# Patient Record
Sex: Female | Born: 1950 | Race: Black or African American | Hispanic: No | Marital: Single | Smoking: Never smoker
Health system: Southern US, Community
[De-identification: ages and names within clinical notes are randomized; demographics above are authoritative.]

## PROBLEM LIST (undated history)

## (undated) DIAGNOSIS — E7800 Pure hypercholesterolemia, unspecified: Secondary | ICD-10-CM

## (undated) DIAGNOSIS — N200 Calculus of kidney: Secondary | ICD-10-CM

## (undated) DIAGNOSIS — Z803 Family history of malignant neoplasm of breast: Secondary | ICD-10-CM

## (undated) DIAGNOSIS — Z923 Personal history of irradiation: Secondary | ICD-10-CM

## (undated) DIAGNOSIS — N390 Urinary tract infection, site not specified: Secondary | ICD-10-CM

## (undated) DIAGNOSIS — Z800 Family history of malignant neoplasm of digestive organs: Secondary | ICD-10-CM

## (undated) DIAGNOSIS — Z9221 Personal history of antineoplastic chemotherapy: Secondary | ICD-10-CM

## (undated) DIAGNOSIS — I10 Essential (primary) hypertension: Secondary | ICD-10-CM

## (undated) DIAGNOSIS — Z801 Family history of malignant neoplasm of trachea, bronchus and lung: Secondary | ICD-10-CM

## (undated) DIAGNOSIS — C801 Malignant (primary) neoplasm, unspecified: Secondary | ICD-10-CM

## (undated) HISTORY — DX: Family history of malignant neoplasm of digestive organs: Z80.0

## (undated) HISTORY — DX: Family history of malignant neoplasm of breast: Z80.3

## (undated) HISTORY — DX: Urinary tract infection, site not specified: N39.0

## (undated) HISTORY — PX: BREAST SURGERY: SHX581

## (undated) HISTORY — PX: BREAST EXCISIONAL BIOPSY: SUR124

## (undated) HISTORY — DX: Family history of malignant neoplasm of trachea, bronchus and lung: Z80.1

---

## 1998-06-04 ENCOUNTER — Other Ambulatory Visit: Admission: RE | Admit: 1998-06-04 | Discharge: 1998-06-04 | Payer: Self-pay | Admitting: Obstetrics and Gynecology

## 1999-11-04 ENCOUNTER — Encounter: Admission: RE | Admit: 1999-11-04 | Discharge: 1999-11-04 | Payer: Self-pay | Admitting: General Surgery

## 1999-11-04 ENCOUNTER — Encounter (HOSPITAL_BASED_OUTPATIENT_CLINIC_OR_DEPARTMENT_OTHER): Payer: Self-pay | Admitting: General Surgery

## 1999-11-05 ENCOUNTER — Other Ambulatory Visit: Admission: RE | Admit: 1999-11-05 | Discharge: 1999-11-05 | Payer: Self-pay | Admitting: Obstetrics & Gynecology

## 2000-11-05 ENCOUNTER — Encounter (HOSPITAL_BASED_OUTPATIENT_CLINIC_OR_DEPARTMENT_OTHER): Payer: Self-pay | Admitting: General Surgery

## 2000-11-05 ENCOUNTER — Encounter: Admission: RE | Admit: 2000-11-05 | Discharge: 2000-11-05 | Payer: Self-pay | Admitting: General Surgery

## 2000-11-18 ENCOUNTER — Other Ambulatory Visit: Admission: RE | Admit: 2000-11-18 | Discharge: 2000-11-18 | Payer: Self-pay | Admitting: Obstetrics & Gynecology

## 2001-11-09 ENCOUNTER — Encounter (HOSPITAL_BASED_OUTPATIENT_CLINIC_OR_DEPARTMENT_OTHER): Payer: Self-pay | Admitting: General Surgery

## 2001-11-09 ENCOUNTER — Encounter: Admission: RE | Admit: 2001-11-09 | Discharge: 2001-11-09 | Payer: Self-pay | Admitting: General Surgery

## 2001-11-23 ENCOUNTER — Other Ambulatory Visit: Admission: RE | Admit: 2001-11-23 | Discharge: 2001-11-23 | Payer: Self-pay | Admitting: Obstetrics & Gynecology

## 2002-11-18 ENCOUNTER — Encounter (HOSPITAL_BASED_OUTPATIENT_CLINIC_OR_DEPARTMENT_OTHER): Payer: Self-pay | Admitting: General Surgery

## 2002-11-18 ENCOUNTER — Encounter: Admission: RE | Admit: 2002-11-18 | Discharge: 2002-11-18 | Payer: Self-pay | Admitting: General Surgery

## 2002-11-25 ENCOUNTER — Other Ambulatory Visit: Admission: RE | Admit: 2002-11-25 | Discharge: 2002-11-25 | Payer: Self-pay | Admitting: Obstetrics & Gynecology

## 2003-07-14 ENCOUNTER — Encounter: Admission: RE | Admit: 2003-07-14 | Discharge: 2003-07-14 | Payer: Self-pay | Admitting: General Surgery

## 2003-08-04 ENCOUNTER — Encounter: Admission: RE | Admit: 2003-08-04 | Discharge: 2003-08-04 | Payer: Self-pay | Admitting: General Surgery

## 2003-09-01 ENCOUNTER — Encounter: Admission: RE | Admit: 2003-09-01 | Discharge: 2003-09-01 | Payer: Self-pay | Admitting: General Surgery

## 2003-09-04 ENCOUNTER — Ambulatory Visit (HOSPITAL_COMMUNITY): Admission: RE | Admit: 2003-09-04 | Discharge: 2003-09-04 | Payer: Self-pay | Admitting: General Surgery

## 2003-09-04 ENCOUNTER — Ambulatory Visit (HOSPITAL_BASED_OUTPATIENT_CLINIC_OR_DEPARTMENT_OTHER): Admission: RE | Admit: 2003-09-04 | Discharge: 2003-09-04 | Payer: Self-pay | Admitting: General Surgery

## 2003-09-04 ENCOUNTER — Encounter (INDEPENDENT_AMBULATORY_CARE_PROVIDER_SITE_OTHER): Payer: Self-pay | Admitting: *Deleted

## 2003-10-20 ENCOUNTER — Ambulatory Visit (HOSPITAL_BASED_OUTPATIENT_CLINIC_OR_DEPARTMENT_OTHER): Admission: RE | Admit: 2003-10-20 | Discharge: 2003-10-20 | Payer: Self-pay | Admitting: General Surgery

## 2003-10-20 ENCOUNTER — Encounter (INDEPENDENT_AMBULATORY_CARE_PROVIDER_SITE_OTHER): Payer: Self-pay | Admitting: *Deleted

## 2003-10-20 ENCOUNTER — Ambulatory Visit (HOSPITAL_COMMUNITY): Admission: RE | Admit: 2003-10-20 | Discharge: 2003-10-20 | Payer: Self-pay | Admitting: General Surgery

## 2003-11-28 ENCOUNTER — Other Ambulatory Visit: Admission: RE | Admit: 2003-11-28 | Discharge: 2003-11-28 | Payer: Self-pay | Admitting: Obstetrics & Gynecology

## 2004-02-29 ENCOUNTER — Encounter: Admission: RE | Admit: 2004-02-29 | Discharge: 2004-02-29 | Payer: Self-pay | Admitting: General Surgery

## 2004-11-13 ENCOUNTER — Other Ambulatory Visit: Admission: RE | Admit: 2004-11-13 | Discharge: 2004-11-13 | Payer: Self-pay | Admitting: Obstetrics & Gynecology

## 2005-03-27 ENCOUNTER — Encounter: Admission: RE | Admit: 2005-03-27 | Discharge: 2005-03-27 | Payer: Self-pay | Admitting: General Surgery

## 2006-04-14 ENCOUNTER — Encounter: Admission: RE | Admit: 2006-04-14 | Discharge: 2006-04-14 | Payer: Self-pay | Admitting: General Surgery

## 2006-11-05 ENCOUNTER — Emergency Department (HOSPITAL_COMMUNITY): Admission: EM | Admit: 2006-11-05 | Discharge: 2006-11-05 | Payer: Self-pay | Admitting: Emergency Medicine

## 2006-11-09 ENCOUNTER — Ambulatory Visit: Payer: Self-pay | Admitting: *Deleted

## 2006-11-09 ENCOUNTER — Encounter (INDEPENDENT_AMBULATORY_CARE_PROVIDER_SITE_OTHER): Payer: Self-pay | Admitting: Family Medicine

## 2006-11-09 ENCOUNTER — Ambulatory Visit: Payer: Self-pay | Admitting: Internal Medicine

## 2006-11-09 LAB — CONVERTED CEMR LAB
ALT: 27 units/L (ref 0–35)
Alkaline Phosphatase: 86 units/L (ref 39–117)
Basophils Absolute: 0.1 10*3/uL (ref 0.0–0.1)
CO2: 25 meq/L (ref 19–32)
Creatinine, Ser: 0.71 mg/dL (ref 0.40–1.20)
Eosinophils Absolute: 0.2 10*3/uL (ref 0.0–0.7)
Eosinophils Relative: 2 % (ref 0–5)
Glucose, Bld: 93 mg/dL (ref 70–99)
HCT: 40.4 % (ref 36.0–46.0)
Hemoglobin: 12.5 g/dL (ref 12.0–15.0)
Lymphocytes Relative: 42 % (ref 12–46)
MCHC: 30.9 g/dL (ref 30.0–36.0)
MCV: 88.8 fL (ref 78.0–100.0)
Monocytes Absolute: 0.5 10*3/uL (ref 0.2–0.7)
Platelets: 214 10*3/uL (ref 150–400)
RDW: 14 % (ref 11.5–14.0)
Sodium: 144 meq/L (ref 135–145)
TSH: 1.232 microintl units/mL (ref 0.350–5.50)
Total Bilirubin: 0.4 mg/dL (ref 0.3–1.2)
Total Protein: 8.1 g/dL (ref 6.0–8.3)

## 2007-04-16 ENCOUNTER — Ambulatory Visit (HOSPITAL_COMMUNITY): Admission: RE | Admit: 2007-04-16 | Discharge: 2007-04-16 | Payer: Self-pay | Admitting: Obstetrics & Gynecology

## 2007-05-11 ENCOUNTER — Encounter (INDEPENDENT_AMBULATORY_CARE_PROVIDER_SITE_OTHER): Payer: Self-pay | Admitting: Family Medicine

## 2007-05-11 ENCOUNTER — Ambulatory Visit: Payer: Self-pay | Admitting: Internal Medicine

## 2007-05-11 LAB — CONVERTED CEMR LAB
ALT: 20 units/L (ref 0–35)
CO2: 26 meq/L (ref 19–32)
Calcium: 9.8 mg/dL (ref 8.4–10.5)
Chloride: 104 meq/L (ref 96–112)
Cholesterol: 206 mg/dL — ABNORMAL HIGH (ref 0–200)
Glucose, Bld: 126 mg/dL — ABNORMAL HIGH (ref 70–99)
Sodium: 143 meq/L (ref 135–145)
Total Protein: 7.9 g/dL (ref 6.0–8.3)
Triglycerides: 204 mg/dL — ABNORMAL HIGH (ref <150)

## 2007-06-02 ENCOUNTER — Encounter (INDEPENDENT_AMBULATORY_CARE_PROVIDER_SITE_OTHER): Payer: Self-pay | Admitting: Family Medicine

## 2007-06-02 ENCOUNTER — Ambulatory Visit: Payer: Self-pay | Admitting: Internal Medicine

## 2007-06-02 LAB — CONVERTED CEMR LAB
BUN: 12 mg/dL (ref 6–23)
CO2: 24 meq/L (ref 19–32)
Chloride: 102 meq/L (ref 96–112)
Creatinine, Ser: 0.75 mg/dL (ref 0.40–1.20)
Glucose, Bld: 130 mg/dL — ABNORMAL HIGH (ref 70–99)
Potassium: 3.9 meq/L (ref 3.5–5.3)

## 2007-06-14 ENCOUNTER — Ambulatory Visit: Payer: Self-pay | Admitting: Internal Medicine

## 2007-07-26 ENCOUNTER — Encounter (INDEPENDENT_AMBULATORY_CARE_PROVIDER_SITE_OTHER): Payer: Self-pay | Admitting: Family Medicine

## 2007-07-26 ENCOUNTER — Ambulatory Visit: Payer: Self-pay | Admitting: Internal Medicine

## 2007-07-26 LAB — CONVERTED CEMR LAB
AST: 15 units/L (ref 0–37)
Albumin: 4.4 g/dL (ref 3.5–5.2)
Alkaline Phosphatase: 79 units/L (ref 39–117)
BUN: 10 mg/dL (ref 6–23)
Creatinine, Ser: 0.74 mg/dL (ref 0.40–1.20)
Glucose, Bld: 91 mg/dL (ref 70–99)
HDL: 38 mg/dL — ABNORMAL LOW (ref >39)
LDL Cholesterol: 88 mg/dL (ref 0–99)
Total Bilirubin: 0.5 mg/dL (ref 0.3–1.2)
Total CHOL/HDL Ratio: 4.3
Triglycerides: 186 mg/dL — ABNORMAL HIGH (ref <150)
VLDL: 37 mg/dL (ref 0–40)

## 2007-08-16 ENCOUNTER — Ambulatory Visit: Payer: Self-pay | Admitting: Internal Medicine

## 2007-08-16 ENCOUNTER — Encounter (INDEPENDENT_AMBULATORY_CARE_PROVIDER_SITE_OTHER): Payer: Self-pay | Admitting: Family Medicine

## 2007-08-16 LAB — CONVERTED CEMR LAB
CO2: 27 meq/L (ref 19–32)
Calcium: 9.7 mg/dL (ref 8.4–10.5)
Cholesterol: 153 mg/dL (ref 0–200)
Creatinine, Ser: 0.75 mg/dL (ref 0.40–1.20)
HDL: 41 mg/dL (ref >39)

## 2007-09-29 ENCOUNTER — Emergency Department (HOSPITAL_COMMUNITY): Admission: EM | Admit: 2007-09-29 | Discharge: 2007-09-29 | Payer: Self-pay | Admitting: Emergency Medicine

## 2007-10-15 ENCOUNTER — Ambulatory Visit: Payer: Self-pay | Admitting: Internal Medicine

## 2007-12-03 ENCOUNTER — Ambulatory Visit: Payer: Self-pay | Admitting: Family Medicine

## 2007-12-03 LAB — CONVERTED CEMR LAB
Albumin: 4.5 g/dL (ref 3.5–5.2)
BUN: 16 mg/dL (ref 6–23)
CO2: 24 meq/L (ref 19–32)
Glucose, Bld: 139 mg/dL — ABNORMAL HIGH (ref 70–99)
Sodium: 140 meq/L (ref 135–145)
Total Bilirubin: 0.3 mg/dL (ref 0.3–1.2)
Total Protein: 7.5 g/dL (ref 6.0–8.3)

## 2008-01-25 ENCOUNTER — Emergency Department (HOSPITAL_COMMUNITY): Admission: EM | Admit: 2008-01-25 | Discharge: 2008-01-25 | Payer: Self-pay | Admitting: Emergency Medicine

## 2008-04-04 ENCOUNTER — Emergency Department (HOSPITAL_COMMUNITY): Admission: EM | Admit: 2008-04-04 | Discharge: 2008-04-04 | Payer: Self-pay | Admitting: Emergency Medicine

## 2008-04-14 ENCOUNTER — Emergency Department (HOSPITAL_COMMUNITY): Admission: EM | Admit: 2008-04-14 | Discharge: 2008-04-14 | Payer: Self-pay | Admitting: Family Medicine

## 2008-04-17 ENCOUNTER — Ambulatory Visit (HOSPITAL_COMMUNITY): Admission: RE | Admit: 2008-04-17 | Discharge: 2008-04-17 | Payer: Self-pay | Admitting: Obstetrics & Gynecology

## 2008-05-04 ENCOUNTER — Ambulatory Visit: Payer: Self-pay | Admitting: Family Medicine

## 2008-05-10 ENCOUNTER — Ambulatory Visit: Payer: Self-pay | Admitting: Family Medicine

## 2008-05-11 ENCOUNTER — Encounter (INDEPENDENT_AMBULATORY_CARE_PROVIDER_SITE_OTHER): Payer: Self-pay | Admitting: Adult Health

## 2008-05-24 ENCOUNTER — Ambulatory Visit: Payer: Self-pay | Admitting: Internal Medicine

## 2008-11-02 ENCOUNTER — Ambulatory Visit: Payer: Self-pay | Admitting: Internal Medicine

## 2008-11-02 ENCOUNTER — Encounter (INDEPENDENT_AMBULATORY_CARE_PROVIDER_SITE_OTHER): Payer: Self-pay | Admitting: Adult Health

## 2008-11-02 LAB — CONVERTED CEMR LAB
ALT: 16 U/L
AST: 14 U/L
Albumin: 4.2 g/dL
Alkaline Phosphatase: 89 U/L
BUN: 13 mg/dL
Basophils Absolute: 0.1 K/uL
Basophils Relative: 1 %
CO2: 24 meq/L
Calcium: 9.8 mg/dL
Chloride: 107 meq/L
Creatinine, Ser: 0.79 mg/dL
Eosinophils Absolute: 0.1 K/uL
Eosinophils Relative: 2 %
Glucose, Bld: 117 mg/dL — ABNORMAL HIGH
HCT: 35.5 % — ABNORMAL LOW
Hemoglobin: 10.9 g/dL — ABNORMAL LOW
Lymphocytes Relative: 30 %
Lymphs Abs: 2.5 K/uL
MCHC: 30.7 g/dL
MCV: 88.1 fL
Microalb, Ur: 1.35 mg/dL
Monocytes Absolute: 0.6 K/uL
Monocytes Relative: 7 %
Neutro Abs: 5.2 K/uL
Neutrophils Relative %: 61 %
Platelets: 217 K/uL
Potassium: 3 meq/L — ABNORMAL LOW
RBC: 4.03 M/uL
RDW: 13.6 %
Sodium: 145 meq/L
Total Bilirubin: 0.3 mg/dL
Total Protein: 7.5 g/dL
WBC: 8.5 10*3/microliter

## 2008-11-09 ENCOUNTER — Ambulatory Visit: Payer: Self-pay | Admitting: Internal Medicine

## 2008-11-30 ENCOUNTER — Encounter (INDEPENDENT_AMBULATORY_CARE_PROVIDER_SITE_OTHER): Payer: Self-pay | Admitting: Adult Health

## 2008-11-30 ENCOUNTER — Ambulatory Visit: Payer: Self-pay | Admitting: Internal Medicine

## 2008-11-30 LAB — CONVERTED CEMR LAB
CO2: 23 meq/L (ref 19–32)
Calcium: 10.2 mg/dL (ref 8.4–10.5)
Creatinine, Ser: 0.85 mg/dL (ref 0.40–1.20)
HDL: 38 mg/dL — ABNORMAL LOW (ref >39)
Triglycerides: 109 mg/dL (ref <150)

## 2009-02-01 ENCOUNTER — Ambulatory Visit: Payer: Self-pay | Admitting: Internal Medicine

## 2009-02-02 ENCOUNTER — Encounter (INDEPENDENT_AMBULATORY_CARE_PROVIDER_SITE_OTHER): Payer: Self-pay | Admitting: Adult Health

## 2009-03-26 ENCOUNTER — Ambulatory Visit: Payer: Self-pay | Admitting: Internal Medicine

## 2009-03-29 ENCOUNTER — Ambulatory Visit: Payer: Self-pay | Admitting: Internal Medicine

## 2009-04-20 ENCOUNTER — Ambulatory Visit (HOSPITAL_COMMUNITY): Admission: RE | Admit: 2009-04-20 | Discharge: 2009-04-20 | Payer: Self-pay | Admitting: Family Medicine

## 2009-05-04 ENCOUNTER — Encounter (INDEPENDENT_AMBULATORY_CARE_PROVIDER_SITE_OTHER): Payer: Self-pay | Admitting: Adult Health

## 2009-05-04 ENCOUNTER — Ambulatory Visit: Payer: Self-pay | Admitting: Internal Medicine

## 2009-05-04 LAB — CONVERTED CEMR LAB
ALT: 20 units/L (ref 0–35)
Albumin: 4.5 g/dL (ref 3.5–5.2)
CO2: 26 meq/L (ref 19–32)
Calcium: 10.3 mg/dL (ref 8.4–10.5)
Chloride: 104 meq/L (ref 96–112)
Cholesterol: 149 mg/dL (ref 0–200)
Creatinine, Ser: 0.9 mg/dL (ref 0.40–1.20)
Total CHOL/HDL Ratio: 4.5
Vit D, 25-Hydroxy: 14 ng/mL — ABNORMAL LOW (ref 30–89)

## 2010-03-15 ENCOUNTER — Encounter (INDEPENDENT_AMBULATORY_CARE_PROVIDER_SITE_OTHER): Payer: Self-pay | Admitting: *Deleted

## 2010-03-15 LAB — CONVERTED CEMR LAB
ALT: 14 units/L (ref 0–35)
BUN: 12 mg/dL (ref 6–23)
CO2: 26 meq/L (ref 19–32)
Chloride: 105 meq/L (ref 96–112)
Glucose, Bld: 129 mg/dL — ABNORMAL HIGH (ref 70–99)
Potassium: 3.7 meq/L (ref 3.5–5.3)

## 2010-03-17 ENCOUNTER — Encounter (HOSPITAL_BASED_OUTPATIENT_CLINIC_OR_DEPARTMENT_OTHER): Payer: Self-pay | Admitting: General Surgery

## 2010-05-18 ENCOUNTER — Inpatient Hospital Stay (INDEPENDENT_AMBULATORY_CARE_PROVIDER_SITE_OTHER)
Admission: RE | Admit: 2010-05-18 | Discharge: 2010-05-18 | Disposition: A | Discharge status: Home / Self Care | Payer: Self-pay | Source: Ambulatory Visit | Attending: Emergency Medicine | Admitting: Emergency Medicine

## 2010-05-18 DIAGNOSIS — R05 Cough: Secondary | ICD-10-CM

## 2010-05-20 ENCOUNTER — Other Ambulatory Visit (HOSPITAL_COMMUNITY): Payer: Self-pay | Admitting: Family Medicine

## 2010-05-20 DIAGNOSIS — Z1231 Encounter for screening mammogram for malignant neoplasm of breast: Secondary | ICD-10-CM

## 2010-05-23 ENCOUNTER — Ambulatory Visit (HOSPITAL_COMMUNITY)
Admission: RE | Admit: 2010-05-23 | Discharge: 2010-05-23 | Disposition: A | Discharge status: Home / Self Care | Payer: Self-pay | Source: Ambulatory Visit | Attending: Family Medicine | Admitting: Family Medicine

## 2010-05-23 DIAGNOSIS — Z1231 Encounter for screening mammogram for malignant neoplasm of breast: Secondary | ICD-10-CM | POA: Insufficient documentation

## 2010-06-11 LAB — URINALYSIS, ROUTINE W REFLEX MICROSCOPIC
Bilirubin Urine: NEGATIVE
Glucose, UA: NEGATIVE mg/dL
Ketones, ur: NEGATIVE mg/dL
pH: 6.5 (ref 5.0–8.0)

## 2010-06-11 LAB — URINE CULTURE

## 2010-06-11 LAB — URINE MICROSCOPIC-ADD ON

## 2010-06-11 LAB — POCT URINALYSIS DIP (DEVICE)
Bilirubin Urine: NEGATIVE
Ketones, ur: NEGATIVE mg/dL
Urobilinogen, UA: 0.2 mg/dL (ref 0.0–1.0)

## 2010-06-20 ENCOUNTER — Inpatient Hospital Stay (INDEPENDENT_AMBULATORY_CARE_PROVIDER_SITE_OTHER)
Admission: RE | Admit: 2010-06-20 | Discharge: 2010-06-20 | Disposition: A | Discharge status: Home / Self Care | Payer: Self-pay | Source: Ambulatory Visit | Attending: Emergency Medicine | Admitting: Emergency Medicine

## 2010-06-20 ENCOUNTER — Ambulatory Visit (INDEPENDENT_AMBULATORY_CARE_PROVIDER_SITE_OTHER): Payer: Self-pay

## 2010-06-20 DIAGNOSIS — R05 Cough: Secondary | ICD-10-CM

## 2010-07-12 NOTE — Op Note (Signed)
NAME:  Emily Chambers, Emily Chambers                         ACCOUNT NO.:  [id number]   MEDICAL RECORD NO.:  [id number]                   PATIENT TYPE:  AMB   LOCATION:  DSC                                  FACILITY:  [facility]   PHYSICIAN:  Kathrin Penner, M.D.                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   PROCEDURE:  Major duct excision of the left breast.   SURGEON:  Saralyn Pilar L. Bubba Camp, M.D.   ASSISTANT:  Nurse   ANESTHESIA:  General.   NOTE:  The patient is a 60 year old woman with a persistent left sided  nipple discharge with an associated long standing left nipple inversion.  Her mammograms have been normal, but she continues to have a nipple  discharge from this area.  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  The discharge  that she has been having has been clear and heme negative.  She comes to the  operating room now after the risks and and potential benefits of surgery  have been discussed, all questions answered, and consent obtained.   PROCEDURE:  Following the induction of satisfactory general anesthesia, the  patient was positioned supine.  The left breast was prepped and draped to be  included in the sterile operative field.  The inverted nipple was everted  and the dilated duct is cannulated with a lacrimal duct probe.  The nipple  is then held up and an elliptical incision carried down around from the  nipple across down to the areolar border.  This elliptical incision is  carried down around the dilated duct system for approximately 7 cm down into  the depths of the breast where it is transected and removed and forwarded  for pathological evaluation.   Hemostasis was obtained within the breast with  electrocautery.  Sponge and instrument counts were verified.  The breast  tissues were reapproximated with 2-0 Vicryl sutures, the subcutaneous  tissues were closed with interrupted 3-0 Vicryl sutures, and the skin was  closed with running 5-0  Monocryl suture, thereby, reconstructing the nipple areolar complex.  Steri-  Strips were used to reinforce the wound.  Sterile dressings were applied.  The anesthetic was reversed and the patient removed from the operating room  to the recovery room in stable condition.  She tolerated the procedure well.                                               Kathrin Penner, M.D.    PB/MEDQ  D:  09/04/2003  T:  09/04/2003  Job:  [id number]   cc:   Modena Jansky. Marisue Humble, M.D.  [place] [place]  [place] [address]  Fax: [phone]

## 2010-07-12 NOTE — Op Note (Signed)
NAME:  Emily Chambers, Emily Chambers                         ACCOUNT NO.:  [id number]   MEDICAL RECORD NO.:  [id number]                   PATIENT TYPE:  AMB   LOCATION:  DSC                                  FACILITY:  [facility]   PHYSICIAN:  Kathrin Penner, M.D.                DATE OF BIRTH:  07/09/1950   DATE OF PROCEDURE:  DATE OF DISCHARGE:                                 OPERATIVE REPORT   DATE OF OPERATION:  October 20, 2003.   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  Pathology is pending.   PROCEDURE:  Re-excision of major duct system with margins of the left  breast.   SURGEON:  Kathrin Penner, MD.   ASSISTANT:  Nurse.   ANESTHESIA:  General.   HISTORY:  Emily Chambers is a 60 year old woman, who originally presented with  a left-sided nipple discharge, and on ductogram was noted to have  papillomatosis.  She was taken to the operating room prior to this for a  local excision of a major duct off the left breast.  This subsequent  specimen showed significant amounts of atypical ductal hyperplasia and could  not rule out the presence of a carcinoma.  The patient returns to the  operating room now for a re-excision of the subareolar major duct system.  She understands the risks and potential benefits of surgery and gives her  consent to this procedure.   PROCEDURE:  Following the induction of satisfactory general anesthesia, the  patient is positioned supine and the left breast is prepped and draped to be  included in the sterile operative field.  A circumareolar incision carried  down on the inferior border of the nipple was deepened through the skin and  subcutaneous tissues, and the nipple was raised as a superiorly-based flap,  taking it up so as to expose the entire subareolar region.  This region  is  then widely excised, carrying it down through approximately 5 cm into the  depth of the breast and about 2.5 cm in width.  The entire major duct system  is thereby removed and forwarded for pathologic evaluation.  Hemostasis is  obtained with electrocautery.  The breast tissues were then reapproximated  with interrupted 2-0 Vicryl sutures.  The nipple is laid back down and  sutured down to the subcutaneous tissues.  A small button hole in the nipple  tissue was repaired with  a 5-0 Monocryl suture.  The skin was closed with 5-0 Monocryl.  Steri-Strips  were applied, and compressive dressings were applied.  The patient then  removed from the operating room to the recovery room in stable condition.  She tolerated the procedure well.  Kathrin Penner, M.D.    PB/MEDQ  D:  10/20/2003  T:  10/20/2003  Job:  [id number]   cc:   2 copies to Dr. Gardiner Barefoot office

## 2010-08-26 ENCOUNTER — Other Ambulatory Visit (HOSPITAL_COMMUNITY)
Admission: RE | Admit: 2010-08-26 | Discharge: 2010-08-26 | Disposition: A | Discharge status: Home / Self Care | Payer: Self-pay | Source: Ambulatory Visit | Attending: Internal Medicine | Admitting: Internal Medicine

## 2010-08-26 ENCOUNTER — Other Ambulatory Visit: Payer: Self-pay | Admitting: Family Medicine

## 2010-08-26 DIAGNOSIS — Z01419 Encounter for gynecological examination (general) (routine) without abnormal findings: Secondary | ICD-10-CM | POA: Insufficient documentation

## 2010-11-22 LAB — DIFFERENTIAL
Basophils Absolute: 0
Basophils Relative: 0
Eosinophils Absolute: 0
Eosinophils Relative: 0
Lymphocytes Relative: 21
Lymphs Abs: 2.4
Monocytes Absolute: 0.5
Monocytes Relative: 4
Neutro Abs: 8.5 — ABNORMAL HIGH
Neutrophils Relative %: 74

## 2010-11-22 LAB — POCT URINALYSIS DIP (DEVICE)
Nitrite: NEGATIVE
Urobilinogen, UA: 0.2
pH: 5.5

## 2010-11-22 LAB — COMPREHENSIVE METABOLIC PANEL
ALT: 22
AST: 22
Albumin: 3.9
Alkaline Phosphatase: 80
BUN: 9
CO2: 25
Calcium: 9.5
Chloride: 109
Creatinine, Ser: 0.89
GFR calc Af Amer: >60
GFR calc non Af Amer: >60
Glucose, Bld: 151 — ABNORMAL HIGH
Potassium: 3.2 — ABNORMAL LOW
Sodium: 143
Total Bilirubin: 0.5
Total Protein: 7.5

## 2010-11-22 LAB — CBC
HCT: 37.6
Hemoglobin: 12
MCHC: 31.8
MCV: 87.6
Platelets: 198
RBC: 4.3
RDW: 13.3
WBC: 11.5 — ABNORMAL HIGH

## 2010-11-29 LAB — POCT URINALYSIS DIP (DEVICE)
Bilirubin Urine: NEGATIVE
Nitrite: NEGATIVE
Protein, ur: NEGATIVE mg/dL
Urobilinogen, UA: 0.2 mg/dL (ref 0.0–1.0)
pH: 5.5 (ref 5.0–8.0)

## 2011-01-11 ENCOUNTER — Emergency Department (HOSPITAL_COMMUNITY)
Admission: EM | Admit: 2011-01-11 | Discharge: 2011-01-11 | Disposition: A | Discharge status: Home / Self Care | Payer: Self-pay | Source: Home / Self Care | Attending: Family Medicine | Admitting: Family Medicine

## 2011-01-11 ENCOUNTER — Encounter: Payer: Self-pay | Admitting: *Deleted

## 2011-01-11 DIAGNOSIS — N390 Urinary tract infection, site not specified: Secondary | ICD-10-CM

## 2011-01-11 HISTORY — DX: Pure hypercholesterolemia, unspecified: E78.00

## 2011-01-11 HISTORY — DX: Calculus of kidney: N20.0

## 2011-01-11 HISTORY — DX: Essential (primary) hypertension: I10

## 2011-01-11 LAB — POCT URINALYSIS DIP (DEVICE)
Protein, ur: NEGATIVE mg/dL
Urobilinogen, UA: 0.2 mg/dL (ref 0.0–1.0)

## 2011-01-11 MED ORDER — CEPHALEXIN 500 MG PO CAPS: 500.0000 mg | ORAL_CAPSULE | Freq: Four times a day (QID) | ORAL | Status: AC

## 2011-01-11 NOTE — ED Provider Notes (Signed)
History     CSN: [id number] Arrival date & time: 01/11/2011  1:19 PM   First MD Initiated Contact with Patient 01/11/11 1102      Chief Complaint  Patient presents with  . Urinary Frequency    pt with more frequent urination x 2 weeks -dark colored urine and low back discomfort     (Consider location/radiation/quality/duration/timing/severity/associated sxs/prior treatment) Patient is a 60 y.o. female presenting with frequency. The history is provided by the patient.  Urinary Frequency The current episode started more than 1 week ago (h/o kidney stone). The problem has not changed since onset.Pertinent negatives include no abdominal pain. The symptoms are relieved by nothing. She has tried nothing for the symptoms.    Past Medical History  Diagnosis Date  . Hypertension   . High cholesterol   . Kidney stone     Past Surgical History  Procedure Date  . Breast surgery     History reviewed. No pertinent family history.  History  Substance Use Topics  . Smoking status: Never Smoker   . Smokeless tobacco: Not on file  . Alcohol Use: No    OB History    Grav Para Term Preterm Abortions TAB SAB Ect Mult Living                  Review of Systems  Constitutional: Negative.   Gastrointestinal: Negative.  Negative for abdominal pain.  Genitourinary: Positive for dysuria, urgency and frequency. Negative for vaginal bleeding, vaginal discharge and vaginal pain.    Allergies  Review of patient's allergies indicates no known allergies.  Home Medications   Current Outpatient Rx  Name Route Sig Dispense Refill  . ASPIRIN 81 MG PO TABS Oral Take 81 mg by mouth daily.      Marland Kitchen PRESCRIPTION MEDICATION  Unable to give names of med takes htn and high cholesterol med       BP 184/98  Pulse 76  Temp(Src) 98.4 F (36.9 C) (Oral)  Resp 16  SpO2 98%  Physical Exam  Nursing note and vitals reviewed. Constitutional: She appears well-developed and well-nourished.    Abdominal: Soft. Normal appearance and bowel sounds are normal. She exhibits no distension and no mass. There is no hepatosplenomegaly. There is tenderness. There is rebound. There is no guarding and no CVA tenderness.    ED Course  Procedures (including critical care time)  Labs Reviewed  POCT URINALYSIS DIP (DEVICE) - Abnormal; Notable for the following:    Hgb urine dipstick TRACE (*)    Leukocytes, UA SMALL (*) Biochemical Testing Only. Please order routine urinalysis from main lab if confirmatory testing is needed.   All other components within normal limits  POCT URINALYSIS DIPSTICK   No results found.   No diagnosis found.    MDM  See u/a report.        Pauline Good, MD 01/11/11 1351

## 2011-04-14 ENCOUNTER — Other Ambulatory Visit (HOSPITAL_COMMUNITY): Payer: Self-pay | Admitting: Family Medicine

## 2011-04-14 DIAGNOSIS — Z1231 Encounter for screening mammogram for malignant neoplasm of breast: Secondary | ICD-10-CM

## 2011-05-26 ENCOUNTER — Ambulatory Visit (HOSPITAL_COMMUNITY)
Admission: RE | Admit: 2011-05-26 | Discharge: 2011-05-26 | Disposition: A | Discharge status: Home / Self Care | Payer: Self-pay | Source: Ambulatory Visit | Attending: Family Medicine | Admitting: Family Medicine

## 2011-05-26 DIAGNOSIS — Z1231 Encounter for screening mammogram for malignant neoplasm of breast: Secondary | ICD-10-CM | POA: Insufficient documentation

## 2011-06-12 ENCOUNTER — Emergency Department (INDEPENDENT_AMBULATORY_CARE_PROVIDER_SITE_OTHER)
Admission: EM | Admit: 2011-06-12 | Discharge: 2011-06-12 | Disposition: A | Discharge status: Home / Self Care | Payer: Self-pay | Source: Home / Self Care | Attending: Family Medicine | Admitting: Family Medicine

## 2011-06-12 ENCOUNTER — Encounter (HOSPITAL_COMMUNITY): Payer: Self-pay | Admitting: Emergency Medicine

## 2011-06-12 DIAGNOSIS — N390 Urinary tract infection, site not specified: Secondary | ICD-10-CM

## 2011-06-12 HISTORY — DX: Calculus of kidney: N20.0

## 2011-06-12 LAB — POCT URINALYSIS DIP (DEVICE)
Glucose, UA: NEGATIVE mg/dL
Nitrite: POSITIVE — AB
Urobilinogen, UA: 0.2 mg/dL (ref 0.0–1.0)

## 2011-06-12 MED ORDER — ONDANSETRON 4 MG PO TBDP: 4.0000 mg | ORAL_TABLET | Freq: Three times a day (TID) | ORAL | Status: AC | PRN

## 2011-06-12 MED ORDER — SULFAMETHOXAZOLE-TRIMETHOPRIM 800-160 MG PO TABS: 1.0000 | ORAL_TABLET | Freq: Two times a day (BID) | ORAL | Status: AC

## 2011-06-12 MED ORDER — SULFAMETHOXAZOLE-TRIMETHOPRIM 800-160 MG PO TABS: 1.0000 | ORAL_TABLET | Freq: Two times a day (BID) | ORAL | Status: DC

## 2011-06-12 NOTE — Discharge Instructions (Signed)

## 2011-06-12 NOTE — ED Notes (Signed)
Reports Tuesday she felt bad and was vomiting.  Also noticed color of urine was not normal, looked like blood in urine per patient.  Denies any vomiting now.  Patient reports urine looks even more like blood in it. Denies urinary pain.  Does reports increase in frequency.  No abdominal pain, does report lower back pain.  Denies fever

## 2011-06-13 LAB — URINE CULTURE

## 2011-06-15 NOTE — ED Provider Notes (Signed)
History     CSN: [id number]  Arrival date & time 06/12/11  [id number]   First MD Initiated Contact with Patient 06/12/11 1904      Chief Complaint  Patient presents with  . Urinary Tract Infection    (Consider location/radiation/quality/duration/timing/severity/associated sxs/prior treatment) HPI Comments: 61 y/o female non diabetic. H/o kidney stones in the past here c/o frequency, nausea and one episode of vomiting 2 days ago. Dark urine and frequency persistent although nausea and vomiting resolved. Denies fever or chills. No flank pain. No burning on urination.   Past Medical History  Diagnosis Date  . Hypertension   . High cholesterol   . Kidney stone   . Kidney stones     Past Surgical History  Procedure Date  . Breast surgery     No family history on file.  History  Substance Use Topics  . Smoking status: Never Smoker   . Smokeless tobacco: Not on file  . Alcohol Use: No    OB History    Grav Para Term Preterm Abortions TAB SAB Ect Mult Living                  Review of Systems  Constitutional: Negative for fever and chills.  Gastrointestinal: Negative for nausea, vomiting and abdominal pain.  Genitourinary: Positive for frequency and hematuria. Negative for flank pain, vaginal bleeding, vaginal discharge and pelvic pain.  Skin: Negative for rash.  Neurological: Negative for dizziness and headaches.    Allergies  Review of patient's allergies indicates no known allergies.  Home Medications   Current Outpatient Rx  Name Route Sig Dispense Refill  . ASPIRIN 81 MG PO TABS Oral Take 81 mg by mouth daily.      Marland Kitchen ONDANSETRON 4 MG PO TBDP Oral Take 1 tablet (4 mg total) by mouth every 8 (eight) hours as needed for nausea. 10 tablet 0  . PRESCRIPTION MEDICATION  Unable to give names of med takes htn and high cholesterol med     . SULFAMETHOXAZOLE-TRIMETHOPRIM 800-160 MG PO TABS Oral Take 1 tablet by mouth 2 (two) times daily. 14 tablet 0    BP 144/69   Pulse 66  Temp(Src) 98.1 F (36.7 C) (Oral)  Resp 16  SpO2 96%  Physical Exam  Nursing note and vitals reviewed. Constitutional: She is oriented to person, place, and time. She appears well-developed and well-nourished. No distress.  HENT:  Mouth/Throat: Oropharynx is clear and moist.  Eyes: Conjunctivae are normal. Pupils are equal, round, and reactive to light.  Neck: Neck supple.  Cardiovascular: Normal heart sounds.   Pulmonary/Chest: Breath sounds normal.  Abdominal: Soft. She exhibits no mass. There is no tenderness. There is no rebound and no guarding.       No CVT  Lymphadenopathy:    She has no cervical adenopathy.  Neurological: She is alert and oriented to person, place, and time.  Skin: No rash noted.    ED Course  Procedures (including critical care time)  Labs Reviewed  POCT URINALYSIS DIP (DEVICE) - Abnormal; Notable for the following:    Bilirubin Urine SMALL (*)    Hgb urine dipstick LARGE (*)    Protein, ur 100 (*)    Nitrite POSITIVE (*)    Leukocytes, UA TRACE (*) Biochemical Testing Only. Please order routine urinalysis from main lab if confirmatory testing is needed.   All other components within normal limits  URINE CULTURE  LAB REPORT - SCANNED   No results found.   1.  UTI (lower urinary tract infection)       MDM  Is likely pt. Has passed a kidney stone. Has UTI symptoms with UA positive for nit, le and blood. Treated with septra. Asked to follow up with pcp after treatment or go to the ED if recurrent or worsening symptoms despite following treatment. Urine culture pending.        Randa Spike, MD 06/15/11 [phone]

## 2011-07-17 ENCOUNTER — Emergency Department (HOSPITAL_COMMUNITY)
Admission: EM | Admit: 2011-07-17 | Discharge: 2011-07-17 | Disposition: A | Discharge status: Home / Self Care | Payer: Self-pay | Attending: Emergency Medicine | Admitting: Emergency Medicine

## 2011-07-17 DIAGNOSIS — R319 Hematuria, unspecified: Secondary | ICD-10-CM | POA: Insufficient documentation

## 2011-07-17 DIAGNOSIS — M545 Low back pain, unspecified: Secondary | ICD-10-CM | POA: Insufficient documentation

## 2011-07-17 DIAGNOSIS — N390 Urinary tract infection, site not specified: Secondary | ICD-10-CM | POA: Insufficient documentation

## 2011-07-17 DIAGNOSIS — E7800 Pure hypercholesterolemia, unspecified: Secondary | ICD-10-CM | POA: Insufficient documentation

## 2011-07-17 DIAGNOSIS — I10 Essential (primary) hypertension: Secondary | ICD-10-CM | POA: Insufficient documentation

## 2011-07-17 LAB — URINALYSIS, ROUTINE W REFLEX MICROSCOPIC
Bilirubin Urine: NEGATIVE
Nitrite: NEGATIVE
Specific Gravity, Urine: 1.025 (ref 1.005–1.030)
pH: 5.5 (ref 5.0–8.0)

## 2011-07-17 LAB — URINE MICROSCOPIC-ADD ON

## 2011-07-17 MED ORDER — CIPROFLOXACIN HCL 500 MG PO TABS: 500.0000 mg | ORAL_TABLET | Freq: Two times a day (BID) | ORAL | Status: AC

## 2011-07-17 NOTE — Discharge Instructions (Signed)

## 2011-07-17 NOTE — ED Notes (Signed)
Bloody urine since last pm no pain.  [facility] for the same complaint

## 2011-07-17 NOTE — ED Provider Notes (Signed)
History     CSN: [id number]  Arrival date & time 07/17/11  1713   First MD Initiated Contact with Patient 07/17/11 1809      Chief Complaint  Patient presents with  . Hematuria    (Consider location/radiation/quality/duration/timing/severity/associated sxs/prior treatment) HPI Comments: Emily Chambers is a 61 y.o. Female who has had hematuria several times since last night. She denies dysuria, urinary frequency, fever or chills, nausea or vomiting. She has mild, low back pain. She's had this problem twice in the past. She has not seen her doctor recently. She's not tried a medication for the problem. There are no aggravating or ameliorating measures  Patient is a 61 y.o. female presenting with hematuria. The history is provided by the patient.  Hematuria    Past Medical History  Diagnosis Date  . Hypertension   . High cholesterol   . Kidney stone   . Kidney stones     Past Surgical History  Procedure Date  . Breast surgery     No family history on file.  History  Substance Use Topics  . Smoking status: Never Smoker   . Smokeless tobacco: Not on file  . Alcohol Use: No    OB History    Grav Para Term Preterm Abortions TAB SAB Ect Mult Living                  Review of Systems  Genitourinary: Positive for hematuria.  All other systems reviewed and are negative.    Allergies  Review of patient's allergies indicates no known allergies.  Home Medications   Current Outpatient Rx  Name Route Sig Dispense Refill  . AMLODIPINE BESYLATE 10 MG PO TABS Oral Take 10 mg by mouth daily.    . ASPIRIN 81 MG PO CHEW Oral Chew 81 mg by mouth daily.    Marland Kitchen LOSARTAN POTASSIUM 100 MG PO TABS Oral Take 100 mg by mouth daily.    . ADULT MULTIVITAMIN W/MINERALS CH Oral Take 1 tablet by mouth daily.    Marland Kitchen PRAVASTATIN SODIUM 20 MG PO TABS Oral Take 20 mg by mouth every evening.    Marland Kitchen CIPROFLOXACIN HCL 500 MG PO TABS Oral Take 1 tablet (500 mg total) by mouth every 12 (twelve)  hours. 10 tablet 0    BP 126/66  Pulse 85  Temp(Src) 98.3 F (36.8 C) (Oral)  Resp 20  Physical Exam  Nursing note and vitals reviewed. Constitutional: She is oriented to person, place, and time. She appears well-developed and well-nourished.  HENT:  Head: Normocephalic and atraumatic.  Eyes: Conjunctivae and EOM are normal. Pupils are equal, round, and reactive to light.  Neck: Normal range of motion and phonation normal. Neck supple.  Cardiovascular: Normal rate, regular rhythm and intact distal pulses.   Pulmonary/Chest: Effort normal and breath sounds normal. She exhibits no tenderness.  Abdominal: Soft. She exhibits no distension. There is no tenderness. There is no guarding.  Genitourinary:       No costal vertebral angle tenderness  Musculoskeletal: Normal range of motion.  Neurological: She is alert and oriented to person, place, and time. She has normal strength. She exhibits normal muscle tone.  Skin: Skin is warm and dry.  Psychiatric: She has a normal mood and affect. Her behavior is normal. Judgment and thought content normal.    ED Course  Procedures (including critical care time)  Labs Reviewed  URINALYSIS, [place] - Abnormal; Notable for the following:    Color, Urine  BROWN (*) BIOCHEMICALS MAY BE AFFECTED BY COLOR   APPearance TURBID (*)    Hgb urine dipstick LARGE (*)    Ketones, ur 15 (*)    Protein, ur 30 (*)    Leukocytes, UA MODERATE (*)    All other components within normal limits  URINE MICROSCOPIC-ADD ON   No results found.   1. UTI (lower urinary tract infection)       MDM  Likely hemorrhagic cystitis. Recurrent nature of hematuria needs to be investigated further. Doubt metabolic instability, serious bacterial infection or impending vascular collapse; the patient is stable for discharge.  Plan: Home Medications- Cipro; Home Treatments- fluids; Recommended follow up- Urology f/u 1 week      Richarda Blade,  MD 07/17/11 [phone]

## 2011-10-16 ENCOUNTER — Other Ambulatory Visit (HOSPITAL_COMMUNITY): Payer: Self-pay | Admitting: Urology

## 2011-10-16 DIAGNOSIS — R310 Gross hematuria: Secondary | ICD-10-CM

## 2011-10-21 ENCOUNTER — Ambulatory Visit (HOSPITAL_COMMUNITY)
Admission: RE | Admit: 2011-10-21 | Discharge: 2011-10-21 | Disposition: A | Discharge status: Home / Self Care | Payer: Self-pay | Source: Ambulatory Visit | Attending: Urology | Admitting: Urology

## 2011-10-21 DIAGNOSIS — N9489 Other specified conditions associated with female genital organs and menstrual cycle: Secondary | ICD-10-CM | POA: Insufficient documentation

## 2011-10-21 DIAGNOSIS — R310 Gross hematuria: Secondary | ICD-10-CM | POA: Insufficient documentation

## 2011-10-21 DIAGNOSIS — Z853 Personal history of malignant neoplasm of breast: Secondary | ICD-10-CM | POA: Insufficient documentation

## 2011-10-21 DIAGNOSIS — N200 Calculus of kidney: Secondary | ICD-10-CM | POA: Insufficient documentation

## 2011-10-21 DIAGNOSIS — Z87442 Personal history of urinary calculi: Secondary | ICD-10-CM | POA: Insufficient documentation

## 2011-10-21 MED ORDER — IOHEXOL 300 MG/ML  SOLN
125.0000 mL | Freq: Once | INTRAMUSCULAR | Status: AC | PRN
  Administered 2011-10-21: 125 mL via INTRAVENOUS

## 2012-06-28 ENCOUNTER — Other Ambulatory Visit (HOSPITAL_COMMUNITY): Payer: Self-pay | Admitting: Obstetrics & Gynecology

## 2012-06-28 DIAGNOSIS — Z1231 Encounter for screening mammogram for malignant neoplasm of breast: Secondary | ICD-10-CM

## 2012-07-07 ENCOUNTER — Encounter (HOSPITAL_COMMUNITY): Payer: Self-pay | Admitting: Emergency Medicine

## 2012-07-07 ENCOUNTER — Emergency Department (HOSPITAL_COMMUNITY)
Admission: EM | Admit: 2012-07-07 | Discharge: 2012-07-07 | Disposition: A | Discharge status: Home / Self Care | Payer: BC Managed Care – PPO | Source: Home / Self Care | Attending: Emergency Medicine | Admitting: Emergency Medicine

## 2012-07-07 DIAGNOSIS — R8290 Unspecified abnormal findings in urine: Secondary | ICD-10-CM

## 2012-07-07 DIAGNOSIS — R319 Hematuria, unspecified: Secondary | ICD-10-CM

## 2012-07-07 DIAGNOSIS — R82998 Other abnormal findings in urine: Secondary | ICD-10-CM

## 2012-07-07 LAB — URINALYSIS, DIPSTICK ONLY
Ketones, ur: 15 mg/dL — AB
Nitrite: NEGATIVE
Urobilinogen, UA: 0.2 mg/dL (ref 0.0–1.0)
pH: 6 (ref 5.0–8.0)

## 2012-07-07 LAB — POCT URINALYSIS DIP (DEVICE)
Glucose, UA: 100 mg/dL — AB
Ketones, ur: NEGATIVE mg/dL
Specific Gravity, Urine: 1.025 (ref 1.005–1.030)
Urobilinogen, UA: 0.2 mg/dL (ref 0.0–1.0)

## 2012-07-07 MED ORDER — NITROFURANTOIN MONOHYD MACRO 100 MG PO CAPS: 100.0000 mg | ORAL_CAPSULE | Freq: Two times a day (BID) | ORAL | Status: AC

## 2012-07-07 NOTE — ED Notes (Signed)
Pt c/o intermittent hematuria onset 1 year Has seen her GYN and Urologist for the same reason; has had MRI and Ultrasounds but nothing abn was found Denies: dysuria, abd/back pain, f/v/n/d Hx of kidney stones w/o complications She is alert and oriented w/no signs of acute distress.

## 2012-07-07 NOTE — ED Provider Notes (Signed)
History     CSN: [id number]  Arrival date & time 07/07/12  46   First MD Initiated Contact with Patient 07/07/12 1142      Chief Complaint  Patient presents with  . Hematuria    (Consider location/radiation/quality/duration/timing/severity/associated sxs/prior treatment) HPI Comments: Patient presents urgent care describing that for more than a week she's been noticing intermittently blood in her urine. She denies any pain or burning with urination but does describe some mild discomfort at times. She denies any nausea,  vomiting or flank pain. She describes that she has seen the urologist last year because of blood in her urine. Her understanding is it is probably related to the kidney stone that she has right kidney.  Patient denies any constitutional symptoms such as fevers, generalized malaise arthralgias myalgias or unintentional weight loss. No abdominal pain or back pain.  Patient also describes that she underwent a gynecological workup as she had some imaging suggestive of a endometrial abnormality or a fibroma which after some ultrasound studies IT WAS RULED OUT-  Patient is a 62 y.o. female presenting with hematuria and frequency. The history is provided by the patient.  Hematuria This is a recurrent problem. The current episode started more than 1 week ago. The problem occurs constantly. The problem has not changed since onset.Pertinent negatives include no abdominal pain. Exacerbated by: urination. Nothing relieves the symptoms. She has tried nothing for the symptoms. The treatment provided no relief.  Urinary Frequency This is a recurrent problem. The current episode started more than 1 week ago. The problem occurs constantly. Pertinent negatives include no abdominal pain. Nothing relieves the symptoms. She has tried nothing for the symptoms. The treatment provided no relief.    Past Medical History  Diagnosis Date  . Hypertension   . High cholesterol   . Kidney stone    . Kidney stones     Past Surgical History  Procedure Laterality Date  . Breast surgery      History reviewed. No pertinent family history.  History  Substance Use Topics  . Smoking status: Never Smoker   . Smokeless tobacco: Not on file  . Alcohol Use: No    OB History   Grav Para Term Preterm Abortions TAB SAB Ect Mult Living                  Review of Systems  Constitutional: Negative for fever, chills, activity change, appetite change and fatigue.  Gastrointestinal: Negative for nausea, vomiting and abdominal pain.  Genitourinary: Positive for hematuria. Negative for urgency, frequency, flank pain, decreased urine volume, vaginal bleeding, vaginal discharge, genital sores and pelvic pain.  Skin: Negative for color change, pallor, rash and wound.    Allergies  Review of patient's allergies indicates no known allergies.  Home Medications   Current Outpatient Rx  Name  Route  Sig  Dispense  Refill  . amLODipine (NORVASC) 10 MG tablet   Oral   Take 10 mg by mouth daily.         Marland Kitchen aspirin 81 MG chewable tablet   Oral   Chew 81 mg by mouth daily.         Marland Kitchen losartan (COZAAR) 100 MG tablet   Oral   Take 100 mg by mouth daily.         . Multiple Vitamin (MULITIVITAMIN WITH MINERALS) TABS   Oral   Take 1 tablet by mouth daily.         . nitrofurantoin, macrocrystal-monohydrate, (MACROBID) 100  MG capsule   Oral   Take 1 capsule (100 mg total) by mouth 2 (two) times daily.   10 capsule   0   . pravastatin (PRAVACHOL) 20 MG tablet   Oral   Take 20 mg by mouth every evening.           BP 158/93  Pulse 65  Temp(Src) 98.1 F (36.7 C) (Oral)  Resp 18  SpO2 99%  Physical Exam  Nursing note and vitals reviewed. Constitutional: Vital signs are normal. She appears well-developed and well-nourished.  Non-toxic appearance. She does not have a sickly appearance. She does not appear ill.  Neck: Neck supple. No JVD present.  Pulmonary/Chest: Effort  normal.  Abdominal: Soft. Normal appearance. She exhibits no distension and no mass. There is no splenomegaly or hepatomegaly. There is no tenderness. There is no rigidity, no rebound, no guarding, no CVA tenderness, no tenderness at McBurney's point and negative Murphy's sign.  Neurological: She is alert.  Skin: No erythema.  Psychiatric: She has a normal mood and affect.    ED Course  Procedures (including critical care time)  Labs Reviewed  POCT URINALYSIS DIP (DEVICE) - Abnormal; Notable for the following:    Glucose, UA 100 (*)    Bilirubin Urine SMALL (*)    Hgb urine dipstick LARGE (*)    Protein, ur 100 (*)    Nitrite POSITIVE (*)    All other components within normal limits  URINE CULTURE  URINALYSIS, DIPSTICK ONLY   No results found.   1. Hematuria   2. Abnormal urine       MDM  Patient looks comfortable afebrile-abnormal urine dip results at urgent care, sample has been sent for cultures we'll start patient on Macrobid UNTIL urine cultures available. Patient has also been instructed about what symptoms to be watchful for including the presence of any material in your urine a strainer was provided the patient. Have instructed patient to followup with her urologist if blood in her urine persists beyond 2 weeks. She agrees with treatment plan and followup care as necessary.        Rosana Hoes, MD 07/07/12 [phone]

## 2012-07-08 LAB — URINE CULTURE

## 2012-07-15 ENCOUNTER — Ambulatory Visit (HOSPITAL_COMMUNITY)
Admission: RE | Admit: 2012-07-15 | Discharge: 2012-07-15 | Disposition: A | Discharge status: Home / Self Care | Payer: BC Managed Care – PPO | Source: Ambulatory Visit | Attending: Obstetrics & Gynecology | Admitting: Obstetrics & Gynecology

## 2012-07-15 DIAGNOSIS — Z1231 Encounter for screening mammogram for malignant neoplasm of breast: Secondary | ICD-10-CM | POA: Insufficient documentation

## 2012-10-19 ENCOUNTER — Other Ambulatory Visit: Payer: Self-pay | Admitting: Urology

## 2012-10-26 ENCOUNTER — Encounter (HOSPITAL_COMMUNITY): Payer: Self-pay | Admitting: Pharmacy Technician

## 2012-10-27 ENCOUNTER — Encounter (HOSPITAL_COMMUNITY): Payer: Self-pay | Admitting: *Deleted

## 2012-10-27 NOTE — Pre-Procedure Instructions (Signed)
Instructed to read every page in blue folder and fill out history form. No aspirin, herbal meds,vitamins,  advil or any med that is on restricted med list per [facility]. Laxative as directed day prior. Clear liquids MN to 6 am then NPO day of. Bring blue folder to short stay.

## 2012-11-01 ENCOUNTER — Encounter (HOSPITAL_COMMUNITY): Payer: Self-pay | Admitting: General Practice

## 2012-11-01 ENCOUNTER — Ambulatory Visit (HOSPITAL_COMMUNITY)
Admission: RE | Admit: 2012-11-01 | Discharge: 2012-11-01 | Disposition: A | Discharge status: Home / Self Care | Payer: BC Managed Care – PPO | Source: Ambulatory Visit | Attending: Urology | Admitting: Urology

## 2012-11-01 ENCOUNTER — Encounter (HOSPITAL_COMMUNITY)
Admission: RE | Disposition: A | Discharge status: Home / Self Care | Payer: Self-pay | Source: Ambulatory Visit | Attending: Urology

## 2012-11-01 ENCOUNTER — Ambulatory Visit (HOSPITAL_COMMUNITY): Payer: BC Managed Care – PPO

## 2012-11-01 DIAGNOSIS — R3129 Other microscopic hematuria: Secondary | ICD-10-CM | POA: Insufficient documentation

## 2012-11-01 DIAGNOSIS — Z7982 Long term (current) use of aspirin: Secondary | ICD-10-CM | POA: Insufficient documentation

## 2012-11-01 DIAGNOSIS — I10 Essential (primary) hypertension: Secondary | ICD-10-CM | POA: Insufficient documentation

## 2012-11-01 DIAGNOSIS — E7800 Pure hypercholesterolemia, unspecified: Secondary | ICD-10-CM | POA: Insufficient documentation

## 2012-11-01 DIAGNOSIS — N200 Calculus of kidney: Secondary | ICD-10-CM | POA: Insufficient documentation

## 2012-11-01 DIAGNOSIS — Z79899 Other long term (current) drug therapy: Secondary | ICD-10-CM | POA: Insufficient documentation

## 2012-11-01 SURGERY — LITHOTRIPSY, ESWL
Anesthesia: LOCAL | Laterality: Right

## 2012-11-01 MED ORDER — DIAZEPAM 5 MG PO TABS
10.0000 mg | ORAL_TABLET | ORAL | Status: AC
  Administered 2012-11-01: 10 mg via ORAL
  Filled 2012-11-01: qty 2

## 2012-11-01 MED ORDER — CIPROFLOXACIN HCL 500 MG PO TABS
500.0000 mg | ORAL_TABLET | ORAL | Status: AC
  Administered 2012-11-01: 500 mg via ORAL
  Filled 2012-11-01: qty 1

## 2012-11-01 MED ORDER — DEXTROSE-NACL 5-0.45 % IV SOLN
INTRAVENOUS | Status: DC
  Administered 2012-11-01: 11:00:00 via INTRAVENOUS

## 2012-11-01 MED ORDER — DIPHENHYDRAMINE HCL 25 MG PO CAPS
25.0000 mg | ORAL_CAPSULE | ORAL | Status: AC
  Administered 2012-11-01: 25 mg via ORAL
  Filled 2012-11-01: qty 1

## 2012-11-01 NOTE — Op Note (Signed)
Refer to [facility] Op Note scanned in the chart 

## 2012-11-01 NOTE — Progress Notes (Signed)
returned form [employer] truck and assisted from w/c to recliner. Right flank has pink area , softball sized abrasion

## 2012-11-01 NOTE — H&P (Signed)
History of Present Illness     Emily Chambers has a history of hematuria.  Work-up a year ago included a CT scan that showed a 6 mm non obstructing right renal calculus.  She has frequency, nocturia x 3-4.  She has mild right flank discomfort on and off.  She denies gross hematuria.  Urinalysis shows 30 mgm protein, 3-6 WBC's, TNTC RBC's, rare bacteria. KUB today shows increase in size of the stone to 10 mm.   Past Medical History Problems  1. History of  Hypercholesterolemia 272.0 2. History of  Hypertension 401.9  Surgical History Problems  1. History of  Biopsy Breast Open Left  Current Meds 1. AmLODIPine Besylate 10 MG Oral Tablet; Therapy: (Recorded:22Aug2013) to 2. Aspirin 81 MG Oral Tablet; Therapy: (Recorded:14Apr2010) to 3. Losartan Potassium 100 MG Oral Tablet; Therapy: (Recorded:22Aug2013) to 4. Metoprolol Tartrate 50 MG Oral Tablet; Therapy: (Recorded:22Aug2013) to 5. Pravastatin Sodium 20 MG Oral Tablet; Therapy: (Recorded:22Aug2013) to  Allergies Medication  1. No Known Drug Allergies  Family History Problems  1. Fraternal history of  Family Health Status - Father's Age 58 2. Fraternal history of  Family Health Status - Mother's Age 87 3. Maternal history of  Hypercholesterolemia 4. Family history of  Hypertension V17.49 5. Maternal history of  Renal Failure  Social History Problems  1. Alcohol Use occasionaly 2. Caffeine Use 2 to 3 per day 3. Marital History - Single 4. Never A Smoker Denied  5. History of  Tobacco Use  Review of Systems Genitourinary, constitutional, skin, eye, otolaryngeal, hematologic/lymphatic, cardiovascular, pulmonary, endocrine, musculoskeletal, gastrointestinal, neurological and psychiatric system(s) were reviewed and pertinent findings if present are noted.  Genitourinary: urinary frequency and nocturia.  Musculoskeletal: back pain.    Vitals Vital Signs [Data Includes: Last 1 Day]  21Aug2014 02:22PM  Blood Pressure: 113 /  68 Heart Rate: 75 Respiration: 18  Physical Exam Constitutional: Well nourished and well developed . No acute distress.  ENT:. The ears and nose are normal in appearance.  Neck: The appearance of the neck is normal and no neck mass is present.  Pulmonary: No respiratory distress and normal respiratory rhythm and effort.  Cardiovascular: Heart rate and rhythm are normal . No peripheral edema.  Abdomen: The abdomen is soft and nontender. No masses are palpated. mild right CVA tenderness no CVA tenderness. No hernias are palpable. No hepatosplenomegaly noted.  Genitourinary:  The bladder is non tender and not distended.  Lymphatics: The femoral and inguinal nodes are not enlarged or tender.  Skin: Normal skin turgor, no visible rash and no visible skin lesions.  Neuro/Psych:. Mood and affect are appropriate.    Results/Data Urine [Data Includes: Last 1 Day]   21Aug2014  COLOR AMBER   APPEARANCE CLOUDY   SPECIFIC GRAVITY 1.025   pH 8.0   GLUCOSE NEG mg/dL  BILIRUBIN NEG   KETONE NEG mg/dL  BLOOD LARGE   PROTEIN 30 mg/dL  UROBILINOGEN 0.2 mg/dL  NITRITE NEG   LEUKOCYTE ESTERASE SMALL   SQUAMOUS EPITHELIAL/HPF RARE   WBC 3-6 WBC/hpf  RBC TNTC RBC/hpf  BACTERIA RARE   CRYSTALS NONE SEEN   CASTS NONE SEEN   Other AMORPHOUS NOTED     KUB INDICATION: Right renal calculus KUB shows normal bowel gas pattern.  Psoas shadows are normal.  There is a calcification in the midpole of the right kidney that measures 10 mm.  There are no calcifications in the course of the ureters. IMPRESSION: Increase in size of the right renal calculus.  Assessment Assessed  1. Nephrolithiasis Of The Right Kidney 592.0 2. Microscopic Hematuria 599.72  Plan Health Maintenance (V70.0)  1. UA With REFLEX  Done: 21Aug2014 02:09PM Nephrolithiasis Of The Right Kidney (592.0)  2. KUB  Done: 21Aug2014 12:00AM Urinary Tract Infection (599.0)  3. URINE CULTURE 4. URINE CULTURE  Done: 21Aug2014   Since  the stone has increased in size I believe she needs definitive management of the stone.  ESL is the least invasive of the options.  I discussed the risks, benefits of ESL with the patient.  The risks include but are not limited to hemorrhage, infection, inability to fragment the stone, steinstrasse.  She understands and wishes to proceed.

## 2013-06-14 ENCOUNTER — Other Ambulatory Visit (HOSPITAL_COMMUNITY): Payer: Self-pay | Admitting: Family Medicine

## 2013-06-14 DIAGNOSIS — Z1231 Encounter for screening mammogram for malignant neoplasm of breast: Secondary | ICD-10-CM

## 2013-07-19 ENCOUNTER — Ambulatory Visit (HOSPITAL_COMMUNITY)
Admission: RE | Admit: 2013-07-19 | Discharge: 2013-07-19 | Disposition: A | Discharge status: Home / Self Care | Payer: BC Managed Care – PPO | Source: Ambulatory Visit | Attending: Family Medicine | Admitting: Family Medicine

## 2013-07-19 DIAGNOSIS — Z1231 Encounter for screening mammogram for malignant neoplasm of breast: Secondary | ICD-10-CM

## 2013-11-17 ENCOUNTER — Other Ambulatory Visit: Payer: Self-pay

## 2013-11-29 ENCOUNTER — Other Ambulatory Visit: Payer: Self-pay

## 2013-11-30 LAB — CYTOLOGY - PAP

## 2014-03-08 ENCOUNTER — Other Ambulatory Visit: Payer: Self-pay | Admitting: Nephrology

## 2014-03-08 DIAGNOSIS — R319 Hematuria, unspecified: Secondary | ICD-10-CM

## 2014-03-10 ENCOUNTER — Ambulatory Visit
Admission: RE | Admit: 2014-03-10 | Discharge: 2014-03-10 | Disposition: A | Discharge status: Home / Self Care | Payer: BLUE CROSS/BLUE SHIELD | Source: Ambulatory Visit | Attending: Nephrology | Admitting: Nephrology

## 2014-03-10 DIAGNOSIS — R319 Hematuria, unspecified: Secondary | ICD-10-CM

## 2014-09-06 ENCOUNTER — Other Ambulatory Visit (HOSPITAL_COMMUNITY): Payer: Self-pay | Admitting: Family Medicine

## 2014-09-06 DIAGNOSIS — Z1231 Encounter for screening mammogram for malignant neoplasm of breast: Secondary | ICD-10-CM

## 2014-10-06 ENCOUNTER — Ambulatory Visit (HOSPITAL_COMMUNITY): Payer: BLUE CROSS/BLUE SHIELD

## 2014-10-23 ENCOUNTER — Ambulatory Visit (INDEPENDENT_AMBULATORY_CARE_PROVIDER_SITE_OTHER): Payer: 59 | Admitting: Family

## 2014-10-23 ENCOUNTER — Encounter: Payer: Self-pay | Admitting: Family

## 2014-10-23 ENCOUNTER — Other Ambulatory Visit (INDEPENDENT_AMBULATORY_CARE_PROVIDER_SITE_OTHER): Payer: 59

## 2014-10-23 VITALS — BP 122/72 | HR 66 | Temp 98.5°F | Resp 18 | Ht 62.0 in | Wt 168.0 lb

## 2014-10-23 DIAGNOSIS — E785 Hyperlipidemia, unspecified: Secondary | ICD-10-CM

## 2014-10-23 DIAGNOSIS — I10 Essential (primary) hypertension: Secondary | ICD-10-CM

## 2014-10-23 LAB — BASIC METABOLIC PANEL
BUN: 14 mg/dL (ref 6–23)
CALCIUM: 10.6 mg/dL — AB (ref 8.4–10.5)
CHLORIDE: 106 meq/L (ref 96–112)
CO2: 31 mEq/L (ref 19–32)
CREATININE: 0.73 mg/dL (ref 0.40–1.20)
GFR: 103.24 mL/min (ref >60.00)
Glucose, Bld: 92 mg/dL (ref 70–99)
Potassium: 3.5 mEq/L (ref 3.5–5.1)
Sodium: 142 mEq/L (ref 135–145)

## 2014-10-23 NOTE — Progress Notes (Signed)
Subjective:    Patient ID: Emily Chambers, female    DOB: Mar 04, 1950, 64 y.o.   MRN: [id number]  Chief Complaint  Patient presents with  . Establish Care    HPI:  Emily Chambers is a 64 y.o. female with a PMH of hypertension, hyperlipidemia, and kidney stones who presents today for an office visit to establish care.   1.) Hypertension - Stable and currently maintained on amlodipine and losartan. Takes the medication as prescribed and denies adverse side effects. Due for an eye exam.  BP Readings from Last 3 Encounters:  10/23/14 122/72  11/01/12 141/91  07/07/12 158/93    2.) Hyperlipidemia - Currently maintained on pravastatin. Takes the medication as prescribed and denies myalgias or adverse side effects. Has not had a recent lipid profile check and is not currently fasting.   No Known Allergies   Outpatient Prescriptions Prior to Visit  Medication Sig Dispense Refill  . amLODipine (NORVASC) 10 MG tablet Take 10 mg by mouth every morning.     Marland Kitchen aspirin 81 MG chewable tablet Chew 81 mg by mouth daily.    Marland Kitchen losartan (COZAAR) 100 MG tablet Take 100 mg by mouth every morning.     . pravastatin (PRAVACHOL) 20 MG tablet Take 20 mg by mouth every morning.      No facility-administered medications prior to visit.     Past Medical History  Diagnosis Date  . Hypertension   . High cholesterol   . Kidney stone   . Kidney stones   . UTI (lower urinary tract infection)      Past Surgical History  Procedure Laterality Date  . Breast surgery Left     cyst and biopsy     Family History  Problem Relation Age of Onset  . Hypertension Mother   . Healthy Father   . Colon cancer Maternal Grandmother   . Colon cancer Paternal Grandmother      Social History   Social History  . Marital Status: Single    Spouse Name: N/A  . Number of Children: 0  . Years of Education: 14   Occupational History  . Laborer    Social History Main Topics  . Smoking status: Never  Smoker   . Smokeless tobacco: Never Used  . Alcohol Use: Yes     Comment: seldom  . Drug Use: No  . Sexual Activity: Not on file   Other Topics Concern  . Not on file   Social History Narrative   Fun: park, walking, music   Denies religious beliefs effecting health care.    Feels safe at home and denies abuse.     Review of Systems  Eyes:       Negative for changes in vision.   Respiratory: Negative for chest tightness and shortness of breath.   Cardiovascular: Negative for chest pain, palpitations and leg swelling.      Objective:    BP 122/72 mmHg  Pulse 66  Temp(Src) 98.5 F (36.9 C) (Oral)  Resp 18  Ht 5\' 2"  (1.575 m)  Wt 168 lb (76.204 kg)  BMI 30.72 kg/m2  SpO2 97% Nursing note and vital signs reviewed.  Physical Exam  Constitutional: She is oriented to person, place, and time. She appears well-developed and well-nourished. No distress.  Eyes: Conjunctivae and EOM are normal. Pupils are equal, round, and reactive to light.  Fundoscopic exam:      The right eye shows no hemorrhage and no papilledema. The right  eye shows red reflex.       The left eye shows no hemorrhage and no papilledema. The left eye shows red reflex.  Cardiovascular: Normal rate, regular rhythm, normal heart sounds and intact distal pulses.   Mild non-pitting lower extremity edema noted.   Pulmonary/Chest: Effort normal and breath sounds normal.  Neurological: She is alert and oriented to person, place, and time.  Skin: Skin is warm and dry.  Psychiatric: She has a normal mood and affect. Her behavior is normal. Judgment and thought content normal.       Assessment & Plan:   Problem List Items Addressed This Visit      Cardiovascular and Mediastinum   Essential hypertension - Primary    Hypertension is stable and below goal of 140/90 with current regimen of amlodipine and losartan. Obtain basic metabolic panel. Denies adverse side effects, although does have mild lower extremity edema.  Continue to monitor blood pressure at home. Continue current dosage of amlodipine and pravastatin.      Relevant Orders   Basic Metabolic Panel (BMET)     Other   Hyperlipidemia    Hyperlipidemia is stable with current regimen of pravastatin. Denies myalgias or adverse side effects. Due for an updated lipid profile. Continue current dosage of pravastatin.      Relevant Orders   Basic Metabolic Panel (BMET)

## 2014-10-23 NOTE — Assessment & Plan Note (Signed)
Hypertension is stable and below goal of 140/90 with current regimen of amlodipine and losartan. Obtain basic metabolic panel. Denies adverse side effects, although does have mild lower extremity edema. Continue to monitor blood pressure at home. Continue current dosage of amlodipine and pravastatin.

## 2014-10-23 NOTE — Patient Instructions (Signed)
Thank you for choosing [facility].  Summary/Instructions:  Please stop by the lab on the basement level of the building for your blood work. Your results will be released to [facility] (or called to you) after review, usually within 72 hours after test completion. If any changes need to be made, you will be notified at that same time.  If your symptoms worsen or fail to improve, please contact our office for further instruction, or in case of emergency go directly to the emergency room at the closest medical facility.   Fish Oil, Omega-3 Fatty Acids capsules (OTC) What is this medicine? FISH OIL, OMEGA-3 FATTY ACIDS (Fish Oil, oh MAY ga - 3 fatty AS ids) are essential fats. It is promoted to help support a healthy heart. This dietary supplement is used to add to a healthy diet. The FDA has not approved this supplement for any medical use. This supplement may be used for other purposes; ask your health care provider or pharmacist if you have questions. This medicine may be used for other purposes; ask your health care provider or pharmacist if you have questions. COMMON BRAND NAME(S): [employer], Ocean Blue Nutritionals Omega-3 1450, Ocean Blue Omega, [place] Professional Omega-3 2100, Omega-3, OMEGA-3 IQ DHA, Omega-3 [place], [place]-3, [place], [place], [place] SPORT What should I tell my health care provider before I take this medicine? They need to know if you have any of these conditions -bleeding problems -lung or breathing disease, like asthma -an unusual or allergic reaction to fish oil, omega-3 fatty acids, fish, other medicines, foods, dyes, or preservatives -pregnant or trying to get pregnant -breast-feeding How should I use this medicine? Take this medicine by mouth with a glass of water. Follow the directions on the package or prescription label. Take with food. Take your medicine at regular intervals. Do not take your medicine more often than directed. Talk to  your pediatrician regarding the use of this medicine in children. Special care may be needed. This medicine should not be used in children without a doctor's advice. Overdosage: If you think you have taken too much of this medicine contact a poison control center or emergency room at once. NOTE: This medicine is only for you. Do not share this medicine with others. What if I miss a dose? If you miss a dose, take it as soon as you can. If it is almost time for your next dose, take only that dose. Do not take double or extra doses. What may interact with this medicine? -aspirin and aspirin-like medicines -herbal products like danshen, dong quai, garlic pills, ginger, ginkgo biloba, horse chestnut, willow bark, and others -medicines that treat or prevent blood clots like enoxaparin, heparin, warfarin This list may not describe all possible interactions. Give your health care provider a list of all the medicines, herbs, non-prescription drugs, or dietary supplements you use. Also tell them if you smoke, drink alcohol, or use illegal drugs. Some items may interact with your medicine. What should I watch for while using this medicine? Follow a good diet and exercise plan. Taking a dietary supplement does not replace a healthy lifestyle. Some foods that have omega-3 fatty acids naturally are fatty fish like albacore tuna, halibut, herring, mackerel, lake trout, salmon, and sardines. Too much of this supplement can be unsafe. Talk to your doctor or health care provider about how much of this supplement is right for you. If you are scheduled for any medical or dental procedure, tell your healthcare provider that you  are taking this medicine. You may need to stop taking this medicine before the procedure. Herbal or dietary supplements are not regulated like medicines. Rigid quality control standards are not required for dietary supplements. The purity and strength of these products can vary. The safety and  effect of this dietary supplement for a certain disease or illness is not well known. This product is not intended to diagnose, treat, cure or prevent any disease. The Food and Drug Administration suggests the following to help consumers protect themselves: -Always read product labels and follow directions. -Natural does not mean a product is safe for humans to take. -Look for products that include USP after the ingredient name. This means that the manufacturer followed the standards of the [place] Pharmacopoeia. -Supplements made or sold by a nationally known food or drug company are more likely to be made under tight controls. You can write to the company for more information about how the product was made. What side effects may I notice from receiving this medicine? Side effects that you should report to your doctor or health care professional as soon as possible: -allergic reactions like skin rash, itching or hives, swelling of the face, lips, or tongue -breathing problems -changes in your moods or emotions -unusual bleeding or bruising Side effects that usually do not require medical attention (report to your doctor or health care professional if they continue or are bothersome): -bad or fishy breath -belching -diarrhea -nausea -stomach gas, upset -weight gain This list may not describe all possible side effects. Call your doctor for medical advice about side effects. You may report side effects to FDA at 1-800-FDA-1088. Where should I keep my medicine? Keep out of the reach of children. Store at room temperature or as directed on the package label. Protect from moisture. Do not freeze. Throw away any unused medicine after the expiration date. NOTE: This sheet is a summary. It may not cover all possible information. If you have questions about this medicine, talk to your doctor, pharmacist, or health care provider.  2015, Elsevier/Gold Standard. (2007-04-29 13:05:24)

## 2014-10-23 NOTE — Progress Notes (Signed)
Pre visit review using our clinic review tool, if applicable. No additional management support is needed unless otherwise documented below in the visit note. 

## 2014-10-23 NOTE — Assessment & Plan Note (Signed)
Hyperlipidemia is stable with current regimen of pravastatin. Denies myalgias or adverse side effects. Due for an updated lipid profile. Continue current dosage of pravastatin.

## 2014-10-24 ENCOUNTER — Telehealth: Payer: Self-pay | Admitting: Family

## 2014-10-24 NOTE — Telephone Encounter (Signed)
LVM for pt to call back.

## 2014-10-24 NOTE — Telephone Encounter (Signed)
Please inform patient that her blood work shows that her kidney function and electrolytes are within the normal limits. Therefore no further changes are needed at this time.

## 2014-10-31 NOTE — Telephone Encounter (Signed)
LVM for pt to call back. Sending results in the mail.

## 2014-11-13 ENCOUNTER — Other Ambulatory Visit (HOSPITAL_COMMUNITY): Payer: Self-pay | Admitting: Family Medicine

## 2014-11-13 ENCOUNTER — Ambulatory Visit (HOSPITAL_COMMUNITY)
Admission: RE | Admit: 2014-11-13 | Discharge: 2014-11-13 | Disposition: A | Discharge status: Home / Self Care | Payer: 59 | Source: Ambulatory Visit | Attending: Family Medicine | Admitting: Family Medicine

## 2014-11-13 DIAGNOSIS — Z1231 Encounter for screening mammogram for malignant neoplasm of breast: Secondary | ICD-10-CM | POA: Insufficient documentation

## 2015-05-15 ENCOUNTER — Ambulatory Visit (INDEPENDENT_AMBULATORY_CARE_PROVIDER_SITE_OTHER): Payer: BLUE CROSS/BLUE SHIELD

## 2015-05-15 DIAGNOSIS — Z23 Encounter for immunization: Secondary | ICD-10-CM | POA: Diagnosis not present

## 2016-01-25 ENCOUNTER — Other Ambulatory Visit: Payer: Self-pay | Admitting: Obstetrics & Gynecology

## 2016-01-25 DIAGNOSIS — E2839 Other primary ovarian failure: Secondary | ICD-10-CM

## 2016-03-25 ENCOUNTER — Ambulatory Visit
Admission: RE | Admit: 2016-03-25 | Discharge: 2016-03-25 | Disposition: A | Discharge status: Home / Self Care | Payer: BLUE CROSS/BLUE SHIELD | Source: Ambulatory Visit | Attending: Obstetrics & Gynecology | Admitting: Obstetrics & Gynecology

## 2016-03-25 DIAGNOSIS — E2839 Other primary ovarian failure: Secondary | ICD-10-CM

## 2016-09-12 ENCOUNTER — Telehealth: Payer: Self-pay | Admitting: Family

## 2016-09-12 NOTE — Telephone Encounter (Signed)
LM for pt to schedule a Welcome to Medicare visit before 10/25/2016.

## 2017-05-14 ENCOUNTER — Ambulatory Visit (HOSPITAL_COMMUNITY)
Admission: EM | Admit: 2017-05-14 | Discharge: 2017-05-14 | Disposition: A | Discharge status: Home / Self Care | Payer: Medicare Other | Attending: Family Medicine | Admitting: Family Medicine

## 2017-05-14 ENCOUNTER — Encounter (HOSPITAL_COMMUNITY): Payer: Self-pay | Admitting: Emergency Medicine

## 2017-05-14 ENCOUNTER — Other Ambulatory Visit: Payer: Self-pay

## 2017-05-14 DIAGNOSIS — R05 Cough: Secondary | ICD-10-CM

## 2017-05-14 DIAGNOSIS — R059 Cough, unspecified: Secondary | ICD-10-CM

## 2017-05-14 MED ORDER — HYDROCODONE-HOMATROPINE 5-1.5 MG/5ML PO SYRP: 5.0000 mL | ORAL_SOLUTION | Freq: Every evening | ORAL | 0 refills | Status: DC | PRN

## 2017-05-14 MED ORDER — AZITHROMYCIN 250 MG PO TABS: 250.0000 mg | ORAL_TABLET | Freq: Every day | ORAL | 0 refills | Status: DC

## 2017-05-14 NOTE — ED Triage Notes (Signed)
C/o cough x 2 weeks with rhinitis. States did see PCP for problem but "not better"

## 2017-05-14 NOTE — ED Provider Notes (Signed)
[facility]   [id number] 05/14/17 Arrival Time: 7209  ASSESSMENT & PLAN:  1. Cough    Meds ordered this encounter  Medications  . azithromycin (ZITHROMAX) 250 MG tablet    Sig: Take 1 tablet (250 mg total) by mouth daily. Take first 2 tablets together, then 1 every day until finished.    Dispense:  6 tablet    Refill:  0  . HYDROcodone-homatropine (HYCODAN) 5-1.5 MG/5ML syrup    Sig: Take 5 mLs by mouth at bedtime as needed for cough.    Dispense:  60 mL    Refill:  0   Given duration of symptoms will treat. Cough medication sedation precautions. OTC symptom care as needed. Ensure adequate fluid intake and rest. May f/u with PCP or here as needed.  Reviewed expectations re: course of current medical issues. Questions answered. Outlined signs and symptoms indicating need for more acute intervention. Patient verbalized understanding. After Visit Summary given.   SUBJECTIVE: History from: patient.  ELVENA OYER is a 67 y.o. female who presents with complaint of nasal congestion, post-nasal drainage, and a persistent dry cough. Onset abrupt, approximately 2 weeks ago. Overall fatigued. SOB: none. Wheezing: none. Fever: unsure; occasional chills. Overall normal PO intake without n/v. Sick contacts: yes. OTC treatment: cough medication without relief. Saw PCP "but I'm not getting any better."  Social History   Tobacco Use  Smoking Status Never Smoker  Smokeless Tobacco Never Used    ROS: As per HPI.   OBJECTIVE:  Vitals:   05/14/17 1457  BP: 115/65  Pulse: 79  Temp: (!) 97.5 F (36.4 C)  TempSrc: Oral  SpO2: 98%     General appearance: alert; appears fatigued HEENT: nasal congestion; clear runny nose; throat irritation secondary to post-nasal drainage Neck: supple without LAD Lungs: unlabored respirations, symmetrical air entry; cough: moderate; no respiratory distress Skin: warm and dry Psychological: alert and cooperative; normal mood and  affect  No Known Allergies  Past Medical History:  Diagnosis Date  . High cholesterol   . Hypertension   . Kidney stone   . Kidney stones   . UTI (lower urinary tract infection)    Family History  Problem Relation Age of Onset  . Hypertension Mother   . Healthy Father   . Colon cancer Maternal Grandmother   . Colon cancer Paternal Grandmother    Social History   Socioeconomic History  . Marital status: Single    Spouse name: Not on file  . Number of children: 0  . Years of education: 26  . Highest education level: Not on file  Occupational History  . Occupation: Laborer  Scientific laboratory technician  . Financial resource strain: Not on file  . Food insecurity:    Worry: Not on file    Inability: Not on file  . Transportation needs:    Medical: Not on file    Non-medical: Not on file  Tobacco Use  . Smoking status: Never Smoker  . Smokeless tobacco: Never Used  Substance and Sexual Activity  . Alcohol use: Yes    Comment: seldom  . Drug use: No  . Sexual activity: Not on file  Lifestyle  . Physical activity:    Days per week: Not on file    Minutes per session: Not on file  . Stress: Not on file  Relationships  . Social connections:    Talks on phone: Not on file    Gets together: Not on file    Attends religious  service: Not on file    Active member of club or organization: Not on file    Attends meetings of clubs or organizations: Not on file    Relationship status: Not on file  . Intimate partner violence:    Fear of current or ex partner: Not on file    Emotionally abused: Not on file    Physically abused: Not on file    Forced sexual activity: Not on file  Other Topics Concern  . Not on file  Social History Narrative   Fun: park, walking, music   Denies religious beliefs effecting health care.    Feels safe at home and denies abuse.            Vanessa Kick, MD 05/18/17 1300

## 2017-05-14 NOTE — Discharge Instructions (Addendum)
Be aware, your cough medication may cause drowsiness. Please do not drive, operate heavy machinery or make important decisions while on this medication, it can cloud your judgement.  

## 2017-05-28 ENCOUNTER — Encounter (HOSPITAL_COMMUNITY): Payer: Self-pay | Admitting: Emergency Medicine

## 2017-05-28 ENCOUNTER — Emergency Department (HOSPITAL_COMMUNITY)
Admission: EM | Admit: 2017-05-28 | Discharge: 2017-05-28 | Disposition: A | Discharge status: Home / Self Care | Payer: Medicare Other | Attending: Emergency Medicine | Admitting: Emergency Medicine

## 2017-05-28 DIAGNOSIS — E7800 Pure hypercholesterolemia, unspecified: Secondary | ICD-10-CM | POA: Insufficient documentation

## 2017-05-28 DIAGNOSIS — Z79899 Other long term (current) drug therapy: Secondary | ICD-10-CM | POA: Diagnosis not present

## 2017-05-28 DIAGNOSIS — I10 Essential (primary) hypertension: Secondary | ICD-10-CM | POA: Diagnosis not present

## 2017-05-28 DIAGNOSIS — R55 Syncope and collapse: Secondary | ICD-10-CM | POA: Diagnosis present

## 2017-05-28 DIAGNOSIS — Z7982 Long term (current) use of aspirin: Secondary | ICD-10-CM | POA: Diagnosis not present

## 2017-05-28 DIAGNOSIS — E860 Dehydration: Secondary | ICD-10-CM

## 2017-05-28 LAB — CBC
HCT: 33.8 % — ABNORMAL LOW (ref 36.0–46.0)
Hemoglobin: 10.3 g/dL — ABNORMAL LOW (ref 12.0–15.0)
MCH: 27 pg (ref 26.0–34.0)
MCHC: 30.5 g/dL (ref 30.0–36.0)
MCV: 88.7 fL (ref 78.0–100.0)
Platelets: 159 10*3/uL (ref 150–400)
RBC: 3.81 MIL/uL — ABNORMAL LOW (ref 3.87–5.11)
RDW: 14.2 % (ref 11.5–15.5)
WBC: 4.3 10*3/uL (ref 4.0–10.5)

## 2017-05-28 LAB — BASIC METABOLIC PANEL
Anion gap: 10 (ref 5–15)
BUN: 19 mg/dL (ref 6–20)
CO2: 21 mmol/L — ABNORMAL LOW (ref 22–32)
Calcium: 8.8 mg/dL — ABNORMAL LOW (ref 8.9–10.3)
Chloride: 109 mmol/L (ref 101–111)
Creatinine, Ser: 1.1 mg/dL — ABNORMAL HIGH (ref 0.44–1.00)
GFR calc Af Amer: 59 mL/min — ABNORMAL LOW (ref >60)
GFR calc non Af Amer: 51 mL/min — ABNORMAL LOW (ref >60)
Glucose, Bld: 129 mg/dL — ABNORMAL HIGH (ref 65–99)
Potassium: 3.7 mmol/L (ref 3.5–5.1)
Sodium: 140 mmol/L (ref 135–145)

## 2017-05-28 MED ORDER — SODIUM CHLORIDE 0.9 % IV BOLUS
1000.0000 mL | Freq: Once | INTRAVENOUS | Status: AC
  Administered 2017-05-28: 1000 mL via INTRAVENOUS

## 2017-05-28 NOTE — ED Triage Notes (Signed)
Pt to ER from work where patient had witnessed syncopal episode. States felt hot, dizzy and "fell out." reportedly hit her head, denies head pain or neck pain, no midline tenderness. No trauma noted. Pt is a/o x4 on arrival. Reports 2 days of diarrhea and poor appetite/intake.

## 2017-05-28 NOTE — ED Notes (Signed)
Pt stable, ambulatory, states understanding of discharge instructions 

## 2017-05-28 NOTE — Discharge Instructions (Addendum)
Please do not take your blood pressure medicines tomorrow Restart them on Saturday Drink plenty of fluids Follow up with your doctor Return if worsening

## 2017-05-28 NOTE — ED Provider Notes (Signed)
[facility] Provider Note   CSN: [id number] Arrival date & time: 05/28/17  5188     History   Chief Complaint Chief Complaint  Patient presents with  . Loss of Consciousness    HPI Emily Chambers is a 67 y.o. female who presents with syncope.  Past medical history significant for hypertension, hyperlipidemia.  Patient states that she was at work today when she felt very hot. She works in a [employer]. She took her coat off and then had a syncopal episode and was on the floor.  EMS was called.  EKG obtained on scene was unremarkable.  Currently she feels back to baseline other than mild lightheadedness.  She states that she was seen at urgent care last week for a upper respiratory infection.  She was given an Z-Pak and cough medicine.  She states these symptoms have been improving however 2 days ago she started having watery diarrhea and a right-sided headache.  She denies fever, chills, chest pain, shortness of breath, abdominal pain.  She has had nausea without vomiting.  Diarrhea is nonbloody.  She also reports decreased p.o. intake.  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   HPI  Past Medical History:  Diagnosis Date  . High cholesterol   . Hypertension   . Kidney stone   . Kidney stones   . UTI (lower urinary tract infection)     Patient Active Problem List   Diagnosis Date Noted  . Essential hypertension 10/23/2014  . Hyperlipidemia 10/23/2014    Past Surgical History:  Procedure Laterality Date  . BREAST SURGERY Left    cyst and biopsy     OB History   None      Home Medications    Prior to Admission medications   Medication Sig Start Date End Date Taking? Authorizing Provider  amLODipine (NORVASC) 10 MG tablet Take 10 mg by mouth every morning.     [provider]  aspirin 81 MG chewable tablet Chew 81 mg by mouth daily.    [provider]  azithromycin (ZITHROMAX) 250 MG tablet Take 1  tablet (250 mg total) by mouth daily. Take first 2 tablets together, then 1 every day until finished. 05/14/17   Vanessa Kick, MD  benzonatate (TESSALON) 100 MG capsule Take by mouth 3 (three) times daily as needed for cough.    [provider]  HYDROcodone-homatropine (HYCODAN) 5-1.5 MG/5ML syrup Take 5 mLs by mouth at bedtime as needed for cough. 05/14/17   Vanessa Kick, MD  losartan (COZAAR) 100 MG tablet Take 100 mg by mouth every morning.     [provider]  pravastatin (PRAVACHOL) 20 MG tablet Take 20 mg by mouth every morning.     [provider]    Family History Family History  Problem Relation Age of Onset  . Hypertension Mother   . Healthy Father   . Colon cancer Maternal Grandmother   . Colon cancer Paternal Grandmother     Social History Social History   Tobacco Use  . Smoking status: Never Smoker  . Smokeless tobacco: Never Used  Substance Use Topics  . Alcohol use: Yes    Comment: seldom  . Drug use: No     Allergies   Patient has no known allergies.   Review of Systems Review of Systems  Constitutional: Negative for chills and fever.  Respiratory: Negative for shortness of breath.   Cardiovascular: Negative for chest pain.  Gastrointestinal:  Positive for diarrhea and nausea. Negative for abdominal pain and vomiting.  Genitourinary: Negative for dysuria.  Neurological: Positive for syncope, light-headedness and headaches. Negative for weakness.  All other systems reviewed and are negative.    Physical Exam Updated Vital Signs BP 115/67 (BP Location: Right Arm)   Pulse 71   Temp 98.1 F (36.7 C) (Oral)   Resp 18   SpO2 97%   Physical Exam  Constitutional: She is oriented to person, place, and time. She appears well-developed and well-nourished. No distress.  HENT:  Head: Normocephalic and atraumatic.  Eyes: Pupils are equal, round, and reactive to light. Conjunctivae are normal. Right eye exhibits no discharge. Left  eye exhibits no discharge. No scleral icterus.  Neck: Normal range of motion.  Cardiovascular: Normal rate and regular rhythm.  Pulmonary/Chest: Effort normal and breath sounds normal. No respiratory distress.  Abdominal: Soft. Bowel sounds are normal. She exhibits no distension. There is no tenderness.  Neurological: She is alert and oriented to person, place, and time.  Skin: Skin is warm and dry.  Psychiatric: She has a normal mood and affect. Her behavior is normal.  Nursing note and vitals reviewed.    ED Treatments / Results  Labs (all labs ordered are listed, but only abnormal results are displayed) Labs Reviewed  BASIC METABOLIC PANEL - Abnormal; Notable for the following components:      Result Value   CO2 21 (*)    Glucose, Bld 129 (*)    Creatinine, Ser 1.10 (*)    Calcium 8.8 (*)    GFR calc non Af Amer 51 (*)    GFR calc Af Amer 59 (*)    All other components within normal limits  CBC - Abnormal; Notable for the following components:   RBC 3.81 (*)    Hemoglobin 10.3 (*)    HCT 33.8 (*)    All other components within normal limits    EKG EKG Interpretation  Date/Time:  Thursday May 28 2017 10:01:18 EDT Ventricular Rate:  67 PR Interval:    QRS Duration: 81 QT Interval:  379 QTC Calculation: 400 R Axis:   32 Text Interpretation:  Sinus rhythm Non-specific ST-t changes Confirmed by Virgel Manifold [phone]) on 05/28/2017 10:18:09 AM   Radiology No results found.  Procedures Procedures (including critical care time)  Medications Ordered in ED Medications  sodium chloride 0.9 % bolus 1,000 mL (0 mLs Intravenous Stopped 05/28/17 1153)  sodium chloride 0.9 % bolus 1,000 mL (0 mLs Intravenous Stopped 05/28/17 1256)     Initial Impression / Assessment and Plan / ED Course  I have reviewed the triage vital signs and the nursing notes.  Pertinent labs & imaging results that were available during my care of the patient were reviewed by me and considered in my  medical decision making (see chart for details).  67 year old female presents with syncopal episode while at work today.  Blood pressures are soft otherwise vital signs are normal.  Exam is unremarkable.  She had prodromal symptoms prior to syncopal event likely due to volume depletion from diarrhea and decreased p.o. intake.  Her CBC shows mild anemia at 10.3.  Her BMP is remarkable for mild elevation of her creatinine is 1.1 today.  She was given 2 L of fluid and afterwards felt better.  Orthostatics were checked which were negative.    Orthostatic VS for the past 24 hrs:  BP- Lying Pulse- Lying BP- Sitting Pulse- Sitting BP- Standing at 0 minutes Pulse- Standing  at 0 minutes  05/28/17 1231 102/66 66 122/75 70 124/72 73    She was advised to follow-up with her primary doctor and return if worsening.  Final Clinical Impressions(s) / ED Diagnoses   Final diagnoses:  Syncope and collapse  Dehydration    ED Discharge Orders    None       Recardo Evangelist, PA-C 05/28/17 1309    Virgel Manifold, MD 05/29/17 [phone]

## 2018-02-12 ENCOUNTER — Other Ambulatory Visit: Payer: Self-pay | Admitting: Obstetrics & Gynecology

## 2018-02-12 DIAGNOSIS — R928 Other abnormal and inconclusive findings on diagnostic imaging of breast: Secondary | ICD-10-CM

## 2018-02-16 ENCOUNTER — Ambulatory Visit
Admission: RE | Admit: 2018-02-16 | Discharge: 2018-02-16 | Disposition: A | Discharge status: Home / Self Care | Payer: Medicare Other | Source: Ambulatory Visit | Attending: Obstetrics & Gynecology | Admitting: Obstetrics & Gynecology

## 2018-02-16 ENCOUNTER — Other Ambulatory Visit: Payer: Self-pay | Admitting: Obstetrics & Gynecology

## 2018-02-16 DIAGNOSIS — R928 Other abnormal and inconclusive findings on diagnostic imaging of breast: Secondary | ICD-10-CM

## 2018-02-16 DIAGNOSIS — N6320 Unspecified lump in the left breast, unspecified quadrant: Secondary | ICD-10-CM

## 2018-02-19 ENCOUNTER — Ambulatory Visit
Admission: RE | Admit: 2018-02-19 | Discharge: 2018-02-19 | Disposition: A | Discharge status: Home / Self Care | Payer: Medicare Other | Source: Ambulatory Visit | Attending: Obstetrics & Gynecology | Admitting: Obstetrics & Gynecology

## 2018-02-19 ENCOUNTER — Other Ambulatory Visit: Payer: Self-pay | Admitting: Obstetrics & Gynecology

## 2018-02-19 DIAGNOSIS — N6320 Unspecified lump in the left breast, unspecified quadrant: Secondary | ICD-10-CM

## 2018-02-24 HISTORY — PX: BREAST LUMPECTOMY: SHX2

## 2018-02-26 ENCOUNTER — Telehealth: Payer: Self-pay | Admitting: Oncology

## 2018-02-26 ENCOUNTER — Encounter: Payer: Self-pay | Admitting: *Deleted

## 2018-02-26 DIAGNOSIS — Z171 Estrogen receptor negative status [ER-]: Secondary | ICD-10-CM

## 2018-02-26 DIAGNOSIS — Z170 Estrogen receptor positive status [ER+]: Principal | ICD-10-CM

## 2018-02-26 DIAGNOSIS — C50412 Malignant neoplasm of upper-outer quadrant of left female breast: Secondary | ICD-10-CM | POA: Insufficient documentation

## 2018-02-26 DIAGNOSIS — C50512 Malignant neoplasm of lower-outer quadrant of left female breast: Secondary | ICD-10-CM

## 2018-02-26 NOTE — Telephone Encounter (Signed)
Called patient on 12/31 and 1/3 LVM both times, advised patient of upcoming afternoon [facility] appointment on 1/8, letter sent as well as packet through mail.

## 2018-03-02 NOTE — Progress Notes (Signed)
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 She underwent bilateral diagnostic mammography with tomography and left breast ultrasonography at [facility] on 02/16/2018 showing: breast density category C; two left breast masses, one at 2 o'clock and the other at 3:30 o'clock. The 2 o'clock mass (1.3 x 1 x 1 cm) corresponds to the mammographic abnormality and is highly suspicious for breast carcinoma. The other mass (1.2 x 0.7 x 1.2 cm)  is suspicious for breast carcinoma. No left axillary adenopathy.   Accordingly on 02/19/2018 she proceeded to biopsy of the left breast area in question. 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 Prognostic indicators significant for: estrogen receptor, 90% positive and progesterone receptor, 95% positive, both with strong staining intensity. Proliferation marker Ki67 at 5%. HER2 negative (1+).   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 Prognostic indicators significant for: estrogen receptor, 10% positive with moderate staining intensity and progesterone receptor, 0% negative. Proliferation marker Ki67  at 40%. HER2 negative (1+).  The patient's subsequent history is as detailed below.   INTERVAL HISTORY: Cordell was evaluated in the multidisciplinary breast cancer clinic on 03/02/2018 unaccompanied. At that time a preliminary plan was proposed: bilateral breast MRI, port placement, neoadjuvant chemotherapy, radiation therapy, and aromatase inhibitor. She will also receive genetics testing, echocardiogram, and chemotherapy class.   REVIEW OF SYSTEMS: [name] reports feeling well overall. There were no specific symptoms leading to the original mammogram, which was routinely scheduled. The patient denies unusual headaches, visual changes, nausea, vomiting, stiff neck, dizziness, or gait imbalance. There has been no cough, phlegm production, or pleurisy, no chest pain or pressure, and no change in bowel or bladder habits. The patient denies fever, rash, bleeding, unexplained fatigue or unexplained weight loss. A detailed review of systems was otherwise entirely negative.   PAST MEDICAL HISTORY: Past Medical History:  Diagnosis Date  . High cholesterol   . Hypertension   . Kidney stone   . Kidney stones   . UTI (lower urinary tract infection)     PAST SURGICAL HISTORY: Past Surgical History:  Procedure Laterality Date  . BREAST EXCISIONAL BIOPSY Left 1999,2005   cysts removed  . BREAST SURGERY Left    cyst and biopsy    FAMILY HISTORY Family History  Problem Relation Age of Onset  . Hypertension Mother   . Heart disease Mother   . Dementia Mother   . Healthy Father   . Colon cancer Maternal Grandmother   . Colon cancer Paternal Grandmother   . Lung cancer Cousin        11s  . Breast cancer Cousin        60s  . Breast cancer Cousin        40s  . Breast cancer Cousin   . Ovarian  cancer Neg Hx    Patient father is alive at 28 years old. Patient mother died from heart disease and dementia at age 3.  The patient denies a family hx of ovarian cancer. She has 3 siblings,  1 brother and 2 sisters. She has a maternal cousin diagnosed with lung cancer in her 9s. She has 3 paternal cousins with breast cancer, one was diagnosed in her 29s and has passed away.  GYNECOLOGIC HISTORY:  No LMP recorded. Patient is postmenopausal. Menarche: 68 years old Age at first live birth: n/a GX P 0 LMP 2010 Contraceptive n/a HRT no  Hysterectomy? no BSo? no   SOCIAL HISTORY: She is single and works as a Scientist, water quality at [employer]). She lives alone, with no pets.      ADVANCED DIRECTIVES: Not in place.  At the 03/03/2018 visit she was given the appropriate documents to complete and notarize at her discretion.  She is planning to name her niece, [name], as her HCPOA.   HEALTH MAINTENANCE: Social History   Tobacco Use  . Smoking status: Never Smoker  . Smokeless tobacco: Never Used  Substance Use Topics  . Alcohol use: Yes    Comment: seldom  . Drug use: No     Colonoscopy: 2014? [facility]  PAP: 02/02/2018  Bone density: 03/15/2016, T-score 0.4, Dr. Alden Hipp   No Known Allergies  Current Outpatient Medications  Medication Sig Dispense Refill  . amLODipine (NORVASC) 10 MG tablet Take 10 mg by mouth every morning.     Marland Kitchen aspirin 81 MG chewable tablet Chew 81 mg by mouth daily.    Marland Kitchen losartan (COZAAR) 100 MG tablet Take 100 mg by mouth every morning.     . pravastatin (PRAVACHOL) 20 MG tablet Take 20 mg by mouth every morning.      No current facility-administered medications for this visit.     OBJECTIVE: Middle-aged African-American woman who appears well  Vitals:   03/03/18 1308  BP: 139/74  Pulse: (!) 19  Resp: 18  Temp: 98.3 F (36.8 C)  SpO2: 98%     Body mass index is 32.1 kg/m.   Wt Readings from Last 3 Encounters:  03/03/18 175 lb 8 oz (79.6 kg)  10/23/14 168 lb (76.2 kg)  11/01/12 159 lb 8 oz (72.3 kg)      ECOG FS:0 - Asymptomatic  Ocular: Sclerae unicteric, pupils round and  equal Ear-nose-throat: Oropharynx clear and moist Lymphatic: No cervical or supraclavicular adenopathy Lungs no rales or rhonchi Heart regular rate and rhythm Abd soft, nontender, positive bowel sounds MSK no focal spinal tenderness, no joint edema Neuro: non-focal, well-oriented, appropriate affect Breasts: I do not palpate a mass in either breast.  Both axillae are benign.   LAB RESULTS:  CMP     Component Value Date/Time   NA 145 03/03/2018 1214   K 3.6 03/03/2018 1214   CL 108 03/03/2018 1214   CO2 28 03/03/2018 1214   GLUCOSE 157 (H) 03/03/2018 1214   BUN 15 03/03/2018 1214   CREATININE 0.80 03/03/2018 1214   CALCIUM 10.3 03/03/2018 1214   PROT 7.7 03/03/2018 1214   ALBUMIN 4.0 03/03/2018 1214   AST 14 (L) 03/03/2018 1214   ALT 19 03/03/2018 1214   ALKPHOS 105 03/03/2018 1214   BILITOT 0.4 03/03/2018 1214   GFRNONAA >60 03/03/2018 1214   GFRAA >60 03/03/2018 1214    No results found for: TOTALPROTELP, ALBUMINELP, A1GS, A2GS, BETS, BETA2SER, GAMS, MSPIKE, SPEI  No results found for: Nils Pyle, [facility]  Lab Results  Component Value Date   WBC 8.0 03/03/2018   NEUTROABS 4.5 03/03/2018   HGB 12.0 03/03/2018   HCT 39.1 03/03/2018   MCV 89.1 03/03/2018   PLT 212 03/03/2018    @LASTCHEMISTRY @  No results found for: LABCA2  No components found for: EHUDJS970  No results for input(s): INR in the last 168 hours.  No results found for: LABCA2  No results found for: YOV785  No results found for: YIF027  No results found for: XAJ287  No results found for: CA2729  No components found for: HGQUANT  No results found for: CEA1 / No results found for: CEA1   No results found for: AFPTUMOR  No results found for: CHROMOGRNA  No results found for: PSA1  Appointment on 03/03/2018  Component Date Value Ref Range Status  . Sodium 03/03/2018 145  135 - 145 mmol/L Final  . Potassium 03/03/2018 3.6  3.5 - 5.1 mmol/L Final  . Chloride  03/03/2018 108  98 - 111 mmol/L Final  . CO2 03/03/2018 28  22 - 32 mmol/L Final  . Glucose, Bld 03/03/2018 157* 70 - 99 mg/dL Final  . BUN 03/03/2018 15  8 - 23 mg/dL Final  . Creatinine 03/03/2018 0.80  0.44 - 1.00 mg/dL Final  . Calcium 03/03/2018 10.3  8.9 - 10.3 mg/dL Final  . Total Protein 03/03/2018 7.7  6.5 - 8.1 g/dL Final  . Albumin 03/03/2018 4.0  3.5 - 5.0 g/dL Final  . AST 03/03/2018 14* 15 - 41 U/L Final  . ALT 03/03/2018 19  0 - 44 U/L Final  . Alkaline Phosphatase 03/03/2018 105  38 - 126 U/L Final  . Total Bilirubin 03/03/2018 0.4  0.3 - 1.2 mg/dL Final  . GFR, Est Non Af Am 03/03/2018 >60  >60 mL/min Final  . GFR, Est AFR Am 03/03/2018 >60  >60 mL/min Final  . Anion gap 03/03/2018 9  5 - 15 Final   Performed at [facility], [place] [address] [place], [place] [address]  . WBC Count 03/03/2018 8.0  4.0 - 10.5 K/uL Final  . RBC 03/03/2018 4.39  3.87 - 5.11 MIL/uL Final  . Hemoglobin 03/03/2018 12.0  12.0 - 15.0 g/dL Final  . HCT 03/03/2018 39.1  36.0 - 46.0 % Final  . MCV 03/03/2018 89.1  80.0 - 100.0 fL Final  . MCH 03/03/2018 27.3  26.0 - 34.0 pg Final  . MCHC 03/03/2018 30.7  30.0 - 36.0 g/dL Final  . RDW 03/03/2018 13.0  11.5 - 15.5 % Final  . Platelet Count 03/03/2018 212  150 - 400 K/uL Final  . nRBC 03/03/2018 0.0  0.0 - 0.2 % Final  . Neutrophils Relative % 03/03/2018 57  % Final  . Neutro Abs 03/03/2018 4.5  1.7 - 7.7 K/uL Final  . Lymphocytes Relative 03/03/2018 35  % Final  . Lymphs Abs 03/03/2018 2.8  0.7 - 4.0 K/uL Final  . Monocytes Relative 03/03/2018 5  % Final  . Monocytes Absolute 03/03/2018 0.4  0.1 - 1.0 K/uL Final  . Eosinophils Relative 03/03/2018 2  % Final  . Eosinophils Absolute 03/03/2018 0.1  0.0 - 0.5 K/uL Final  . Basophils Relative 03/03/2018 1  % Final  . Basophils Absolute 03/03/2018 0.1  0.0 - 0.1 K/uL Final  . Immature Granulocytes 03/03/2018 0  % Final  . Abs Immature Granulocytes 03/03/2018 0.03  0.00 -  0.07 K/uL Final  Performed at [facility], [address] [place], [place] [address]    (this displays the last labs from the last 3 days)  No results found for: TOTALPROTELP, ALBUMINELP, A1GS, A2GS, BETS, BETA2SER, GAMS, MSPIKE, SPEI (this displays SPEP labs)  No results found for: KPAFRELGTCHN, LAMBDASER, KAPLAMBRATIO (kappa/lambda light chains)  No results found for: HGBA, HGBA2QUANT, HGBFQUANT, HGBSQUAN (Hemoglobinopathy evaluation)   No results found for: LDH  No results found for: IRON, TIBC, IRONPCTSAT (Iron and TIBC)  No results found for: FERRITIN  Urinalysis    Component Value Date/Time   COLORURINE BROWN (A) 07/17/2011 1722   APPEARANCEUR TURBID (A) 07/17/2011 1722   LABSPEC 1.025 07/07/2012 1159   PHURINE 6.5 07/07/2012 1159   GLUCOSEU 100 (A) 07/07/2012 1159   HGBUR LARGE (A) 07/07/2012 1159   BILIRUBINUR SMALL (A) 07/07/2012 1159   KETONESUR NEGATIVE 07/07/2012 1159   PROTEINUR 100 (A) 07/07/2012 1159   UROBILINOGEN 0.2 07/07/2012 1159   NITRITE POSITIVE (A) 07/07/2012 1159   LEUKOCYTESUR NEGATIVE 07/07/2012 1159     STUDIES: US Breast Ltd Uni Left Inc Axilla  Result Date: 02/16/2018 CLINICAL DATA:  Screening recall for a possible left breast mass. EXAM: DIGITAL DIAGNOSTIC LEFT MAMMOGRAM WITH CAD AND TOMO ULTRASOUND LEFT BREAST COMPARISON:  Previous exam(s). ACR Breast Density Category c: The breast tissue is heterogeneously dense, which may obscure small masses. FINDINGS: The possible mass in the upper outer left breast persists on the diagnostic spot-compression images. Mass is lobulated and partly circumscribed. There several smaller nodular opacities in the left breast that similar to prior mammograms. There is postsurgical architectural distortion in the upper outer quadrant. No suspicious calcifications. Mammographic images were processed with CAD. On physical exam, a small firm mass is palpated the left breast near 2 o'clock.  There is a generalized nodular texture to tissue throughout the upper outer and lateral aspects of left breast. Targeted ultrasound is performed, showing a hypoechoic mass with partly lobulated partly ill-defined margins in the left breast at 2 o'clock, 5 cm from the nipple, measuring 13 x 10 x 10 mm. There is internal blood flow on color Doppler analysis. In the 3:30 o'clock position, 6 cm the nipple, there is a less well-defined hypoechoic mass that measures 12 x 7 x 12 mm. Sonographic evaluation of the left axilla shows no enlarged or abnormal lymph nodes. IMPRESSION: 1. Two left breast masses, 1 at 2 o'clock and the other at 3:30 o'clock. The 2 o'clock mass corresponds to the mammographic abnormality and is highly suspicious for breast carcinoma. The other mass is suspicious for breast carcinoma. No left axillary adenopathy. RECOMMENDATION: 1. Ultrasound-guided core needle biopsy both the 2 o'clock and 3:30 o'clock position left breast masses. I have discussed the findings and recommendations with the patient. Results were also provided in writing at the conclusion of the visit. If applicable, a reminder letter will be sent to the patient regarding the next appointment. BI-RADS CATEGORY  5: Highly suggestive of malignancy. Electronically Signed   By: Lajean Manes M.D.   On: 02/16/2018 09:32   Mm Diag Breast Tomo Uni Left  Result Date: 02/16/2018 CLINICAL DATA:  Screening recall for a possible left breast mass. EXAM: DIGITAL DIAGNOSTIC LEFT MAMMOGRAM WITH CAD AND TOMO ULTRASOUND LEFT BREAST COMPARISON:  Previous exam(s). ACR Breast Density Category c: The breast tissue is heterogeneously dense, which may obscure small masses. FINDINGS: The possible mass in the upper outer left breast persists on the diagnostic spot-compression images. Mass is lobulated and  partly circumscribed. There several smaller nodular opacities in the left breast that similar to prior mammograms. There is postsurgical architectural  distortion in the upper outer quadrant. No suspicious calcifications. Mammographic images were processed with CAD. On physical exam, a small firm mass is palpated the left breast near 2 o'clock. There is a generalized nodular texture to tissue throughout the upper outer and lateral aspects of left breast. Targeted ultrasound is performed, showing a hypoechoic mass with partly lobulated partly ill-defined margins in the left breast at 2 o'clock, 5 cm from the nipple, measuring 13 x 10 x 10 mm. There is internal blood flow on color Doppler analysis. In the 3:30 o'clock position, 6 cm the nipple, there is a less well-defined hypoechoic mass that measures 12 x 7 x 12 mm. Sonographic evaluation of the left axilla shows no enlarged or abnormal lymph nodes. IMPRESSION: 1. Two left breast masses, 1 at 2 o'clock and the other at 3:30 o'clock. The 2 o'clock mass corresponds to the mammographic abnormality and is highly suspicious for breast carcinoma. The other mass is suspicious for breast carcinoma. No left axillary adenopathy. RECOMMENDATION: 1. Ultrasound-guided core needle biopsy both the 2 o'clock and 3:30 o'clock position left breast masses. I have discussed the findings and recommendations with the patient. Results were also provided in writing at the conclusion of the visit. If applicable, a reminder letter will be sent to the patient regarding the next appointment. BI-RADS CATEGORY  5: Highly suggestive of malignancy. Electronically Signed   By: Lajean Manes M.D.   On: 02/16/2018 09:32   Mm Clip Placement Left  Result Date: 02/19/2018 CLINICAL DATA:  Status post ultrasound-guided core needle biopsies of a 1.2 cm mass in the 3:30 o'clock position of the left breast and a 1.3 cm mass in the 2 o'clock position of the left breast. EXAM: DIAGNOSTIC LEFT MAMMOGRAM POST ULTRASOUND BIOPSY X 2 COMPARISON:  Previous exam(s). FINDINGS: Mammographic images were obtained following ultrasound guided biopsy of the recently  demonstrated 1.2 cm mass in the 3:30 o'clock position of the left breast and 1.3 cm mass in the 2 o'clock position of the left breast. These demonstrate a ribbon shaped biopsy marker clip at the expected location of the biopsied mass in 3:30 o'clock position of the breast and a coil shaped biopsy marker clip at the expected location of the biopsied mass in the 2 o'clock position the left breast. The clips are located 4.4 cm apart. IMPRESSION: Appropriate clip deployment following 2 left breast ultrasound-guided core needle biopsies. Final Assessment: Post Procedure Mammograms for Marker Placement Electronically Signed   By: Claudie Revering M.D.   On: 02/19/2018 16:46   [place] Lt Breast Bx W Loc Dev 1st Lesion Img Bx Spec US Guide  Addendum Date: 02/23/2018   ADDENDUM REPORT: 02/23/2018 09:50 ADDENDUM: Pathology revealed GRADE II-III INVASIVE MAMMARY CARCINOMA of the Left breast, 3:30 o'clock. 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 This was found to be concordant by Dr. Claudie Revering. Pathology results were discussed with the patient by telephone. The patient reported doing well after the biopsies with tenderness at the sites. Post biopsy instructions and care were reviewed and questions were answered. The patient was encouraged to call [facility] for any additional concerns. The patient was referred to [facility] on March 03, 2018. Pathology results reported by Terie Purser, RN on 02/23/2018. Electronically Signed   By: Remo Lipps  Joneen Caraway M.D.   On: 02/23/2018 09:50   Result Date: 02/23/2018 CLINICAL DATA:  1.2 cm mass in the 3:30 o'clock position of the left breast and 1.3 cm mass in the 2 o'clock position of the left breast at recent ultrasound, both highly suspicious for malignancy. EXAM: ULTRASOUND GUIDED LEFT BREAST CORE NEEDLE BIOPSY X 2 COMPARISON:  Previous exam(s). FINDINGS: I met  with the patient and we discussed the procedure of ultrasound-guided biopsy, including benefits and alternatives. We discussed the high likelihood of a successful procedure. We discussed the risks of the procedure, including infection, bleeding, tissue injury, clip migration, and inadequate sampling. Informed written consent was given. The usual time-out protocol was performed immediately prior to the procedure. SITE #1: LEFT BREAST 3:30 O'CLOCK Lesion quadrant: Lower outer quadrant Using sterile technique and 1% Lidocaine as local anesthetic, under direct ultrasound visualization, a 12 gauge spring-loaded device was used to perform biopsy of the recently demonstrated 1.2 cm mass in the 3:30 o'clock position of the left breast using a caudal approach. At the conclusion of the procedure a ribbon shaped tissue marker clip was deployed into the biopsy cavity. Follow up 2 view mammogram was performed and dictated separately. SITE #2: LEFT BREAST 2 O'CLOCK Lesion quadrant: Upper outer quadrant Using sterile technique and 1% Lidocaine as local anesthetic, under direct ultrasound visualization, a 12 gauge spring-loaded device was used to perform biopsy of the recently demonstrated 1.3 cm mass in the 2 o'clock position of the left breast using a caudal approach. At the conclusion of the procedure a coil shaped tissue marker clip was deployed into the biopsy cavity. Follow up 2 view mammogram was performed and dictated separately. IMPRESSION: Ultrasound guided biopsy of the recently demonstrated 1 2 cm mass in the 3:30 o'clock position of the left breast and 1.3 cm mass in the 2 o'clock position of the left breast. No apparent complications. Electronically Signed: By: Claudie Revering M.D. On: 02/19/2018 16:26   [place] Lt Breast Bx W Loc Dev Ea Add Lesion Img Bx Spec US Guide  Addendum Date: 02/23/2018   ADDENDUM REPORT: 02/23/2018 09:50 ADDENDUM: Pathology revealed GRADE II-III INVASIVE MAMMARY CARCINOMA of the Left breast,  3:30 o'clock. 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 This was found to be concordant by Dr. Claudie Revering. Pathology results were discussed with the patient by telephone. The patient reported doing well after the biopsies with tenderness at the sites. Post biopsy instructions and care were reviewed and questions were answered. The patient was encouraged to call [facility] for any additional concerns. The patient was referred to [facility] on March 03, 2018. Pathology results reported by Terie Purser, RN on 02/23/2018. Electronically Signed   By: Claudie Revering M.D.   On: 02/23/2018 09:50   Result Date: 02/23/2018 CLINICAL DATA:  1.2 cm mass in the 3:30 o'clock position of the left breast and 1.3 cm mass in the 2 o'clock position of the left breast at recent ultrasound, both highly suspicious for malignancy. EXAM: ULTRASOUND GUIDED LEFT BREAST CORE NEEDLE BIOPSY X 2 COMPARISON:  Previous exam(s). FINDINGS: I met with the patient and we discussed the procedure of ultrasound-guided biopsy, including benefits and alternatives. We discussed the high likelihood of a successful procedure. We discussed the risks of the procedure, including infection, bleeding, tissue injury, clip migration, and inadequate sampling. Informed written consent was given. The usual time-out protocol was performed  immediately prior to the procedure. SITE #1: LEFT BREAST 3:30 O'CLOCK Lesion quadrant: Lower outer quadrant Using sterile technique and 1% Lidocaine as local anesthetic, under direct ultrasound visualization, a 12 gauge spring-loaded device was used to perform biopsy of the recently demonstrated 1.2 cm mass in the 3:30 o'clock position of the left breast using a caudal approach. At the conclusion of the procedure a ribbon shaped tissue marker clip was deployed into the biopsy cavity. Follow up 2 view  mammogram was performed and dictated separately. SITE #2: LEFT BREAST 2 O'CLOCK Lesion quadrant: Upper outer quadrant Using sterile technique and 1% Lidocaine as local anesthetic, under direct ultrasound visualization, a 12 gauge spring-loaded device was used to perform biopsy of the recently demonstrated 1.3 cm mass in the 2 o'clock position of the left breast using a caudal approach. At the conclusion of the procedure a coil shaped tissue marker clip was deployed into the biopsy cavity. Follow up 2 view mammogram was performed and dictated separately. IMPRESSION: Ultrasound guided biopsy of the recently demonstrated 1 2 cm mass in the 3:30 o'clock position of the left breast and 1.3 cm mass in the 2 o'clock position of the left breast. No apparent complications. Electronically Signed: By: Claudie Revering M.D. On: 02/19/2018 16:26    ELIGIBLE FOR AVAILABLE RESEARCH PROTOCOL: Upbeat  ASSESSMENT: 68 y.o. 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, grade 2, estrogen and progesterone receptor positive, HER-2 not amplified, with an MIB-1 of 5%  (b) in the lower outer quadrant a clinical T1c N0, stage IA-B invasive ductal carcinoma, grade 2, estrogen receptor only moderately positive at 10%, progesterone receptor negative, with an MIB-1 of 40%, and HER-2 not amplified  (1) neoadjuvant chemotherapy will consist of doxorubicin and cyclophosphamide in dose dense fashion x4 starting 03/16/2018, followed by paclitaxel and carboplatin weekly x12  (2) definitive surgery to follow  (3) adjuvant radiation as appropriate  (4) antiestrogens to follow at the completion of local treatment (for upper outer quadrant tumor)  (5) genetics testing pending  PLAN: I spent approximately 60 minutes face to face with Angellica with more than 50% of that time spent in counseling and coordination of care.  Specifically we reviewed the biology of the patient's diagnosis and the specifics of her situation.  We first reviewed the fact that cancer is not one disease but more than 100 different diseases and that it is important to keep them separate-- otherwise when friends and relatives discuss their own cancer experiences with Kelicia confusion can result. Similarly we explained that if breast cancer spreads to the bone or liver, the patient would not have bone cancer or liver cancer, but breast cancer in the bone and breast cancer in the liver: one cancer in three places-- not 3 different cancers which otherwise would have to be treated in 3 different ways.  We discussed the difference between local and systemic therapy. In terms of loco-regional treatment, lumpectomy plus radiation is equivalent to mastectomy as far as survival is concerned. For this reason, and because the cosmetic results are generally superior, we recommend breast conserving surgery.   We also noted that in terms of sequencing of treatments, whether systemic therapy or surgery is done first does not affect the ultimate outcome.  This is relevant to Lennan's situation since we are recommending neoadjuvant chemotherapy in her case.  This will optimize the chance of her  keeping her breast.  We then discussed the rationale for systemic therapy. There is some risk that this cancer may have already spread to other parts of her body. Patients frequently ask at this point about bone scans, CAT scans and PET scans to find out if they have occult breast cancer somewhere else. The problem is that in early stage disease we are much more likely to find false positives then true cancers and this would expose the patient to unnecessary procedures as well as unnecessary radiation. Scans cannot answer the question the patient really would like to know, which is whether she has microscopic disease elsewhere in her body. For those reasons we do not recommend  them.  Of course we would proceed to aggressive evaluation of any symptoms that might suggest metastatic disease, but that is not the case here.  Next we went over the options for systemic therapy which are anti-estrogens, anti-HER-2 immunotherapy, and chemotherapy.  The situation here is complex because Addaleigh has 2 synchronous cancers.  One is clearly estrogen and progesterone receptor positive with a low proliferation fraction and if that were the only cancer we had we would obtain an Oncotype  However the other cancer is progesterone receptor negative and only 10% estrogen receptor positive with moderate staining.  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  Accordingly the plan will be to start with chemotherapy.  Today we discussed the possible toxicity side effects and complications of the agents that we are going to be using which will include carboplatin (special dosing may be needed because of her kidney concerns).  She will discuss this further with her chemotherapy teaching nurse.  After completing chemotherapy she will proceed to surgery, then radiation.  She will start antiestrogens at the completion of local treatment and continue them for a minimum of 5 years  Audie has a good understanding of the overall plan. She agrees with it. She knows the goal of treatment in her case is cure. She will call with any problems that may develop before her next visit here.  Chauncey Cruel, MD   03/03/2018 3:06 PM Medical Oncology and Hematology [facility] [address] [place], [place] [address] Tel. [phone]    Fax. [phone]  This document serves as a record of services personally performed by Lurline Del, MD. It was created on his behalf by Wilburn Mylar, a trained medical scribe. The creation of this record is based on the  scribe's personal observations and the provider's statements to them.   I, Lurline Del MD, have reviewed the above documentation for accuracy and completeness, and I agree with the above.

## 2018-03-03 ENCOUNTER — Other Ambulatory Visit: Payer: Self-pay | Admitting: General Surgery

## 2018-03-03 ENCOUNTER — Other Ambulatory Visit: Payer: Self-pay

## 2018-03-03 ENCOUNTER — Telehealth: Payer: Self-pay | Admitting: Oncology

## 2018-03-03 ENCOUNTER — Encounter: Payer: Self-pay | Admitting: General Practice

## 2018-03-03 ENCOUNTER — Encounter: Payer: Self-pay | Admitting: Physical Therapy

## 2018-03-03 ENCOUNTER — Encounter: Payer: Self-pay | Admitting: Oncology

## 2018-03-03 ENCOUNTER — Ambulatory Visit
Admission: RE | Admit: 2018-03-03 | Discharge: 2018-03-03 | Disposition: A | Discharge status: Home / Self Care | Payer: Medicare Other | Source: Ambulatory Visit | Attending: Radiation Oncology | Admitting: Radiation Oncology

## 2018-03-03 ENCOUNTER — Inpatient Hospital Stay: Payer: Medicare Other | Attending: Oncology | Admitting: Oncology

## 2018-03-03 ENCOUNTER — Ambulatory Visit: Payer: Medicare Other | Attending: General Surgery | Admitting: Physical Therapy

## 2018-03-03 ENCOUNTER — Inpatient Hospital Stay: Payer: Medicare Other

## 2018-03-03 VITALS — BP 139/74 | HR 19 | Temp 98.3°F | Resp 18 | Ht 62.0 in | Wt 175.5 lb

## 2018-03-03 DIAGNOSIS — Z923 Personal history of irradiation: Secondary | ICD-10-CM

## 2018-03-03 DIAGNOSIS — E860 Dehydration: Secondary | ICD-10-CM | POA: Insufficient documentation

## 2018-03-03 DIAGNOSIS — Z170 Estrogen receptor positive status [ER+]: Secondary | ICD-10-CM

## 2018-03-03 DIAGNOSIS — C50412 Malignant neoplasm of upper-outer quadrant of left female breast: Secondary | ICD-10-CM

## 2018-03-03 DIAGNOSIS — E876 Hypokalemia: Secondary | ICD-10-CM | POA: Diagnosis not present

## 2018-03-03 DIAGNOSIS — K219 Gastro-esophageal reflux disease without esophagitis: Secondary | ICD-10-CM | POA: Diagnosis not present

## 2018-03-03 DIAGNOSIS — Z171 Estrogen receptor negative status [ER-]: Secondary | ICD-10-CM

## 2018-03-03 DIAGNOSIS — C50512 Malignant neoplasm of lower-outer quadrant of left female breast: Secondary | ICD-10-CM

## 2018-03-03 DIAGNOSIS — K30 Functional dyspepsia: Secondary | ICD-10-CM | POA: Insufficient documentation

## 2018-03-03 DIAGNOSIS — Z5189 Encounter for other specified aftercare: Secondary | ICD-10-CM | POA: Insufficient documentation

## 2018-03-03 DIAGNOSIS — R51 Headache: Secondary | ICD-10-CM | POA: Insufficient documentation

## 2018-03-03 DIAGNOSIS — Z5111 Encounter for antineoplastic chemotherapy: Secondary | ICD-10-CM | POA: Diagnosis not present

## 2018-03-03 DIAGNOSIS — R293 Abnormal posture: Secondary | ICD-10-CM | POA: Insufficient documentation

## 2018-03-03 DIAGNOSIS — Z9221 Personal history of antineoplastic chemotherapy: Secondary | ICD-10-CM | POA: Diagnosis not present

## 2018-03-03 LAB — CBC WITH DIFFERENTIAL (CANCER CENTER ONLY)
Abs Immature Granulocytes: 0.03 10*3/uL (ref 0.00–0.07)
BASOS PCT: 1 %
Basophils Absolute: 0.1 10*3/uL (ref 0.0–0.1)
EOS ABS: 0.1 10*3/uL (ref 0.0–0.5)
EOS PCT: 2 %
HEMATOCRIT: 39.1 % (ref 36.0–46.0)
Hemoglobin: 12 g/dL (ref 12.0–15.0)
Immature Granulocytes: 0 %
LYMPHS ABS: 2.8 10*3/uL (ref 0.7–4.0)
Lymphocytes Relative: 35 %
MCH: 27.3 pg (ref 26.0–34.0)
MCHC: 30.7 g/dL (ref 30.0–36.0)
MCV: 89.1 fL (ref 80.0–100.0)
MONOS PCT: 5 %
Monocytes Absolute: 0.4 10*3/uL (ref 0.1–1.0)
NEUTROS PCT: 57 %
Neutro Abs: 4.5 10*3/uL (ref 1.7–7.7)
PLATELETS: 212 10*3/uL (ref 150–400)
RBC: 4.39 MIL/uL (ref 3.87–5.11)
RDW: 13 % (ref 11.5–15.5)
WBC Count: 8 10*3/uL (ref 4.0–10.5)
nRBC: 0 % (ref 0.0–0.2)

## 2018-03-03 LAB — CMP (CANCER CENTER ONLY)
ALT: 19 U/L (ref 0–44)
ANION GAP: 9 (ref 5–15)
AST: 14 U/L — ABNORMAL LOW (ref 15–41)
Albumin: 4 g/dL (ref 3.5–5.0)
Alkaline Phosphatase: 105 U/L (ref 38–126)
BILIRUBIN TOTAL: 0.4 mg/dL (ref 0.3–1.2)
BUN: 15 mg/dL (ref 8–23)
CALCIUM: 10.3 mg/dL (ref 8.9–10.3)
CO2: 28 mmol/L (ref 22–32)
CREATININE: 0.8 mg/dL (ref 0.44–1.00)
Chloride: 108 mmol/L (ref 98–111)
GFR, Estimated: >60 mL/min (ref >60)
GLUCOSE: 157 mg/dL — AB (ref 70–99)
Potassium: 3.6 mmol/L (ref 3.5–5.1)
Sodium: 145 mmol/L (ref 135–145)
TOTAL PROTEIN: 7.7 g/dL (ref 6.5–8.1)

## 2018-03-03 NOTE — Patient Instructions (Signed)

## 2018-03-03 NOTE — Progress Notes (Signed)
UPBEAT  Referred by Dr. Jana Hakim who felt this patient would be a good candidate for study. This RN and Waunita Schooner spoke with patient for about 15 minutes about UPBEAT clinical trial. Gave patient a brief overview of clinical trial. Provided patient with a copy of informed consent, HIPAA form, UPBEAT brochure, clinical trials brochure and this RN's business card. Patient confirmed that she was not claustrophobic and could hold her breath for 10 seconds. Patient stated that it was "alright" to follow up with her the week of 03/08/2018 to see if she is interested. Pt was thanked for her time and interest.  Johney Maine RN, BSN Clinical Research  03/03/2018 1455

## 2018-03-03 NOTE — Therapy (Signed)
[place], [place], [address] Phone: [phone]   Fax:  [phone]  Physical Therapy Evaluation  Patient Details  Name: Emily Chambers MRN: [id number] Date of Birth: 06/14/1950 Referring Provider (PT): Dr. Stark Klein   Encounter Date: 03/03/2018  PT End of Session - 03/03/18 1505    Visit Number  1    Number of Visits  1    PT Start Time  1424    PT Stop Time  1446    PT Time Calculation (min)  22 min    Activity Tolerance  Patient tolerated treatment well    Behavior During Therapy  [facility] for tasks assessed/performed       Past Medical History:  Diagnosis Date  . High cholesterol   . Hypertension   . Kidney stone   . Kidney stones   . UTI (lower urinary tract infection)     Past Surgical History:  Procedure Laterality Date  . BREAST EXCISIONAL BIOPSY Left 1999,2005   cysts removed  . BREAST SURGERY Left    cyst and biopsy    There were no vitals filed for this visit.   Subjective Assessment - 03/03/18 1447    Subjective  Patient reports she is here today to be seen by her medical team for her newly diagnosed breast cancer.    Pertinent History  Patient was diagnosed on 02/10/18 with left invasive mammary carcinoma breast cancer. There are 2 masses measuring 1.3 cm in the lower outer quadrant and 1.2 cm in the upper outer quadrant. It is ER/PR positive and HER2 negative with a Ki67 of 40%.    Patient Stated Goals  Reduce lymphedema risk and learn post op shoulder ROM HEP    Currently in Pain?  No/denies         [facility] PT Assessment - 03/03/18 0001      Assessment   Medical Diagnosis  Left breast cancer    Referring Provider (PT)  Dr. Stark Klein    Onset Date/Surgical Date  02/10/18    Hand Dominance  Right    Prior Therapy  none      Precautions   Precautions  Other (comment)    Precaution Comments  active cancer      Restrictions   Weight Bearing Restrictions  No       Balance Screen   Has the patient fallen in the past 6 months  No    Has the patient had a decrease in activity level because of a fear of falling?   No    Is the patient reluctant to leave their home because of a fear of falling?   No      Home Environment   Living Environment  Private residence    Living Arrangements  Alone    Available Help at Discharge  Family      Prior Function   Level of Independence  Independent    Vocation  Part time employment    IT consultant at [employer]  She walks some - usually 2-3x/week for 1 hour      Cognition   Overall Cognitive Status  Within Functional Limits for tasks assessed      Posture/Postural Control   Posture/Postural Control  Postural limitations    Postural Limitations  Rounded Shoulders;Forward head      ROM / Strength   AROM / PROM / Strength  AROM;Strength  AROM   AROM Assessment Site  Shoulder;Cervical    Right/Left Shoulder  Right;Left    Right Shoulder Extension  54 Degrees    Right Shoulder Flexion  151 Degrees    Right Shoulder ABduction  159 Degrees    Right Shoulder Internal Rotation  72 Degrees    Right Shoulder External Rotation  75 Degrees    Left Shoulder Extension  52 Degrees    Left Shoulder Flexion  143 Degrees    Left Shoulder ABduction  165 Degrees    Left Shoulder Internal Rotation  68 Degrees    Left Shoulder External Rotation  70 Degrees    Cervical Flexion  WNL    Cervical Extension  WNL    Cervical - Right Side Bend  WNL    Cervical - Left Side Bend  WNL    Cervical - Right Rotation  WNL    Cervical - Left Rotation  WNL      Strength   Overall Strength  Within functional limits for tasks performed        LYMPHEDEMA/ONCOLOGY QUESTIONNAIRE - 03/03/18 1504      Type   Cancer Type  Left breast cancer      Lymphedema Assessments   Lymphedema Assessments  Upper extremities      Right Upper Extremity Lymphedema   10 cm Proximal to Olecranon  Process  31.4 cm    Olecranon Process  27.3 cm    10 cm Proximal to Ulnar Styloid Process  24.6 cm    Just Proximal to Ulnar Styloid Process  18.2 cm    Across Hand at [employer]  19.8 cm    At [place] of 2nd Digit  6.7 cm      Left Upper Extremity Lymphedema   10 cm Proximal to Olecranon Process  31.2 cm    Olecranon Process  28 cm    10 cm Proximal to Ulnar Styloid Process  23.3 cm    Just Proximal to Ulnar Styloid Process  18.2 cm    Across Hand at [employer]  19 cm    At [place] of 2nd Digit  6.7 cm             Objective measurements completed on examination: See above findings.     Patient was instructed today in a home exercise program today for post op shoulder range of motion. These included active assist shoulder flexion in sitting, scapular retraction, wall walking with shoulder abduction, and hands behind head external rotation.  She was encouraged to do these twice a day, holding 3 seconds and repeating 5 times when permitted by her physician.             PT Education - 03/03/18 1505    Education Details  Lymphedema risk reduction and post op shoulder ROM HEP    Person(s) Educated  Patient    Methods  Explanation;Demonstration;Handout    Comprehension  Returned demonstration;Verbalized understanding           Breast Clinic Goals - 03/03/18 1534      Patient will be able to verbalize understanding of pertinent lymphedema risk reduction practices relevant to her diagnosis specifically related to skin care.   Time  1    Period  Days    Status  Achieved      Patient will be able to return demonstrate and/or verbalize understanding of the post-op home exercise program related to regaining shoulder range of motion.   Time  1    Period  Days    Status  Achieved      Patient will be able to verbalize understanding of the importance of attending the postoperative After Breast Cancer Class for further lymphedema risk reduction education and  therapeutic exercise.   Time  1    Period  Days    Status  Achieved            Plan - 03/03/18 1506    Clinical Impression Statement  Patient was diagnosed on 02/10/18 with left invasive mammary carcinoma breast cancer. There are 2 masses measuring 1.3 cm in the lower outer quadrant and 1.2 cm in the upper outer quadrant. It is ER/PR positive and HER2 negative with a Ki67 of 40%. Her multidisciplinary medical team met prior to her assessments to determine a recommended treatment plan. She is planning to have neoadjuvant chemotherapy followed by breast surgery with a sentinel node biopsy, radiation, and anti-estrogen therapy. She will benefit from a post op PT assessment to determine needs.    History and Personal Factors relevant to plan of care:  Lives alone    Clinical Presentation  Stable    Clinical Decision Making  Low    Rehab Potential  Excellent    Clinical Impairments Affecting Rehab Potential  None    PT Frequency  One time visit    PT Treatment/Interventions  ADLs/Self Care Home Management;Patient/family education;Therapeutic exercise    PT Next Visit Plan  Will reassess if MD refers her back to PT    PT Home Exercise Plan  Post op shoulder ROM HEP    Consulted and Agree with Plan of Care  Patient       Patient will benefit from skilled therapeutic intervention in order to improve the following deficits and impairments:  Decreased range of motion, Impaired UE functional use, Decreased knowledge of precautions, Postural dysfunction, Pain  Visit Diagnosis: Malignant neoplasm of lower-outer quadrant of left breast of female, estrogen receptor positive ([place]) - Plan: PT plan of care cert/re-cert  Malignant neoplasm of upper-outer quadrant of left breast in female, estrogen receptor positive ([place]) - Plan: PT plan of care cert/re-cert  Abnormal posture - Plan: PT plan of care cert/re-cert   Patient will follow up at outpatient cancer rehab 3-4 weeks following surgery.  If the  patient requires physical therapy at that time, a specific plan will be dictated and sent to the referring physician for approval. The patient was educated today on appropriate basic range of motion exercises to begin post operatively and the importance of attending the After Breast Cancer class following surgery.  Patient was educated today on lymphedema risk reduction practices as it pertains to recommendations that will benefit the patient immediately following surgery.  She verbalized good understanding.     Problem List Patient Active Problem List   Diagnosis Date Noted  . Malignant neoplasm of lower-outer quadrant of left breast of female, estrogen receptor negative ([place]) 02/26/2018  . Malignant neoplasm of upper-outer quadrant of left breast in female, estrogen receptor positive ([place]) 02/26/2018  . Essential hypertension 10/23/2014  . Hyperlipidemia 10/23/2014   Annia Friendly, PT 03/03/18 3:39 PM  [place], [place], [address] Phone: [phone]   Fax:  [phone]  Name: Emily Chambers MRN: [id number] Date of Birth: 1950/03/10

## 2018-03-03 NOTE — Progress Notes (Signed)
[facility] Psychosocial Distress Screening Spiritual Care  Met with Emily Chambers in Breast Multidisciplinary Clinic to introduce Support Center team/resources, reviewing distress screen per protocol.  The patient scored a 5 on the Psychosocial Distress Thermometer which indicates moderate distress. Also assessed for distress and other psychosocial needs.   ON[facility] DISTRESS SCREENING 03/03/2018  Screening Type Initial Screening  Distress experienced in past week (1-10) 5  Emotional problem type Adjusting to illness  Referral to support programs Yes   Ms Emily Chambers reports supportive family (esp sister and niece) and church; she plans to share about her dx with more people now that she has met her team and has a plan. Resolving the unknown helped decrease her anxiety. Normalized feelings, introducing Support Center team/programming. Encouraged participation and self-care to help with adjusting to illness. Emily Chambers is particularly interested in Look Good, Feel Better for coping with hair loss.  Follow up needed: No.  Per pt, no other needs at this time, but she knows to contact Team whenever needed/desired. Please also page if immediate needs arise or circumstances change. Thank you.   Chaplain Lisa Lundeen, MDiv, [facility] Pager [phone] Voicemail [phone]      

## 2018-03-03 NOTE — Telephone Encounter (Signed)
Gave avs and calendar per MD no los

## 2018-03-03 NOTE — Progress Notes (Signed)
START ON PATHWAY REGIMEN - Breast     A cycle is every 14 days (cycles 1-4):     Doxorubicin      Cyclophosphamide      Pegfilgrastim-xxxx    A cycle is every 21 days (cycles 5-8):     Paclitaxel      Carboplatin   **Always confirm dose/schedule in your pharmacy ordering system**  Patient Characteristics: Preoperative or Nonsurgical Candidate (Clinical Staging), Neoadjuvant Therapy followed by Surgery, Invasive Disease, Chemotherapy, HER2 Negative/Unknown/Equivocal, ER Negative/Unknown, Platinum Therapy Indicated Therapeutic Status: Preoperative or Nonsurgical Candidate (Clinical Staging) AJCC M Category: cM0 AJCC Grade: G2 Breast Surgical Plan: Neoadjuvant Therapy followed by Surgery ER Status: Negative (-) AJCC 8 Stage Grouping: IA HER2 Status: Negative (-) AJCC T Category: cT1c AJCC N Category: cN0 PR Status: Positive (+) Type of Therapy: Platinum Therapy Indicated Intent of Therapy: Curative Intent, Discussed with Patient

## 2018-03-03 NOTE — Progress Notes (Signed)
Radiation Oncology         [phone] ________________________________  Multidisciplinary Breast Oncology Clinic [facility]) Initial Outpatient Consultation  Name: Emily Chambers MRN: [id number]  Date: 03/03/2018  DOB: 28-Jun-1950  CC:Gaynelle Arabian, MD  Stark Klein, MD   REFERRING PHYSICIAN: Stark Klein, MD  DIAGNOSIS: The primary encounter diagnosis was Malignant neoplasm of upper-outer quadrant of left breast in female, estrogen receptor positive ([place]). A diagnosis of Malignant neoplasm of lower-outer quadrant of left breast of female, estrogen receptor negative ([place]) was also pertinent to this visit.   Multifocal left breast cancer  Stage IA (cT1c, cN0, cM0), invasive ductal carcinoma of left Breast,  UOQ 2o'clock, ER +, PR -, HER2 -, grade 2  Stage IA (cT1c, cN0, cM0) left Breast LOQ invasive mammary carcinoma, ER+ / PR+ / Her2-, Grade 2-3    ICD-10-CM   1. Malignant neoplasm of upper-outer quadrant of left breast in female, estrogen receptor positive (HCC) C50.412    Z17.0   2. Malignant neoplasm of lower-outer quadrant of left breast of female, estrogen receptor negative ([place]) C50.512    Z17.1     HISTORY OF PRESENT ILLNESS::Emily Chambers is a 68 y.o. female who is presenting to the office today for evaluation of her newly diagnosed breast cancer. She is doing well overall.   She had routine screening mammography on 02/10/18 showing a possible abnormality in the left breast. She underwent bilateral diagnostic mammography with tomography and left breast ultrasonography at [facility] on 02/16/18 showing: two left breast masses, 1 at 2 o'clock and the other at 3:30 o'clock. Both masses were noted to be suspicious for carcinoma.   Accordingly on 02/19/2018 she proceeded to biopsy of the left breast area in question. The pathology from this procedure ([id number]) showed: 1) 3:30 o'clock specimen showed invasive mammary carcinoma, with staining suggestive of a ductal  phenotype, grade 2-3. Prognostic indicators significant for: estrogen receptor, 90% positive and progesterone receptor, 95% positive, both with strong staining intensity. Proliferation marker Ki67 at 5%. HER2 negative (1+). 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 Prognostic indicators significant for: estrogen receptor, 10% positive with moderate staining intensity and progesterone receptor, 0% negative. Proliferation marker Ki67 at 40%. HER2 negative (1+).  Menarche: 68 years old Age at first live birth: N/A GP: GxPx LMP: 2010 Contraceptive: N/A HRT: no   The patient was referred today for presentation in the multidisciplinary conference.  Radiology studies and pathology slides were presented there for review and discussion of treatment options.  A consensus was discussed regarding potential next steps.  PREVIOUS RADIATION THERAPY: No  PAST MEDICAL HISTORY:  has a past medical history of High cholesterol, Hypertension, Kidney stone, Kidney stones, and UTI (lower urinary tract infection).    PAST SURGICAL HISTORY: Past Surgical History:  Procedure Laterality Date  . BREAST EXCISIONAL BIOPSY Left 1999,2005   cysts removed  . BREAST SURGERY Left    cyst and biopsy    FAMILY HISTORY: family history includes Breast cancer in her cousin, cousin, and cousin; Colon cancer in her maternal grandmother and paternal grandmother; Dementia in her mother; Healthy in her father; Heart disease in her mother; Hypertension in her mother; Lung cancer in her cousin.  SOCIAL HISTORY:  reports that she has never smoked. She has never used smokeless tobacco. She reports current alcohol use. She reports that she does not use drugs.  ALLERGIES: Patient has no known allergies.  MEDICATIONS:  Current Outpatient Medications  Medication Sig Dispense Refill  .  amLODipine (NORVASC) 10 MG tablet Take 10 mg by mouth every morning.     Marland Kitchen aspirin 81 MG chewable tablet Chew 81 mg by mouth  daily.    Marland Kitchen losartan (COZAAR) 100 MG tablet Take 100 mg by mouth every morning.     . pravastatin (PRAVACHOL) 20 MG tablet Take 20 mg by mouth every morning.      No current facility-administered medications for this encounter.     REVIEW OF SYSTEMS:  REVIEW OF SYSTEMS: A 10+ POINT REVIEW OF SYSTEMS WAS OBTAINED including neurology, dermatology, psychiatry, cardiac, respiratory, lymph, extremities, GI, GU, musculoskeletal, constitutional, reproductive, HEENT.On the provided form, she reports no symptoms.    PHYSICAL EXAM:  Vitals with BMI 03/03/2018  Height '5\' 2"'$   Weight 175 lbs 8 oz  BMI 72.09  Systolic 470  Diastolic 74  Pulse 19  Respirations 18   Lungs are clear to auscultation bilaterally. Heart has regular rate and rhythm. No palpable cervical, supraclavicular, or axillary adenopathy. Abdomen soft, non-tender, normal bowel sounds. Breast: Right breast with no palpable mass, nipple discharge, or bleeding. Left breast with some induration in the 2 o'clock position consistent with the biopsy or tumor; estimated to be approximately 1 cm in size. Patient also has some bruising at the 3 o'clock position and some mild induration at this site. Left nipple is mildly inverted from previous breast biopsy by Dr. Bubba Camp many years ago.   ECOG = 0  0 - Asymptomatic (Fully active, able to carry on all predisease activities without restriction)  1 - Symptomatic but completely ambulatory (Restricted in physically strenuous activity but ambulatory and able to carry out work of a light or sedentary nature. For example, light housework, office work)  2 - Symptomatic, <50% in bed during the day (Ambulatory and capable of all self care but unable to carry out any work activities. Up and about more than 50% of waking hours)  3 - Symptomatic, >50% in bed, but not bedbound (Capable of only limited self-care, confined to bed or chair 50% or more of waking hours)  4 - Bedbound (Completely disabled. Cannot  carry on any self-care. Totally confined to bed or chair)  5 - Death   Eustace Pen MM, Creech RH, Tormey DC, et al. [phone]). "Toxicity and response criteria of the [facility]". [place] Oncol. 5 (6): 649-55  LABORATORY DATA:  Lab Results  Component Value Date   WBC 8.0 03/03/2018   HGB 12.0 03/03/2018   HCT 39.1 03/03/2018   MCV 89.1 03/03/2018   PLT 212 03/03/2018   Lab Results  Component Value Date   NA 145 03/03/2018   K 3.6 03/03/2018   CL 108 03/03/2018   CO2 28 03/03/2018   Lab Results  Component Value Date   ALT 19 03/03/2018   AST 14 (L) 03/03/2018   ALKPHOS 105 03/03/2018   BILITOT 0.4 03/03/2018    PULMONARY FUNCTION TEST:   Recent Review Flowsheet Data    There is no flowsheet data to display.      RADIOGRAPHY: US Breast Ltd Uni Left Inc Axilla  Result Date: 02/16/2018 CLINICAL DATA:  Screening recall for a possible left breast mass. EXAM: DIGITAL DIAGNOSTIC LEFT MAMMOGRAM WITH CAD AND TOMO ULTRASOUND LEFT BREAST COMPARISON:  Previous exam(s). ACR Breast Density Category c: The breast tissue is heterogeneously dense, which may obscure small masses. FINDINGS: The possible mass in the upper outer left breast persists on the diagnostic spot-compression images. Mass is lobulated and partly  circumscribed. There several smaller nodular opacities in the left breast that similar to prior mammograms. There is postsurgical architectural distortion in the upper outer quadrant. No suspicious calcifications. Mammographic images were processed with CAD. On physical exam, a small firm mass is palpated the left breast near 2 o'clock. There is a generalized nodular texture to tissue throughout the upper outer and lateral aspects of left breast. Targeted ultrasound is performed, showing a hypoechoic mass with partly lobulated partly ill-defined margins in the left breast at 2 o'clock, 5 cm from the nipple, measuring 13 x 10 x 10 mm. There is internal blood flow on  color Doppler analysis. In the 3:30 o'clock position, 6 cm the nipple, there is a less well-defined hypoechoic mass that measures 12 x 7 x 12 mm. Sonographic evaluation of the left axilla shows no enlarged or abnormal lymph nodes. IMPRESSION: 1. Two left breast masses, 1 at 2 o'clock and the other at 3:30 o'clock. The 2 o'clock mass corresponds to the mammographic abnormality and is highly suspicious for breast carcinoma. The other mass is suspicious for breast carcinoma. No left axillary adenopathy. RECOMMENDATION: 1. Ultrasound-guided core needle biopsy both the 2 o'clock and 3:30 o'clock position left breast masses. I have discussed the findings and recommendations with the patient. Results were also provided in writing at the conclusion of the visit. If applicable, a reminder letter will be sent to the patient regarding the next appointment. BI-RADS CATEGORY  5: Highly suggestive of malignancy. Electronically Signed   By: Lajean Manes M.D.   On: 02/16/2018 09:32   Mm Diag Breast Tomo Uni Left  Result Date: 02/16/2018 CLINICAL DATA:  Screening recall for a possible left breast mass. EXAM: DIGITAL DIAGNOSTIC LEFT MAMMOGRAM WITH CAD AND TOMO ULTRASOUND LEFT BREAST COMPARISON:  Previous exam(s). ACR Breast Density Category c: The breast tissue is heterogeneously dense, which may obscure small masses. FINDINGS: The possible mass in the upper outer left breast persists on the diagnostic spot-compression images. Mass is lobulated and partly circumscribed. There several smaller nodular opacities in the left breast that similar to prior mammograms. There is postsurgical architectural distortion in the upper outer quadrant. No suspicious calcifications. Mammographic images were processed with CAD. On physical exam, a small firm mass is palpated the left breast near 2 o'clock. There is a generalized nodular texture to tissue throughout the upper outer and lateral aspects of left breast. Targeted ultrasound is  performed, showing a hypoechoic mass with partly lobulated partly ill-defined margins in the left breast at 2 o'clock, 5 cm from the nipple, measuring 13 x 10 x 10 mm. There is internal blood flow on color Doppler analysis. In the 3:30 o'clock position, 6 cm the nipple, there is a less well-defined hypoechoic mass that measures 12 x 7 x 12 mm. Sonographic evaluation of the left axilla shows no enlarged or abnormal lymph nodes. IMPRESSION: 1. Two left breast masses, 1 at 2 o'clock and the other at 3:30 o'clock. The 2 o'clock mass corresponds to the mammographic abnormality and is highly suspicious for breast carcinoma. The other mass is suspicious for breast carcinoma. No left axillary adenopathy. RECOMMENDATION: 1. Ultrasound-guided core needle biopsy both the 2 o'clock and 3:30 o'clock position left breast masses. I have discussed the findings and recommendations with the patient. Results were also provided in writing at the conclusion of the visit. If applicable, a reminder letter will be sent to the patient regarding the next appointment. BI-RADS CATEGORY  5: Highly suggestive of malignancy. Electronically Signed  By: Lajean Manes M.D.   On: 02/16/2018 09:32   Mm Clip Placement Left  Result Date: 02/19/2018 CLINICAL DATA:  Status post ultrasound-guided core needle biopsies of a 1.2 cm mass in the 3:30 o'clock position of the left breast and a 1.3 cm mass in the 2 o'clock position of the left breast. EXAM: DIAGNOSTIC LEFT MAMMOGRAM POST ULTRASOUND BIOPSY X 2 COMPARISON:  Previous exam(s). FINDINGS: Mammographic images were obtained following ultrasound guided biopsy of the recently demonstrated 1.2 cm mass in the 3:30 o'clock position of the left breast and 1.3 cm mass in the 2 o'clock position of the left breast. These demonstrate a ribbon shaped biopsy marker clip at the expected location of the biopsied mass in 3:30 o'clock position of the breast and a coil shaped biopsy marker clip at the expected  location of the biopsied mass in the 2 o'clock position the left breast. The clips are located 4.4 cm apart. IMPRESSION: Appropriate clip deployment following 2 left breast ultrasound-guided core needle biopsies. Final Assessment: Post Procedure Mammograms for Marker Placement Electronically Signed   By: Claudie Revering M.D.   On: 02/19/2018 16:46   [place] Lt Breast Bx W Loc Dev 1st Lesion Img Bx Spec US Guide  Addendum Date: 02/23/2018   ADDENDUM REPORT: 02/23/2018 09:50 ADDENDUM: Pathology revealed GRADE II-III INVASIVE MAMMARY CARCINOMA of the Left breast, 3:30 o'clock. 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 This was found to be concordant by Dr. Claudie Revering. Pathology results were discussed with the patient by telephone. The patient reported doing well after the biopsies with tenderness at the sites. Post biopsy instructions and care were reviewed and questions were answered. The patient was encouraged to call [facility] for any additional concerns. The patient was referred to [facility] on March 03, 2018. Pathology results reported by Terie Purser, RN on 02/23/2018. Electronically Signed   By: Claudie Revering M.D.   On: 02/23/2018 09:50   Result Date: 02/23/2018 CLINICAL DATA:  1.2 cm mass in the 3:30 o'clock position of the left breast and 1.3 cm mass in the 2 o'clock position of the left breast at recent ultrasound, both highly suspicious for malignancy. EXAM: ULTRASOUND GUIDED LEFT BREAST CORE NEEDLE BIOPSY X 2 COMPARISON:  Previous exam(s). FINDINGS: I met with the patient and we discussed the procedure of ultrasound-guided biopsy, including benefits and alternatives. We discussed the high likelihood of a successful procedure. We discussed the risks of the procedure, including infection, bleeding, tissue injury, clip migration, and inadequate sampling. Informed written consent  was given. The usual time-out protocol was performed immediately prior to the procedure. SITE #1: LEFT BREAST 3:30 O'CLOCK Lesion quadrant: Lower outer quadrant Using sterile technique and 1% Lidocaine as local anesthetic, under direct ultrasound visualization, a 12 gauge spring-loaded device was used to perform biopsy of the recently demonstrated 1.2 cm mass in the 3:30 o'clock position of the left breast using a caudal approach. At the conclusion of the procedure a ribbon shaped tissue marker clip was deployed into the biopsy cavity. Follow up 2 view mammogram was performed and dictated separately. SITE #2: LEFT BREAST 2 O'CLOCK Lesion quadrant: Upper outer quadrant Using sterile technique and 1% Lidocaine as local anesthetic, under direct ultrasound visualization, a 12 gauge spring-loaded device was used to perform biopsy of the recently demonstrated 1.3 cm mass in the 2 o'clock position of the left breast using a  caudal approach. At the conclusion of the procedure a coil shaped tissue marker clip was deployed into the biopsy cavity. Follow up 2 view mammogram was performed and dictated separately. IMPRESSION: Ultrasound guided biopsy of the recently demonstrated 1 2 cm mass in the 3:30 o'clock position of the left breast and 1.3 cm mass in the 2 o'clock position of the left breast. No apparent complications. Electronically Signed: By: Claudie Revering M.D. 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 This was found to be concordant by Dr. Claudie Revering. Pathology results were discussed with the patient by telephone. The patient reported doing well after the biopsies with tenderness at the sites. Post biopsy instructions and care were reviewed and  questions were answered. The patient was encouraged to call [facility] for any additional concerns. The patient was referred to [facility] on March 03, 2018. Pathology results reported by Terie Purser, RN on 02/23/2018. Electronically Signed   By: Claudie Revering M.D.   On: 02/23/2018 09:50   Result Date: 02/23/2018 CLINICAL DATA:  1.2 cm mass in the 3:30 o'clock position of the left breast and 1.3 cm mass in the 2 o'clock position of the left breast at recent ultrasound, both highly suspicious for malignancy. EXAM: ULTRASOUND GUIDED LEFT BREAST CORE NEEDLE BIOPSY X 2 COMPARISON:  Previous exam(s). FINDINGS: I met with the patient and we discussed the procedure of ultrasound-guided biopsy, including benefits and alternatives. We discussed the high likelihood of a successful procedure. We discussed the risks of the procedure, including infection, bleeding, tissue injury, clip migration, and inadequate sampling. Informed written consent was given. The usual time-out protocol was performed immediately prior to the procedure. SITE #1: LEFT BREAST 3:30 O'CLOCK Lesion quadrant: Lower outer quadrant Using sterile technique and 1% Lidocaine as local anesthetic, under direct ultrasound visualization, a 12 gauge spring-loaded device was used to perform biopsy of the recently demonstrated 1.2 cm mass in the 3:30 o'clock position of the left breast using a caudal approach. At the conclusion of the procedure a ribbon shaped tissue marker clip was deployed into the biopsy cavity. Follow up 2 view mammogram was performed and dictated separately. SITE #2: LEFT BREAST 2 O'CLOCK Lesion quadrant: Upper outer quadrant Using sterile technique and 1% Lidocaine as local anesthetic, under direct ultrasound visualization, a 12 gauge spring-loaded device was used to perform biopsy of the recently demonstrated 1.3 cm mass in the 2  o'clock position of the left breast using a caudal approach. At the conclusion of the procedure a coil shaped tissue marker clip was deployed into the biopsy cavity. Follow up 2 view mammogram was performed and dictated separately. IMPRESSION: Ultrasound guided biopsy of the recently demonstrated 1 2 cm mass in the 3:30 o'clock position of the left breast and 1.3 cm mass in the 2 o'clock position of the left breast. No apparent complications. Electronically Signed: By: Claudie Revering M.D. On: 02/19/2018 16:26      IMPRESSION: Multifocal left breast cancer: Stage IA (cT1c, cN0, cM0), invasive ductal carcinoma of left Breast as well as Stage IA (cT1c, cN0, cM0) left Breast invasive mammary carcinoma  Patient will proceed with an  MRI of the breast to further evaluate for other lesions since one of the lesions is likely lobular breast cancer. She will also proceed with genetics testing given her family history and essentially triple negative breast cancer on one of the biopsies.   Patient will be appear to be a potential candidate for breast conservation with radiotherapy to left breast. The patient does wish to keep her breast if at all possible.   We discussed the general course of radiation, potential side effects, and toxicities with radiation and the patient is interested in this approach.    PLAN:  1. MRI 2. Port 3. Genetics testing 4. Neoadjuvant chemotherapy 5. Surgery to be determined/SLN 6. XRT 7. AI 8. Echo  9. chemo class   ------------------------------------------------  Blair Promise, PhD, MD This document serves as a record of services personally performed by Gery Pray, MD. It was created on his behalf by Mary-Margaret Loma Messing, a trained medical scribe. The creation of this record is based on the scribe's personal observations and the provider's statements to them. This document has been checked and approved by the attending provider.

## 2018-03-04 ENCOUNTER — Encounter (HOSPITAL_BASED_OUTPATIENT_CLINIC_OR_DEPARTMENT_OTHER): Payer: Self-pay | Admitting: *Deleted

## 2018-03-04 ENCOUNTER — Other Ambulatory Visit: Payer: Self-pay

## 2018-03-08 ENCOUNTER — Encounter: Payer: Self-pay | Admitting: Oncology

## 2018-03-08 ENCOUNTER — Inpatient Hospital Stay: Payer: Medicare Other | Admitting: Licensed Clinical Social Worker

## 2018-03-08 ENCOUNTER — Inpatient Hospital Stay: Payer: Medicare Other

## 2018-03-08 ENCOUNTER — Encounter: Payer: Self-pay | Admitting: Licensed Clinical Social Worker

## 2018-03-08 ENCOUNTER — Inpatient Hospital Stay: Payer: Medicare Other | Admitting: *Deleted

## 2018-03-08 DIAGNOSIS — Z170 Estrogen receptor positive status [ER+]: Principal | ICD-10-CM

## 2018-03-08 DIAGNOSIS — Z803 Family history of malignant neoplasm of breast: Secondary | ICD-10-CM | POA: Insufficient documentation

## 2018-03-08 DIAGNOSIS — C50412 Malignant neoplasm of upper-outer quadrant of left female breast: Secondary | ICD-10-CM

## 2018-03-08 DIAGNOSIS — Z171 Estrogen receptor negative status [ER-]: Principal | ICD-10-CM

## 2018-03-08 DIAGNOSIS — C50512 Malignant neoplasm of lower-outer quadrant of left female breast: Secondary | ICD-10-CM

## 2018-03-08 DIAGNOSIS — Z801 Family history of malignant neoplasm of trachea, bronchus and lung: Secondary | ICD-10-CM

## 2018-03-08 DIAGNOSIS — Z800 Family history of malignant neoplasm of digestive organs: Secondary | ICD-10-CM

## 2018-03-08 NOTE — Research (Signed)
UPBEAT   This RN along with Doristine Johns, RN spoke with patient for about 30 minutes about UPBEAT clinical trial. Martin Majestic through informed consent and HIPPA form page by page with patient and answered any questions patient had. Patient stated that she would like more time to think about it. Gave patient contact information, copy of informed consent and HIPAA consent. This RN confirmed with patient that it would be okay for RN to call on Wednesday, January 15th. Patient was thanked for her time and interest.  Johney Maine RN, BSN Clinical Research  03/08/2018 1045

## 2018-03-08 NOTE — Progress Notes (Signed)
Met with patient to introduce myself as Arboriculturist and to offer available resources.  Advised patient of copay assistance available for her diagnosis through PAF and asked if she would like to apply. She states she would. Advised her of the information needed to apply such as gross income and she states she would call to let me know.  Discussed one-time $1000 Radio broadcast assistant to assist with personal expenses while going through treatment. She states she would get me needed information and would like to apply.  Gave my card for any additional financial questions or concerns.

## 2018-03-08 NOTE — Progress Notes (Signed)
REFERRING PROVIDER: Chauncey Cruel, MD [address] [place], [place] [address]  PRIMARY PROVIDER:  Gaynelle Arabian, MD  PRIMARY REASON FOR VISIT:  1. Malignant neoplasm of upper-outer quadrant of left breast in female, estrogen receptor positive ([place])   2. Family history of breast cancer   3. Family history of throat cancer   4. Family history of lung cancer      HISTORY OF PRESENT ILLNESS:   Ms. Amescua, a 68 y.o. female, was seen for a [place] cancer genetics consultation at the request of Dr. Jana Hakim due to her recent diagnosis of breast cancer and history of cancer.  Ms. Lutzke presents to clinic today to discuss the possibility of a hereditary predisposition to cancer, genetic testing, and to further clarify her future cancer risks, as well as potential cancer risks for family members.   In 2019, at the age of 23, Ms. Taha was diagnosed with cancer of the left breast. She had two left breast masses noted on mammography in December and had a biopsy of the area. Pathology showed invasive mammary carcinoma ER+/PR+/Her2- and invasive ductal carcinoma, ER+, PR-, Her2-. The current treatment plan includes neoadjuvant chemotherapy, to be followed by surgery which she is still deciding about, adjuvant radiation as appropriate and antiestrogens.  CANCER HISTORY:    Malignant neoplasm of lower-outer quadrant of left breast of female, estrogen receptor negative ([place])   02/26/2018 Initial Diagnosis    Malignant neoplasm of lower-outer quadrant of left breast of female, estrogen receptor negative ([place])    03/16/2018 -  Chemotherapy    The patient had DOXOrubicin (ADRIAMYCIN) chemo injection 112 mg, 60 mg/m2, Intravenous,  Once, 0 of 4 cycles PALONOSETRON HCL INJECTION 0.25 MG/5ML, 0.25 mg, Intravenous,  Once, 0 of 8 cycles pegfilgrastim-cbqv (UDENYCA) injection 6 mg, 6 mg, Subcutaneous, Once, 0 of 4 cycles CARBOplatin (PARAPLATIN) in sodium chloride 0.9 % 100 mL chemo  infusion, , Intravenous,  Once, 0 of 4 cycles cyclophosphamide (CYTOXAN) 1,120 mg in sodium chloride 0.9 % 250 mL chemo infusion, 600 mg/m2, Intravenous,  Once, 0 of 4 cycles PACLitaxel (TAXOL) 150 mg in sodium chloride 0.9 % 250 mL chemo infusion (</= 41m/m2), 80 mg/m2, Intravenous,  Once, 0 of 4 cycles FOSAPREPITANT 150MG + DEXAMETHASONE INFUSION [facility], , Intravenous,  Once, 0 of 8 cycles  for chemotherapy treatment.      Malignant neoplasm of upper-outer quadrant of left breast in female, estrogen receptor positive ([facility]   02/26/2018 Initial Diagnosis    Malignant neoplasm of upper-outer quadrant of left breast in female, estrogen receptor positive ([place]    03/16/2018 -  Chemotherapy    The patient had DOXOrubicin (ADRIAMYCIN) chemo injection 112 mg, 60 mg/m2, Intravenous,  Once, 0 of 4 cycles PALONOSETRON HCL INJECTION 0.25 MG/5ML, 0.25 mg, Intravenous,  Once, 0 of 8 cycles pegfilgrastim-cbqv (UDENYCA) injection 6 mg, 6 mg, Subcutaneous, Once, 0 of 4 cycles CARBOplatin (PARAPLATIN) in sodium chloride 0.9 % 100 mL chemo infusion, , Intravenous,  Once, 0 of 4 cycles cyclophosphamide (CYTOXAN) 1,120 mg in sodium chloride 0.9 % 250 mL chemo infusion, 600 mg/m2, Intravenous,  Once, 0 of 4 cycles PACLitaxel (TAXOL) 150 mg in sodium chloride 0.9 % 250 mL chemo infusion (</= 868mm2), 80 mg/m2, Intravenous,  Once, 0 of 4 cycles FOSAPREPITANT 150MG + DEXAMETHASONE INFUSION [facility], , Intravenous,  Once, 0 of 8 cycles  for chemotherapy treatment.       HORMONAL RISK FACTORS:  Menarche was at age 68 First live birth at age:  no children.  OCP use for approximately 0 years.  Ovaries intact: yes.  Hysterectomy: yes.  Menopausal status: postmenopausal.  HRT use: 0 years. Colonoscopy: yes; she reports polyps but does not know how many. Mammogram within the last year: yes. Number of breast biopsies: multiple, she does not know exact number, reports breast cysts..  Past Medical History:  Diagnosis  Date  . Family history of breast cancer   . Family history of lung cancer   . Family history of throat cancer   . High cholesterol   . Hypertension   . Kidney stone   . Kidney stones   . UTI (lower urinary tract infection)     Past Surgical History:  Procedure Laterality Date  . BREAST EXCISIONAL BIOPSY Left 1999,2005   cysts removed  . BREAST SURGERY Left    cyst and biopsy    Social History   Socioeconomic History  . Marital status: Single    Spouse name: Not on file  . Number of children: 0  . Years of education: 30  . Highest education level: Not on file  Occupational History  . Occupation: Laborer  Scientific laboratory technician  . Financial resource strain: Not on file  . Food insecurity:    Worry: Not on file    Inability: Not on file  . Transportation needs:    Medical: Not on file    Non-medical: Not on file  Tobacco Use  . Smoking status: Never Smoker  . Smokeless tobacco: Never Used  Substance and Sexual Activity  . Alcohol use: Yes    Comment: seldom  . Drug use: No  . Sexual activity: Not on file  Lifestyle  . Physical activity:    Days per week: Not on file    Minutes per session: Not on file  . Stress: Not on file  Relationships  . Social connections:    Talks on phone: Not on file    Gets together: Not on file    Attends religious service: Not on file    Active member of club or organization: Not on file    Attends meetings of clubs or organizations: Not on file    Relationship status: Not on file  Other Topics Concern  . Not on file  Social History Narrative   Fun: park, walking, music   Denies religious beliefs effecting health care.    Feels safe at home and denies abuse. Lives alone.     FAMILY HISTORY:  We obtained a detailed, 4-generation family history.  Significant diagnoses are listed below: Family History  Problem Relation Age of Onset  . Hypertension Mother   . Heart disease Mother   . Dementia Mother   . Healthy Father   . Colon  cancer Maternal Grandmother   . Colon cancer Paternal Grandmother   . Lung cancer Cousin        81s  . Breast cancer Cousin        2s  . Breast cancer Cousin        45s  . Breast cancer Cousin   . Ovarian cancer Neg Hx     Ms. Bryand does not have children. She has three half siblings through her mother. A maternal half-sister died at 75, no cancers. Another maternal half-sister is living at 26 and her maternal half-brother passed away at 62, no cancers. She also has three half siblings through her dad. Her two half brothers through her dad are living in their 104s, no cancer history  and her half sister is living in her 6s.   Ms. Dy's father is living at 90. He had three brothers and two sisters.  Two of his brothers and both of his sisters did have cancer, but the patient is unsure of what type. One of the patient's aunts died at 63, and it was possibly pancreatic. The other aunt died in her 85's. An uncle with cancer is still living at 11, and her other uncle with cancer died in his 17's. The patient's aunt who possibly had pancreatic cancer had 4 children, and two of her daughters (patient's cousins) had breast cancer: one in her 71's and is still living and the other in her 16's and is deceased. The patient's uncle who had cancer but is still living also had a daughter with breast cancer, she was diagnosed in her 96s or 46s and is still living. This uncle also had a son who passed away from cancer in his 66's but patient doesn't know what type. Ms. Pagan's paternal grandfather died "young," she did not know him, and paternal grandmother died in her 66's and possibly had cancer.   Ms. Dobbins's mother had 11 siblings. One of her sisters died in her late 58's of cancer, but the patient doesn't know what type.This sister had a son who died in his 25's of cancer, but patient doesn't know what type. One of the patient's maternal uncles also had cancer, she believes it was throat cancer, and  he died in his 75's. Another maternal cousin had cancer, she believes it was lung cancer and he died in his 89's. The patient's maternal grandfather died in his 90s and maternal grandmother did have cancer, but patient is unsure of the type, and died in her 51s.   Ms. Philson is unaware of previous family history of genetic testing for hereditary cancer risks. Patient's ancestors are of [place], Caucasian, and American [place] descent.There is no reported Ashkenazi Jewish ancestry. There is no known consanguinity.  GENETIC COUNSELING ASSESSMENT: NYKERIA MEALING is a 68 y.o. female with a personal and family history which is somewhat suggestive of a Hereditary Cancer Predisposition Syndrome. We, therefore, discussed and recommended the following at today's visit.   DISCUSSION: We discussed that about 5-10% of breast cancer cases are hereditary with most cases due to BRCA mutations.  Other genes associated with hereditary breast cancer cases include ATM, CHEK2 and PALB2.  We reviewed the characteristics, features and inheritance patterns of hereditary cancer syndromes. We also discussed genetic testing, including the appropriate family members to test, the process of testing, insurance coverage and turn-around-time for results. We discussed the implications of a negative, positive and/or variant of uncertain significant result. We recommended Ms. Arganbright pursue genetic testing for the [facility] Common Hereditary Cancers Panel.  The Common Hereditary Cancers Panel offered by [facility] includes sequencing and/or deletion duplication testing of the following 47 genes: APC, ATM, AXIN2, BARD1, BMPR1A, BRCA1, BRCA2, BRIP1, CDH1, CDKN2A (p14ARF), CDKN2A (p16INK4a), CKD4, CHEK2, CTNNA1, DICER1, EPCAM (Deletion/duplication testing only), GREM1 (promoter region deletion/duplication testing only), KIT, MEN1, MLH1, MSH2, MSH3, MSH6, MUTYH, NBN, NF1, NHTL1, PALB2, PDGFRA, PMS2, POLD1, POLE, PTEN, RAD50, RAD51C, RAD51D,  SDHB, SDHC, SDHD, SMAD4, SMARCA4. STK11, TP53, TSC1, TSC2, and VHL.  The following genes were evaluated for sequence changes only: SDHA and HOXB13 c.251G>A variant only.  We discussed that if she is found to have a mutation in one of these genes, it may impact surgical decisions, and alter future medical management recommendations such  as increased cancer screenings and consideration of risk reducing surgeries.  A positive result could also have implications for the patient's family members.  A Negative result would mean we were unable to identify a hereditary component to her cancer, but does not rule out the possibility of a hereditary basis for her cancer.  There could be mutations that are undetectable by current technology, or in genes not yet tested or identified to increase cancer risk.    We discussed the potential to find a Variant of Uncertain Significance or VUS.  These are variants that have not yet been identified as pathogenic or benign, and it is unknown if this variant is associated with increased cancer risk or if this is a normal finding.  Most VUS's are reclassified to benign or likely benign.   It should not be used to make medical management decisions. With time, we suspect the lab will determine the significance of any VUS's identified if any.   Based on Ms. Elvin's personal and family history of cancer, she meets NCCN medical criteria for genetic testing. Despite that she meets criteria, she may still have an out of pocket cost.   PLAN: Despite our recommendation, Ms. Hanner did not wish to pursue genetic testing at today's visit. She would like to think about it more. She seemed mostly concerned about the cost. I offered to send her information to [facility] to get an out of pocket cost for her before she agrees to do the testing. I will call her with this number as soon as I hear back. We understand this decision, and remain available to coordinate genetic testing at any time in  the future. We; therefore, recommend Ms. Devoto continue to follow the cancer screening guidelines given by her primary healthcare provider.  Based on Ms. Fuhriman's family history, we recommended her paternal relatives, have genetic counseling and testing. Ms. Fritsche will let us know if we can be of any assistance in coordinating genetic counseling and/or testing for this family member.   Lastly, we encouraged Ms. Buczkowski to remain in contact with cancer genetics annually so that we can continuously update the family history and inform her of any changes in cancer genetics and testing that may be of benefit for this family.   Ms.  Hagg's questions were answered to her satisfaction today. Our contact information was provided should additional questions or concerns arise. Thank you for the referral and allowing [place] to share in the care of your patient.   Faith Rogue, MS Genetic Counselor [place][email or web address] Phone: [phone]   The patient was seen for a total of 35 minutes in face-to-face genetic counseling.

## 2018-03-09 NOTE — Progress Notes (Signed)
Ensure pre surgery drink given with instructions to complete by [facility], surgical soap given with instructions, pt verbalized understanding.

## 2018-03-10 ENCOUNTER — Ambulatory Visit (HOSPITAL_BASED_OUTPATIENT_CLINIC_OR_DEPARTMENT_OTHER): Payer: Medicare Other | Admitting: Anesthesiology

## 2018-03-10 ENCOUNTER — Encounter (HOSPITAL_BASED_OUTPATIENT_CLINIC_OR_DEPARTMENT_OTHER)
Admission: RE | Disposition: A | Discharge status: Home / Self Care | Payer: Self-pay | Source: Home / Self Care | Attending: General Surgery

## 2018-03-10 ENCOUNTER — Ambulatory Visit (HOSPITAL_BASED_OUTPATIENT_CLINIC_OR_DEPARTMENT_OTHER)
Admission: RE | Admit: 2018-03-10 | Discharge: 2018-03-10 | Disposition: A | Discharge status: Home / Self Care | Payer: Medicare Other | Attending: General Surgery | Admitting: General Surgery

## 2018-03-10 ENCOUNTER — Encounter (HOSPITAL_BASED_OUTPATIENT_CLINIC_OR_DEPARTMENT_OTHER): Payer: Self-pay | Admitting: Anesthesiology

## 2018-03-10 ENCOUNTER — Telehealth: Payer: Self-pay

## 2018-03-10 ENCOUNTER — Ambulatory Visit (HOSPITAL_COMMUNITY): Payer: Medicare Other

## 2018-03-10 DIAGNOSIS — Z171 Estrogen receptor negative status [ER-]: Secondary | ICD-10-CM | POA: Diagnosis not present

## 2018-03-10 DIAGNOSIS — Z803 Family history of malignant neoplasm of breast: Secondary | ICD-10-CM | POA: Diagnosis not present

## 2018-03-10 DIAGNOSIS — C50812 Malignant neoplasm of overlapping sites of left female breast: Secondary | ICD-10-CM | POA: Diagnosis not present

## 2018-03-10 DIAGNOSIS — I10 Essential (primary) hypertension: Secondary | ICD-10-CM | POA: Insufficient documentation

## 2018-03-10 DIAGNOSIS — Z95828 Presence of other vascular implants and grafts: Secondary | ICD-10-CM

## 2018-03-10 DIAGNOSIS — Z170 Estrogen receptor positive status [ER+]: Secondary | ICD-10-CM | POA: Insufficient documentation

## 2018-03-10 DIAGNOSIS — Z79899 Other long term (current) drug therapy: Secondary | ICD-10-CM | POA: Insufficient documentation

## 2018-03-10 DIAGNOSIS — Z7982 Long term (current) use of aspirin: Secondary | ICD-10-CM | POA: Insufficient documentation

## 2018-03-10 DIAGNOSIS — Z419 Encounter for procedure for purposes other than remedying health state, unspecified: Secondary | ICD-10-CM

## 2018-03-10 HISTORY — PX: PORTACATH PLACEMENT: SHX2246

## 2018-03-10 SURGERY — INSERTION, TUNNELED CENTRAL VENOUS DEVICE, WITH PORT
Anesthesia: General | Site: Chest | Laterality: Left

## 2018-03-10 MED ORDER — LACTATED RINGERS IV SOLN
INTRAVENOUS | Status: DC
  Administered 2018-03-10: 13:00:00 via INTRAVENOUS

## 2018-03-10 MED ORDER — LIDOCAINE-PRILOCAINE 2.5-2.5 % EX CREA: 1.0000 "application " | TOPICAL_CREAM | CUTANEOUS | 0 refills | Status: DC | PRN

## 2018-03-10 MED ORDER — GABAPENTIN 300 MG PO CAPS
ORAL_CAPSULE | ORAL | Status: AC
  Filled 2018-03-10: qty 1

## 2018-03-10 MED ORDER — GABAPENTIN 300 MG PO CAPS
300.0000 mg | ORAL_CAPSULE | ORAL | Status: AC
  Administered 2018-03-10: 300 mg via ORAL

## 2018-03-10 MED ORDER — BUPIVACAINE-EPINEPHRINE 0.25% -1:200000 IJ SOLN
INTRAMUSCULAR | Status: DC | PRN
  Administered 2018-03-10: 10 mL

## 2018-03-10 MED ORDER — LIDOCAINE 2% (20 MG/ML) 5 ML SYRINGE
INTRAMUSCULAR | Status: AC
  Filled 2018-03-10: qty 5

## 2018-03-10 MED ORDER — BUPIVACAINE-EPINEPHRINE (PF) 0.25% -1:200000 IJ SOLN
INTRAMUSCULAR | Status: AC
  Filled 2018-03-10: qty 30

## 2018-03-10 MED ORDER — HEPARIN (PORCINE) IN NACL 1000-0.9 UT/500ML-% IV SOLN
INTRAVENOUS | Status: AC
  Filled 2018-03-10: qty 500

## 2018-03-10 MED ORDER — CEFAZOLIN SODIUM-DEXTROSE 2-4 GM/100ML-% IV SOLN
2.0000 g | INTRAVENOUS | Status: AC
  Administered 2018-03-10: 2 g via INTRAVENOUS

## 2018-03-10 MED ORDER — SCOPOLAMINE 1 MG/3DAYS TD PT72: 1.0000 | MEDICATED_PATCH | Freq: Once | TRANSDERMAL | Status: DC | PRN

## 2018-03-10 MED ORDER — FENTANYL CITRATE (PF) 100 MCG/2ML IJ SOLN
50.0000 ug | INTRAMUSCULAR | Status: DC | PRN
  Administered 2018-03-10: 50 ug via INTRAVENOUS

## 2018-03-10 MED ORDER — OXYCODONE HCL 5 MG/5ML PO SOLN: 5.0000 mg | Freq: Once | ORAL | Status: DC | PRN

## 2018-03-10 MED ORDER — MIDAZOLAM HCL 2 MG/2ML IJ SOLN
1.0000 mg | INTRAMUSCULAR | Status: DC | PRN
  Administered 2018-03-10: 2 mg via INTRAVENOUS

## 2018-03-10 MED ORDER — CHLORHEXIDINE GLUCONATE CLOTH 2 % EX PADS: 6.0000 | MEDICATED_PAD | Freq: Once | CUTANEOUS | Status: DC

## 2018-03-10 MED ORDER — LIDOCAINE 2% (20 MG/ML) 5 ML SYRINGE
INTRAMUSCULAR | Status: DC | PRN
  Administered 2018-03-10: 50 mg via INTRAVENOUS

## 2018-03-10 MED ORDER — LIDOCAINE-EPINEPHRINE (PF) 1 %-1:200000 IJ SOLN
INTRAMUSCULAR | Status: AC
  Filled 2018-03-10: qty 30

## 2018-03-10 MED ORDER — OXYCODONE HCL 5 MG PO TABS: 5.0000 mg | ORAL_TABLET | Freq: Once | ORAL | Status: DC | PRN

## 2018-03-10 MED ORDER — MIDAZOLAM HCL 2 MG/2ML IJ SOLN
INTRAMUSCULAR | Status: AC
  Filled 2018-03-10: qty 2

## 2018-03-10 MED ORDER — ACETAMINOPHEN 500 MG PO TABS
ORAL_TABLET | ORAL | Status: AC
  Filled 2018-03-10: qty 2

## 2018-03-10 MED ORDER — CEFAZOLIN SODIUM-DEXTROSE 2-4 GM/100ML-% IV SOLN
INTRAVENOUS | Status: AC
  Filled 2018-03-10: qty 100

## 2018-03-10 MED ORDER — ACETAMINOPHEN 500 MG PO TABS
1000.0000 mg | ORAL_TABLET | ORAL | Status: AC
  Administered 2018-03-10: 1000 mg via ORAL

## 2018-03-10 MED ORDER — PROPOFOL 10 MG/ML IV BOLUS
INTRAVENOUS | Status: DC | PRN
  Administered 2018-03-10: 150 mg via INTRAVENOUS

## 2018-03-10 MED ORDER — FENTANYL CITRATE (PF) 100 MCG/2ML IJ SOLN: 25.0000 ug | INTRAMUSCULAR | Status: DC | PRN

## 2018-03-10 MED ORDER — DEXAMETHASONE SODIUM PHOSPHATE 4 MG/ML IJ SOLN
INTRAMUSCULAR | Status: DC | PRN
  Administered 2018-03-10: 4 mg via INTRAVENOUS

## 2018-03-10 MED ORDER — HEPARIN SOD (PORK) LOCK FLUSH 100 UNIT/ML IV SOLN
INTRAVENOUS | Status: AC
  Filled 2018-03-10: qty 5

## 2018-03-10 MED ORDER — BUPIVACAINE-EPINEPHRINE (PF) 0.5% -1:200000 IJ SOLN
INTRAMUSCULAR | Status: AC
  Filled 2018-03-10: qty 1.8

## 2018-03-10 MED ORDER — FENTANYL CITRATE (PF) 100 MCG/2ML IJ SOLN
INTRAMUSCULAR | Status: AC
  Filled 2018-03-10: qty 2

## 2018-03-10 MED ORDER — OXYCODONE HCL 5 MG PO TABS: 5.0000 mg | ORAL_TABLET | Freq: Four times a day (QID) | ORAL | 0 refills | Status: DC | PRN

## 2018-03-10 MED ORDER — MEPERIDINE HCL 25 MG/ML IJ SOLN: 6.2500 mg | INTRAMUSCULAR | Status: DC | PRN

## 2018-03-10 SURGICAL SUPPLY — 45 items
ADH SKN CLS APL DERMABOND .7 (GAUZE/BANDAGES/DRESSINGS) ×1
BAG DECANTER FOR FLEXI CONT (MISCELLANEOUS) ×3 IMPLANT
BLADE HEX COATED 2.75 (ELECTRODE) ×3 IMPLANT
BLADE SURG 11 STRL SS (BLADE) ×3 IMPLANT
BLADE SURG 15 STRL LF DISP TIS (BLADE) ×1 IMPLANT
BLADE SURG 15 STRL SS (BLADE) ×3
CHLORAPREP W/TINT 26ML (MISCELLANEOUS) ×3 IMPLANT
COVER BACK TABLE 60X90IN (DRAPES) ×3 IMPLANT
COVER MAYO STAND STRL (DRAPES) ×3 IMPLANT
COVER WAND RF STERILE (DRAPES) IMPLANT
DECANTER SPIKE VIAL GLASS SM (MISCELLANEOUS) IMPLANT
DERMABOND ADVANCED (GAUZE/BANDAGES/DRESSINGS) ×2
DERMABOND ADVANCED .7 DNX12 (GAUZE/BANDAGES/DRESSINGS) ×1 IMPLANT
DRAPE C-ARM 42X72 X-RAY (DRAPES) ×3 IMPLANT
DRAPE LAPAROTOMY TRNSV 102X78 (DRAPE) ×3 IMPLANT
DRAPE UTILITY XL STRL (DRAPES) ×3 IMPLANT
DRSG TEGADERM 4X4.75 (GAUZE/BANDAGES/DRESSINGS) IMPLANT
ELECT REM PT RETURN 9FT ADLT (ELECTROSURGICAL) ×3
ELECTRODE REM PT RTRN 9FT ADLT (ELECTROSURGICAL) ×1 IMPLANT
GAUZE SPONGE 4X4 12PLY STRL LF (GAUZE/BANDAGES/DRESSINGS) IMPLANT
GLOVE BIO SURGEON STRL SZ 6 (GLOVE) ×3 IMPLANT
GLOVE BIO SURGEON STRL SZ 6.5 (GLOVE) ×1 IMPLANT
GLOVE BIO SURGEON STRL SZ7 (GLOVE) ×2 IMPLANT
GLOVE BIO SURGEONS STRL SZ 6.5 (GLOVE) ×1
GLOVE BIOGEL PI IND STRL 6.5 (GLOVE) ×1 IMPLANT
GLOVE BIOGEL PI INDICATOR 6.5 (GLOVE) ×2
GOWN STRL REUS W/ TWL LRG LVL3 (GOWN DISPOSABLE) ×1 IMPLANT
GOWN STRL REUS W/TWL 2XL LVL3 (GOWN DISPOSABLE) ×3 IMPLANT
GOWN STRL REUS W/TWL LRG LVL3 (GOWN DISPOSABLE) ×3
IV CONNECTOR ONE LINK NDLESS (IV SETS) IMPLANT
KIT PORT POWER 8FR ISP CVUE (Port) ×2 IMPLANT
NDL HYPO 25X1 1.5 SAFETY (NEEDLE) ×1 IMPLANT
NEEDLE HYPO 25X1 1.5 SAFETY (NEEDLE) ×3 IMPLANT
PACK BASIN DAY SURGERY FS (CUSTOM PROCEDURE TRAY) ×3 IMPLANT
PENCIL BUTTON HOLSTER BLD 10FT (ELECTRODE) ×3 IMPLANT
SLEEVE SCD COMPRESS KNEE MED (MISCELLANEOUS) ×3 IMPLANT
SUT MNCRL AB 4-0 PS2 18 (SUTURE) ×3 IMPLANT
SUT PROLENE 2 0 SH DA (SUTURE) ×6 IMPLANT
SUT VIC AB 3-0 SH 27 (SUTURE) ×3
SUT VIC AB 3-0 SH 27X BRD (SUTURE) ×1 IMPLANT
SUT VICRYL 3-0 CR8 SH (SUTURE) IMPLANT
SYR 10ML LL (SYRINGE) ×3 IMPLANT
SYR 5ML LUER SLIP (SYRINGE) ×3 IMPLANT
SYR CONTROL 10ML LL (SYRINGE) ×3 IMPLANT
TOWEL GREEN STERILE FF (TOWEL DISPOSABLE) ×3 IMPLANT

## 2018-03-10 NOTE — Interval H&P Note (Signed)
History and Physical Interval Note:  03/10/2018 2:31 PM  Emily Chambers  has presented today for surgery, with the diagnosis of left breast cancer  The various methods of treatment have been discussed with the patient and family. After consideration of risks, benefits and other options for treatment, the patient has consented to  Procedure(s): INSERTION PORT-A-CATH (Left) as a surgical intervention .  The patient's history has been reviewed, patient examined, no change in status, stable for surgery.  I have reviewed the patient's chart and labs.  Questions were answered to the patient's satisfaction.     Stark Klein

## 2018-03-10 NOTE — Op Note (Signed)
PREOPERATIVE DIAGNOSIS:  Left breast cancer     POSTOPERATIVE DIAGNOSIS:  Same     PROCEDURE: Left subclavian port placement, Bard ClearVue  Power Port, MRI safe, 8-French.      SURGEON:  Stark Klein, MD      ANESTHESIA:  General   FINDINGS:  Good venous return, easy flush, and tip of the catheter and   SVC 25.5 cm.      SPECIMEN:  None.      ESTIMATED BLOOD LOSS:  Minimal.      COMPLICATIONS:  None known.      PROCEDURE:  Pt was identified in the holding area and taken to   the operating room, where patient was placed supine on the operating room   table.  General anesthesia was induced.  Patient's arms were tucked and the upper   chest and neck were prepped and draped in sterile fashion.  Time-out was   performed according to the surgical safety check list.  When all was   correct, we continued.   Local anesthetic was administered over this   area at the angle of the clavicle.  The vein was accessed with 1 pass(es) of the needle. There was good venous return and the wire passed easily with no ectopy.   Fluoroscopy was used to confirm that the wire was in the vena cava.      The patient was placed back level and the area for the pocket was anethetized   with local anesthetic.  A 3-cm transverse incision was made with a #15   blade.  Cautery was used to divide the subcutaneous tissues down to the   pectoralis muscle.  An [employer] retractor was used to elevate the skin   while a pocket was created on top of the pectoralis fascia.  The port   was placed into the pocket to confirm that it was of adequate size.  The   catheter was preattached to the port.  The port was then secured to the   pectoralis fascia with four 2-0 Prolene sutures.  These were clamped and   not tied down yet.    The catheter was tunneled through to the wire exit   site.  The catheter was placed along the wire to determine what length it should be to be in the SVC.  The catheter was cut at 25.5 cm.  The  tunneler sheath and dilator were passed over the wire and the dilator and wire were removed.  The catheter was advanced through the tunneler sheath and the tunneler sheath was pulled away.  Care was taken to keep the catheter in the tunneler sheath as this occurred. This was advanced and the tunneler sheath was removed.  There was good venous   return and easy flush of the catheter.  The Prolene sutures were tied   down to the pectoral fascia.  The skin was reapproximated using 3-0   Vicryl interrupted deep dermal sutures.    Fluoroscopy was used to re-confirm good position of the catheter.  The skin   was then closed using 4-0 Monocryl in a subcuticular fashion.  The port was flushed with concentrated heparin flush as well.  The wounds were then cleaned, dried, and dressed with Dermabond.  The patient was awakened from anesthesia and taken to the PACU in stable condition.  Needle, sponge, and instrument counts were correct.               Stark Klein, MD

## 2018-03-10 NOTE — Telephone Encounter (Signed)
Left VM. Gave patient office number and told her to call with any questions. Thanked her for her time and interest.

## 2018-03-10 NOTE — Anesthesia Procedure Notes (Signed)
Procedure Name: LMA Insertion Date/Time: 03/10/2018 2:51 PM Performed by: Eulas Post, Montez Stryker W, CRNA Pre-anesthesia Checklist: Patient identified, Emergency Drugs available, Suction available and Patient being monitored Patient Re-evaluated:Patient Re-evaluated prior to induction Oxygen Delivery Method: Circle system utilized Preoxygenation: Pre-oxygenation with 100% oxygen Induction Type: IV induction Ventilation: Mask ventilation without difficulty LMA: LMA inserted LMA Size: 4.0 Number of attempts: 1 Airway Equipment and Method: Bite block Placement Confirmation: positive ETCO2 and breath sounds checked- equal and bilateral Tube secured with: Tape Dental Injury: Teeth and Oropharynx as per pre-operative assessment

## 2018-03-10 NOTE — H&P (Signed)
Emily Chambers Appointment: 03/03/2018 1:00 PM Location: [facility] Patient #: [id number] DOB: 1950/09/24 Undefined / Language: Cleophus Molt / Race: Black or African American Female   History of Present Illness Stark Klein MD; 03/03/2018 2:31 PM) The patient is a 68 year old female who presents with breast cancer. Patient is a 68 year old female who is referred for screening detected left breast cancer. She was found to have a 1.3 cm mass at 2:00 in the left breast and a 1.2 cm mass at 330 on the left breast. Diagnostic imaging was performed which confirmed these areas of suspicion. They were biopsied and the one at 2:00 was weakly ER positive, PR negative, and HER-2 negative. Ki-67 was 40%. The one at 330 o'clock was hormone receptor positive and HER-2 negative.  The patient has breast density category C. 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 She is relatively healthy. She has had a benign breast biopsy on the left by Dr. Michiel Sites prior to his retirement. She has a several cousins with cancer of the breast on her mother side. She is not smoke cigarettes or use drugs. She drinks alcohol occasionally. She had menarche at age 26. She has not had periods since the age of 25. She is a G0. She has had a colonoscopy and a Pap smear.  dx mammogram/us 02/16/2018 ACR Breast Density Category c: The breast tissue is heterogeneously dense, which may obscure small masses.  FINDINGS: The possible mass in the upper outer left breast persists on the diagnostic spot-compression images. Mass is lobulated and partly circumscribed. There several smaller nodular opacities in the left breast that similar to prior mammograms. There is postsurgical architectural distortion in the upper outer quadrant. No suspicious calcifications.  Mammographic images were processed with CAD.  On physical exam, a small firm mass is palpated the left breast near 2 o'clock. There is a  generalized nodular texture to tissue throughout the upper outer and lateral aspects of left breast.  Targeted ultrasound is performed, showing a hypoechoic mass with partly lobulated partly ill-defined margins in the left breast at 2 o'clock, 5 cm from the nipple, measuring 13 x 10 x 10 mm. There is internal blood flow on color Doppler analysis. In the 3:30 o'clock position, 6 cm the nipple, there is a less well-defined hypoechoic mass that measures 12 x 7 x 12 mm.  Sonographic evaluation of the left axilla shows no enlarged or abnormal lymph nodes.  IMPRESSION: 1. Two left breast masses, 1 at 2 o'clock and the other at 3:30 o'clock. The 2 o'clock mass corresponds to the mammographic abnormality and is highly suspicious for breast carcinoma. The other mass is suspicious for breast carcinoma. No left axillary adenopathy.  RECOMMENDATION: 1. Ultrasound-guided core needle biopsy both the 2 o'clock and 3:30 o'clock position left breast masses.  I have discussed the findings and recommendations with the patient. Results were also provided in writing at the conclusion of the visit. If applicable, a reminder letter will be sent to the patient regarding the next appointment.  BI-RADS CATEGORY 5: Highly suggestive of malignancy.   pathology 02/19/2018 Diagnosis 1. Breast, left, needle core biopsy, 3:30 o'clock - INVASIVE MAMMARY CARCINOMA, GRADE 2-3. SEE NOTE. 1. Immunostain for E-cadherin shows reduced but preserved membranous expression, suggestive of a ductal phenotype The tumor cells are NEGATIVE for Her2 (1+). Estrogen Receptor: 90%, POSITIVE, STRONG STAINING INTENSITY Progesterone Receptor: 95%, POSITIVE, STRONG STAINING INTENSITY Proliferation Marker Ki67: 5%  2. Breast, left, needle core biopsy, 2  o'clock - INVASIVE DUCTAL CARCINOMA, GRADE 2. SEE NOTE. The tumor cells are NEGATIVE for Her2 (1+). Estrogen Receptor: 10%, POSITIVE, MODERATE STAINING INTENSITY Progesterone  Receptor: 0%, NEGATIVE Proliferation Marker Ki67: 40%  Labs CMET and CBC 03/03/2018 are essentially normal.     Review of Systems Stark Klein MD; 03/03/2018 2:27 PM) All other systems negative  Vitals Stark Klein MD; 03/03/2018 2:30 PM) 03/03/2018 2:30 PM Weight: 175.5 lb Height: 62in Body Surface Area: 1.81 m Body Mass Index: 32.1 kg/m  Temp.: 98.71F  Resp.: 18 (Unlabored)  BP: 139/74 (Sitting, Left Arm, Standard)       Physical Exam Stark Klein MD; 03/03/2018 2:29 PM) General Mental Status-Alert. General Appearance-Consistent with stated age. Hydration-Well hydrated. Voice-Normal.  Head and Neck Head-normocephalic, atraumatic with no lesions or palpable masses. Trachea-midline. Thyroid Gland Characteristics - normal size and consistency.  Eye Eyeball - Bilateral-Extraocular movements intact. Sclera/Conjunctiva - Bilateral-No scleral icterus.  Chest and Lung Exam Chest and lung exam reveals -quiet, even and easy respiratory effort with no use of accessory muscles and on auscultation, normal breath sounds, no adventitious sounds and normal vocal resonance. Inspection Chest Wall - Normal. Back - normal.  Breast Note: no palpable masses. No LAD. No skin dimpling. no nipple retraction or nipple discharge. breasts are reasonably symmetric bilaterally.   Cardiovascular Cardiovascular examination reveals -normal heart sounds, regular rate and rhythm with no murmurs and normal pedal pulses bilaterally.  Abdomen Inspection Inspection of the abdomen reveals - No Hernias. Palpation/Percussion Palpation and Percussion of the abdomen reveal - Soft, Non Tender, No Rebound tenderness, No Rigidity (guarding) and No hepatosplenomegaly. Auscultation Auscultation of the abdomen reveals - Bowel sounds normal.  Neurologic Neurologic evaluation reveals -alert and oriented x 3 with no impairment of recent or remote memory. Mental  Status-Normal.  Musculoskeletal Global Assessment -Note: no gross deformities.  Normal Exam - Left-Upper Extremity Strength Normal and Lower Extremity Strength Normal. Normal Exam - Right-Upper Extremity Strength Normal and Lower Extremity Strength Normal.  Lymphatic Head & Neck  General Head & Neck Lymphatics: Bilateral - Description - Normal. Axillary  General Axillary Region: Bilateral - Description - Normal. Tenderness - Non Tender. Femoral & Inguinal  Generalized Femoral & Inguinal Lymphatics: Bilateral - Description - No Generalized lymphadenopathy.    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 One of these is nearly triple negative with very weak estrogen receptor staining. Because of this, we will plan to do neoadjuvant chemotherapy. Chemo would be recommended either way. The patient does desire to save her breast. This will maximize her ability to do so. I will schedule her for port placement. I reviewed risks of port placement.  For reasons of breast density as well as the multifocal nature of the cancer, we will get a MRI of the breast. This will also assist with surgical planning. She will be set up for chemotherapy class. She is also going to be scheduled for an echocardiogram. Current Plans Referred to Genetic Counseling, for evaluation and follow up [employer]). Routine. Pt Education - ccs port insertion education MR BREAST BILAT W CON (02542) (two breast cancers in left breast, dense breast tissue, one triple negative.) FAMILY HISTORY OF BREAST CANCER (Z80.3) Impression: Genetics referral. I discussed what findings may have an impact on our surgery.    Signed by Stark Klein, MD (03/03/2018 2:32 PM)

## 2018-03-10 NOTE — Anesthesia Preprocedure Evaluation (Signed)
Anesthesia Evaluation  Patient identified by MRN, date of birth, ID band Patient awake    Reviewed: Allergy & Precautions, NPO status , Patient's Chart, lab work & pertinent test results  Airway Mallampati: II  TM Distance: >3 FB Neck ROM: Full    Dental  (+) Dental Advisory Given   Pulmonary neg pulmonary ROS,    Pulmonary exam normal breath sounds clear to auscultation       Cardiovascular hypertension, negative cardio ROS Normal cardiovascular exam Rhythm:Regular Rate:Normal     Neuro/Psych negative neurological ROS  negative psych ROS   GI/Hepatic negative GI ROS, Neg liver ROS,   Endo/Other  negative endocrine ROS  Renal/GU Renal disease     Musculoskeletal negative musculoskeletal ROS (+)   Abdominal (+) + obese,   Peds  Hematology negative hematology ROS (+)   Anesthesia Other Findings   Reproductive/Obstetrics negative OB ROS                             Anesthesia Physical Anesthesia Plan  ASA: II  Anesthesia Plan: General   Post-op Pain Management:    Induction: Intravenous  PONV Risk Score and Plan: 4 or greater and Ondansetron, Dexamethasone and Treatment may vary due to age or medical condition  Airway Management Planned: LMA  Additional Equipment: None  Intra-op Plan:   Post-operative Plan: Extubation in OR  Informed Consent: I have reviewed the patients History and Physical, chart, labs and discussed the procedure including the risks, benefits and alternatives for the proposed anesthesia with the patient or authorized representative who has indicated his/her understanding and acceptance.     Dental advisory given  Plan Discussed with: CRNA  Anesthesia Plan Comments:         Anesthesia Quick Evaluation

## 2018-03-10 NOTE — Transfer of Care (Signed)
Immediate Anesthesia Transfer of Care Note  Patient: Emily Chambers  Procedure(s) Performed: INSERTION PORT-A-CATH (Left Chest)  Patient Location: PACU  Anesthesia Type:General  Level of Consciousness: sedated and responds to stimulation  Airway & Oxygen Therapy: Patient Spontanous Breathing and Patient connected to face mask oxygen  Post-op Assessment: Report given to RN and Post -op Vital signs reviewed and stable  Post vital signs: Reviewed and stable  Last Vitals:  Vitals Value Taken Time  BP 122/83 03/10/2018  3:36 PM  Temp    Pulse 73 03/10/2018  3:36 PM  Resp    SpO2 100 % 03/10/2018  3:36 PM  Vitals shown include unvalidated device data.  Last Pain:  Vitals:   03/10/18 1238  TempSrc: Oral  PainSc: 0-No pain         Complications: No apparent anesthesia complications

## 2018-03-10 NOTE — Discharge Instructions (Addendum)
°Post Anesthesia Home Care Instructions ° °Activity: °Get plenty of rest for the remainder of the day. A responsible individual must stay with you for 24 hours following the procedure.  °For the next 24 hours, DO NOT: °-Drive a car °-Operate machinery °-Drink alcoholic beverages °-Take any medication unless instructed by your physician °-Make any legal decisions or sign important papers. ° °Meals: °Start with liquid foods such as gelatin or soup. Progress to regular foods as tolerated. Avoid greasy, spicy, heavy foods. If nausea and/or vomiting occur, drink only clear liquids until the nausea and/or vomiting subsides. Call your physician if vomiting continues. ° °Special Instructions/Symptoms: °Your throat may feel dry or sore from the anesthesia or the breathing tube placed in your throat during surgery. If this causes discomfort, gargle with warm salt water. The discomfort should disappear within 24 hours. ° °If you had a scopolamine patch placed behind your ear for the management of post- operative nausea and/or vomiting: ° °1. The medication in the patch is effective for 72 hours, after which it should be removed.  Wrap patch in a tissue and discard in the trash. Wash hands thoroughly with soap and water. °2. You may remove the patch earlier than 72 hours if you experience unpleasant side effects which may include dry mouth, dizziness or visual disturbances. °3. Avoid touching the patch. Wash your hands with soap and water after contact with the patch. °  °[facility] °Office Phone Number [phone] ° ° POST OP INSTRUCTIONS ° °Always review your discharge instruction sheet given to you by the facility where your surgery was performed. ° °IF YOU HAVE DISABILITY OR FAMILY LEAVE FORMS, YOU MUST BRING THEM TO THE OFFICE FOR PROCESSING.  DO NOT GIVE THEM TO YOUR DOCTOR. ° °1. A prescription for pain medication may be given to you upon discharge.  Take your pain medication as prescribed, if needed.   If narcotic pain medicine is not needed, then you may take acetaminophen (Tylenol) or ibuprofen (Advil) as needed. °2. Take your usually prescribed medications unless otherwise directed °3. If you need a refill on your pain medication, please contact your pharmacy.  They will contact our office to request authorization.  Prescriptions will not be filled after 5pm or on week-ends. °4. You should eat very light the first 24 hours after surgery, such as soup, crackers, pudding, etc.  Resume your normal diet the day after surgery °5. It is common to experience some constipation if taking pain medication after surgery.  Increasing fluid intake and taking a stool softener will usually help or prevent this problem from occurring.  A mild laxative (Milk of Magnesia or Miralax) should be taken according to package directions if there are no bowel movements after 48 hours. °6. You may shower in 48 hours.  The surgical glue will flake off in 2-3 weeks.   °7. ACTIVITIES:  No strenuous activity or heavy lifting for 1 week.   °a. You may drive when you no longer are taking prescription pain medication, you can comfortably wear a seatbelt, and you can safely maneuver your car and apply brakes. °b. RETURN TO WORK:  __________to be determined._______________ °You should see your doctor in the office for a follow-up appointment approximately three-four weeks after your surgery.   ° °WHEN TO CALL YOUR DOCTOR: °1. Fever over 101.0 °2. Nausea and/or vomiting. °3. Extreme swelling or bruising. °4. Continued bleeding from incision. °5. Increased pain, redness, or drainage from the incision. ° °The clinic staff is available to   answer your questions during regular business hours.  Please don’t hesitate to call and ask to speak to one of the nurses for clinical concerns.  If you have a medical emergency, go to the nearest emergency room or call 911.  A surgeon from [facility] is always on call at the hospital. ° °For further  questions, please visit [email or web address]  ° °

## 2018-03-11 ENCOUNTER — Telehealth: Payer: Self-pay

## 2018-03-11 ENCOUNTER — Encounter (HOSPITAL_BASED_OUTPATIENT_CLINIC_OR_DEPARTMENT_OTHER): Payer: Self-pay | Admitting: General Surgery

## 2018-03-11 ENCOUNTER — Ambulatory Visit (HOSPITAL_BASED_OUTPATIENT_CLINIC_OR_DEPARTMENT_OTHER)
Admission: RE | Admit: 2018-03-11 | Discharge: 2018-03-11 | Disposition: A | Discharge status: Home / Self Care | Payer: Medicare Other | Source: Ambulatory Visit | Attending: Oncology | Admitting: Oncology

## 2018-03-11 ENCOUNTER — Ambulatory Visit (HOSPITAL_COMMUNITY)
Admission: RE | Admit: 2018-03-11 | Discharge: 2018-03-11 | Disposition: A | Discharge status: Home / Self Care | Payer: Medicare Other | Source: Ambulatory Visit | Attending: Oncology | Admitting: Oncology

## 2018-03-11 ENCOUNTER — Telehealth: Payer: Self-pay | Admitting: *Deleted

## 2018-03-11 DIAGNOSIS — Z170 Estrogen receptor positive status [ER+]: Secondary | ICD-10-CM | POA: Diagnosis present

## 2018-03-11 DIAGNOSIS — C50512 Malignant neoplasm of lower-outer quadrant of left female breast: Secondary | ICD-10-CM | POA: Insufficient documentation

## 2018-03-11 MED ORDER — GADOBUTROL 1 MMOL/ML IV SOLN
10.0000 mL | Freq: Once | INTRAVENOUS | Status: AC | PRN
  Administered 2018-03-11: 8 mL via INTRAVENOUS

## 2018-03-11 NOTE — Progress Notes (Signed)
  Echocardiogram 2D Echocardiogram has been performed.  Emily Chambers 03/11/2018, 10:40 AM

## 2018-03-11 NOTE — Telephone Encounter (Signed)
Called pt regarding Carter from 1.8.20. Unable to leave vm d/t it is full.

## 2018-03-11 NOTE — Telephone Encounter (Signed)
VM box full. Unable to leave message.

## 2018-03-11 NOTE — Anesthesia Postprocedure Evaluation (Signed)
Anesthesia Post Note  Patient: Emily Chambers  Procedure(s) Performed: INSERTION PORT-A-CATH (Left Chest)     Patient location during evaluation: PACU Anesthesia Type: General Level of consciousness: sedated and patient cooperative Pain management: pain level controlled Vital Signs Assessment: post-procedure vital signs reviewed and stable Respiratory status: spontaneous breathing Cardiovascular status: stable Anesthetic complications: no    Last Vitals:  Vitals:   03/10/18 1630 03/10/18 1645  BP: 137/79 139/85  Pulse: 67 71  Resp: (!) 9 18  Temp:  36.4 C  SpO2: 96% 97%    Last Pain:  Vitals:   03/10/18 1645  TempSrc:   PainSc: 0-No pain   Pain Goal:                @[email or web address] (12500)  )Nolon Nations

## 2018-03-12 ENCOUNTER — Other Ambulatory Visit: Payer: Self-pay | Admitting: General Surgery

## 2018-03-12 ENCOUNTER — Telehealth: Payer: Self-pay | Admitting: Licensed Clinical Social Worker

## 2018-03-12 DIAGNOSIS — C50812 Malignant neoplasm of overlapping sites of left female breast: Secondary | ICD-10-CM

## 2018-03-12 NOTE — Addendum Note (Signed)
Addended by: Faith Rogue T on: 03/12/2018 10:20 AM   Modules accepted: Orders

## 2018-03-12 NOTE — Progress Notes (Signed)
On 03/12/2018, the patient was called and informed that [facility] has estimated her out of pocket cost for testing to be $5.00. The patient has agreed to do testing and we have arranged for her blood to be drawn at her next lab appointment on 03/17/2018.   UPDATED PLAN: After considering the risks, benefits, and limitations, Ms. Arman  provided informed consent to pursue genetic testing and the blood sample was sent to [facility] for analysis of the Common Hereditary Cancers Panel. Results should be available within approximately 2-3 weeks' time, at which point they will be disclosed by telephone to Ms. Kicklighter, as will any additional recommendations warranted by these results. Ms. Caniglia will receive a summary of her genetic counseling visit and a copy of her results once available. This information will also be available in Epic.

## 2018-03-12 NOTE — Telephone Encounter (Signed)
Informed patient that Emily Chambers has estimated her out of pocket cost to be $5.00 for the genetic testing. She agreed to do the testing and I have arranged for her to have blood drawn for this at her next appointment which is 1/22.

## 2018-03-15 ENCOUNTER — Encounter: Payer: Self-pay | Admitting: Hematology and Oncology

## 2018-03-15 ENCOUNTER — Telehealth: Payer: Self-pay | Admitting: Oncology

## 2018-03-15 ENCOUNTER — Encounter: Payer: Self-pay | Admitting: Adult Health

## 2018-03-15 ENCOUNTER — Inpatient Hospital Stay (HOSPITAL_BASED_OUTPATIENT_CLINIC_OR_DEPARTMENT_OTHER): Payer: Medicare Other | Admitting: Adult Health

## 2018-03-15 VITALS — BP 136/70 | HR 75 | Temp 97.8°F | Resp 18 | Wt 175.7 lb

## 2018-03-15 DIAGNOSIS — C50412 Malignant neoplasm of upper-outer quadrant of left female breast: Secondary | ICD-10-CM | POA: Diagnosis not present

## 2018-03-15 DIAGNOSIS — Z923 Personal history of irradiation: Secondary | ICD-10-CM

## 2018-03-15 DIAGNOSIS — Z5111 Encounter for antineoplastic chemotherapy: Secondary | ICD-10-CM | POA: Diagnosis not present

## 2018-03-15 DIAGNOSIS — K219 Gastro-esophageal reflux disease without esophagitis: Secondary | ICD-10-CM | POA: Diagnosis not present

## 2018-03-15 DIAGNOSIS — Z9221 Personal history of antineoplastic chemotherapy: Secondary | ICD-10-CM

## 2018-03-15 DIAGNOSIS — Z170 Estrogen receptor positive status [ER+]: Secondary | ICD-10-CM

## 2018-03-15 DIAGNOSIS — K30 Functional dyspepsia: Secondary | ICD-10-CM

## 2018-03-15 DIAGNOSIS — Z171 Estrogen receptor negative status [ER-]: Principal | ICD-10-CM

## 2018-03-15 DIAGNOSIS — C50512 Malignant neoplasm of lower-outer quadrant of left female breast: Secondary | ICD-10-CM

## 2018-03-15 MED ORDER — DEXAMETHASONE 4 MG PO TABS: ORAL_TABLET | ORAL | 1 refills | Status: DC

## 2018-03-15 MED ORDER — OMEPRAZOLE 40 MG PO CPDR: 40.0000 mg | DELAYED_RELEASE_CAPSULE | Freq: Every day | ORAL | 5 refills | Status: DC

## 2018-03-15 MED ORDER — PROCHLORPERAZINE MALEATE 10 MG PO TABS: 10.0000 mg | ORAL_TABLET | Freq: Four times a day (QID) | ORAL | 1 refills | Status: DC | PRN

## 2018-03-15 MED ORDER — LORAZEPAM 0.5 MG PO TABS: 0.5000 mg | ORAL_TABLET | Freq: Four times a day (QID) | ORAL | 0 refills | Status: DC | PRN

## 2018-03-15 NOTE — Telephone Encounter (Signed)
Gave avs and calendar ° °

## 2018-03-15 NOTE — Progress Notes (Signed)
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 She underwent bilateral diagnostic mammography with tomography and left breast ultrasonography at [facility] on 02/16/2018 showing: breast density category C; two left breast masses, one at 2 o'clock and the other at 3:30 o'clock. The 2 o'clock mass (1.3 x 1 x 1 cm) corresponds to the mammographic abnormality and is highly suspicious for breast carcinoma. The other mass (1.2 x 0.7 x 1.2 cm)  is suspicious for breast carcinoma. No left axillary adenopathy.   Accordingly on 02/19/2018 she proceeded to biopsy of the left breast area in question. 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 Prognostic indicators significant for: estrogen receptor, 90% positive and progesterone receptor, 95% positive, both with strong staining intensity. Proliferation marker Ki67 at 5%. HER2 negative (1+).   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 Prognostic indicators significant for: estrogen receptor, 10% positive with moderate staining intensity and progesterone receptor, 0% negative. Proliferation marker Ki67  at 40%. HER2 negative (1+).  The patient's subsequent history is as detailed below.   INTERVAL HISTORY: Emily Chambers is here today for follow up of her estrogen positive and estrogen negative breast cancer prior to receiving treatment with neoadjuvant chemotherapy on Wednesday, 03/17/2018.  She will be receiving 4 cycles of neoadjuvant Doxorubicin and Cyclophosphamide with Udenyca support given once every 2 weeks, followed by weekly Paclitaxel and Carboplatin x 12 weeks.  Wednesday 03/17/2018 will be cycle 1 day 1 of treatment.    Since her last visit Emily Chambers has undergone port placement on 03/10/2018, a breast MRI on 03/11/2018 that showed a 0.5cm area of concern in the right breast.  Second look ultrasound and potential biopsy was recommended, and this has been scheduled for tomorrow, 03/16/2018.  She also underwent a complete echocardiogram on 03/11/2018 that showed a LVEF of 60-65%.     REVIEW OF SYSTEMS: Emily Chambers is feeling moderately well today.   She does note some increased indigestion. She denies any other new issues such as fevers, chills.  She isn't having headaches, nausea, vomiting, bowel/bladder changes.  She has not yet picked up her anti emetics.  She denies any chest pain, palpitations, cough, shortness of breath.  She has gone through chemotherapy education.  A detailed ROS was otherwise non contributory.     PAST MEDICAL HISTORY: Past Medical History:  Diagnosis Date  . Family history of breast cancer   . Family history of lung cancer   . Family history of throat cancer   . High cholesterol   . Hypertension   . Kidney stone   . Kidney stones   . UTI (lower urinary tract infection)     PAST SURGICAL HISTORY: Past Surgical History:  Procedure Laterality Date  . BREAST EXCISIONAL BIOPSY Left 1999,2005   cysts removed  .  BREAST SURGERY Left    cyst and biopsy  . PORTACATH PLACEMENT Left 03/10/2018   Procedure: INSERTION PORT-A-CATH;  Surgeon: Stark Klein, MD;  [facility];  Service: General;  Laterality: Left;    FAMILY HISTORY Family History  Problem Relation Age of Onset  . Hypertension Mother   . Heart disease Mother   . Dementia Mother   . Healthy Father   . Colon cancer Maternal Grandmother   . Cancer Paternal Grandmother        unk type  . Lung cancer Cousin        28s  . Breast cancer Cousin        52s  . Breast cancer Cousin        51s  . Breast cancer Cousin   . Cancer Paternal Aunt        unk type d. 101, possibly pancreatic  . Cancer Paternal Aunt        unk type d. 82s  . Cancer Paternal Uncle        unk type   . Cancer Paternal Uncle        unk type d. 72s  . Cancer Maternal Aunt        unk type, d. 27s  . Throat cancer Maternal Uncle        d. 33s  . Ovarian cancer Neg Hx    Patient father is alive at 10 years old. Patient mother died from heart disease and dementia at age 39.  The patient denies a family hx of ovarian cancer. She has 3 siblings, 1 brother and 2 sisters. She has a maternal cousin diagnosed with lung cancer in her 87s. She has 3 paternal cousins with breast cancer, one was diagnosed in her 69s and has passed away.  GYNECOLOGIC HISTORY:  No LMP recorded. Patient is postmenopausal. Menarche: 68 years old Age at first live birth: n/a GX P 0 LMP 2010 Contraceptive n/a HRT no  Hysterectomy? no BSo? no   SOCIAL HISTORY: She is single and works as a Scientist, water quality at [employer]). She lives alone, with no pets.      ADVANCED DIRECTIVES: Not in place.  At the 03/03/2018 visit she was given the appropriate documents to complete and notarize at her discretion.  She is planning to name her niece, Emily Chambers, as her HCPOA.   HEALTH MAINTENANCE: Social History   Tobacco Use  . Smoking status: Never Smoker  . Smokeless tobacco: Never Used  Substance Use Topics  . Alcohol use: Yes    Comment: seldom  . Drug use: No     Colonoscopy: 2014?  [facility]  PAP: 02/02/2018  Bone density: 03/15/2016, T-score 0.4, Dr. Alden Hipp   No Known Allergies  Current Outpatient Medications  Medication Sig Dispense Refill  . amLODipine (NORVASC) 10 MG tablet Take 10 mg by mouth every morning.     Marland Kitchen aspirin 81 MG chewable tablet Chew 81 mg by mouth daily.    Marland Kitchen lidocaine-prilocaine (EMLA) cream Apply 1 application topically as needed. 30 g 0  . losartan (COZAAR) 100 MG tablet Take 100 mg by mouth every morning.     Marland Kitchen oxyCODONE (OXY IR/ROXICODONE) 5 MG immediate release tablet Take 1 tablet (5 mg total) by mouth every 6 (six) hours as needed for severe pain. 10 tablet 0  . pravastatin (PRAVACHOL) 20 MG tablet Take 20 mg by mouth every morning.     Marland Kitchen dexamethasone (DECADRON)  4 MG tablet Take 2 tablets by mouth twice a day for three days starting the day after chemotherapy. Take with food. 30 tablet 1  . LORazepam (ATIVAN) 0.5 MG tablet Take 1 tablet (0.5 mg total) by mouth every 6 (six) hours as needed (Nausea or vomiting). 30 tablet 0  . omeprazole (PRILOSEC) 40 MG capsule Take 1 capsule (40 mg total) by mouth daily. 30 capsule 5  . prochlorperazine (COMPAZINE) 10 MG tablet Take 1 tablet (10 mg total) by mouth every 6 (six) hours as needed (Nausea or vomiting). 30 tablet 1   No current facility-administered medications for this visit.     OBJECTIVE:  Vitals:   03/15/18 1121  BP: 136/70  Pulse: 75  Resp: 18  Temp: 97.8 F (36.6 C)  SpO2: 99%     Body mass index is 32.14 kg/m.   Wt Readings from Last 3 Encounters:  03/15/18 175 lb 11.2 oz (79.7 kg)  03/10/18 173 lb 11.6 oz (78.8 kg)  03/03/18 175 lb 8 oz (79.6 kg)      ECOG FS:0 - Asymptomatic GENERAL: Patient is a well appearing female in no acute distress HEENT:  Sclerae anicteric.  Oropharynx clear and moist. No ulcerations or evidence of oropharyngeal candidiasis. Neck is supple.  NODES:  No cervical, supraclavicular, or axillary lymphadenopathy palpated.  BREAST EXAM:  Unable to  palpate definite breast mass LUNGS:  Clear to auscultation bilaterally.  No wheezes or rhonchi. HEART:  Regular rate and rhythm. No murmur appreciated. ABDOMEN:  Soft, nontender.  Positive, normoactive bowel sounds. No organomegaly palpated. MSK:  No focal spinal tenderness to palpation. Full range of motion bilaterally in the upper extremities. EXTREMITIES:  No peripheral edema.   SKIN:  Clear with no obvious rashes or skin changes. No nail dyscrasia. NEURO:  Nonfocal. Well oriented.  Appropriate affect.     LAB RESULTS:  CMP     Component Value Date/Time   NA 145 03/03/2018 1214   K 3.6 03/03/2018 1214   CL 108 03/03/2018 1214   CO2 28 03/03/2018 1214   GLUCOSE 157 (H) 03/03/2018 1214   BUN 15 03/03/2018 1214   CREATININE 0.80 03/03/2018 1214   CALCIUM 10.3 03/03/2018 1214   PROT 7.7 03/03/2018 1214   ALBUMIN 4.0 03/03/2018 1214   AST 14 (L) 03/03/2018 1214   ALT 19 03/03/2018 1214   ALKPHOS 105 03/03/2018 1214   BILITOT 0.4 03/03/2018 1214   GFRNONAA >60 03/03/2018 1214   GFRAA >60 03/03/2018 1214    No results found for: TOTALPROTELP, ALBUMINELP, A1GS, A2GS, BETS, BETA2SER, GAMS, MSPIKE, SPEI  No results found for: KPAFRELGTCHN, LAMBDASER, KAPLAMBRATIO  Lab Results  Component Value Date   WBC 8.0 03/03/2018   NEUTROABS 4.5 03/03/2018   HGB 12.0 03/03/2018   HCT 39.1 03/03/2018   MCV 89.1 03/03/2018   PLT 212 03/03/2018    '@LASTCHEMISTRY'$ @  No results found for: LABCA2  No components found for: [id number]  No results for input(s): INR in the last 168 hours.  No results found for: LABCA2  No results found for: YQM578  No results found for: ION629  No results found for: BMW413  No results found for: CA2729  No components found for: HGQUANT  No results found for: CEA1 / No results found for: CEA1   No results found for: AFPTUMOR  No results found for: CHROMOGRNA  No results found for: PSA1  No visits with results within 3 Day(s) from this  visit.  Latest known visit with  results is:  Appointment on 03/03/2018  Component Date Value Ref Range Status  . Sodium 03/03/2018 145  135 - 145 mmol/L Final  . Potassium 03/03/2018 3.6  3.5 - 5.1 mmol/L Final  . Chloride 03/03/2018 108  98 - 111 mmol/L Final  . CO2 03/03/2018 28  22 - 32 mmol/L Final  . Glucose, Bld 03/03/2018 157* 70 - 99 mg/dL Final  . BUN 03/03/2018 15  8 - 23 mg/dL Final  . Creatinine 03/03/2018 0.80  0.44 - 1.00 mg/dL Final  . Calcium 03/03/2018 10.3  8.9 - 10.3 mg/dL Final  . Total Protein 03/03/2018 7.7  6.5 - 8.1 g/dL Final  . Albumin 03/03/2018 4.0  3.5 - 5.0 g/dL Final  . AST 03/03/2018 14* 15 - 41 U/L Final  . ALT 03/03/2018 19  0 - 44 U/L Final  . Alkaline Phosphatase 03/03/2018 105  38 - 126 U/L Final  . Total Bilirubin 03/03/2018 0.4  0.3 - 1.2 mg/dL Final  . GFR, Est Non Af Am 03/03/2018 >60  >60 mL/min Final  . GFR, Est AFR Am 03/03/2018 >60  >60 mL/min Final  . Anion gap 03/03/2018 9  5 - 15 Final   Performed at [facility], [place] [address] [place], [place] [address]  . WBC Count 03/03/2018 8.0  4.0 - 10.5 K/uL Final  . RBC 03/03/2018 4.39  3.87 - 5.11 MIL/uL Final  . Hemoglobin 03/03/2018 12.0  12.0 - 15.0 g/dL Final  . HCT 03/03/2018 39.1  36.0 - 46.0 % Final  . MCV 03/03/2018 89.1  80.0 - 100.0 fL Final  . MCH 03/03/2018 27.3  26.0 - 34.0 pg Final  . MCHC 03/03/2018 30.7  30.0 - 36.0 g/dL Final  . RDW 03/03/2018 13.0  11.5 - 15.5 % Final  . Platelet Count 03/03/2018 212  150 - 400 K/uL Final  . nRBC 03/03/2018 0.0  0.0 - 0.2 % Final  . Neutrophils Relative % 03/03/2018 57  % Final  . Neutro Abs 03/03/2018 4.5  1.7 - 7.7 K/uL Final  . Lymphocytes Relative 03/03/2018 35  % Final  . Lymphs Abs 03/03/2018 2.8  0.7 - 4.0 K/uL Final  . Monocytes Relative 03/03/2018 5  % Final  . Monocytes Absolute 03/03/2018 0.4  0.1 - 1.0 K/uL Final  . Eosinophils Relative 03/03/2018 2  % Final  . Eosinophils Absolute 03/03/2018 0.1   0.0 - 0.5 K/uL Final  . Basophils Relative 03/03/2018 1  % Final  . Basophils Absolute 03/03/2018 0.1  0.0 - 0.1 K/uL Final  . Immature Granulocytes 03/03/2018 0  % Final  . Abs Immature Granulocytes 03/03/2018 0.03  0.00 - 0.07 K/uL Final   Performed at [facility], [address] [place], [place] [address]    (this displays the last labs from the last 3 days)  No results found for: TOTALPROTELP, ALBUMINELP, A1GS, A2GS, BETS, BETA2SER, GAMS, MSPIKE, SPEI (this displays SPEP labs)  No results found for: KPAFRELGTCHN, LAMBDASER, KAPLAMBRATIO (kappa/lambda light chains)  No results found for: HGBA, HGBA2QUANT, HGBFQUANT, HGBSQUAN (Hemoglobinopathy evaluation)   No results found for: LDH  No results found for: IRON, TIBC, IRONPCTSAT (Iron and TIBC)  No results found for: FERRITIN  Urinalysis    Component Value Date/Time   COLORURINE BROWN (A) 07/17/2011 1722   APPEARANCEUR TURBID (A) 07/17/2011 1722   LABSPEC 1.025 07/07/2012 1159   PHURINE 6.5 07/07/2012 1159   GLUCOSEU 100 (A) 07/07/2012 1159   HGBUR LARGE (  A) 07/07/2012 1159   BILIRUBINUR SMALL (A) 07/07/2012 1159   KETONESUR NEGATIVE 07/07/2012 1159   PROTEINUR 100 (A) 07/07/2012 1159   UROBILINOGEN 0.2 07/07/2012 1159   NITRITE POSITIVE (A) 07/07/2012 1159   LEUKOCYTESUR NEGATIVE 07/07/2012 1159     STUDIES: Mr Breast Bilateral W Wo Contrast Inc Cad  Result Date: 03/11/2018 CLINICAL DATA:  68 year old patient with recent diagnosis of invasive mammary carcinoma of the left breast at 2 separate biopsy sites, including a mass at 2 o'clock position and a mass at 3:30 position presents for bilateral breast MRI. She has a remote history of a left breast excisional biopsy, upper outer quadrant. LABS:  None obtained EXAM: BILATERAL BREAST MRI WITH AND WITHOUT CONTRAST TECHNIQUE: Multiplanar, multisequence MR images of both breasts were obtained prior to and following the intravenous administration of  8 ml of Gadavist Three-dimensional MR images were rendered by post-processing of the original MR data on an independent workstation. The three-dimensional MR images were interpreted, and findings are reported in the following complete MRI report for this study. Three dimensional images were evaluated at the independent DynaCad workstation COMPARISON:  Previous exam(s). FINDINGS: Breast composition: c. Heterogeneous fibroglandular tissue. Background parenchymal enhancement: Moderate. Right breast: Scattered benign intramammary lymph nodes and scattered foci of enhancement. In the anterior third of the medial right breast, near the level of the nipple, is a slightly irregular enhancing mass with washout kinetics measuring 0.5 x 0.4 cm. While this could be a benign intramammary lymph node, given its medial position and possible slight irregularity in shape, further evaluation is recommended. No mammographic correlate is identified. Left breast: There is signal void artifact related to a biopsy clip and/or biopsy changes in the posterior third of the upper outer left breast at the site the biopsied mass at 2 o'clock position, with a coil shaped biopsy clip placed. This mass measures approximately 1.1 x 0.8 cm on MRI. In the lower outer quadrant of the left breast is an irregular enhancing mass measuring approximately 0.9 x 0.8 cm. This is felt to be this second mass that was biopsied in the 3:30 position of the left breast. Possible biopsy clip artifact on T2 weighted image, adjacent to the small mass. Biopsy clip artifact related to the known ribbon shaped biopsy clip in the left breast is not very distinct on the MRI images. Scattered benign intramammary lymph nodes in the left breast, with fatty hila. Lymph nodes: No abnormal appearing axillary or internal mammary chain lymph nodes. Ancillary findings: Port-A-Cath in the far superior inner left breast. IMPRESSION: Biopsy-proven multicentric malignancy in the left  breast, with malignancies diagnosed at 2 o'clock position and 3:30 position. Small irregular masses are seen at the suspected biopsy sites, measuring 1.1 and 0.9 cm, at 2 o'clock and 3:30 positions, respectively. No additional suspicious areas in the left breast. 0.5 cm slightly irregular enhancing mass with washout kinetics in the anterior third of the medial right breast, 3 o'clock region. Malignancy can not be excluded. RECOMMENDATION: Second-look ultrasound of the right breast is recommended. If a suspicious or indeterminate correlate to the MRI detected mass is identified, ultrasound-guided core needle biopsy can be performed. If a definite benign intramammary lymph node is detected and correlates with the finding on MRI, biopsy may not be necessary. If no correlate is seen on ultrasound, then MRI guided right breast biopsy can be performed. BI-RADS CATEGORY  4: Suspicious. Electronically Signed   By: Curlene Dolphin M.D.   On: 03/11/2018 15:47   Dg  Chest Port 1 View  Result Date: 03/10/2018 CLINICAL DATA:  Port-A-Cath insertion. EXAM: PORTABLE CHEST 1 VIEW COMPARISON:  06/20/2010 FINDINGS: Power port in place. The tip is at the cavoatrial junction in good position. Heart size and vascularity are normal. Lungs are clear. No bone abnormality. IMPRESSION: Port-A-Cath in good position.  No acute abnormalities. Electronically Signed   By: Lorriane Shire M.D.   On: 03/10/2018 16:05   Dg Fluoro Guide Cv Line-no Report  Result Date: 03/10/2018 Fluoroscopy was utilized by the requesting physician.  No radiographic interpretation.   US Breast Ltd Uni Left Inc Axilla  Result Date: 02/16/2018 CLINICAL DATA:  Screening recall for a possible left breast mass. EXAM: DIGITAL DIAGNOSTIC LEFT MAMMOGRAM WITH CAD AND TOMO ULTRASOUND LEFT BREAST COMPARISON:  Previous exam(s). ACR Breast Density Category c: The breast tissue is heterogeneously dense, which may obscure small masses. FINDINGS: The possible mass in the  upper outer left breast persists on the diagnostic spot-compression images. Mass is lobulated and partly circumscribed. There several smaller nodular opacities in the left breast that similar to prior mammograms. There is postsurgical architectural distortion in the upper outer quadrant. No suspicious calcifications. Mammographic images were processed with CAD. On physical exam, a small firm mass is palpated the left breast near 2 o'clock. There is a generalized nodular texture to tissue throughout the upper outer and lateral aspects of left breast. Targeted ultrasound is performed, showing a hypoechoic mass with partly lobulated partly ill-defined margins in the left breast at 2 o'clock, 5 cm from the nipple, measuring 13 x 10 x 10 mm. There is internal blood flow on color Doppler analysis. In the 3:30 o'clock position, 6 cm the nipple, there is a less well-defined hypoechoic mass that measures 12 x 7 x 12 mm. Sonographic evaluation of the left axilla shows no enlarged or abnormal lymph nodes. IMPRESSION: 1. Two left breast masses, 1 at 2 o'clock and the other at 3:30 o'clock. The 2 o'clock mass corresponds to the mammographic abnormality and is highly suspicious for breast carcinoma. The other mass is suspicious for breast carcinoma. No left axillary adenopathy. RECOMMENDATION: 1. Ultrasound-guided core needle biopsy both the 2 o'clock and 3:30 o'clock position left breast masses. I have discussed the findings and recommendations with the patient. Results were also provided in writing at the conclusion of the visit. If applicable, a reminder letter will be sent to the patient regarding the next appointment. BI-RADS CATEGORY  5: Highly suggestive of malignancy. Electronically Signed   By: Lajean Manes M.D.   On: 02/16/2018 09:32   Mm Diag Breast Tomo Uni Left  Result Date: 02/16/2018 CLINICAL DATA:  Screening recall for a possible left breast mass. EXAM: DIGITAL DIAGNOSTIC LEFT MAMMOGRAM WITH CAD AND TOMO  ULTRASOUND LEFT BREAST COMPARISON:  Previous exam(s). ACR Breast Density Category c: The breast tissue is heterogeneously dense, which may obscure small masses. FINDINGS: The possible mass in the upper outer left breast persists on the diagnostic spot-compression images. Mass is lobulated and partly circumscribed. There several smaller nodular opacities in the left breast that similar to prior mammograms. There is postsurgical architectural distortion in the upper outer quadrant. No suspicious calcifications. Mammographic images were processed with CAD. On physical exam, a small firm mass is palpated the left breast near 2 o'clock. There is a generalized nodular texture to tissue throughout the upper outer and lateral aspects of left breast. Targeted ultrasound is performed, showing a hypoechoic mass with partly lobulated partly ill-defined margins in the left breast  at 2 o'clock, 5 cm from the nipple, measuring 13 x 10 x 10 mm. There is internal blood flow on color Doppler analysis. In the 3:30 o'clock position, 6 cm the nipple, there is a less well-defined hypoechoic mass that measures 12 x 7 x 12 mm. Sonographic evaluation of the left axilla shows no enlarged or abnormal lymph nodes. IMPRESSION: 1. Two left breast masses, 1 at 2 o'clock and the other at 3:30 o'clock. The 2 o'clock mass corresponds to the mammographic abnormality and is highly suspicious for breast carcinoma. The other mass is suspicious for breast carcinoma. No left axillary adenopathy. RECOMMENDATION: 1. Ultrasound-guided core needle biopsy both the 2 o'clock and 3:30 o'clock position left breast masses. I have discussed the findings and recommendations with the patient. Results were also provided in writing at the conclusion of the visit. If applicable, a reminder letter will be sent to the patient regarding the next appointment. BI-RADS CATEGORY  5: Highly suggestive of malignancy. Electronically Signed   By: Lajean Manes M.D.   On:  02/16/2018 09:32   Mm Clip Placement Left  Result Date: 02/19/2018 CLINICAL DATA:  Status post ultrasound-guided core needle biopsies of a 1.2 cm mass in the 3:30 o'clock position of the left breast and a 1.3 cm mass in the 2 o'clock position of the left breast. EXAM: DIAGNOSTIC LEFT MAMMOGRAM POST ULTRASOUND BIOPSY X 2 COMPARISON:  Previous exam(s). FINDINGS: Mammographic images were obtained following ultrasound guided biopsy of the recently demonstrated 1.2 cm mass in the 3:30 o'clock position of the left breast and 1.3 cm mass in the 2 o'clock position of the left breast. These demonstrate a ribbon shaped biopsy marker clip at the expected location of the biopsied mass in 3:30 o'clock position of the breast and a coil shaped biopsy marker clip at the expected location of the biopsied mass in the 2 o'clock position the left breast. The clips are located 4.4 cm apart. IMPRESSION: Appropriate clip deployment following 2 left breast ultrasound-guided core needle biopsies. Final Assessment: Post Procedure Mammograms for Marker Placement Electronically Signed   By: Claudie Revering M.D.   On: 02/19/2018 16:46   [place] Lt Breast Bx W Loc Dev 1st Lesion Img Bx Spec US Guide  Addendum Date: 02/23/2018   ADDENDUM REPORT: 02/23/2018 09:50 ADDENDUM: Pathology revealed GRADE II-III INVASIVE MAMMARY CARCINOMA of the Left breast, 3:30 o'clock. 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 This was found to be concordant by Dr. Claudie Revering. Pathology results were discussed with the patient by telephone. The patient reported doing well after the biopsies with tenderness at the sites. Post biopsy instructions and care were reviewed and questions were answered. The patient was encouraged to call [facility] for any additional concerns. The patient was referred to [facility] on March 03, 2018. Pathology  results reported by Terie Purser, RN on 02/23/2018. Electronically Signed   By: Claudie Revering M.D.   On: 02/23/2018 09:50   Result Date: 02/23/2018 CLINICAL DATA:  1.2 cm mass in the 3:30 o'clock position of the left breast and 1.3 cm mass in the 2 o'clock position of the left breast at recent ultrasound, both highly suspicious for malignancy. EXAM: ULTRASOUND GUIDED LEFT BREAST CORE NEEDLE BIOPSY X 2 COMPARISON:  Previous exam(s). FINDINGS: I met with the patient and we discussed the procedure of ultrasound-guided biopsy, including benefits and alternatives. We discussed the high likelihood  of a successful procedure. We discussed the risks of the procedure, including infection, bleeding, tissue injury, clip migration, and inadequate sampling. Informed written consent was given. The usual time-out protocol was performed immediately prior to the procedure. SITE #1: LEFT BREAST 3:30 O'CLOCK Lesion quadrant: Lower outer quadrant Using sterile technique and 1% Lidocaine as local anesthetic, under direct ultrasound visualization, a 12 gauge spring-loaded device was used to perform biopsy of the recently demonstrated 1.2 cm mass in the 3:30 o'clock position of the left breast using a caudal approach. At the conclusion of the procedure a ribbon shaped tissue marker clip was deployed into the biopsy cavity. Follow up 2 view mammogram was performed and dictated separately. SITE #2: LEFT BREAST 2 O'CLOCK Lesion quadrant: Upper outer quadrant Using sterile technique and 1% Lidocaine as local anesthetic, under direct ultrasound visualization, a 12 gauge spring-loaded device was used to perform biopsy of the recently demonstrated 1.3 cm mass in the 2 o'clock position of the left breast using a caudal approach. At the conclusion of the procedure a coil shaped tissue marker clip was deployed into the biopsy cavity. Follow up 2 view mammogram was performed and dictated separately. IMPRESSION: Ultrasound guided biopsy of the  recently demonstrated 1 2 cm mass in the 3:30 o'clock position of the left breast and 1.3 cm mass in the 2 o'clock position of the left breast. No apparent complications. Electronically Signed: By: Claudie Revering M.D. 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 This was found to be concordant by Dr. Claudie Revering. Pathology results were discussed with the patient by telephone. The patient reported doing well after the biopsies with tenderness at the sites. Post biopsy instructions and care were reviewed and questions were answered. The patient was encouraged to call [facility] for any additional concerns. The patient was referred to [facility] on March 03, 2018. Pathology results reported by Terie Purser, RN on 02/23/2018. Electronically Signed   By: Claudie Revering M.D.   On: 02/23/2018 09:50   Result Date: 02/23/2018 CLINICAL DATA:  1.2 cm mass in the 3:30 o'clock position of the left breast and 1.3 cm mass in the 2 o'clock position of the left breast at recent ultrasound, both highly suspicious for malignancy. EXAM: ULTRASOUND GUIDED LEFT BREAST CORE NEEDLE BIOPSY X 2 COMPARISON:  Previous exam(s). FINDINGS: I met with the patient and we discussed the procedure of ultrasound-guided biopsy, including benefits and alternatives. We discussed the high likelihood of a successful procedure. We discussed the risks of the procedure, including infection, bleeding, tissue injury, clip migration, and inadequate sampling. Informed written consent was given. The usual time-out protocol was performed immediately prior to the procedure. SITE #1: LEFT  BREAST 3:30 O'CLOCK Lesion quadrant: Lower outer quadrant Using sterile technique and 1% Lidocaine as local anesthetic, under direct ultrasound visualization, a 12 gauge spring-loaded device was used to perform biopsy of the recently demonstrated 1.2 cm mass in the 3:30 o'clock position of the left breast using a caudal approach. At the conclusion of the procedure a ribbon shaped tissue marker clip was deployed into the biopsy cavity. Follow up 2 view mammogram was performed and  dictated separately. SITE #2: LEFT BREAST 2 O'CLOCK Lesion quadrant: Upper outer quadrant Using sterile technique and 1% Lidocaine as local anesthetic, under direct ultrasound visualization, a 12 gauge spring-loaded device was used to perform biopsy of the recently demonstrated 1.3 cm mass in the 2 o'clock position of the left breast using a caudal approach. At the conclusion of the procedure a coil shaped tissue marker clip was deployed into the biopsy cavity. Follow up 2 view mammogram was performed and dictated separately. IMPRESSION: Ultrasound guided biopsy of the recently demonstrated 1 2 cm mass in the 3:30 o'clock position of the left breast and 1.3 cm mass in the 2 o'clock position of the left breast. No apparent complications. Electronically Signed: By: Claudie Revering M.D. On: 02/19/2018 16:26    ELIGIBLE FOR AVAILABLE RESEARCH PROTOCOL: Upbeat  ASSESSMENT: 68 y.o. 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, grade 2, estrogen and progesterone receptor positive, HER-2 not amplified, with an MIB-1 of 5%  (b) in the lower outer quadrant a clinical T1c N0, stage IA-B invasive ductal carcinoma, grade 2, estrogen receptor only moderately positive at 10%, progesterone receptor negative, with an MIB-1 of 40%, and HER-2 not amplified  (1) neoadjuvant chemotherapy will consist of doxorubicin and  cyclophosphamide in dose dense fashion x4 starting 03/16/2018, followed by paclitaxel and carboplatin weekly x12  (a)echo on 03/11/2018 shows well preserved EF of 60-65%  (2) definitive surgery to follow  (3) adjuvant radiation as appropriate  (4) antiestrogens to follow at the completion of local treatment (for upper outer quadrant tumor)  (5) genetics testing pending  PLAN:  Emily Chambers is doing well today.  She has a good understanding of the potential side effects, but has kept an open mind about her upcoming treatments, stating, "everyone is different and I don't know how it will effect me until I go through it."  I sent in her anti nausea medications and reviewed in detail her anti nausea map and explained how to take her medications.  We reviewed her chemotherapy and the Udenyca today as well.  She will start chemo on Wednesday.    Emily Chambers has had some indigestion and reflux.  I sent in some Omeprazole for her to go ahead and start taking first thing in the morning on an empty stomach.  She will be receiving Dexamethasone during her treatment, which can also cause stomach indigestion/upset, so it will be good to go ahead and neutralize that if possible.    Emily Chambers and I reviewed reasons to call.  I let her know to please call for any questions or concerns whatsoever.  Since we don't know what her side effects will be, and she isn't sure what to expect, we are happy to answer any questions or concerns she may have following chemotherapy.  I let her know that there are many things that we can help her with if needed, to please call if needed.    Emily Chambers will return in two days for [facility], and in one week for labs and f/u.  She will call with any problems that may develop before her next visit here.  A total of (30) minutes of face-to-face time was spent with this patient with greater than 50% of that time in counseling and care-coordination.   Scot Dock, NP   03/15/2018 1:32 PM Medical  Oncology and Hematology [address]  [place], [place] [address] Tel. [phone]    Fax. [phone]

## 2018-03-16 ENCOUNTER — Other Ambulatory Visit: Payer: Self-pay | Admitting: General Surgery

## 2018-03-16 ENCOUNTER — Ambulatory Visit
Admission: RE | Admit: 2018-03-16 | Discharge: 2018-03-16 | Disposition: A | Discharge status: Home / Self Care | Payer: Medicare Other | Source: Ambulatory Visit | Attending: General Surgery | Admitting: General Surgery

## 2018-03-16 DIAGNOSIS — C50812 Malignant neoplasm of overlapping sites of left female breast: Secondary | ICD-10-CM

## 2018-03-17 ENCOUNTER — Encounter: Payer: Self-pay | Admitting: *Deleted

## 2018-03-17 ENCOUNTER — Inpatient Hospital Stay: Payer: Medicare Other

## 2018-03-17 ENCOUNTER — Other Ambulatory Visit: Payer: Self-pay | Admitting: Oncology

## 2018-03-17 VITALS — BP 126/84 | HR 71 | Temp 98.0°F | Resp 16

## 2018-03-17 DIAGNOSIS — Z5111 Encounter for antineoplastic chemotherapy: Secondary | ICD-10-CM | POA: Diagnosis not present

## 2018-03-17 DIAGNOSIS — Z95828 Presence of other vascular implants and grafts: Secondary | ICD-10-CM

## 2018-03-17 DIAGNOSIS — Z171 Estrogen receptor negative status [ER-]: Secondary | ICD-10-CM

## 2018-03-17 DIAGNOSIS — C50512 Malignant neoplasm of lower-outer quadrant of left female breast: Secondary | ICD-10-CM

## 2018-03-17 DIAGNOSIS — Z170 Estrogen receptor positive status [ER+]: Secondary | ICD-10-CM

## 2018-03-17 DIAGNOSIS — C50412 Malignant neoplasm of upper-outer quadrant of left female breast: Secondary | ICD-10-CM

## 2018-03-17 DIAGNOSIS — Z803 Family history of malignant neoplasm of breast: Secondary | ICD-10-CM

## 2018-03-17 LAB — CBC WITH DIFFERENTIAL (CANCER CENTER ONLY)
Abs Immature Granulocytes: 0.03 10*3/uL (ref 0.00–0.07)
Basophils Absolute: 0.1 10*3/uL (ref 0.0–0.1)
Basophils Relative: 1 %
Eosinophils Absolute: 0.4 10*3/uL (ref 0.0–0.5)
Eosinophils Relative: 4 %
HCT: 37.7 % (ref 36.0–46.0)
Hemoglobin: 11.5 g/dL — ABNORMAL LOW (ref 12.0–15.0)
Immature Granulocytes: 0 %
Lymphocytes Relative: 25 %
Lymphs Abs: 2.2 10*3/uL (ref 0.7–4.0)
MCH: 27.3 pg (ref 26.0–34.0)
MCHC: 30.5 g/dL (ref 30.0–36.0)
MCV: 89.3 fL (ref 80.0–100.0)
Monocytes Absolute: 0.4 10*3/uL (ref 0.1–1.0)
Monocytes Relative: 5 %
NEUTROS ABS: 5.8 10*3/uL (ref 1.7–7.7)
Neutrophils Relative %: 65 %
Platelet Count: 183 10*3/uL (ref 150–400)
RBC: 4.22 MIL/uL (ref 3.87–5.11)
RDW: 12.9 % (ref 11.5–15.5)
WBC Count: 8.9 10*3/uL (ref 4.0–10.5)
nRBC: 0 % (ref 0.0–0.2)

## 2018-03-17 LAB — CMP (CANCER CENTER ONLY)
ALT: 16 U/L (ref 0–44)
AST: 12 U/L — ABNORMAL LOW (ref 15–41)
Albumin: 3.8 g/dL (ref 3.5–5.0)
Alkaline Phosphatase: 90 U/L (ref 38–126)
Anion gap: 10 (ref 5–15)
BUN: 15 mg/dL (ref 8–23)
CHLORIDE: 108 mmol/L (ref 98–111)
CO2: 24 mmol/L (ref 22–32)
Calcium: 9.8 mg/dL (ref 8.9–10.3)
Creatinine: 0.74 mg/dL (ref 0.44–1.00)
GFR, Est AFR Am: >60 mL/min (ref >60)
Glucose, Bld: 140 mg/dL — ABNORMAL HIGH (ref 70–99)
Potassium: 3.3 mmol/L — ABNORMAL LOW (ref 3.5–5.1)
Sodium: 142 mmol/L (ref 135–145)
Total Bilirubin: 0.4 mg/dL (ref 0.3–1.2)
Total Protein: 7.4 g/dL (ref 6.5–8.1)

## 2018-03-17 MED ORDER — SODIUM CHLORIDE 0.9 % IV SOLN
Freq: Once | INTRAVENOUS | Status: AC
  Administered 2018-03-17: 10:00:00 via INTRAVENOUS
  Filled 2018-03-17: qty 5

## 2018-03-17 MED ORDER — HEPARIN SOD (PORK) LOCK FLUSH 100 UNIT/ML IV SOLN
500.0000 [IU] | Freq: Once | INTRAVENOUS | Status: AC | PRN
  Administered 2018-03-17: 500 [IU]
  Filled 2018-03-17: qty 5

## 2018-03-17 MED ORDER — PALONOSETRON HCL INJECTION 0.25 MG/5ML
INTRAVENOUS | Status: AC
  Filled 2018-03-17: qty 5

## 2018-03-17 MED ORDER — SODIUM CHLORIDE 0.9 % IV SOLN
600.0000 mg/m2 | Freq: Once | INTRAVENOUS | Status: AC
  Administered 2018-03-17: 1120 mg via INTRAVENOUS
  Filled 2018-03-17: qty 56

## 2018-03-17 MED ORDER — SODIUM CHLORIDE 0.9 % IV SOLN
Freq: Once | INTRAVENOUS | Status: AC
  Administered 2018-03-17: 09:00:00 via INTRAVENOUS
  Filled 2018-03-17: qty 250

## 2018-03-17 MED ORDER — PALONOSETRON HCL INJECTION 0.25 MG/5ML
0.2500 mg | Freq: Once | INTRAVENOUS | Status: AC
  Administered 2018-03-17: 0.25 mg via INTRAVENOUS

## 2018-03-17 MED ORDER — DOXORUBICIN HCL CHEMO IV INJECTION 2 MG/ML
60.0000 mg/m2 | Freq: Once | INTRAVENOUS | Status: AC
  Administered 2018-03-17: 112 mg via INTRAVENOUS
  Filled 2018-03-17: qty 56

## 2018-03-17 MED ORDER — SODIUM CHLORIDE 0.9% FLUSH
10.0000 mL | INTRAVENOUS | Status: DC | PRN
  Administered 2018-03-17: 10 mL
  Filled 2018-03-17: qty 10

## 2018-03-17 MED ORDER — SODIUM CHLORIDE 0.9% FLUSH
10.0000 mL | Freq: Once | INTRAVENOUS | Status: AC
  Administered 2018-03-17: 10 mL
  Filled 2018-03-17: qty 10

## 2018-03-17 NOTE — Patient Instructions (Signed)
[facility] Discharge Instructions for Patients Receiving Chemotherapy  Today you received the following chemotherapy agents Doxorubicinl, Cytoxan.  To help prevent nausea and vomiting after your treatment, we encourage you to take your nausea medication DO NOT TAKE ONDANSTARON (ZOFRAN) FOR THREE DAYS AFTER TREATMENT.   If you develop nausea and vomiting that is not controlled by your nausea medication, call the clinic.   BELOW ARE SYMPTOMS THAT SHOULD BE REPORTED IMMEDIATELY:  *FEVER GREATER THAN 100.5 F  *CHILLS WITH OR WITHOUT FEVER  NAUSEA AND VOMITING THAT IS NOT CONTROLLED WITH YOUR NAUSEA MEDICATION  *UNUSUAL SHORTNESS OF BREATH  *UNUSUAL BRUISING OR BLEEDING  TENDERNESS IN MOUTH AND THROAT WITH OR WITHOUT PRESENCE OF ULCERS  *URINARY PROBLEMS  *BOWEL PROBLEMS  UNUSUAL RASH Items with * indicate a potential emergency and should be followed up as soon as possible.  Feel free to call the clinic should you have any questions or concerns. The clinic phone number is [phone].  Please show the [facility] at check-in to the Emergency Department and triage nurse.

## 2018-03-17 NOTE — Progress Notes (Signed)
First treatment, Doxo, Cytoxan. Pt. Tolerated well. All questions answered.

## 2018-03-19 ENCOUNTER — Inpatient Hospital Stay: Payer: Medicare Other

## 2018-03-19 VITALS — BP 137/73 | HR 73 | Temp 97.8°F | Resp 16

## 2018-03-19 DIAGNOSIS — Z5111 Encounter for antineoplastic chemotherapy: Secondary | ICD-10-CM | POA: Diagnosis not present

## 2018-03-19 DIAGNOSIS — C50412 Malignant neoplasm of upper-outer quadrant of left female breast: Secondary | ICD-10-CM

## 2018-03-19 DIAGNOSIS — C50512 Malignant neoplasm of lower-outer quadrant of left female breast: Secondary | ICD-10-CM

## 2018-03-19 DIAGNOSIS — Z171 Estrogen receptor negative status [ER-]: Principal | ICD-10-CM

## 2018-03-19 DIAGNOSIS — Z170 Estrogen receptor positive status [ER+]: Secondary | ICD-10-CM

## 2018-03-19 MED ORDER — PEGFILGRASTIM-CBQV 6 MG/0.6ML ~~LOC~~ SOSY
PREFILLED_SYRINGE | SUBCUTANEOUS | Status: AC
  Filled 2018-03-19: qty 0.6

## 2018-03-19 MED ORDER — PEGFILGRASTIM-CBQV 6 MG/0.6ML ~~LOC~~ SOSY
6.0000 mg | PREFILLED_SYRINGE | Freq: Once | SUBCUTANEOUS | Status: AC
  Administered 2018-03-19: 6 mg via SUBCUTANEOUS

## 2018-03-22 ENCOUNTER — Other Ambulatory Visit: Payer: Self-pay | Admitting: Adult Health

## 2018-03-22 DIAGNOSIS — C50512 Malignant neoplasm of lower-outer quadrant of left female breast: Secondary | ICD-10-CM

## 2018-03-22 DIAGNOSIS — C50412 Malignant neoplasm of upper-outer quadrant of left female breast: Secondary | ICD-10-CM

## 2018-03-22 DIAGNOSIS — Z171 Estrogen receptor negative status [ER-]: Principal | ICD-10-CM

## 2018-03-22 DIAGNOSIS — Z170 Estrogen receptor positive status [ER+]: Secondary | ICD-10-CM

## 2018-03-23 ENCOUNTER — Other Ambulatory Visit: Payer: Self-pay | Admitting: Adult Health

## 2018-03-23 ENCOUNTER — Inpatient Hospital Stay (HOSPITAL_BASED_OUTPATIENT_CLINIC_OR_DEPARTMENT_OTHER): Payer: Medicare Other | Admitting: Adult Health

## 2018-03-23 ENCOUNTER — Other Ambulatory Visit: Payer: Self-pay | Admitting: Oncology

## 2018-03-23 ENCOUNTER — Encounter: Payer: Self-pay | Admitting: Adult Health

## 2018-03-23 ENCOUNTER — Inpatient Hospital Stay: Payer: Medicare Other

## 2018-03-23 VITALS — BP 133/76 | HR 66 | Temp 98.3°F | Resp 16 | Wt 175.3 lb

## 2018-03-23 DIAGNOSIS — E860 Dehydration: Secondary | ICD-10-CM

## 2018-03-23 DIAGNOSIS — K219 Gastro-esophageal reflux disease without esophagitis: Secondary | ICD-10-CM

## 2018-03-23 DIAGNOSIS — Z170 Estrogen receptor positive status [ER+]: Secondary | ICD-10-CM

## 2018-03-23 DIAGNOSIS — Z923 Personal history of irradiation: Secondary | ICD-10-CM

## 2018-03-23 DIAGNOSIS — C50512 Malignant neoplasm of lower-outer quadrant of left female breast: Secondary | ICD-10-CM | POA: Diagnosis not present

## 2018-03-23 DIAGNOSIS — Z171 Estrogen receptor negative status [ER-]: Principal | ICD-10-CM

## 2018-03-23 DIAGNOSIS — K30 Functional dyspepsia: Secondary | ICD-10-CM

## 2018-03-23 DIAGNOSIS — C50412 Malignant neoplasm of upper-outer quadrant of left female breast: Secondary | ICD-10-CM

## 2018-03-23 DIAGNOSIS — Z5111 Encounter for antineoplastic chemotherapy: Secondary | ICD-10-CM | POA: Diagnosis not present

## 2018-03-23 DIAGNOSIS — E876 Hypokalemia: Secondary | ICD-10-CM | POA: Diagnosis not present

## 2018-03-23 DIAGNOSIS — R51 Headache: Secondary | ICD-10-CM

## 2018-03-23 DIAGNOSIS — Z9221 Personal history of antineoplastic chemotherapy: Secondary | ICD-10-CM

## 2018-03-23 LAB — CBC WITH DIFFERENTIAL (CANCER CENTER ONLY)
Abs Immature Granulocytes: 0.21 10*3/uL — ABNORMAL HIGH (ref 0.00–0.07)
Basophils Absolute: 0.1 10*3/uL (ref 0.0–0.1)
Basophils Relative: 1 %
Eosinophils Absolute: 0.6 10*3/uL — ABNORMAL HIGH (ref 0.0–0.5)
Eosinophils Relative: 5 %
HEMATOCRIT: 35.6 % — AB (ref 36.0–46.0)
Hemoglobin: 11 g/dL — ABNORMAL LOW (ref 12.0–15.0)
Immature Granulocytes: 2 %
Lymphocytes Relative: 16 %
Lymphs Abs: 1.9 10*3/uL (ref 0.7–4.0)
MCH: 27.4 pg (ref 26.0–34.0)
MCHC: 30.9 g/dL (ref 30.0–36.0)
MCV: 88.8 fL (ref 80.0–100.0)
Monocytes Absolute: 0.2 10*3/uL (ref 0.1–1.0)
Monocytes Relative: 2 %
NEUTROS ABS: 8.9 10*3/uL — AB (ref 1.7–7.7)
Neutrophils Relative %: 74 %
Platelet Count: 143 10*3/uL — ABNORMAL LOW (ref 150–400)
RBC: 4.01 MIL/uL (ref 3.87–5.11)
RDW: 12.8 % (ref 11.5–15.5)
WBC Count: 11.9 10*3/uL — ABNORMAL HIGH (ref 4.0–10.5)
nRBC: 0 % (ref 0.0–0.2)

## 2018-03-23 LAB — CMP (CANCER CENTER ONLY)
ALT: 21 U/L (ref 0–44)
AST: 11 U/L — ABNORMAL LOW (ref 15–41)
Albumin: 3.6 g/dL (ref 3.5–5.0)
Alkaline Phosphatase: 147 U/L — ABNORMAL HIGH (ref 38–126)
Anion gap: 10 (ref 5–15)
BUN: 15 mg/dL (ref 8–23)
CO2: 28 mmol/L (ref 22–32)
Calcium: 8.9 mg/dL (ref 8.9–10.3)
Chloride: 104 mmol/L (ref 98–111)
Creatinine: 0.7 mg/dL (ref 0.44–1.00)
GFR, Est AFR Am: >60 mL/min (ref >60)
GFR, Estimated: >60 mL/min (ref >60)
Glucose, Bld: 95 mg/dL (ref 70–99)
Potassium: 2.9 mmol/L — CL (ref 3.5–5.1)
Sodium: 142 mmol/L (ref 135–145)
Total Bilirubin: 0.3 mg/dL (ref 0.3–1.2)
Total Protein: 6.8 g/dL (ref 6.5–8.1)

## 2018-03-23 MED ORDER — POTASSIUM CHLORIDE IN NACL 20-0.9 MEQ/L-% IV SOLN
Freq: Once | INTRAVENOUS | Status: AC
  Administered 2018-03-23: 14:00:00 via INTRAVENOUS
  Filled 2018-03-23: qty 1000

## 2018-03-23 MED ORDER — POTASSIUM CHLORIDE CRYS ER 20 MEQ PO TBCR: 20.0000 meq | EXTENDED_RELEASE_TABLET | Freq: Every day | ORAL | 0 refills | Status: DC

## 2018-03-23 NOTE — Research (Signed)
DCP-001- Consent  Patient was given copies of the informed consent and HIPAA form for DCP-001. Both the consent and authorization forms were reviewed with the patient in their entirety.  The patient is aware that this involves one-time consent, and the collection of demographic data, with the majority of data being collected from her medical record. Patient is aware of the purpose of the project and the possible risks and benefits of the project. She is also aware of the voluntary nature of the project and her choice to stop participation at any time. This RN explained to pt how her personal information will be used. All of patient's questions were answered to her satisfaction and she agreed to participate. The consent and authorization forms were signed and dated by patient at 4. Copies of both the consent and authorization forms were given to patient. Patient meets eligibility and will be enrolled in the DCP-001 study.  Johney Maine RN, BSN Clinical Research  03/23/2018 1351

## 2018-03-23 NOTE — Progress Notes (Addendum)
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 She underwent bilateral diagnostic mammography with tomography and left breast ultrasonography at [facility] on 02/16/2018 showing: breast density category C; two left breast masses, one at 2 o'clock and the other at 3:30 o'clock. The 2 o'clock mass (1.3 x 1 x 1 cm) corresponds to the mammographic abnormality and is highly suspicious for breast carcinoma. The other mass (1.2 x 0.7 x 1.2 cm)  is suspicious for breast carcinoma. No left axillary adenopathy.   Accordingly on 02/19/2018 she proceeded to biopsy of the left breast area in question. 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 Prognostic indicators significant for: estrogen receptor, 90% positive and progesterone receptor, 95% positive, both with strong staining intensity. Proliferation marker Ki67 at 5%. HER2 negative (1+).   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 Prognostic indicators significant for: estrogen receptor, 10% positive with moderate staining intensity and progesterone receptor, 0% negative. Proliferation marker Ki67  at 40%. HER2 negative (1+).  The patient's subsequent history is as detailed below.   INTERVAL HISTORY: Emily Chambers is here today for follow up of her estrogen positive and estrogen negative breast cancer prior to receiving treatment with neoadjuvant chemotherapy on Wednesday, 03/17/2018.  She will be receiving 4 cycles of neoadjuvant Doxorubicin and Cyclophosphamide with Udenyca support given once every 2 weeks, followed by weekly Paclitaxel and Carboplatin x 12 weeks.  Today is cycle 1 day 8 of treatment.    REVIEW OF SYSTEMS: Emily Chambers is feeling "ok" today.  She received her first treatment last week.  She was nauseated and this started the day she received her treatment.  The anti emetics she notes didn't seem like they helped.  She did not vomit.  Emily Chambers has noted constant headaches that started within the past week or so, perhaps prior to her diagnosis of breast cancer.  The headaches are located in the front of her headache.  She thinks it is worse at night.  They do not radiate anywhere.  She is denies vision changes, numbness.  She feels like her ear is swollen.    Emily Chambers notes she has not been drinking enough fluids.  She has been eating.  She denies diarrhea.  She notes the diarrhea will help the nausea some, but not completely.  She is taking a stool softener daily and notes that she thought she was going to be constipated when she had a bowel movement today, but noted that it was not uncomfortable due to her stool softeners.  She denies cough, shortness of breath, chest pain, palpitations.  She denies vomiting, indigestion.     PAST MEDICAL HISTORY: Past Medical History:  Diagnosis Date  . Family history of breast cancer   . Family history of lung cancer   . Family  history of throat cancer   . High cholesterol   . Hypertension   . Kidney stone   . Kidney stones   . UTI (lower urinary tract infection)     PAST SURGICAL HISTORY: Past Surgical History:  Procedure Laterality  Date  . BREAST EXCISIONAL BIOPSY Left 1999,2005   cysts removed  . BREAST SURGERY Left    cyst and biopsy  . PORTACATH PLACEMENT Left 03/10/2018   Procedure: INSERTION PORT-A-CATH;  Surgeon: Stark Klein, MD;  Location: [place];  Service: General;  Laterality: Left;    FAMILY HISTORY Family History  Problem Relation Age of Onset  . Hypertension Mother   . Heart disease Mother   . Dementia Mother   . Healthy Father   . Colon cancer Maternal Grandmother   . Cancer Paternal Grandmother        unk type  . Lung cancer Cousin        78s  . Breast cancer Cousin        48s  . Breast cancer Cousin        18s  . Breast cancer Cousin   . Cancer Paternal Aunt        unk type d. 59, possibly pancreatic  . Cancer Paternal Aunt        unk type d. 27s  . Cancer Paternal Uncle        unk type   . Cancer Paternal Uncle        unk type d. 45s  . Cancer Maternal Aunt        unk type, d. 43s  . Throat cancer Maternal Uncle        d. 67s  . Ovarian cancer Neg Hx    Patient father is alive at 12 years old. Patient mother died from heart disease and dementia at age 11.  The patient denies a family hx of ovarian cancer. She has 3 siblings, 1 brother and 2 sisters. She has a maternal cousin diagnosed with lung cancer in her 64s. She has 3 paternal cousins with breast cancer, one was diagnosed in her 71s and has passed away.  GYNECOLOGIC HISTORY:  No LMP recorded. Patient is postmenopausal. Menarche: 68 years old Age at first live birth: n/a GX P 0 LMP 2010 Contraceptive n/a HRT no  Hysterectomy? no BSo? no   SOCIAL HISTORY: She is single and works as a Scientist, water quality at [employer]). She lives alone, with no pets.      ADVANCED DIRECTIVES: Not in place.  At the 03/03/2018 visit she was given the appropriate documents to complete and notarize at her discretion.  She is planning to name her niece, [name], as her HCPOA.   HEALTH  MAINTENANCE: Social History   Tobacco Use  . Smoking status: Never Smoker  . Smokeless tobacco: Never Used  Substance Use Topics  . Alcohol use: Yes    Comment: seldom  . Drug use: No     Colonoscopy: 2014? [facility]  PAP: 02/02/2018  Bone density: 03/15/2016, T-score 0.4, Dr. Alden Hipp   No Known Allergies  Current Outpatient Medications  Medication Sig Dispense Refill  . amLODipine (NORVASC) 10 MG tablet Take 10 mg by mouth every morning.     Marland Kitchen aspirin 81 MG chewable tablet Chew 81 mg by mouth daily.    Marland Kitchen dexamethasone (DECADRON) 4 MG tablet Take 2 tablets by mouth twice a day for three days starting the day after chemotherapy. Take  with food. 30 tablet 1  . lidocaine-prilocaine (EMLA) cream Apply 1 application topically as needed. 30 g 0  . LORazepam (ATIVAN) 0.5 MG tablet Take 1 tablet (0.5 mg total) by mouth every 6 (six) hours as needed (Nausea or vomiting). 30 tablet 0  . losartan (COZAAR) 100 MG tablet Take 100 mg by mouth every morning.     Marland Kitchen omeprazole (PRILOSEC) 40 MG capsule Take 1 capsule (40 mg total) by mouth daily. 30 capsule 5  . oxyCODONE (OXY IR/ROXICODONE) 5 MG immediate release tablet Take 1 tablet (5 mg total) by mouth every 6 (six) hours as needed for severe pain. 10 tablet 0  . pravastatin (PRAVACHOL) 20 MG tablet Take 20 mg by mouth every morning.     . prochlorperazine (COMPAZINE) 10 MG tablet Take 1 tablet (10 mg total) by mouth every 6 (six) hours as needed (Nausea or vomiting). 30 tablet 1   No current facility-administered medications for this visit.     OBJECTIVE:  Vitals:   03/23/18 1303  BP: 133/76  Pulse: 66  Resp: 16  Temp: 98.3 F (36.8 C)  SpO2: 100%     Body mass index is 32.07 kg/m.   Wt Readings from Last 3 Encounters:  03/23/18 175 lb 5 oz (79.5 kg)  03/15/18 175 lb 11.2 oz (79.7 kg)  03/10/18 173 lb 11.6 oz (78.8 kg)      ECOG FS:2 GENERAL: Patient is a well appearing female in no acute distress HEENT:  Sclerae anicteric.   Oropharynx clear and moist. No ulcerations or evidence of oropharyngeal candidiasis. Neck is supple.  NODES:  No cervical, supraclavicular, or axillary lymphadenopathy palpated.  BREAST EXAM:  Unable to palpate definite breast mass LUNGS:  Clear to auscultation bilaterally.  No wheezes or rhonchi. HEART:  Regular rate and rhythm. No murmur appreciated. ABDOMEN:  Soft, nontender.  Positive, normoactive bowel sounds. No organomegaly palpated. MSK:  No focal spinal tenderness to palpation. Full range of motion bilaterally in the upper extremities. EXTREMITIES:  No peripheral edema.   SKIN:  Clear with no obvious rashes or skin changes. No nail dyscrasia. NEURO:  Nonfocal. Well oriented.  Appropriate affect.     LAB RESULTS:  CMP     Component Value Date/Time   NA 142 03/23/2018 1217   K 2.9 (LL) 03/23/2018 1217   CL 104 03/23/2018 1217   CO2 28 03/23/2018 1217   GLUCOSE 95 03/23/2018 1217   BUN 15 03/23/2018 1217   CREATININE 0.70 03/23/2018 1217   CALCIUM 8.9 03/23/2018 1217   PROT 6.8 03/23/2018 1217   ALBUMIN 3.6 03/23/2018 1217   AST 11 (L) 03/23/2018 1217   ALT 21 03/23/2018 1217   ALKPHOS 147 (H) 03/23/2018 1217   BILITOT 0.3 03/23/2018 1217   GFRNONAA >60 03/23/2018 1217   GFRAA >60 03/23/2018 1217    No results found for: TOTALPROTELP, ALBUMINELP, A1GS, A2GS, BETS, BETA2SER, GAMS, MSPIKE, SPEI  No results found for: KPAFRELGTCHN, LAMBDASER, KAPLAMBRATIO  Lab Results  Component Value Date   WBC 11.9 (H) 03/23/2018   NEUTROABS 8.9 (H) 03/23/2018   HGB 11.0 (L) 03/23/2018   HCT 35.6 (L) 03/23/2018   MCV 88.8 03/23/2018   PLT 143 (L) 03/23/2018    '@LASTCHEMISTRY'$ @  No results found for: LABCA2  No components found for: OEUMPN361  No results for input(s): INR in the last 168 hours.  No results found for: LABCA2  No results found for: WER154  No results found for: MGQ676  No results found  for: WGN562  No results found for: CA2729  No components found  for: HGQUANT  No results found for: CEA1 / No results found for: CEA1   No results found for: AFPTUMOR  No results found for: CHROMOGRNA  No results found for: PSA1  Appointment on 03/23/2018  Component Date Value Ref Range Status  . Sodium 03/23/2018 142  135 - 145 mmol/L Final  . Potassium 03/23/2018 2.9* 3.5 - 5.1 mmol/L Final  . Chloride 03/23/2018 104  98 - 111 mmol/L Final  . CO2 03/23/2018 28  22 - 32 mmol/L Final  . Glucose, Bld 03/23/2018 95  70 - 99 mg/dL Final  . BUN 03/23/2018 15  8 - 23 mg/dL Final  . Creatinine 03/23/2018 0.70  0.44 - 1.00 mg/dL Final  . Calcium 03/23/2018 8.9  8.9 - 10.3 mg/dL Final  . Total Protein 03/23/2018 6.8  6.5 - 8.1 g/dL Final  . Albumin 03/23/2018 3.6  3.5 - 5.0 g/dL Final  . AST 03/23/2018 11* 15 - 41 U/L Final  . ALT 03/23/2018 21  0 - 44 U/L Final  . Alkaline Phosphatase 03/23/2018 147* 38 - 126 U/L Final  . Total Bilirubin 03/23/2018 0.3  0.3 - 1.2 mg/dL Final  . GFR, Est Non Af Am 03/23/2018 >60  >60 mL/min Final  . GFR, Est AFR Am 03/23/2018 >60  >60 mL/min Final  . Anion gap 03/23/2018 10  5 - 15 Final   Performed at [facility], [place] [address] [place], [place] [address]  . WBC Count 03/23/2018 11.9* 4.0 - 10.5 K/uL Final  . RBC 03/23/2018 4.01  3.87 - 5.11 MIL/uL Final  . Hemoglobin 03/23/2018 11.0* 12.0 - 15.0 g/dL Final  . HCT 03/23/2018 35.6* 36.0 - 46.0 % Final  . MCV 03/23/2018 88.8  80.0 - 100.0 fL Final  . MCH 03/23/2018 27.4  26.0 - 34.0 pg Final  . MCHC 03/23/2018 30.9  30.0 - 36.0 g/dL Final  . RDW 03/23/2018 12.8  11.5 - 15.5 % Final  . Platelet Count 03/23/2018 143* 150 - 400 K/uL Final  . nRBC 03/23/2018 0.0  0.0 - 0.2 % Final  . Neutrophils Relative % 03/23/2018 74  % Final  . Neutro Abs 03/23/2018 8.9* 1.7 - 7.7 K/uL Final  . Lymphocytes Relative 03/23/2018 16  % Final   atyp lymphs seen  . Lymphs Abs 03/23/2018 1.9  0.7 - 4.0 K/uL Final  . Monocytes Relative 03/23/2018 2  % Final    . Monocytes Absolute 03/23/2018 0.2  0.1 - 1.0 K/uL Final  . Eosinophils Relative 03/23/2018 5  % Final  . Eosinophils Absolute 03/23/2018 0.6* 0.0 - 0.5 K/uL Final  . Basophils Relative 03/23/2018 1  % Final  . Basophils Absolute 03/23/2018 0.1  0.0 - 0.1 K/uL Final  . Immature Granulocytes 03/23/2018 2  % Final  . Abs Immature Granulocytes 03/23/2018 0.21* 0.00 - 0.07 K/uL Final  . Smudge Cells 03/23/2018 PRESENT   Final   Performed at [facility], [place] [address], [place], [place] [address]    (this displays the last labs from the last 3 days)  No results found for: TOTALPROTELP, ALBUMINELP, A1GS, A2GS, BETS, BETA2SER, GAMS, MSPIKE, SPEI (this displays SPEP labs)  No results found for: KPAFRELGTCHN, LAMBDASER, KAPLAMBRATIO (kappa/lambda light chains)  No results found for: HGBA, HGBA2QUANT, HGBFQUANT, HGBSQUAN (Hemoglobinopathy evaluation)   No results found for: LDH  No results found for: IRON, TIBC, IRONPCTSAT (Iron and TIBC)  No results found for: FERRITIN  Urinalysis    Component Value Date/Time   COLORURINE BROWN (A) 07/17/2011 1722   APPEARANCEUR TURBID (A) 07/17/2011 1722   LABSPEC 1.025 07/07/2012 1159   PHURINE 6.5 07/07/2012 1159   GLUCOSEU 100 (A) 07/07/2012 1159   HGBUR LARGE (A) 07/07/2012 1159   BILIRUBINUR SMALL (A) 07/07/2012 1159   KETONESUR NEGATIVE 07/07/2012 1159   PROTEINUR 100 (A) 07/07/2012 1159   UROBILINOGEN 0.2 07/07/2012 1159   NITRITE POSITIVE (A) 07/07/2012 1159   LEUKOCYTESUR NEGATIVE 07/07/2012 1159     STUDIES: Mr Breast Bilateral W Wo Contrast Inc Cad  Result Date: 03/11/2018 CLINICAL DATA:  68 year old patient with recent diagnosis of invasive mammary carcinoma of the left breast at 2 separate biopsy sites, including a mass at 2 o'clock position and a mass at 3:30 position presents for bilateral breast MRI. She has a remote history of a left breast excisional biopsy, upper outer quadrant. LABS:  None  obtained EXAM: BILATERAL BREAST MRI WITH AND WITHOUT CONTRAST TECHNIQUE: Multiplanar, multisequence MR images of both breasts were obtained prior to and following the intravenous administration of 8 ml of Gadavist Three-dimensional MR images were rendered by post-processing of the original MR data on an independent workstation. The three-dimensional MR images were interpreted, and findings are reported in the following complete MRI report for this study. Three dimensional images were evaluated at the independent DynaCad workstation COMPARISON:  Previous exam(s). FINDINGS: Breast composition: c. Heterogeneous fibroglandular tissue. Background parenchymal enhancement: Moderate. Right breast: Scattered benign intramammary lymph nodes and scattered foci of enhancement. In the anterior third of the medial right breast, near the level of the nipple, is a slightly irregular enhancing mass with washout kinetics measuring 0.5 x 0.4 cm. While this could be a benign intramammary lymph node, given its medial position and possible slight irregularity in shape, further evaluation is recommended. No mammographic correlate is identified. Left breast: There is signal void artifact related to a biopsy clip and/or biopsy changes in the posterior third of the upper outer left breast at the site the biopsied mass at 2 o'clock position, with a coil shaped biopsy clip placed. This mass measures approximately 1.1 x 0.8 cm on MRI. In the lower outer quadrant of the left breast is an irregular enhancing mass measuring approximately 0.9 x 0.8 cm. This is felt to be this second mass that was biopsied in the 3:30 position of the left breast. Possible biopsy clip artifact on T2 weighted image, adjacent to the small mass. Biopsy clip artifact related to the known ribbon shaped biopsy clip in the left breast is not very distinct on the MRI images. Scattered benign intramammary lymph nodes in the left breast, with fatty hila. Lymph nodes: No  abnormal appearing axillary or internal mammary chain lymph nodes. Ancillary findings: Port-A-Cath in the far superior inner left breast. IMPRESSION: Biopsy-proven multicentric malignancy in the left breast, with malignancies diagnosed at 2 o'clock position and 3:30 position. Small irregular masses are seen at the suspected biopsy sites, measuring 1.1 and 0.9 cm, at 2 o'clock and 3:30 positions, respectively. No additional suspicious areas in the left breast. 0.5 cm slightly irregular enhancing mass with washout kinetics in the anterior third of the medial right breast, 3 o'clock region. Malignancy can not be excluded. RECOMMENDATION: Second-look ultrasound of the right breast is recommended. If a suspicious or indeterminate correlate to the MRI detected mass is identified, ultrasound-guided core needle biopsy can be performed. If a definite benign intramammary lymph node is detected  and correlates with the finding on MRI, biopsy may not be necessary. If no correlate is seen on ultrasound, then MRI guided right breast biopsy can be performed. BI-RADS CATEGORY  4: Suspicious. Electronically Signed   By: Curlene Dolphin M.D.   On: 03/11/2018 15:47   Dg Chest Port 1 View  Result Date: 03/10/2018 CLINICAL DATA:  Port-A-Cath insertion. EXAM: PORTABLE CHEST 1 VIEW COMPARISON:  06/20/2010 FINDINGS: Power port in place. The tip is at the cavoatrial junction in good position. Heart size and vascularity are normal. Lungs are clear. No bone abnormality. IMPRESSION: Port-A-Cath in good position.  No acute abnormalities. Electronically Signed   By: Lorriane Shire M.D.   On: 03/10/2018 16:05   Dg Fluoro Guide Cv Line-no Report  Result Date: 03/10/2018 Fluoroscopy was utilized by the requesting physician.  No radiographic interpretation.   US Breast Ltd Uni Right Inc Axilla  Result Date: 03/16/2018 CLINICAL DATA:  5 mm slightly irregular enhancing mass with washout kinetics in the anterior 3rd of the medial right breast  in the 3 o'clock region. Recently diagnosed left breast cancer. EXAM: ULTRASOUND OF THE RIGHT BREAST COMPARISON:  Previous examinations, including the bilateral breast MRI dated 03/11/2018. FINDINGS: On physical exam, no mass is palpable in the medial right breast or right axilla. Targeted ultrasound is performed, showing a 5 x 4 x 3 mm mildly irregular hypoechoic mass with low-level internal echoes in the 3 o'clock position of the right breast, 2 cm from the nipple. The ultrasound images are incorrectly labeled 9 o'clock. No internal blood flow was seen with power Doppler. Ultrasound of the right axilla demonstrated normal appearing right axillary lymph nodes. IMPRESSION: 5 mm indeterminate mass in the 3 o'clock position of the right breast, corresponding to the 5 mm indeterminate mass seen on the MRI. RECOMMENDATION: Ultrasound-guided core needle biopsy of the 5 mm mass in the 3 o'clock position of the right breast. This has been discussed with the patient and is scheduled to follow. I have discussed the findings and recommendations with the patient. Results were also provided in writing at the conclusion of the visit. If applicable, a reminder letter will be sent to the patient regarding the next appointment. BI-RADS CATEGORY  4: Suspicious. Electronically Signed   By: Claudie Revering M.D.   On: 03/16/2018 09:54   Mm Clip Placement Right  Addendum Date: 03/16/2018   ADDENDUM REPORT: 03/16/2018 17:12 ADDENDUM: The clinical data should read as follows: Status post ultrasound-guided core needle biopsy of a 5 mm mass in the 3 o'clock position of the right breast. Electronically Signed   By: Claudie Revering M.D.   On: 03/16/2018 17:12   Result Date: 03/16/2018 CLINICAL DATA:  Status post MR guided core needle biopsy of a 5 mm mass in the 3 o'clock position of the right breast. EXAM: DIAGNOSTIC RIGHT MAMMOGRAM POST ULTRASOUND BIOPSY COMPARISON:  Previous exam(s). FINDINGS: Mammographic images were obtained following  ultrasound guided biopsy of a 5 mm mass in the 3 o'clock position of the right breast. These demonstrate a ribbon shaped biopsy marker clip at the expected location of the biopsied mass. IMPRESSION: Appropriate clip deployment following right breast ultrasound-guided core needle biopsy. Final Assessment: Post Procedure Mammograms for Marker Placement Electronically Signed: By: Claudie Revering M.D. 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 This was found to be concordant by Dr. Claudie Revering, with excision recommended. Pathology results were discussed with the patient by telephone. The patient reported doing well after the biopsy with tenderness at the site. Post biopsy instructions and care were reviewed and questions were answered. The patient was encouraged to call [facility] for any additional concerns. The patient has a recent diagnosis of LEFT breast cancer and should follow her outlined treatment plan. Dr. Stark Klein was notified of biopsy results via EPIC message on March 18, 2018. Pathology results reported by Terie Purser, RN on 03/18/2018. Electronically Signed   By: Claudie Revering M.D.   On: 03/18/2018 06:48   Result Date: 03/18/2018 CLINICAL DATA:  5 mm indeterminate mass in the 3 o'clock position of the right breast at recent MRI and ultrasound. Recently diagnosed left breast cancer. EXAM: ULTRASOUND GUIDED RIGHT BREAST CORE NEEDLE BIOPSY COMPARISON:  Previous exam(s). FINDINGS: I met with the patient and we discussed the procedure of ultrasound-guided biopsy, including benefits and alternatives. We discussed the high likelihood of a successful procedure. We discussed the risks of the procedure, including infection, bleeding, tissue injury, clip migration, and inadequate sampling. Informed  written consent was given. The usual time-out protocol was performed immediately prior to the procedure. Using sterile technique and 1% Lidocaine as local anesthetic, under direct ultrasound visualization, a 12 gauge spring-loaded device was used to perform biopsy of the recently demonstrated 5 mm mass in the 3 o'clock position of the right breast using a caudal approach. At the conclusion of the procedure a ribbon shaped tissue marker clip was deployed into the biopsy cavity. Follow up 2 view mammogram was performed and dictated separately. IMPRESSION: Ultrasound guided biopsy of the recently demonstrated 5 mm mass in the 3 o'clock position of the right breast. No apparent complications. Electronically Signed: By: Claudie Revering M.D. On: 03/16/2018 09:55    ELIGIBLE FOR AVAILABLE RESEARCH PROTOCOL: Upbeat  ASSESSMENT: 68 y.o. 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, grade 2, estrogen and progesterone receptor positive, HER-2 not amplified, with an MIB-1 of 5%  (b) in the lower outer quadrant a clinical T1c N0, stage IA-B invasive ductal carcinoma, grade 2, estrogen receptor only moderately positive at 10%, progesterone receptor negative, with an MIB-1 of 40%, and HER-2 not amplified  (1) neoadjuvant chemotherapy will consist of doxorubicin and cyclophosphamide in dose dense fashion x4 starting 03/16/2018, followed by paclitaxel and carboplatin weekly x12  (a)echo on 03/11/2018 shows well preserved EF of 60-65%  (2) definitive surgery to follow  (3) adjuvant radiation as appropriate  (4) antiestrogens to follow at the completion of local treatment (for upper outer quadrant tumor)  (5) genetics testing pending  PLAN:  Emily Chambers is doing "ok" today.  She met with Dr. Jana Hakim as well, so he could evaluate also how well she tolerated chemotherapy.  Her labs are stable.  She  is not neutropenic or at increased risk for infection.    Emily Chambers was recommended to start Omeprazole the day before chemotherapy and will then take it every day the week of chemotherapy.  She was also recommended to continue taking stool softeners.  This will hopefully improve how she tolerates the treatment.  Emily Chambers will also receive IV fluids today.  I also sent in a potassium supplement for her to take daily x 5 days for her.  Her headaches could have an element of dehydration with them, so hopefully they will improve.     Emily Chambers will return in one week for labs, f/u, and cycle two of Doxorubicin and Cyclophsophamide.  She will call with any problems that may develop before her next visit here.   Scot Dock, NP   03/23/2018 1:10 PM Medical Oncology and Hematology [facility] [address] [place], [place] [address] Tel. [phone]    Fax. [phone]   ADDENDUM: Emily Chambers tolerated her first cycle of regular fosfomycin doxorubicin remarkably well.  She is having mild though persistent nausea.  I do not think this is going to be due to the chemotherapy itself and it is a little early for her to have associative nausea.  I think from her description, which involves the nausea feeling better when she eats, which she may be having is some gastritis and/or reflux.  We are going to start her on omeprazole 40 mg daily and I think this will be helpful.  Even though she had a headache after chemo she still has intermittent headaches, which are not constant or severe, and therefore not likely to be secondary to metastases.  I also do not think they are going to be due to the palonosetron.  I am making no changes in her premeds accordingly  I think she will benefit from starting stool softeners at least 2 daily, preferably to twice a day on the day of chemo and continuing for 5 days  She seems surprised to learn that she will be losing her hair in the next 10 to 15 days.  I prepared  her for this as best I could.  I personally saw this patient and performed a substantive portion of this encounter with the listed APP documented above.   Chauncey Cruel, MD Medical Oncology and Hematology [facility] [address] [place], [place] [address] Tel. [phone]    Fax. [phone]

## 2018-03-24 ENCOUNTER — Encounter: Payer: Self-pay | Admitting: Licensed Clinical Social Worker

## 2018-03-24 ENCOUNTER — Telehealth: Payer: Self-pay | Admitting: Licensed Clinical Social Worker

## 2018-03-24 ENCOUNTER — Telehealth: Payer: Self-pay | Admitting: *Deleted

## 2018-03-24 ENCOUNTER — Ambulatory Visit: Payer: Self-pay | Admitting: Licensed Clinical Social Worker

## 2018-03-24 DIAGNOSIS — Z1379 Encounter for other screening for genetic and chromosomal anomalies: Secondary | ICD-10-CM

## 2018-03-24 NOTE — Telephone Encounter (Signed)
Revealed negative genetic testing. We discussed that we do not know why she has breast cancer or why there is cancer in the family. It could be due to a different gene that we are not testing, or something our current technology cannot pick up.  It will be important for her to keep in contact with genetics to learn if additional testing may be needed in the future. Her paternal relatives could have genetic counseling and testing, and potentially maternal relatives too if more is found out about the unknown cancers on that side.

## 2018-03-24 NOTE — Progress Notes (Signed)
HPI:  [name] was previously seen in the [place] clinic on 03/08/2018 due to a personal and family history of cancer and concerns regarding a hereditary predisposition to cancer. Please refer to our prior cancer genetics clinic note for more information regarding [name] medical, social and family histories, and our assessment and recommendations, at the time. [name]'s recent genetic test results were disclosed to her, as well as recommendations warranted by these results. These results and recommendations are discussed in more detail below.  CANCER HISTORY:    Malignant neoplasm of lower-outer quadrant of left breast of female, estrogen receptor negative ([place])   02/26/2018 Initial Diagnosis    Malignant neoplasm of lower-outer quadrant of left breast of female, estrogen receptor negative ([place])    03/17/2018 -  Chemotherapy    The patient had DOXOrubicin (ADRIAMYCIN) chemo injection 112 mg, 60 mg/m2 = 112 mg, Intravenous,  Once, 1 of 4 cycles Administration: 112 mg (03/17/2018) palonosetron (ALOXI) injection 0.25 mg, 0.25 mg, Intravenous,  Once, 1 of 8 cycles Administration: 0.25 mg (03/17/2018) pegfilgrastim-cbqv (UDENYCA) injection 6 mg, 6 mg, Subcutaneous, Once, 1 of 4 cycles Administration: 6 mg (03/19/2018) CARBOplatin (PARAPLATIN) in sodium chloride 0.9 % 100 mL chemo infusion, , Intravenous,  Once, 0 of 4 cycles cyclophosphamide (CYTOXAN) 1,120 mg in sodium chloride 0.9 % 250 mL chemo infusion, 600 mg/m2 = 1,120 mg, Intravenous,  Once, 1 of 4 cycles Administration: 1,120 mg (03/17/2018) PACLitaxel (TAXOL) 150 mg in sodium chloride 0.9 % 250 mL chemo infusion (</= 27m/m2), 80 mg/m2, Intravenous,  Once, 0 of 4 cycles fosaprepitant (EMEND) 150 mg, dexamethasone (DECADRON) 12 mg in sodium chloride 0.9 % 145 mL IVPB, , Intravenous,  Once, 1 of 8 cycles Administration:  (03/17/2018)  for chemotherapy treatment.      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 [place]   02/26/2018 Initial Diagnosis    Malignant ne[place] of upper-outer quadrant of left [place] in [place], [place] positive [place]    03/17/2018 -  Chemotherapy    The patient had DOXOrubicin (ADRIAMYCIN) chemo injection 112 mg, 60 mg/m2 = 112 mg, Intravenous,  Once, 1 of 4 cycles Administration: 112 mg (03/17/2018) palonosetron (ALOXI) injection 0.25 mg, 0.25 mg, Intravenous,  Once, 1 of 8 cycles Administration: 0.25 mg (03/17/2018) pegfilgrastim-cbqv (UDENYCA) injection 6 mg, 6 mg, Subcutaneous, Once, 1 of 4 cycles Administration: 6 mg (03/19/2018) CARBOplatin (PARAPLATIN) in sodium chloride 0.9 % 100 mL chemo infusion, , Intravenous,  Once, 0 of 4 cycles cyclophosphamide (CYTOXAN) 1,120 mg in sodium chloride 0.9 % 250 mL chemo infusion, 600 mg/m2 = 1,120 mg, Intravenous,  Once, 1 of 4 cycles Administration: 1,120 mg (03/17/2018) PACLitaxel (TAXOL) 150 mg in sodium chloride 0.9 % 250 mL chemo infusion (</= 849mm2), 80 mg/m2, Intravenous,  Once, 0 of 4 cycles fosaprepitant (EMEND) 150 mg, dexamethasone (DECADRON) 12 mg in sodium chloride 0.9 % 145 mL IVPB, , Intravenous,  Once, 1 of 8 cycles Administration:  (03/17/2018)  for chemotherapy treatment.       FAMILY HISTORY:  We obtained a detailed, 4-generation family history.  Significant diagnoses are listed below: Family History  Problem Relation Age of Onset  . Hypertension Mother   . Heart disease Mother   . Dementia Mother   . Healthy Father   . Colon cancer Maternal Grandmother   . Cancer Paternal Grandmother        unk type  . Lung cancer Cousin  90s  . Breast cancer Cousin        67s  . Breast cancer Cousin        55s  . Breast cancer Cousin   . Cancer Paternal Aunt        unk type d. 64, possibly pancreatic  . Cancer Paternal Aunt        unk type d. 42s  . Cancer Paternal Uncle        unk type   . Cancer Paternal Uncle        unk type d. 45s  . Cancer  Maternal Aunt        unk type, d. 47s  . Throat cancer Maternal Uncle        d. 11s  . Ovarian cancer Neg Hx    [name] does not have children. She has three half siblings through her mother. A maternal half-sister died at 96, no cancers. Another maternal half-sister is living at 20 and her maternal half-brother passed away at 40, no cancers. She also has three half siblings through her dad. Her two half brothers through her dad are living in their 81s, no cancer history and her half sister is living in her 58s.   [name]'s father is living at 30. He had three brothers and two sisters.  Two of his brothers and both of his sisters did have cancer, but the patient is unsure of what type. One of the patient's aunts died at 96, and it was possibly pancreatic. The other aunt died in her 35's. An uncle with cancer is still living at 54, and her other uncle with cancer died in his 54's. The patient's aunt who possibly had pancreatic cancer had 4 children, and two of her daughters (patient's cousins) had breast cancer: one in her 31's and is still living and the other in her 62's and is deceased. The patient's uncle who had cancer but is still living also had a daughter with breast cancer, she was diagnosed in her 66s or 63s and is still living. This uncle also had a son who passed away from cancer in his 69's but patient doesn't know what type. [name]'s paternal grandfather died "young," she did not know him, and paternal grandmother died in her 36's and possibly had cancer.   [name]'s mother had 11 siblings. One of her sisters died in her late 10's of cancer, but the patient doesn't know what type.This sister had a son who died in his 29's of cancer, but patient doesn't know what type. One of the patient's maternal uncles also had cancer, she believes it was throat cancer, and he died in his 35's. Another maternal cousin had cancer, she believes it was lung cancer and he died in his 20's. The  patient's maternal grandfather died in his 9s and maternal grandmother did have cancer, but patient is unsure of the type, and died in her 36s.   [name] is unaware of previous family history of genetic testing for hereditary cancer risks. Patient's ancestors are of [place], Caucasian, and American [place] descent.There is no reported Ashkenazi Jewish ancestry. There is no known consanguinity.  GENETIC TEST RESULTS: Genetic testing performed through [facility]'s Common Hereditary Cancers Panel reported out on 03/24/2018 showed no pathogenic mutations.   The Common Hereditary Cancers Panel offered by [facility] includes sequencing and/or deletion duplication testing of the following 47 genes: APC, ATM, AXIN2, BARD1, BMPR1A, BRCA1, BRCA2, BRIP1, CDH1, CDKN2A (p14ARF), CDKN2A (p16INK4a), CKD4, CHEK2,  CTNNA1, DICER1, EPCAM (Deletion/duplication testing only), GREM1 (promoter region deletion/duplication testing only), KIT, MEN1, MLH1, MSH2, MSH3, MSH6, MUTYH, NBN, NF1, NHTL1, PALB2, PDGFRA, PMS2, POLD1, POLE, PTEN, RAD50, RAD51C, RAD51D, SDHB, SDHC, SDHD, SMAD4, SMARCA4. STK11, TP53, TSC1, TSC2, and VHL.  The following genes were evaluated for sequence changes only: SDHA and HOXB13 c.251G>A variant only.  The test report will be scanned into EPIC and will be located under the Molecular Pathology section of the Results Review tab. A portion of the result report is included below for reference.     We discussed with [name] that because current genetic testing is not perfect, it is possible there may be a gene mutation in one of these genes that current testing cannot detect, but that chance is small.  We also discussed, that there could be another gene that has not yet been discovered, or that we have not yet tested, that is responsible for the cancer diagnoses in the family. It is also possible there is a hereditary cause for the cancer in the family that [name] did not inherit and therefore  was not identified in her testing.  Therefore, it is important to remain in touch with cancer genetics in the future so that we can continue to offer [name] the most up to date genetic testing.   ADDITIONAL GENETIC TESTING: We discussed with [name] that her genetic testing was fairly extensive.  If there are are genes identified to increase cancer risk that can be analyzed in the future, we would be happy to discuss and coordinate this testing at that time.    CANCER SCREENING RECOMMENDATIONS: [name]'s test result is considered negative (normal).  This means that we have not identified a hereditary cause for her personal and family history of cancer at this time.   This normal indicates that it is unlikely [name] has an increased risk for a future cancer due to a mutation in one of these genes. While reassuring, this does not definitively rule out a hereditary predisposition to cancer. It is still possible that there could be genetic mutations that are undetectable by current technology, or genetic mutations in genes that have not been tested or identified to increase cancer risk.  Therefore, it is recommended she continue to follow the cancer management and screening guidelines provided by her oncology and primary healthcare provider. An individual's cancer risk is not determined by genetic test results alone.  Overall cancer risk assessment includes additional factors such as personal medical history, family history, etc.  These should be used to make a personalized plan for cancer prevention and surveillance.    RECOMMENDATIONS FOR FAMILY MEMBERS:  Relatives in this family might be at some increased risk of developing cancer, over the general population risk, simply due to the family history of cancer.  We recommended women in this family have a yearly mammogram beginning at age 67, or 87 years younger than the earliest onset of cancer, an annual clinical breast exam, and perform  monthly breast self-exams. Women in this family should also have a gynecological exam as recommended by their primary provider. All family members should have a colonoscopy as directed by their doctors.  All family members should inform their physicians about the family history of cancer so their doctors can make the most appropriate screening recommendations for them.   It is also possible there is a hereditary cause for the cancer in Ms. Doebler's family that she did not inherit and therefore was  not identified in her.  We recommended her paternal relatives, and potentially maternal relatives if some of the unknown cancers are clarified, have genetic counseling and testing. Ms. Wickliffe will let us know if we can be of any assistance in coordinating genetic counseling and/or testing for these family members.   FOLLOW-UP: Lastly, we discussed with Ms. Blades that cancer genetics is a rapidly advancing field and it is possible that new genetic tests will be appropriate for her and/or her family members in the future. We encouraged her to remain in contact with cancer genetics on an annual basis so we can update her personal and family histories and let her know of advances in cancer genetics that may benefit this family.   Our contact number was provided. Ms. Toothaker's questions were answered to her satisfaction, and she knows she is welcome to call us at anytime with additional questions or concerns.  Faith Rogue, MS Genetic Counselor [email or web address] Phone: [phone]

## 2018-03-24 NOTE — Telephone Encounter (Signed)
Received TC from patient.  She is wanting to report some very mild hemorrhoidal bleeding due to an episode of constipation.  Bleeding very only when she wiped herself. Platelet count yesterday was 145k. Advised patient on drinking increased fluids and adding fiber to diet as well as trying miralax to help prevent future episodes of constipation.  Pt  Voiced understanding.

## 2018-03-30 ENCOUNTER — Inpatient Hospital Stay: Payer: Medicare Other

## 2018-03-30 ENCOUNTER — Telehealth: Payer: Self-pay | Admitting: Adult Health

## 2018-03-30 ENCOUNTER — Encounter: Payer: Self-pay | Admitting: Oncology

## 2018-03-30 ENCOUNTER — Inpatient Hospital Stay (HOSPITAL_BASED_OUTPATIENT_CLINIC_OR_DEPARTMENT_OTHER): Payer: Medicare Other | Admitting: Adult Health

## 2018-03-30 ENCOUNTER — Inpatient Hospital Stay: Payer: Medicare Other | Attending: Oncology

## 2018-03-30 ENCOUNTER — Encounter: Payer: Self-pay | Admitting: Adult Health

## 2018-03-30 ENCOUNTER — Encounter: Payer: Self-pay | Admitting: *Deleted

## 2018-03-30 VITALS — BP 136/86 | HR 99 | Temp 98.6°F | Resp 17 | Ht 62.0 in | Wt 177.0 lb

## 2018-03-30 DIAGNOSIS — C50412 Malignant neoplasm of upper-outer quadrant of left female breast: Secondary | ICD-10-CM

## 2018-03-30 DIAGNOSIS — Z7689 Persons encountering health services in other specified circumstances: Secondary | ICD-10-CM | POA: Diagnosis not present

## 2018-03-30 DIAGNOSIS — C50512 Malignant neoplasm of lower-outer quadrant of left female breast: Secondary | ICD-10-CM | POA: Diagnosis not present

## 2018-03-30 DIAGNOSIS — Z79899 Other long term (current) drug therapy: Secondary | ICD-10-CM

## 2018-03-30 DIAGNOSIS — R110 Nausea: Secondary | ICD-10-CM | POA: Diagnosis not present

## 2018-03-30 DIAGNOSIS — Z170 Estrogen receptor positive status [ER+]: Secondary | ICD-10-CM

## 2018-03-30 DIAGNOSIS — Z5111 Encounter for antineoplastic chemotherapy: Secondary | ICD-10-CM | POA: Diagnosis present

## 2018-03-30 DIAGNOSIS — Z171 Estrogen receptor negative status [ER-]: Secondary | ICD-10-CM

## 2018-03-30 DIAGNOSIS — E860 Dehydration: Secondary | ICD-10-CM | POA: Insufficient documentation

## 2018-03-30 DIAGNOSIS — Z95828 Presence of other vascular implants and grafts: Secondary | ICD-10-CM

## 2018-03-30 LAB — CMP (CANCER CENTER ONLY)
ALK PHOS: 130 U/L — AB (ref 38–126)
ALT: 23 U/L (ref 0–44)
AST: 13 U/L — ABNORMAL LOW (ref 15–41)
Albumin: 3.8 g/dL (ref 3.5–5.0)
Anion gap: 7 (ref 5–15)
BUN: 10 mg/dL (ref 8–23)
CO2: 29 mmol/L (ref 22–32)
Calcium: 9.5 mg/dL (ref 8.9–10.3)
Chloride: 107 mmol/L (ref 98–111)
Creatinine: 0.66 mg/dL (ref 0.44–1.00)
GFR, Est AFR Am: >60 mL/min (ref >60)
GFR, Estimated: >60 mL/min (ref >60)
Glucose, Bld: 100 mg/dL — ABNORMAL HIGH (ref 70–99)
Potassium: 4 mmol/L (ref 3.5–5.1)
Sodium: 143 mmol/L (ref 135–145)
Total Bilirubin: 0.4 mg/dL (ref 0.3–1.2)
Total Protein: 7 g/dL (ref 6.5–8.1)

## 2018-03-30 LAB — CBC WITH DIFFERENTIAL (CANCER CENTER ONLY)
Abs Immature Granulocytes: 1.35 10*3/uL — ABNORMAL HIGH (ref 0.00–0.07)
Basophils Absolute: 0.1 10*3/uL (ref 0.0–0.1)
Basophils Relative: 1 %
EOS PCT: 1 %
Eosinophils Absolute: 0.1 10*3/uL (ref 0.0–0.5)
HCT: 35.5 % — ABNORMAL LOW (ref 36.0–46.0)
Hemoglobin: 10.8 g/dL — ABNORMAL LOW (ref 12.0–15.0)
Immature Granulocytes: 13 %
Lymphocytes Relative: 19 %
Lymphs Abs: 2.1 10*3/uL (ref 0.7–4.0)
MCH: 27.6 pg (ref 26.0–34.0)
MCHC: 30.4 g/dL (ref 30.0–36.0)
MCV: 90.6 fL (ref 80.0–100.0)
Monocytes Absolute: 0.7 10*3/uL (ref 0.1–1.0)
Monocytes Relative: 6 %
Neutro Abs: 6.5 10*3/uL (ref 1.7–7.7)
Neutrophils Relative %: 60 %
Platelet Count: 111 10*3/uL — ABNORMAL LOW (ref 150–400)
RBC: 3.92 MIL/uL (ref 3.87–5.11)
RDW: 13.7 % (ref 11.5–15.5)
WBC Count: 10.8 10*3/uL — ABNORMAL HIGH (ref 4.0–10.5)
nRBC: 0.8 % — ABNORMAL HIGH (ref 0.0–0.2)

## 2018-03-30 MED ORDER — SODIUM CHLORIDE 0.9% FLUSH
10.0000 mL | INTRAVENOUS | Status: DC | PRN
  Administered 2018-03-30: 10 mL
  Filled 2018-03-30: qty 10

## 2018-03-30 MED ORDER — SODIUM CHLORIDE 0.9% FLUSH
10.0000 mL | Freq: Once | INTRAVENOUS | Status: AC
  Administered 2018-03-30: 10 mL
  Filled 2018-03-30: qty 10

## 2018-03-30 MED ORDER — SODIUM CHLORIDE 0.9 % IV SOLN
Freq: Once | INTRAVENOUS | Status: AC
  Administered 2018-03-30: 14:00:00 via INTRAVENOUS
  Filled 2018-03-30: qty 250

## 2018-03-30 MED ORDER — HEPARIN SOD (PORK) LOCK FLUSH 100 UNIT/ML IV SOLN
500.0000 [IU] | Freq: Once | INTRAVENOUS | Status: AC | PRN
  Administered 2018-03-30: 500 [IU]
  Filled 2018-03-30: qty 5

## 2018-03-30 MED ORDER — PALONOSETRON HCL INJECTION 0.25 MG/5ML
0.2500 mg | Freq: Once | INTRAVENOUS | Status: AC
  Administered 2018-03-30: 0.25 mg via INTRAVENOUS

## 2018-03-30 MED ORDER — DOXORUBICIN HCL CHEMO IV INJECTION 2 MG/ML
60.0000 mg/m2 | Freq: Once | INTRAVENOUS | Status: AC
  Administered 2018-03-30: 112 mg via INTRAVENOUS
  Filled 2018-03-30: qty 56

## 2018-03-30 MED ORDER — SODIUM CHLORIDE 0.9 % IV SOLN
Freq: Once | INTRAVENOUS | Status: AC
  Administered 2018-03-30: 14:00:00 via INTRAVENOUS
  Filled 2018-03-30: qty 5

## 2018-03-30 MED ORDER — SODIUM CHLORIDE 0.9 % IV SOLN
600.0000 mg/m2 | Freq: Once | INTRAVENOUS | Status: AC
  Administered 2018-03-30: 1120 mg via INTRAVENOUS
  Filled 2018-03-30: qty 56

## 2018-03-30 MED ORDER — PALONOSETRON HCL INJECTION 0.25 MG/5ML
INTRAVENOUS | Status: AC
  Filled 2018-03-30: qty 5

## 2018-03-30 MED ORDER — CYANOCOBALAMIN 1000 MCG/ML IJ SOLN
INTRAMUSCULAR | Status: AC
  Filled 2018-03-30: qty 1

## 2018-03-30 NOTE — Telephone Encounter (Signed)
No los °

## 2018-03-30 NOTE — Progress Notes (Signed)
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 She underwent bilateral diagnostic mammography with tomography and left breast ultrasonography at [facility] on 02/16/2018 showing: breast density category C; two left breast masses, one at 2 o'clock and the other at 3:30 o'clock. The 2 o'clock mass (1.3 x 1 x 1 cm) corresponds to the mammographic abnormality and is highly suspicious for breast carcinoma. The other mass (1.2 x 0.7 x 1.2 cm)  is suspicious for breast carcinoma. No left axillary adenopathy.   Accordingly on 02/19/2018 she proceeded to biopsy of the left breast area in question. 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 Prognostic indicators significant for: estrogen receptor, 90% positive and progesterone receptor, 95% positive, both with strong staining intensity. Proliferation marker Ki67 at 5%. HER2 negative (1+).   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 Prognostic indicators significant for: estrogen receptor, 10% positive with moderate staining intensity and progesterone receptor, 0% negative. Proliferation marker Ki67  at 40%. HER2 negative (1+).  The patient's subsequent history is as detailed below.   INTERVAL HISTORY: Emily Chambers is here today for follow up of her estrogen positive and estrogen negative breast cancer prior to receiving treatment with neoadjuvant chemotherapy on Wednesday, 03/17/2018.  She will be receiving 4 cycles of neoadjuvant Doxorubicin and Cyclophosphamide with Udenyca support given once every 2 weeks, followed by weekly Paclitaxel and Carboplatin x 12 weeks.  Today is cycle 2 day 1 of treatment.    REVIEW OF SYSTEMS: Emily Chambers is doing well today.  She was dehydrated last week and received IV fluids.  She is feeling better.  Her appetite is improved.  She is improving on her water intake.  She has had no further nausea.  She denies vomiting or diarrhea.  Her headaches have improved since she received IV fluids.      Emily Chambers denies any fevers or chills.  She is without chest pain, cough, palpitations, shortness of breath.  She denies bowel/bladder issues, abdominal pain, dysphagia, or oral concerns.  A detailed ROS was otherwise non contributory.     PAST MEDICAL HISTORY: Past Medical History:  Diagnosis Date  . Family history of breast cancer   . Family history of lung cancer   . Family history of throat cancer   . High cholesterol   . Hypertension   . Kidney stone   . Kidney stones   . UTI (lower urinary tract infection)     PAST SURGICAL HISTORY: Past Surgical History:  Procedure Laterality Date  . BREAST EXCISIONAL BIOPSY Left 1999,2005   cysts removed  . BREAST SURGERY Left    cyst and biopsy  . PORTACATH PLACEMENT Left 03/10/2018   Procedure: INSERTION PORT-A-CATH;  Surgeon: Stark Klein, MD;  Location: [facility];  Service: General;  Laterality:  Left;    FAMILY HISTORY Family History  Problem Relation Age of Onset  . Hypertension Mother   . Heart disease Mother   . Dementia Mother   . Healthy Father   . Colon cancer Maternal Grandmother     . Cancer Paternal Grandmother        unk type  . Lung cancer Cousin        23s  . Breast cancer Cousin        80s  . Breast cancer Cousin        81s  . Breast cancer Cousin   . Cancer Paternal Aunt        unk type d. 32, possibly pancreatic  . Cancer Paternal Aunt        unk type d. 75s  . Cancer Paternal Uncle        unk type   . Cancer Paternal Uncle        unk type d. 93s  . Cancer Maternal Aunt        unk type, d. 8s  . Throat cancer Maternal Uncle        d. 39s  . Ovarian cancer Neg Hx    Patient father is alive at 39 years old. Patient mother died from heart disease and dementia at age 7.  The patient denies a family hx of ovarian cancer. She has 3 siblings, 1 brother and 2 sisters. She has a maternal cousin diagnosed with lung cancer in her 52s. She has 3 paternal cousins with breast cancer, one was diagnosed in her 39s and has passed away.  GYNECOLOGIC HISTORY:  No LMP recorded. Patient is postmenopausal. Menarche: 69 years old Age at first live birth: n/a GX P 0 LMP 2010 Contraceptive n/a HRT no  Hysterectomy? no BSo? no   SOCIAL HISTORY: She is single and works as a Scientist, water quality at [employer]). She lives alone, with no pets.      ADVANCED DIRECTIVES: Not in place.  At the 03/03/2018 visit she was given the appropriate documents to complete and notarize at her discretion.  She is planning to name her niece, [name], as her HCPOA.   HEALTH MAINTENANCE: Social History   Tobacco Use  . Smoking status: Never Smoker  . Smokeless tobacco: Never Used  Substance Use Topics  . Alcohol use: Yes    Comment: seldom  . Drug use: No     Colonoscopy: 2014? [facility]  PAP: 02/02/2018  Bone density: 03/15/2016, T-score 0.4, Dr. Alden Hipp   No Known Allergies  Current Outpatient Medications  Medication Sig Dispense Refill  . amLODipine (NORVASC) 10 MG tablet Take 10 mg by mouth every morning.     Marland Kitchen aspirin 81 MG  chewable tablet Chew 81 mg by mouth daily.    Marland Kitchen dexamethasone (DECADRON) 4 MG tablet Take 2 tablets by mouth twice a day for three days starting the day after chemotherapy. Take with food. 30 tablet 1  . lidocaine-prilocaine (EMLA) cream Apply 1 application topically as needed. 30 g 0  . LORazepam (ATIVAN) 0.5 MG tablet Take 1 tablet (0.5 mg total) by mouth every 6 (six) hours as needed (Nausea or vomiting). 30 tablet 0  . losartan (COZAAR) 100 MG tablet Take 100 mg by mouth every morning.     Marland Kitchen omeprazole (PRILOSEC) 40 MG capsule Take 1 capsule (40 mg total) by mouth daily. 30 capsule 5  . oxyCODONE (OXY IR/ROXICODONE) 5 MG immediate release tablet Take  1 tablet (5 mg total) by mouth every 6 (six) hours as needed for severe pain. 10 tablet 0  . potassium chloride SA (K-DUR,KLOR-CON) 20 MEQ tablet Take 1 tablet (20 mEq total) by mouth daily. 5 tablet 0  . pravastatin (PRAVACHOL) 20 MG tablet Take 20 mg by mouth every morning.     . prochlorperazine (COMPAZINE) 10 MG tablet Take 1 tablet (10 mg total) by mouth every 6 (six) hours as needed (Nausea or vomiting). 30 tablet 1   No current facility-administered medications for this visit.     OBJECTIVE:  Vitals:   03/30/18 1251  BP: 136/86  Pulse: 99  Resp: 17  Temp: 98.6 F (37 C)  SpO2: 98%     Body mass index is 32.37 kg/m.   Wt Readings from Last 3 Encounters:  03/30/18 177 lb (80.3 kg)  03/23/18 175 lb 5 oz (79.5 kg)  03/15/18 175 lb 11.2 oz (79.7 kg)      ECOG FS:1 GENERAL: Patient is a well appearing female in no acute distress HEENT:  Sclerae anicteric.  Oropharynx clear and moist. No ulcerations or evidence of oropharyngeal candidiasis. Neck is supple.  NODES:  No cervical, supraclavicular, or axillary lymphadenopathy palpated.  BREAST EXAM:  deferred LUNGS:  Clear to auscultation bilaterally.  No wheezes or rhonchi. HEART:  Regular rate and rhythm. No murmur appreciated. ABDOMEN:  Soft, nontender.  Positive, normoactive  bowel sounds. No organomegaly palpated. MSK:  No focal spinal tenderness to palpation. Full range of motion bilaterally in the upper extremities. EXTREMITIES:  No peripheral edema.   SKIN:  Clear with no obvious rashes or skin changes. No nail dyscrasia. NEURO:  Nonfocal. Well oriented.  Appropriate affect.     LAB RESULTS:  CMP     Component Value Date/Time   NA 142 03/23/2018 1217   K 2.9 (LL) 03/23/2018 1217   CL 104 03/23/2018 1217   CO2 28 03/23/2018 1217   GLUCOSE 95 03/23/2018 1217   BUN 15 03/23/2018 1217   CREATININE 0.70 03/23/2018 1217   CALCIUM 8.9 03/23/2018 1217   PROT 6.8 03/23/2018 1217   ALBUMIN 3.6 03/23/2018 1217   AST 11 (L) 03/23/2018 1217   ALT 21 03/23/2018 1217   ALKPHOS 147 (H) 03/23/2018 1217   BILITOT 0.3 03/23/2018 1217   GFRNONAA >60 03/23/2018 1217   GFRAA >60 03/23/2018 1217    No results found for: TOTALPROTELP, ALBUMINELP, A1GS, A2GS, BETS, BETA2SER, GAMS, MSPIKE, SPEI  No results found for: KPAFRELGTCHN, LAMBDASER, KAPLAMBRATIO  Lab Results  Component Value Date   WBC 10.8 (H) 03/30/2018   NEUTROABS 6.5 03/30/2018   HGB 10.8 (L) 03/30/2018   HCT 35.5 (L) 03/30/2018   MCV 90.6 03/30/2018   PLT 111 (L) 03/30/2018    '@LASTCHEMISTRY'$ @  No results found for: LABCA2  No components found for: [id number]  No results for input(s): INR in the last 168 hours.  No results found for: LABCA2  No results found for: GYB638  No results found for: LHT342  No results found for: AJG811  No results found for: CA2729  No components found for: HGQUANT  No results found for: CEA1 / No results found for: CEA1   No results found for: AFPTUMOR  No results found for: CHROMOGRNA  No results found for: PSA1  Appointment on 03/30/2018  Component Date Value Ref Range Status  . WBC Count 03/30/2018 10.8* 4.0 - 10.5 K/uL Final  . RBC 03/30/2018 3.92  3.87 - 5.11 MIL/uL Final  .  Hemoglobin 03/30/2018 10.8* 12.0 - 15.0 g/dL Final  . HCT  03/30/2018 35.5* 36.0 - 46.0 % Final  . MCV 03/30/2018 90.6  80.0 - 100.0 fL Final  . MCH 03/30/2018 27.6  26.0 - 34.0 pg Final  . MCHC 03/30/2018 30.4  30.0 - 36.0 g/dL Final  . RDW 03/30/2018 13.7  11.5 - 15.5 % Final  . Platelet Count 03/30/2018 111* 150 - 400 K/uL Final  . nRBC 03/30/2018 0.8* 0.0 - 0.2 % Final  . Neutrophils Relative % 03/30/2018 60  % Final  . Neutro Abs 03/30/2018 6.5  1.7 - 7.7 K/uL Final  . Lymphocytes Relative 03/30/2018 19  % Final  . Lymphs Abs 03/30/2018 2.1  0.7 - 4.0 K/uL Final  . Monocytes Relative 03/30/2018 6  % Final  . Monocytes Absolute 03/30/2018 0.7  0.1 - 1.0 K/uL Final  . Eosinophils Relative 03/30/2018 1  % Final  . Eosinophils Absolute 03/30/2018 0.1  0.0 - 0.5 K/uL Final  . Basophils Relative 03/30/2018 1  % Final  . Basophils Absolute 03/30/2018 0.1  0.0 - 0.1 K/uL Final  . Immature Granulocytes 03/30/2018 13  % Final   Increased IG's, likely caused by Bone Marrow Colony Stimulating Factor received within 30 days.  . Abs Immature Granulocytes 03/30/2018 1.35* 0.00 - 0.07 K/uL Final  . Polychromasia 03/30/2018 PRESENT   Final  . Ovalocytes 03/30/2018 PRESENT   Final   Performed at [facility], [address] [place], [place] [address]    (this displays the last labs from the last 3 days)  No results found for: TOTALPROTELP, ALBUMINELP, A1GS, A2GS, BETS, BETA2SER, GAMS, MSPIKE, SPEI (this displays SPEP labs)  No results found for: KPAFRELGTCHN, LAMBDASER, KAPLAMBRATIO (kappa/lambda light chains)  No results found for: HGBA, HGBA2QUANT, HGBFQUANT, HGBSQUAN (Hemoglobinopathy evaluation)   No results found for: LDH  No results found for: IRON, TIBC, IRONPCTSAT (Iron and TIBC)  No results found for: FERRITIN  Urinalysis    Component Value Date/Time   COLORURINE BROWN (A) 07/17/2011 1722   APPEARANCEUR TURBID (A) 07/17/2011 1722   LABSPEC 1.025 07/07/2012 1159   PHURINE 6.5 07/07/2012 1159   GLUCOSEU  100 (A) 07/07/2012 1159   HGBUR LARGE (A) 07/07/2012 1159   BILIRUBINUR SMALL (A) 07/07/2012 1159   KETONESUR NEGATIVE 07/07/2012 1159   PROTEINUR 100 (A) 07/07/2012 1159   UROBILINOGEN 0.2 07/07/2012 1159   NITRITE POSITIVE (A) 07/07/2012 1159   LEUKOCYTESUR NEGATIVE 07/07/2012 1159     STUDIES: Mr Breast Bilateral W Wo Contrast Inc Cad  Result Date: 03/11/2018 CLINICAL DATA:  68 year old patient with recent diagnosis of invasive mammary carcinoma of the left breast at 2 separate biopsy sites, including a mass at 2 o'clock position and a mass at 3:30 position presents for bilateral breast MRI. She has a remote history of a left breast excisional biopsy, upper outer quadrant. LABS:  None obtained EXAM: BILATERAL BREAST MRI WITH AND WITHOUT CONTRAST TECHNIQUE: Multiplanar, multisequence MR images of both breasts were obtained prior to and following the intravenous administration of 8 ml of Gadavist Three-dimensional MR images were rendered by post-processing of the original MR data on an independent workstation. The three-dimensional MR images were interpreted, and findings are reported in the following complete MRI report for this study. Three dimensional images were evaluated at the independent DynaCad workstation COMPARISON:  Previous exam(s). FINDINGS: Breast composition: c. Heterogeneous fibroglandular tissue. Background parenchymal enhancement: Moderate. Right breast: Scattered benign intramammary lymph nodes and scattered foci of enhancement.  In the anterior third of the medial right breast, near the level of the nipple, is a slightly irregular enhancing mass with washout kinetics measuring 0.5 x 0.4 cm. While this could be a benign intramammary lymph node, given its medial position and possible slight irregularity in shape, further evaluation is recommended. No mammographic correlate is identified. Left breast: There is signal void artifact related to a biopsy clip and/or biopsy changes in the  posterior third of the upper outer left breast at the site the biopsied mass at 2 o'clock position, with a coil shaped biopsy clip placed. This mass measures approximately 1.1 x 0.8 cm on MRI. In the lower outer quadrant of the left breast is an irregular enhancing mass measuring approximately 0.9 x 0.8 cm. This is felt to be this second mass that was biopsied in the 3:30 position of the left breast. Possible biopsy clip artifact on T2 weighted image, adjacent to the small mass. Biopsy clip artifact related to the known ribbon shaped biopsy clip in the left breast is not very distinct on the MRI images. Scattered benign intramammary lymph nodes in the left breast, with fatty hila. Lymph nodes: No abnormal appearing axillary or internal mammary chain lymph nodes. Ancillary findings: Port-A-Cath in the far superior inner left breast. IMPRESSION: Biopsy-proven multicentric malignancy in the left breast, with malignancies diagnosed at 2 o'clock position and 3:30 position. Small irregular masses are seen at the suspected biopsy sites, measuring 1.1 and 0.9 cm, at 2 o'clock and 3:30 positions, respectively. No additional suspicious areas in the left breast. 0.5 cm slightly irregular enhancing mass with washout kinetics in the anterior third of the medial right breast, 3 o'clock region. Malignancy can not be excluded. RECOMMENDATION: Second-look ultrasound of the right breast is recommended. If a suspicious or indeterminate correlate to the MRI detected mass is identified, ultrasound-guided core needle biopsy can be performed. If a definite benign intramammary lymph node is detected and correlates with the finding on MRI, biopsy may not be necessary. If no correlate is seen on ultrasound, then MRI guided right breast biopsy can be performed. BI-RADS CATEGORY  4: Suspicious. Electronically Signed   By: Curlene Dolphin M.D.   On: 03/11/2018 15:47   Dg Chest Port 1 View  Result Date: 03/10/2018 CLINICAL DATA:  Port-A-Cath  insertion. EXAM: PORTABLE CHEST 1 VIEW COMPARISON:  06/20/2010 FINDINGS: Power port in place. The tip is at the cavoatrial junction in good position. Heart size and vascularity are normal. Lungs are clear. No bone abnormality. IMPRESSION: Port-A-Cath in good position.  No acute abnormalities. Electronically Signed   By: Lorriane Shire M.D.   On: 03/10/2018 16:05   Dg Fluoro Guide Cv Line-no Report  Result Date: 03/10/2018 Fluoroscopy was utilized by the requesting physician.  No radiographic interpretation.   US Breast Ltd Uni Right Inc Axilla  Result Date: 03/16/2018 CLINICAL DATA:  5 mm slightly irregular enhancing mass with washout kinetics in the anterior 3rd of the medial right breast in the 3 o'clock region. Recently diagnosed left breast cancer. EXAM: ULTRASOUND OF THE RIGHT BREAST COMPARISON:  Previous examinations, including the bilateral breast MRI dated 03/11/2018. FINDINGS: On physical exam, no mass is palpable in the medial right breast or right axilla. Targeted ultrasound is performed, showing a 5 x 4 x 3 mm mildly irregular hypoechoic mass with low-level internal echoes in the 3 o'clock position of the right breast, 2 cm from the nipple. The ultrasound images are incorrectly labeled 9 o'clock. No internal blood flow was  seen with power Doppler. Ultrasound of the right axilla demonstrated normal appearing right axillary lymph nodes. IMPRESSION: 5 mm indeterminate mass in the 3 o'clock position of the right breast, corresponding to the 5 mm indeterminate mass seen on the MRI. RECOMMENDATION: Ultrasound-guided core needle biopsy of the 5 mm mass in the 3 o'clock position of the right breast. This has been discussed with the patient and is scheduled to follow. I have discussed the findings and recommendations with the patient. Results were also provided in writing at the conclusion of the visit. If applicable, a reminder letter will be sent to the patient regarding the next appointment. BI-RADS  CATEGORY  4: Suspicious. Electronically Signed   By: Claudie Revering M.D.   On: 03/16/2018 09:54   Mm Clip Placement Right  Addendum Date: 03/16/2018   ADDENDUM REPORT: 03/16/2018 17:12 ADDENDUM: The clinical data should read as follows: Status post ultrasound-guided core needle biopsy of a 5 mm mass in the 3 o'clock position of the right breast. Electronically Signed   By: Claudie Revering M.D.   On: 03/16/2018 17:12   Result Date: 03/16/2018 CLINICAL DATA:  Status post MR guided core needle biopsy of a 5 mm mass in the 3 o'clock position of the right breast. EXAM: DIAGNOSTIC RIGHT MAMMOGRAM POST ULTRASOUND BIOPSY COMPARISON:  Previous exam(s). FINDINGS: Mammographic images were obtained following ultrasound guided biopsy of a 5 mm mass in the 3 o'clock position of the right breast. These demonstrate a ribbon shaped biopsy marker clip at the expected location of the biopsied mass. IMPRESSION: Appropriate clip deployment following right breast ultrasound-guided core needle biopsy. Final Assessment: Post Procedure Mammograms for Marker Placement Electronically Signed: By: Claudie Revering M.D. 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 This was found to be concordant by Dr. Claudie Revering, with excision recommended. Pathology results were discussed with the patient by telephone. The patient reported doing well after the biopsy with tenderness at the site. Post biopsy instructions and care were reviewed and questions were answered. The patient was encouraged to call [facility] for any additional concerns. The patient has a recent diagnosis of LEFT breast cancer and should follow her outlined treatment plan. Dr. Stark Klein was notified of biopsy results via EPIC message on March 18, 2018. Pathology results reported  by Terie Purser, RN on 03/18/2018. Electronically Signed   By: Claudie Revering M.D.   On: 03/18/2018 06:48   Result Date: 03/18/2018 CLINICAL DATA:  5 mm indeterminate mass in the 3 o'clock position of the right breast at recent MRI and ultrasound. Recently diagnosed left breast cancer. EXAM: ULTRASOUND GUIDED RIGHT BREAST CORE NEEDLE BIOPSY COMPARISON:  Previous exam(s). FINDINGS: I met with the patient and we discussed the procedure of ultrasound-guided biopsy, including benefits and alternatives. We discussed the high likelihood of a successful procedure. We discussed the risks of the procedure, including infection, bleeding, tissue injury, clip migration, and inadequate sampling. Informed written consent was given. The usual time-out protocol was performed immediately prior to the procedure. Using sterile technique and 1% Lidocaine as local anesthetic, under direct ultrasound visualization, a 12 gauge spring-loaded device was used to perform biopsy of the recently demonstrated 5 mm mass in the 3 o'clock position of the right breast using a caudal approach. At the conclusion of  the procedure a ribbon shaped tissue marker clip was deployed into the biopsy cavity. Follow up 2 view mammogram was performed and dictated separately. IMPRESSION: Ultrasound guided biopsy of the recently demonstrated 5 mm mass in the 3 o'clock position of the right breast. No apparent complications. Electronically Signed: By: Claudie Revering M.D. On: 03/16/2018 09:55    ELIGIBLE FOR AVAILABLE RESEARCH PROTOCOL: Upbeat  ASSESSMENT: 68 y.o. 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, grade 2, estrogen and progesterone receptor positive, HER-2 not amplified, with an MIB-1 of 5%  (b) in the lower outer quadrant a clinical T1c N0, stage IA-B invasive ductal carcinoma, grade 2, estrogen receptor only moderately  positive at 10%, progesterone receptor negative, with an MIB-1 of 40%, and HER-2 not amplified  (1) neoadjuvant chemotherapy will consist of doxorubicin and cyclophosphamide in dose dense fashion x4 starting 03/16/2018, followed by paclitaxel and carboplatin weekly x12  (a)echo on 03/11/2018 shows well preserved EF of 60-65%  (2) definitive surgery to follow  (3) adjuvant radiation as appropriate  (4) antiestrogens to follow at the completion of local treatment (for upper outer quadrant tumor)  (5) genetics testing on 03/24/2018 showed no pathogenic mutations.  Genes tested include:  APC, ATM, AXIN2, BARD1, BMPR1A, BRCA1, BRCA2, BRIP1, CDH1, CDKN2A (p14ARF), CDKN2A (p16INK4a), CKD4, CHEK2, CTNNA1, DICER1, EPCAM (Deletion/duplication testing only), GREM1 (promoter region deletion/duplication testing only), KIT, MEN1, MLH1, MSH2, MSH3, MSH6, MUTYH, NBN, NF1, NHTL1, PALB2, PDGFRA, PMS2, POLD1, POLE, PTEN, RAD50, RAD51C, RAD51D, SDHB, SDHC, SDHD, SMAD4, SMARCA4. STK11, TP53, TSC1, TSC2, and VHL.  The following genes were evaluated for sequence changes only: SDHA and HOXB13 c.251G>A variant only.  PLAN:  Emily Chambers is doing well today.  Her labs are stable thus far.  She will proceed with her second cycle of Doxorubicin and Cyclophosphamide (so long as her CMET is within parameters).    Emily Chambers and I reviewed her medication list, and she is going to go home and make sure she is taking the Omeprazole daily first thing in the morning on an empty stomach.  We reviewed how to take her anti nausea medications as well.    Emily Chambers will return in one week for labs and follow up. She will call with any problems that may develop before her next visit here.   A total of (20) minutes of face-to-face time was spent with this patient with greater than 50% of that time in counseling and care-coordination.    Scot Dock, NP   03/30/2018 1:11 PM Medical Oncology and Hematology [facility] [address] [place], [place] [address] Tel. [phone]    Fax. [phone]

## 2018-03-30 NOTE — Patient Instructions (Signed)
[facility] Discharge Instructions for Patients Receiving Chemotherapy  Today you received the following chemotherapy agents Doxorubicin, Cytoxan.  To help prevent nausea and vomiting after your treatment, we encourage you to take your nausea medication DO NOT TAKE ONDANSTARON (ZOFRAN) FOR THREE DAYS AFTER TREATMENT.   If you develop nausea and vomiting that is not controlled by your nausea medication, call the clinic.   BELOW ARE SYMPTOMS THAT SHOULD BE REPORTED IMMEDIATELY:  *FEVER GREATER THAN 100.5 F  *CHILLS WITH OR WITHOUT FEVER  NAUSEA AND VOMITING THAT IS NOT CONTROLLED WITH YOUR NAUSEA MEDICATION  *UNUSUAL SHORTNESS OF BREATH  *UNUSUAL BRUISING OR BLEEDING  TENDERNESS IN MOUTH AND THROAT WITH OR WITHOUT PRESENCE OF ULCERS  *URINARY PROBLEMS  *BOWEL PROBLEMS  UNUSUAL RASH Items with * indicate a potential emergency and should be followed up as soon as possible.  Feel free to call the clinic should you have any questions or concerns. The clinic phone number is [phone].  Please show the [facility] at check-in to the Emergency Department and triage nurse.

## 2018-04-01 ENCOUNTER — Inpatient Hospital Stay: Payer: Medicare Other

## 2018-04-01 DIAGNOSIS — C50412 Malignant neoplasm of upper-outer quadrant of left female breast: Secondary | ICD-10-CM

## 2018-04-01 DIAGNOSIS — C50512 Malignant neoplasm of lower-outer quadrant of left female breast: Secondary | ICD-10-CM

## 2018-04-01 DIAGNOSIS — Z5111 Encounter for antineoplastic chemotherapy: Secondary | ICD-10-CM | POA: Diagnosis not present

## 2018-04-01 DIAGNOSIS — Z170 Estrogen receptor positive status [ER+]: Secondary | ICD-10-CM

## 2018-04-01 DIAGNOSIS — Z171 Estrogen receptor negative status [ER-]: Principal | ICD-10-CM

## 2018-04-01 MED ORDER — PEGFILGRASTIM-CBQV 6 MG/0.6ML ~~LOC~~ SOSY
6.0000 mg | PREFILLED_SYRINGE | Freq: Once | SUBCUTANEOUS | Status: AC
  Administered 2018-04-01: 6 mg via SUBCUTANEOUS

## 2018-04-01 MED ORDER — PEGFILGRASTIM-CBQV 6 MG/0.6ML ~~LOC~~ SOSY
PREFILLED_SYRINGE | SUBCUTANEOUS | Status: AC
  Filled 2018-04-01: qty 0.6

## 2018-04-01 NOTE — Patient Instructions (Signed)
Pegfilgrastim injection  What is this medicine?  PEGFILGRASTIM (PEG fil gra stim) is a long-acting granulocyte colony-stimulating factor that stimulates the growth of neutrophils, a type of white blood cell important in the body's fight against infection. It is used to reduce the incidence of fever and infection in patients with certain types of cancer who are receiving chemotherapy that affects the bone marrow, and to increase survival after being exposed to high doses of radiation.  This medicine may be used for other purposes; ask your health care provider or pharmacist if you have questions.  COMMON BRAND NAME(S): Fulphila, Neulasta, UDENYCA  What should I tell my health care provider before I take this medicine?  They need to know if you have any of these conditions:  -kidney disease  -latex allergy  -ongoing radiation therapy  -sickle cell disease  -skin reactions to acrylic adhesives (On-Body Injector only)  -an unusual or allergic reaction to pegfilgrastim, filgrastim, other medicines, foods, dyes, or preservatives  -pregnant or trying to get pregnant  -breast-feeding  How should I use this medicine?  This medicine is for injection under the skin. If you get this medicine at home, you will be taught how to prepare and give the pre-filled syringe or how to use the On-body Injector. Refer to the patient Instructions for Use for detailed instructions. Use exactly as directed. Tell your healthcare provider immediately if you suspect that the On-body Injector may not have performed as intended or if you suspect the use of the On-body Injector resulted in a missed or partial dose.  It is important that you put your used needles and syringes in a special sharps container. Do not put them in a trash can. If you do not have a sharps container, call your pharmacist or healthcare provider to get one.  Talk to your pediatrician regarding the use of this medicine in children. While this drug may be prescribed for  selected conditions, precautions do apply.  Overdosage: If you think you have taken too much of this medicine contact a poison control center or emergency room at once.  NOTE: This medicine is only for you. Do not share this medicine with others.  What if I miss a dose?  It is important not to miss your dose. Call your doctor or health care professional if you miss your dose. If you miss a dose due to an On-body Injector failure or leakage, a new dose should be administered as soon as possible using a single prefilled syringe for manual use.  What may interact with this medicine?  Interactions have not been studied.  Give your health care provider a list of all the medicines, herbs, non-prescription drugs, or dietary supplements you use. Also tell them if you smoke, drink alcohol, or use illegal drugs. Some items may interact with your medicine.  This list may not describe all possible interactions. Give your health care provider a list of all the medicines, herbs, non-prescription drugs, or dietary supplements you use. Also tell them if you smoke, drink alcohol, or use illegal drugs. Some items may interact with your medicine.  What should I watch for while using this medicine?  You may need blood work done while you are taking this medicine.  If you are going to need a MRI, CT scan, or other procedure, tell your doctor that you are using this medicine (On-Body Injector only).  What side effects may I notice from receiving this medicine?  Side effects that you should report to   your doctor or health care professional as soon as possible:  -allergic reactions like skin rash, itching or hives, swelling of the face, lips, or tongue  -back pain  -dizziness  -fever  -pain, redness, or irritation at site where injected  -pinpoint red spots on the skin  -red or dark-brown urine  -shortness of breath or breathing problems  -stomach or side pain, or pain at the shoulder  -swelling  -tiredness  -trouble passing urine or  change in the amount of urine  Side effects that usually do not require medical attention (report to your doctor or health care professional if they continue or are bothersome):  -bone pain  -muscle pain  This list may not describe all possible side effects. Call your doctor for medical advice about side effects. You may report side effects to FDA at 1-800-FDA-1088.  Where should I keep my medicine?  Keep out of the reach of children.  If you are using this medicine at home, you will be instructed on how to store it. Throw away any unused medicine after the expiration date on the label.  NOTE: This sheet is a summary. It may not cover all possible information. If you have questions about this medicine, talk to your doctor, pharmacist, or health care provider.   2019 [employer] (2017-05-18 16:57:08)

## 2018-04-05 ENCOUNTER — Other Ambulatory Visit: Payer: Self-pay | Admitting: *Deleted

## 2018-04-05 DIAGNOSIS — Z171 Estrogen receptor negative status [ER-]: Principal | ICD-10-CM

## 2018-04-05 DIAGNOSIS — C50512 Malignant neoplasm of lower-outer quadrant of left female breast: Secondary | ICD-10-CM

## 2018-04-06 ENCOUNTER — Inpatient Hospital Stay: Payer: Medicare Other

## 2018-04-06 ENCOUNTER — Telehealth: Payer: Self-pay

## 2018-04-06 ENCOUNTER — Inpatient Hospital Stay (HOSPITAL_BASED_OUTPATIENT_CLINIC_OR_DEPARTMENT_OTHER): Payer: Medicare Other | Admitting: Adult Health

## 2018-04-06 ENCOUNTER — Telehealth: Payer: Self-pay | Admitting: Adult Health

## 2018-04-06 ENCOUNTER — Encounter: Payer: Self-pay | Admitting: Adult Health

## 2018-04-06 VITALS — BP 120/69 | HR 79 | Temp 98.5°F | Resp 18 | Ht 62.0 in | Wt 174.1 lb

## 2018-04-06 DIAGNOSIS — Z5111 Encounter for antineoplastic chemotherapy: Secondary | ICD-10-CM | POA: Diagnosis not present

## 2018-04-06 DIAGNOSIS — Z171 Estrogen receptor negative status [ER-]: Secondary | ICD-10-CM | POA: Diagnosis not present

## 2018-04-06 DIAGNOSIS — C50412 Malignant neoplasm of upper-outer quadrant of left female breast: Secondary | ICD-10-CM | POA: Diagnosis not present

## 2018-04-06 DIAGNOSIS — Z79899 Other long term (current) drug therapy: Secondary | ICD-10-CM

## 2018-04-06 DIAGNOSIS — Z170 Estrogen receptor positive status [ER+]: Secondary | ICD-10-CM | POA: Diagnosis not present

## 2018-04-06 DIAGNOSIS — R110 Nausea: Secondary | ICD-10-CM

## 2018-04-06 DIAGNOSIS — C50512 Malignant neoplasm of lower-outer quadrant of left female breast: Secondary | ICD-10-CM

## 2018-04-06 LAB — CMP (CANCER CENTER ONLY)
ALT: 23 U/L (ref 0–44)
ANION GAP: 10 (ref 5–15)
AST: 9 U/L — ABNORMAL LOW (ref 15–41)
Albumin: 3.6 g/dL (ref 3.5–5.0)
Alkaline Phosphatase: 140 U/L — ABNORMAL HIGH (ref 38–126)
BUN: 22 mg/dL (ref 8–23)
CO2: 26 mmol/L (ref 22–32)
Calcium: 9.1 mg/dL (ref 8.9–10.3)
Chloride: 108 mmol/L (ref 98–111)
Creatinine: 0.76 mg/dL (ref 0.44–1.00)
GFR, Est AFR Am: >60 mL/min (ref >60)
Glucose, Bld: 124 mg/dL — ABNORMAL HIGH (ref 70–99)
Potassium: 3.2 mmol/L — ABNORMAL LOW (ref 3.5–5.1)
Sodium: 144 mmol/L (ref 135–145)
Total Bilirubin: 0.4 mg/dL (ref 0.3–1.2)
Total Protein: 6.4 g/dL — ABNORMAL LOW (ref 6.5–8.1)

## 2018-04-06 LAB — CBC WITH DIFFERENTIAL (CANCER CENTER ONLY)
Abs Immature Granulocytes: 0.18 10*3/uL — ABNORMAL HIGH (ref 0.00–0.07)
BASOS ABS: 0 10*3/uL (ref 0.0–0.1)
Basophils Relative: 0 %
EOS ABS: 0.1 10*3/uL (ref 0.0–0.5)
Eosinophils Relative: 1 %
HCT: 33.2 % — ABNORMAL LOW (ref 36.0–46.0)
Hemoglobin: 10.4 g/dL — ABNORMAL LOW (ref 12.0–15.0)
Immature Granulocytes: 2 %
Lymphocytes Relative: 17 %
Lymphs Abs: 1.5 10*3/uL (ref 0.7–4.0)
MCH: 27.4 pg (ref 26.0–34.0)
MCHC: 31.3 g/dL (ref 30.0–36.0)
MCV: 87.6 fL (ref 80.0–100.0)
Monocytes Absolute: 0.4 10*3/uL (ref 0.1–1.0)
Monocytes Relative: 4 %
NRBC: 0 % (ref 0.0–0.2)
Neutro Abs: 6.8 10*3/uL (ref 1.7–7.7)
Neutrophils Relative %: 76 %
Platelet Count: 118 10*3/uL — ABNORMAL LOW (ref 150–400)
RBC: 3.79 MIL/uL — ABNORMAL LOW (ref 3.87–5.11)
RDW: 13.3 % (ref 11.5–15.5)
WBC: 9 10*3/uL (ref 4.0–10.5)

## 2018-04-06 NOTE — Progress Notes (Signed)
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 She underwent bilateral diagnostic mammography with tomography and left breast ultrasonography at [facility] on 02/16/2018 showing: breast density category C; two left breast masses, one at 2 o'clock and the other at 3:30 o'clock. The 2 o'clock mass (1.3 x 1 x 1 cm) corresponds to the mammographic abnormality and is highly suspicious for breast carcinoma. The other mass (1.2 x 0.7 x 1.2 cm)  is suspicious for breast carcinoma. No left axillary adenopathy.   Accordingly on 02/19/2018 she proceeded to biopsy of the left breast area in question. 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 Prognostic indicators significant for: estrogen receptor, 90% positive and progesterone receptor, 95% positive, both with strong staining intensity. Proliferation marker Ki67 at 5%. HER2 negative (1+).   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 Prognostic indicators significant for: estrogen receptor, 10% positive with moderate staining intensity and progesterone receptor, 0% negative. Proliferation marker Ki67  at 40%. HER2 negative (1+).  The patient's subsequent history is as detailed below.   INTERVAL HISTORY: Emily Chambers is here today for follow up of her estrogen positive and estrogen negative breast cancer prior to receiving treatment with neoadjuvant chemotherapy on Wednesday, 03/17/2018.  She will be receiving 4 cycles of neoadjuvant Doxorubicin and Cyclophosphamide with Udenyca support given once every 2 weeks, followed by weekly Paclitaxel and Carboplatin x 12 weeks.  Today is cycle 2 day 8 of treatment.  She notes that her nausea is better than it was with the first cycle.   REVIEW OF SYSTEMS: Emily Chambers is doing moderately well.  She does have some nausea, but it is much better controlled with her taking the anti emetics and omeprazole as prescribed.  She is also taking a stool softener daily so she doesn't note any issues with constipation.  She denies fevers or chills. She is without chest pain, palpitations, cough, or shortness of breath.  She denies vomiting, diarrhea, or any bladder changes.  A detailed ROS was otherwise non contributory.     PAST MEDICAL HISTORY: Past Medical History:  Diagnosis Date  . Family history of breast cancer   . Family history of lung cancer   . Family history of throat cancer   . High cholesterol   . Hypertension   . Kidney stone   . Kidney stones   . UTI (lower urinary tract infection)     PAST SURGICAL HISTORY: Past Surgical History:  Procedure Laterality Date  . BREAST EXCISIONAL BIOPSY Left 1999,2005   cysts removed  . BREAST SURGERY Left    cyst and biopsy  . PORTACATH PLACEMENT Left 03/10/2018   Procedure: INSERTION PORT-A-CATH;  Surgeon: Stark Klein, MD;  Location: [facility];  Service: General;  Laterality: Left;    FAMILY  HISTORY Family History  Problem Relation Age of Onset  . Hypertension Mother   . Heart disease Mother   . Dementia Mother   . Healthy Father   . Colon cancer Maternal Grandmother   . Cancer  Paternal Grandmother        unk type  . Lung cancer Cousin        57s  . Breast cancer Cousin        78s  . Breast cancer Cousin        47s  . Breast cancer Cousin   . Cancer Paternal Aunt        unk type d. 61, possibly pancreatic  . Cancer Paternal Aunt        unk type d. 28s  . Cancer Paternal Uncle        unk type   . Cancer Paternal Uncle        unk type d. 57s  . Cancer Maternal Aunt        unk type, d. 5s  . Throat cancer Maternal Uncle        d. 21s  . Ovarian cancer Neg Hx    Patient father is alive at 61 years old. Patient mother died from heart disease and dementia at age 54.  The patient denies a family hx of ovarian cancer. She has 3 siblings, 1 brother and 2 sisters. She has a maternal cousin diagnosed with lung cancer in her 58s. She has 3 paternal cousins with breast cancer, one was diagnosed in her 70s and has passed away.  GYNECOLOGIC HISTORY:  No LMP recorded. Patient is postmenopausal. Menarche: 68 years old Age at first live birth: n/a GX P 0 LMP 2010 Contraceptive n/a HRT no  Hysterectomy? no BSo? no   SOCIAL HISTORY: She is single and works as a Scientist, water quality at [employer]). She lives alone, with no pets.      ADVANCED DIRECTIVES: Not in place.  At the 03/03/2018 visit she was given the appropriate documents to complete and notarize at her discretion.  She is planning to name her niece, [name], as her HCPOA.   HEALTH MAINTENANCE: Social History   Tobacco Use  . Smoking status: Never Smoker  . Smokeless tobacco: Never Used  Substance Use Topics  . Alcohol use: Yes    Comment: seldom  . Drug use: No     Colonoscopy: 2014? [facility]  PAP: 02/02/2018  Bone density: 03/15/2016, T-score 0.4, Dr. Alden Hipp   No Known Allergies  Current Outpatient Medications  Medication Sig Dispense Refill  . amLODipine (NORVASC) 10 MG tablet Take 10 mg by mouth every morning.     Marland Kitchen aspirin 81 MG chewable tablet  Chew 81 mg by mouth daily.    Marland Kitchen dexamethasone (DECADRON) 4 MG tablet Take 2 tablets by mouth twice a day for three days starting the day after chemotherapy. Take with food. 30 tablet 1  . lidocaine-prilocaine (EMLA) cream Apply 1 application topically as needed. 30 g 0  . LORazepam (ATIVAN) 0.5 MG tablet Take 1 tablet (0.5 mg total) by mouth every 6 (six) hours as needed (Nausea or vomiting). 30 tablet 0  . losartan (COZAAR) 100 MG tablet Take 100 mg by mouth every morning.     Marland Kitchen omeprazole (PRILOSEC) 40 MG capsule Take 1 capsule (40 mg total) by mouth daily. 30 capsule 5  . oxyCODONE (OXY IR/ROXICODONE) 5 MG immediate release tablet Take 1 tablet (5 mg total) by  mouth every 6 (six) hours as needed for severe pain. 10 tablet 0  . potassium chloride SA (K-DUR,KLOR-CON) 20 MEQ tablet Take 1 tablet (20 mEq total) by mouth daily. 5 tablet 0  . pravastatin (PRAVACHOL) 20 MG tablet Take 20 mg by mouth every morning.     . prochlorperazine (COMPAZINE) 10 MG tablet Take 1 tablet (10 mg total) by mouth every 6 (six) hours as needed (Nausea or vomiting). 30 tablet 1   No current facility-administered medications for this visit.     OBJECTIVE:  Vitals:   04/06/18 0928  BP: 120/69  Pulse: 79  Resp: 18  Temp: 98.5 F (36.9 C)  SpO2: 99%     Body mass index is 31.84 kg/m.   Wt Readings from Last 3 Encounters:  04/06/18 174 lb 1.6 oz (79 kg)  03/30/18 177 lb (80.3 kg)  03/23/18 175 lb 5 oz (79.5 kg)  ECOG FS:1 GENERAL: Patient is a well appearing female in no acute distress HEENT:  Sclerae anicteric.  Oropharynx clear and moist. No ulcerations or evidence of oropharyngeal candidiasis. Neck is supple.  NODES:  No cervical, supraclavicular, or axillary lymphadenopathy palpated.  BREAST EXAM:  Unable to palpate left breast mass LUNGS:  Clear to auscultation bilaterally.  No wheezes or rhonchi. HEART:  Regular rate and rhythm. No murmur appreciated. ABDOMEN:  Soft, nontender.  Positive, normoactive  bowel sounds. No organomegaly palpated. MSK:  No focal spinal tenderness to palpation. Full range of motion bilaterally in the upper extremities. EXTREMITIES:  No peripheral edema.   SKIN:  Clear with no obvious rashes or skin changes. No nail dyscrasia. NEURO:  Nonfocal. Well oriented.  Appropriate affect.     LAB RESULTS:  CMP     Component Value Date/Time   NA 143 03/30/2018 1212   K 4.0 03/30/2018 1212   CL 107 03/30/2018 1212   CO2 29 03/30/2018 1212   GLUCOSE 100 (H) 03/30/2018 1212   BUN 10 03/30/2018 1212   CREATININE 0.66 03/30/2018 1212   CALCIUM 9.5 03/30/2018 1212   PROT 7.0 03/30/2018 1212   ALBUMIN 3.8 03/30/2018 1212   AST 13 (L) 03/30/2018 1212   ALT 23 03/30/2018 1212   ALKPHOS 130 (H) 03/30/2018 1212   BILITOT 0.4 03/30/2018 1212   GFRNONAA >60 03/30/2018 1212   GFRAA >60 03/30/2018 1212    No results found for: TOTALPROTELP, ALBUMINELP, A1GS, A2GS, BETS, BETA2SER, GAMS, MSPIKE, SPEI  No results found for: KPAFRELGTCHN, LAMBDASER, KAPLAMBRATIO  Lab Results  Component Value Date   WBC 9.0 04/06/2018   NEUTROABS PENDING 04/06/2018   HGB 10.4 (L) 04/06/2018   HCT 33.2 (L) 04/06/2018   MCV 87.6 04/06/2018   PLT 118 (L) 04/06/2018    _0 @  No results found for: LABCA2  No components found for: [id number]  No results for input(s): INR in the last 168 hours.  No results found for: LABCA2  No results found for: PYK998  No results found for: PJA250  No results found for: NLZ767  No results found for: CA2729  No components found for: HGQUANT  No results found for: CEA1 / No results found for: CEA1   No results found for: AFPTUMOR  No results found for: CHROMOGRNA  No results found for: PSA1  Appointment on 04/06/2018  Component Date Value Ref Range Status  . WBC Count 04/06/2018 9.0  4.0 - 10.5 K/uL Final  . RBC 04/06/2018 3.79* 3.87 - 5.11 MIL/uL Final  . Hemoglobin 04/06/2018 10.4* 12.0 - 15.0  g/dL Final  . HCT  04/06/2018 33.2* 36.0 - 46.0 % Final  . MCV 04/06/2018 87.6  80.0 - 100.0 fL Final  . MCH 04/06/2018 27.4  26.0 - 34.0 pg Final  . MCHC 04/06/2018 31.3  30.0 - 36.0 g/dL Final  . RDW 04/06/2018 13.3  11.5 - 15.5 % Final  . Platelet Count 04/06/2018 118* 150 - 400 K/uL Final  . nRBC 04/06/2018 0.0  0.0 - 0.2 % Final   Performed at [facility], [place] [address], [place], [place] [address]  . Neutrophils Relative % 04/06/2018 PENDING  % Incomplete  . Neutro Abs 04/06/2018 PENDING  1.7 - 7.7 K/uL Incomplete  . Band Neutrophils 04/06/2018 PENDING  % Incomplete  . Lymphocytes Relative 04/06/2018 PENDING  % Incomplete  . Lymphs Abs 04/06/2018 PENDING  0.7 - 4.0 K/uL Incomplete  . Monocytes Relative 04/06/2018 PENDING  % Incomplete  . Monocytes Absolute 04/06/2018 PENDING  0.1 - 1.0 K/uL Incomplete  . Eosinophils Relative 04/06/2018 PENDING  % Incomplete  . Eosinophils Absolute 04/06/2018 PENDING  0.0 - 0.5 K/uL Incomplete  . Basophils Relative 04/06/2018 PENDING  % Incomplete  . Basophils Absolute 04/06/2018 PENDING  0.0 - 0.1 K/uL Incomplete  . WBC Morphology 04/06/2018 PENDING   Incomplete  . RBC Morphology 04/06/2018 PENDING   Incomplete  . Smear Review 04/06/2018 PENDING   Incomplete  . Other 04/06/2018 PENDING  % Incomplete  . nRBC 04/06/2018 PENDING  0 /100 WBC Incomplete  . Metamyelocytes Relative 04/06/2018 PENDING  % Incomplete  . Myelocytes 04/06/2018 PENDING  % Incomplete  . Promyelocytes Relative 04/06/2018 PENDING  % Incomplete  . Blasts 04/06/2018 PENDING  % Incomplete    (this displays the last labs from the last 3 days)  No results found for: TOTALPROTELP, ALBUMINELP, A1GS, A2GS, BETS, BETA2SER, GAMS, MSPIKE, SPEI (this displays SPEP labs)  No results found for: KPAFRELGTCHN, LAMBDASER, KAPLAMBRATIO (kappa/lambda light chains)  No results found for: HGBA, HGBA2QUANT, HGBFQUANT, HGBSQUAN (Hemoglobinopathy evaluation)   No results found for:  LDH  No results found for: IRON, TIBC, IRONPCTSAT (Iron and TIBC)  No results found for: FERRITIN  Urinalysis    Component Value Date/Time   COLORURINE BROWN (A) 07/17/2011 1722   APPEARANCEUR TURBID (A) 07/17/2011 1722   LABSPEC 1.025 07/07/2012 1159   PHURINE 6.5 07/07/2012 1159   GLUCOSEU 100 (A) 07/07/2012 1159   HGBUR LARGE (A) 07/07/2012 1159   BILIRUBINUR SMALL (A) 07/07/2012 1159   KETONESUR NEGATIVE 07/07/2012 1159   PROTEINUR 100 (A) 07/07/2012 1159   UROBILINOGEN 0.2 07/07/2012 1159   NITRITE POSITIVE (A) 07/07/2012 1159   LEUKOCYTESUR NEGATIVE 07/07/2012 1159     STUDIES: Mr Breast Bilateral W Wo Contrast Inc Cad  Result Date: 03/11/2018 CLINICAL DATA:  68 year old patient with recent diagnosis of invasive mammary carcinoma of the left breast at 2 separate biopsy sites, including a mass at 2 o'clock position and a mass at 3:30 position presents for bilateral breast MRI. She has a remote history of a left breast excisional biopsy, upper outer quadrant. LABS:  None obtained EXAM: BILATERAL BREAST MRI WITH AND WITHOUT CONTRAST TECHNIQUE: Multiplanar, multisequence MR images of both breasts were obtained prior to and following the intravenous administration of 8 ml of Gadavist Three-dimensional MR images were rendered by post-processing of the original MR data on an independent workstation. The three-dimensional MR images were interpreted, and findings are reported in the following complete MRI report for this study. Three dimensional images were evaluated at the  independent DynaCad workstation COMPARISON:  Previous exam(s). FINDINGS: Breast composition: c. Heterogeneous fibroglandular tissue. Background parenchymal enhancement: Moderate. Right breast: Scattered benign intramammary lymph nodes and scattered foci of enhancement. In the anterior third of the medial right breast, near the level of the nipple, is a slightly irregular enhancing mass with washout kinetics measuring 0.5  x 0.4 cm. While this could be a benign intramammary lymph node, given its medial position and possible slight irregularity in shape, further evaluation is recommended. No mammographic correlate is identified. Left breast: There is signal void artifact related to a biopsy clip and/or biopsy changes in the posterior third of the upper outer left breast at the site the biopsied mass at 2 o'clock position, with a coil shaped biopsy clip placed. This mass measures approximately 1.1 x 0.8 cm on MRI. In the lower outer quadrant of the left breast is an irregular enhancing mass measuring approximately 0.9 x 0.8 cm. This is felt to be this second mass that was biopsied in the 3:30 position of the left breast. Possible biopsy clip artifact on T2 weighted image, adjacent to the small mass. Biopsy clip artifact related to the known ribbon shaped biopsy clip in the left breast is not very distinct on the MRI images. Scattered benign intramammary lymph nodes in the left breast, with fatty hila. Lymph nodes: No abnormal appearing axillary or internal mammary chain lymph nodes. Ancillary findings: Port-A-Cath in the far superior inner left breast. IMPRESSION: Biopsy-proven multicentric malignancy in the left breast, with malignancies diagnosed at 2 o'clock position and 3:30 position. Small irregular masses are seen at the suspected biopsy sites, measuring 1.1 and 0.9 cm, at 2 o'clock and 3:30 positions, respectively. No additional suspicious areas in the left breast. 0.5 cm slightly irregular enhancing mass with washout kinetics in the anterior third of the medial right breast, 3 o'clock region. Malignancy can not be excluded. RECOMMENDATION: Second-look ultrasound of the right breast is recommended. If a suspicious or indeterminate correlate to the MRI detected mass is identified, ultrasound-guided core needle biopsy can be performed. If a definite benign intramammary lymph node is detected and correlates with the finding on MRI,  biopsy may not be necessary. If no correlate is seen on ultrasound, then MRI guided right breast biopsy can be performed. BI-RADS CATEGORY  4: Suspicious. Electronically Signed   By: Curlene Dolphin M.D.   On: 03/11/2018 15:47   Dg Chest Port 1 View  Result Date: 03/10/2018 CLINICAL DATA:  Port-A-Cath insertion. EXAM: PORTABLE CHEST 1 VIEW COMPARISON:  06/20/2010 FINDINGS: Power port in place. The tip is at the cavoatrial junction in good position. Heart size and vascularity are normal. Lungs are clear. No bone abnormality. IMPRESSION: Port-A-Cath in good position.  No acute abnormalities. Electronically Signed   By: Lorriane Shire M.D.   On: 03/10/2018 16:05   Dg Fluoro Guide Cv Line-no Report  Result Date: 03/10/2018 Fluoroscopy was utilized by the requesting physician.  No radiographic interpretation.   US Breast Ltd Uni Right Inc Axilla  Result Date: 03/16/2018 CLINICAL DATA:  5 mm slightly irregular enhancing mass with washout kinetics in the anterior 3rd of the medial right breast in the 3 o'clock region. Recently diagnosed left breast cancer. EXAM: ULTRASOUND OF THE RIGHT BREAST COMPARISON:  Previous examinations, including the bilateral breast MRI dated 03/11/2018. FINDINGS: On physical exam, no mass is palpable in the medial right breast or right axilla. Targeted ultrasound is performed, showing a 5 x 4 x 3 mm mildly irregular hypoechoic mass with  low-level internal echoes in the 3 o'clock position of the right breast, 2 cm from the nipple. The ultrasound images are incorrectly labeled 9 o'clock. No internal blood flow was seen with power Doppler. Ultrasound of the right axilla demonstrated normal appearing right axillary lymph nodes. IMPRESSION: 5 mm indeterminate mass in the 3 o'clock position of the right breast, corresponding to the 5 mm indeterminate mass seen on the MRI. RECOMMENDATION: Ultrasound-guided core needle biopsy of the 5 mm mass in the 3 o'clock position of the right breast. This  has been discussed with the patient and is scheduled to follow. I have discussed the findings and recommendations with the patient. Results were also provided in writing at the conclusion of the visit. If applicable, a reminder letter will be sent to the patient regarding the next appointment. BI-RADS CATEGORY  4: Suspicious. Electronically Signed   By: Claudie Revering M.D.   On: 03/16/2018 09:54   Mm Clip Placement Right  Addendum Date: 03/16/2018   ADDENDUM REPORT: 03/16/2018 17:12 ADDENDUM: The clinical data should read as follows: Status post ultrasound-guided core needle biopsy of a 5 mm mass in the 3 o'clock position of the right breast. Electronically Signed   By: Claudie Revering M.D.   On: 03/16/2018 17:12   Result Date: 03/16/2018 CLINICAL DATA:  Status post MR guided core needle biopsy of a 5 mm mass in the 3 o'clock position of the right breast. EXAM: DIAGNOSTIC RIGHT MAMMOGRAM POST ULTRASOUND BIOPSY COMPARISON:  Previous exam(s). FINDINGS: Mammographic images were obtained following ultrasound guided biopsy of a 5 mm mass in the 3 o'clock position of the right breast. These demonstrate a ribbon shaped biopsy marker clip at the expected location of the biopsied mass. IMPRESSION: Appropriate clip deployment following right breast ultrasound-guided core needle biopsy. Final Assessment: Post Procedure Mammograms for Marker Placement Electronically Signed: By: Claudie Revering M.D. 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 This was found to be concordant by Dr. Claudie Revering, with excision recommended. Pathology results were discussed with the patient by telephone. The patient reported doing well after the biopsy with tenderness at the site. Post biopsy instructions and care were reviewed and questions were answered. The patient was  encouraged to call [facility] for any additional concerns. The patient has a recent diagnosis of LEFT breast cancer and should follow her outlined treatment plan. Dr. Stark Klein was notified of biopsy results via EPIC message on March 18, 2018. Pathology results reported by Terie Purser, RN on 03/18/2018. Electronically Signed   By: Claudie Revering M.D.   On: 03/18/2018 06:48   Result Date: 03/18/2018 CLINICAL DATA:  5 mm indeterminate mass in the 3 o'clock position of the right breast at recent MRI and ultrasound. Recently diagnosed left breast cancer. EXAM: ULTRASOUND GUIDED RIGHT BREAST CORE NEEDLE BIOPSY COMPARISON:  Previous exam(s). FINDINGS: I met with the patient and we discussed the procedure of ultrasound-guided biopsy, including benefits and alternatives. We discussed the high likelihood of a successful procedure. We discussed the risks of the procedure, including infection, bleeding, tissue injury, clip migration, and inadequate sampling. Informed written consent was given. The usual time-out protocol was performed immediately prior to the procedure. Using sterile technique and 1% Lidocaine as local anesthetic, under direct ultrasound visualization, a 12 gauge spring-loaded  device was used to perform biopsy of the recently demonstrated 5 mm mass in the 3 o'clock position of the right breast using a caudal approach. At the conclusion of the procedure a ribbon shaped tissue marker clip was deployed into the biopsy cavity. Follow up 2 view mammogram was performed and dictated separately. IMPRESSION: Ultrasound guided biopsy of the recently demonstrated 5 mm mass in the 3 o'clock position of the right breast. No apparent complications. Electronically Signed: By: Claudie Revering M.D. On: 03/16/2018 09:55    ELIGIBLE FOR AVAILABLE RESEARCH PROTOCOL: Upbeat  ASSESSMENT: 68 y.o. 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, grade 2, estrogen and progesterone receptor positive, HER-2 not amplified, with an MIB-1 of 5%  (b) in the lower outer quadrant a clinical T1c N0, stage IA-B invasive ductal carcinoma, grade 2, estrogen receptor only moderately positive at 10%, progesterone receptor negative, with an MIB-1 of 40%, and HER-2 not amplified  (1) neoadjuvant chemotherapy will consist of doxorubicin and cyclophosphamide in dose dense fashion x4 starting 03/16/2018, followed by paclitaxel and carboplatin weekly x12  (a)echo on 03/11/2018 shows well preserved EF of 60-65%  (2) definitive surgery to follow  (3) adjuvant radiation as appropriate  (4) antiestrogens to follow at the completion of local treatment (for upper outer quadrant tumor)  (5) genetics testing on 03/24/2018 showed no pathogenic mutations.  Genes tested include:  APC, ATM, AXIN2, BARD1, BMPR1A, BRCA1, BRCA2, BRIP1, CDH1, CDKN2A (p14ARF), CDKN2A (p16INK4a), CKD4, CHEK2, CTNNA1, DICER1, EPCAM (Deletion/duplication testing only), GREM1 (promoter region deletion/duplication testing only), KIT, MEN1, MLH1, MSH2, MSH3, MSH6, MUTYH, NBN, NF1, NHTL1, PALB2, PDGFRA, PMS2, POLD1, POLE, PTEN, RAD50, RAD51C, RAD51D, SDHB, SDHC, SDHD, SMAD4, SMARCA4. STK11, TP53, TSC1, TSC2, and VHL.  The following genes were evaluated for sequence changes only: SDHA and HOXB13 c.251G>A variant only.  PLAN:  Emily Chambers is doing well today.  She tolerated her second cycle of chemotherapy with Doxorubicin and cyclophosphamide.  Her nausea is improved and I encouraged her to continue taking her anti emetics and the omeprazole we prescribed.  Emily Chambers and I talked briefly about her chemotherapy regimen, and the timing of everything.    Emily Chambers labs are stable.  She is not neutropenic.  I reviewed this with her in detail.    Emily Chambers will return in one week for labs, f/u, and cycle 3 of Doxorubicin and Cyclophosphamide. She  will call with any problems that may develop before her next visit here.  A total of (20) minutes of face-to-face time was spent with this patient with greater than 50% of that time in counseling and care-coordination.   Scot Dock, NP   04/06/2018 9:31 AM Medical Oncology and Hematology [facility] [address] [place], [place] [address] Tel. [phone]    Fax. [phone]

## 2018-04-06 NOTE — Progress Notes (Signed)
FMLA successfully faxed to [employer] at [phone]. Mailed copy to patient address on file. 

## 2018-04-06 NOTE — Telephone Encounter (Signed)
LVM for patient to call back.  Need to give lab results.

## 2018-04-06 NOTE — Telephone Encounter (Signed)
No los °

## 2018-04-09 ENCOUNTER — Other Ambulatory Visit: Payer: Self-pay | Admitting: Adult Health

## 2018-04-09 DIAGNOSIS — C50412 Malignant neoplasm of upper-outer quadrant of left female breast: Secondary | ICD-10-CM

## 2018-04-09 DIAGNOSIS — C50512 Malignant neoplasm of lower-outer quadrant of left female breast: Secondary | ICD-10-CM

## 2018-04-09 DIAGNOSIS — Z170 Estrogen receptor positive status [ER+]: Secondary | ICD-10-CM

## 2018-04-09 DIAGNOSIS — Z171 Estrogen receptor negative status [ER-]: Principal | ICD-10-CM

## 2018-04-12 ENCOUNTER — Telehealth: Payer: Self-pay | Admitting: *Deleted

## 2018-04-12 ENCOUNTER — Inpatient Hospital Stay: Payer: Medicare Other

## 2018-04-12 DIAGNOSIS — R309 Painful micturition, unspecified: Secondary | ICD-10-CM

## 2018-04-12 DIAGNOSIS — Z5111 Encounter for antineoplastic chemotherapy: Secondary | ICD-10-CM | POA: Diagnosis not present

## 2018-04-12 LAB — URINALYSIS, COMPLETE (UACMP) WITH MICROSCOPIC
Bacteria, UA: NONE SEEN
Bilirubin Urine: NEGATIVE
Glucose, UA: NEGATIVE mg/dL
Hgb urine dipstick: NEGATIVE
Ketones, ur: NEGATIVE mg/dL
NITRITE: NEGATIVE
Protein, ur: NEGATIVE mg/dL
Specific Gravity, Urine: 1.02 (ref 1.005–1.030)
pH: 5 (ref 5.0–8.0)

## 2018-04-12 NOTE — Telephone Encounter (Signed)
This RN attempted to contact pt per result of UA showing only a trace of leukocytes.  Obtained identified VM - message left per above and need for culture for best results, can follow up tomorrow at scheduled visit.

## 2018-04-12 NOTE — Telephone Encounter (Signed)
This RN spoke with pt per her VM stating " I think I have a UTI "  Emily Chambers states she has " pain at the very end of the stream ".  She does not have any other stinging or burning.  Pt will come in today for UA and cx.  Appointment and order entered.

## 2018-04-13 ENCOUNTER — Inpatient Hospital Stay (HOSPITAL_BASED_OUTPATIENT_CLINIC_OR_DEPARTMENT_OTHER): Payer: Medicare Other | Admitting: Adult Health

## 2018-04-13 ENCOUNTER — Inpatient Hospital Stay: Payer: Medicare Other

## 2018-04-13 ENCOUNTER — Other Ambulatory Visit: Payer: Self-pay | Admitting: Oncology

## 2018-04-13 ENCOUNTER — Encounter: Payer: Self-pay | Admitting: *Deleted

## 2018-04-13 ENCOUNTER — Encounter: Payer: Self-pay | Admitting: Adult Health

## 2018-04-13 VITALS — BP 141/77 | HR 79 | Temp 98.0°F | Resp 18 | Ht 62.0 in | Wt 177.1 lb

## 2018-04-13 DIAGNOSIS — Z5111 Encounter for antineoplastic chemotherapy: Secondary | ICD-10-CM | POA: Diagnosis not present

## 2018-04-13 DIAGNOSIS — Z170 Estrogen receptor positive status [ER+]: Secondary | ICD-10-CM | POA: Diagnosis not present

## 2018-04-13 DIAGNOSIS — Z171 Estrogen receptor negative status [ER-]: Principal | ICD-10-CM

## 2018-04-13 DIAGNOSIS — C50512 Malignant neoplasm of lower-outer quadrant of left female breast: Secondary | ICD-10-CM

## 2018-04-13 DIAGNOSIS — Z95828 Presence of other vascular implants and grafts: Secondary | ICD-10-CM

## 2018-04-13 DIAGNOSIS — C50412 Malignant neoplasm of upper-outer quadrant of left female breast: Secondary | ICD-10-CM

## 2018-04-13 DIAGNOSIS — Z79899 Other long term (current) drug therapy: Secondary | ICD-10-CM

## 2018-04-13 LAB — CBC WITH DIFFERENTIAL (CANCER CENTER ONLY)
Abs Immature Granulocytes: 1.46 10*3/uL — ABNORMAL HIGH (ref 0.00–0.07)
Basophils Absolute: 0.2 10*3/uL — ABNORMAL HIGH (ref 0.0–0.1)
Basophils Relative: 2 %
EOS ABS: 0.1 10*3/uL (ref 0.0–0.5)
EOS PCT: 1 %
HCT: 32.6 % — ABNORMAL LOW (ref 36.0–46.0)
Hemoglobin: 10 g/dL — ABNORMAL LOW (ref 12.0–15.0)
Immature Granulocytes: 12 %
Lymphocytes Relative: 13 %
Lymphs Abs: 1.6 10*3/uL (ref 0.7–4.0)
MCH: 28 pg (ref 26.0–34.0)
MCHC: 30.7 g/dL (ref 30.0–36.0)
MCV: 91.3 fL (ref 80.0–100.0)
Monocytes Absolute: 0.6 10*3/uL (ref 0.1–1.0)
Monocytes Relative: 5 %
Neutro Abs: 8.2 10*3/uL — ABNORMAL HIGH (ref 1.7–7.7)
Neutrophils Relative %: 67 %
Platelet Count: 149 10*3/uL — ABNORMAL LOW (ref 150–400)
RBC: 3.57 MIL/uL — ABNORMAL LOW (ref 3.87–5.11)
RDW: 14.5 % (ref 11.5–15.5)
WBC Count: 12 10*3/uL — ABNORMAL HIGH (ref 4.0–10.5)
nRBC: 0.7 % — ABNORMAL HIGH (ref 0.0–0.2)

## 2018-04-13 LAB — CMP (CANCER CENTER ONLY)
ALBUMIN: 3.7 g/dL (ref 3.5–5.0)
ALT: 24 U/L (ref 0–44)
ANION GAP: 9 (ref 5–15)
AST: 12 U/L — ABNORMAL LOW (ref 15–41)
Alkaline Phosphatase: 126 U/L (ref 38–126)
BUN: 10 mg/dL (ref 8–23)
CALCIUM: 9.2 mg/dL (ref 8.9–10.3)
CO2: 25 mmol/L (ref 22–32)
Chloride: 111 mmol/L (ref 98–111)
Creatinine: 0.8 mg/dL (ref 0.44–1.00)
GFR, Est AFR Am: >60 mL/min (ref >60)
GFR, Estimated: >60 mL/min (ref >60)
Glucose, Bld: 142 mg/dL — ABNORMAL HIGH (ref 70–99)
POTASSIUM: 3.5 mmol/L (ref 3.5–5.1)
Sodium: 145 mmol/L (ref 135–145)
Total Bilirubin: <0.2 mg/dL — ABNORMAL LOW (ref 0.3–1.2)
Total Protein: 6.7 g/dL (ref 6.5–8.1)

## 2018-04-13 LAB — URINE CULTURE

## 2018-04-13 MED ORDER — SODIUM CHLORIDE 0.9 % IV SOLN
Freq: Once | INTRAVENOUS | Status: AC
  Administered 2018-04-13: 16:00:00 via INTRAVENOUS
  Filled 2018-04-13: qty 5

## 2018-04-13 MED ORDER — CIPROFLOXACIN HCL 500 MG PO TABS: 500.0000 mg | ORAL_TABLET | Freq: Two times a day (BID) | ORAL | 0 refills | Status: DC

## 2018-04-13 MED ORDER — SODIUM CHLORIDE 0.9% FLUSH
10.0000 mL | INTRAVENOUS | Status: DC | PRN
  Administered 2018-04-13: 10 mL
  Filled 2018-04-13: qty 10

## 2018-04-13 MED ORDER — DOXORUBICIN HCL CHEMO IV INJECTION 2 MG/ML
60.0000 mg/m2 | Freq: Once | INTRAVENOUS | Status: AC
  Administered 2018-04-13: 112 mg via INTRAVENOUS
  Filled 2018-04-13: qty 56

## 2018-04-13 MED ORDER — PALONOSETRON HCL INJECTION 0.25 MG/5ML
0.2500 mg | Freq: Once | INTRAVENOUS | Status: AC
  Administered 2018-04-13: 0.25 mg via INTRAVENOUS

## 2018-04-13 MED ORDER — SODIUM CHLORIDE 0.9% FLUSH
10.0000 mL | Freq: Once | INTRAVENOUS | Status: AC
  Administered 2018-04-13: 10 mL
  Filled 2018-04-13: qty 10

## 2018-04-13 MED ORDER — HEPARIN SOD (PORK) LOCK FLUSH 100 UNIT/ML IV SOLN
500.0000 [IU] | Freq: Once | INTRAVENOUS | Status: AC | PRN
  Administered 2018-04-13: 500 [IU]
  Filled 2018-04-13: qty 5

## 2018-04-13 MED ORDER — PALONOSETRON HCL INJECTION 0.25 MG/5ML
INTRAVENOUS | Status: AC
  Filled 2018-04-13: qty 5

## 2018-04-13 MED ORDER — SODIUM CHLORIDE 0.9 % IV SOLN
Freq: Once | INTRAVENOUS | Status: AC
  Administered 2018-04-13: 15:00:00 via INTRAVENOUS
  Filled 2018-04-13: qty 250

## 2018-04-13 MED ORDER — SODIUM CHLORIDE 0.9 % IV SOLN
600.0000 mg/m2 | Freq: Once | INTRAVENOUS | Status: AC
  Administered 2018-04-13: 1120 mg via INTRAVENOUS
  Filled 2018-04-13: qty 56

## 2018-04-13 NOTE — Progress Notes (Signed)
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 She underwent bilateral diagnostic mammography with tomography and left breast ultrasonography at [facility] on 02/16/2018 showing: breast density category C; two left breast masses, one at 2 o'clock and the other at 3:30 o'clock. The 2 o'clock mass (1.3 x 1 x 1 cm) corresponds to the mammographic abnormality and is highly suspicious for breast carcinoma. The other mass (1.2 x 0.7 x 1.2 cm)  is suspicious for breast carcinoma. No left axillary adenopathy.   Accordingly on 02/19/2018 she proceeded to biopsy of the left breast area in question. 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 Prognostic indicators significant for: estrogen receptor, 90% positive and progesterone receptor, 95% positive, both with strong staining intensity. Proliferation marker Ki67 at 5%. HER2 negative (1+).   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 Prognostic indicators significant for: estrogen receptor, 10% positive with moderate staining intensity and progesterone receptor, 0% negative. Proliferation marker Ki67  at 40%. HER2 negative (1+).  The patient's subsequent history is as detailed below.   INTERVAL HISTORY: Emily Chambers is here today for follow up of her estrogen positive and estrogen negative breast cancer prior to receiving treatment with neoadjuvant chemotherapy on Wednesday, 03/17/2018.  She will be receiving 4 cycles of neoadjuvant Doxorubicin and Cyclophosphamide with Udenyca support given once every 2 weeks, followed by weekly Paclitaxel and Carboplatin x 12 weeks.  Today is cycle 3 day 1 of Doxorubicin and Cyclophosphamide.   She is doing well today.    REVIEW OF SYSTEMS: Emily Chambers has some mild intermittent nausea.  She notes that she is eating and drinking well, and has kept her appetite.  She denies any bowel changes.  She is without fevers or chills, pain.  She did start to have dysuria over the weekend.  She has increased her cranberry and pomegranate juice intake which has helped.  She denies hematuria.  She left a urine specimen yesterday to test for UTI.  Otherwise, a detailed ROS was otherwise non contributory.    PAST MEDICAL HISTORY: Past Medical History:  Diagnosis Date  . Family history of breast cancer   . Family history of lung cancer   . Family history of throat cancer   . High cholesterol   . Hypertension   . Kidney stone   . Kidney stones   . UTI (lower urinary tract infection)     PAST SURGICAL HISTORY: Past Surgical History:  Procedure Laterality Date  . BREAST EXCISIONAL BIOPSY Left 1999,2005   cysts removed  . BREAST SURGERY Left    cyst and biopsy  . PORTACATH PLACEMENT Left 03/10/2018   Procedure: INSERTION PORT-A-CATH;  Surgeon: Stark Klein, MD;  Location: [facility];  Service: General;  Laterality: Left;    FAMILY HISTORY Family  History  Problem Relation Age of Onset  . Hypertension Mother   . Heart disease Mother   . Dementia Mother   . Healthy Father   . Colon cancer Maternal Grandmother   . Cancer Paternal Grandmother         unk type  . Lung cancer Cousin        60s  . Breast cancer Cousin        71s  . Breast cancer Cousin        40s  . Breast cancer Cousin   . Cancer Paternal Aunt        unk type d. 48, possibly pancreatic  . Cancer Paternal Aunt        unk type d. 14s  . Cancer Paternal Uncle        unk type   . Cancer Paternal Uncle        unk type d. 52s  . Cancer Maternal Aunt        unk type, d. 73s  . Throat cancer Maternal Uncle        d. 8s  . Ovarian cancer Neg Hx    Patient father is alive at 61 years old. Patient mother died from heart disease and dementia at age 16.  The patient denies a family hx of ovarian cancer. She has 3 siblings, 1 brother and 2 sisters. She has a maternal cousin diagnosed with lung cancer in her 39s. She has 3 paternal cousins with breast cancer, one was diagnosed in her 55s and has passed away.  GYNECOLOGIC HISTORY:  No LMP recorded. Patient is postmenopausal. Menarche: 68 years old Age at first live birth: n/a GX P 0 LMP 2010 Contraceptive n/a HRT no  Hysterectomy? no BSo? no   SOCIAL HISTORY: She is single and works as a Scientist, water quality at [employer]). She lives alone, with no pets.      ADVANCED DIRECTIVES: Not in place.  At the 03/03/2018 visit she was given the appropriate documents to complete and notarize at her discretion.  She is planning to name her niece, [name], as her HCPOA.   HEALTH MAINTENANCE: Social History   Tobacco Use  . Smoking status: Never Smoker  . Smokeless tobacco: Never Used  Substance Use Topics  . Alcohol use: Yes    Comment: seldom  . Drug use: No     Colonoscopy: 2014? [facility]  PAP: 02/02/2018  Bone density: 03/15/2016, T-score 0.4, Dr. Alden Hipp   No Known Allergies  Current Outpatient Medications  Medication Sig Dispense Refill  . amLODipine (NORVASC) 10 MG tablet Take 10 mg by mouth every morning.     Marland Kitchen aspirin 81 MG chewable tablet Chew 81 mg by mouth  daily.    Marland Kitchen dexamethasone (DECADRON) 4 MG tablet Take 2 tablets by mouth twice a day for three days starting the day after chemotherapy. Take with food. 30 tablet 1  . lidocaine-prilocaine (EMLA) cream Apply 1 application topically as needed. 30 g 0  . LORazepam (ATIVAN) 0.5 MG tablet Take 1 tablet (0.5 mg total) by mouth every 6 (six) hours as needed (Nausea or vomiting). 30 tablet 0  . losartan (COZAAR) 100 MG tablet Take 100 mg by mouth every morning.     Marland Kitchen omeprazole (PRILOSEC) 40 MG capsule Take 1 capsule (40 mg total) by mouth daily. 30 capsule 5  . oxyCODONE (OXY IR/ROXICODONE) 5 MG immediate release tablet Take 1 tablet (5 mg total) by mouth every  6 (six) hours as needed for severe pain. 10 tablet 0  . potassium chloride SA (K-DUR,KLOR-CON) 20 MEQ tablet Take 1 tablet (20 mEq total) by mouth daily. 5 tablet 0  . pravastatin (PRAVACHOL) 20 MG tablet Take 20 mg by mouth every morning.     . prochlorperazine (COMPAZINE) 10 MG tablet Take 1 tablet (10 mg total) by mouth every 6 (six) hours as needed (Nausea or vomiting). 30 tablet 1   No current facility-administered medications for this visit.     OBJECTIVE:  Vitals:   04/13/18 1303  BP: (!) 141/77  Pulse: 79  Resp: 18  Temp: 98 F (36.7 C)  SpO2: 100%     Body mass index is 32.4 kg/m.   Wt Readings from Last 3 Encounters:  04/13/18 177 lb 2 oz (80.3 kg)  04/06/18 174 lb 1.6 oz (79 kg)  03/30/18 177 lb (80.3 kg)  ECOG FS:1 GENERAL: Patient is a well appearing female in no acute distress HEENT:  Sclerae anicteric.  Oropharynx clear and moist. No ulcerations or evidence of oropharyngeal candidiasis. Neck is supple.  NODES:  No cervical, supraclavicular, or axillary lymphadenopathy palpated.  BREAST EXAM:  ddeferred LUNGS:  Clear to auscultation bilaterally.  No wheezes or rhonchi. HEART:  Regular rate and rhythm. No murmur appreciated. ABDOMEN:  Soft, nontender.  Positive, normoactive bowel sounds. No organomegaly  palpated. MSK:  No focal spinal tenderness to palpation. Full range of motion bilaterally in the upper extremities. EXTREMITIES:  No peripheral edema.   SKIN:  Clear with no obvious rashes or skin changes. No nail dyscrasia. NEURO:  Nonfocal. Well oriented.  Appropriate affect.     LAB RESULTS:  CMP     Component Value Date/Time   NA 144 04/06/2018 0906   K 3.2 (L) 04/06/2018 0906   CL 108 04/06/2018 0906   CO2 26 04/06/2018 0906   GLUCOSE 124 (H) 04/06/2018 0906   BUN 22 04/06/2018 0906   CREATININE 0.76 04/06/2018 0906   CALCIUM 9.1 04/06/2018 0906   PROT 6.4 (L) 04/06/2018 0906   ALBUMIN 3.6 04/06/2018 0906   AST 9 (L) 04/06/2018 0906   ALT 23 04/06/2018 0906   ALKPHOS 140 (H) 04/06/2018 0906   BILITOT 0.4 04/06/2018 0906   GFRNONAA >60 04/06/2018 0906   GFRAA >60 04/06/2018 0906    No results found for: TOTALPROTELP, ALBUMINELP, A1GS, A2GS, BETS, BETA2SER, GAMS, MSPIKE, SPEI  No results found for: KPAFRELGTCHN, LAMBDASER, KAPLAMBRATIO  Lab Results  Component Value Date   WBC 12.0 (H) 04/13/2018   NEUTROABS PENDING 04/13/2018   HGB 10.0 (L) 04/13/2018   HCT 32.6 (L) 04/13/2018   MCV 91.3 04/13/2018   PLT 149 (L) 04/13/2018    _0 @  No results found for: LABCA2  No components found for: QIHKVQ259  No results for input(s): INR in the last 168 hours.  No results found for: LABCA2  No results found for: DGL875  No results found for: IEP329  No results found for: JJO841  No results found for: CA2729  No components found for: HGQUANT  No results found for: CEA1 / No results found for: CEA1   No results found for: AFPTUMOR  No results found for: CHROMOGRNA  No results found for: PSA1  Appointment on 04/13/2018  Component Date Value Ref Range Status  . WBC Count 04/13/2018 12.0* 4.0 - 10.5 K/uL Final  . RBC 04/13/2018 3.57* 3.87 - 5.11 MIL/uL Final  . Hemoglobin 04/13/2018 10.0* 12.0 - 15.0 g/dL Final  .  HCT 04/13/2018 32.6* 36.0 -  46.0 % Final  . MCV 04/13/2018 91.3  80.0 - 100.0 fL Final  . MCH 04/13/2018 28.0  26.0 - 34.0 pg Final  . MCHC 04/13/2018 30.7  30.0 - 36.0 g/dL Final  . RDW 04/13/2018 14.5  11.5 - 15.5 % Final  . Platelet Count 04/13/2018 149* 150 - 400 K/uL Final  . nRBC 04/13/2018 0.7* 0.0 - 0.2 % Final   Performed at [facility], [place] [address], [place], [place] [address]  . Neutrophils Relative % 04/13/2018 PENDING  % Incomplete  . Neutro Abs 04/13/2018 PENDING  1.7 - 7.7 K/uL Incomplete  . Band Neutrophils 04/13/2018 PENDING  % Incomplete  . Lymphocytes Relative 04/13/2018 PENDING  % Incomplete  . Lymphs Abs 04/13/2018 PENDING  0.7 - 4.0 K/uL Incomplete  . Monocytes Relative 04/13/2018 PENDING  % Incomplete  . Monocytes Absolute 04/13/2018 PENDING  0.1 - 1.0 K/uL Incomplete  . Eosinophils Relative 04/13/2018 PENDING  % Incomplete  . Eosinophils Absolute 04/13/2018 PENDING  0.0 - 0.5 K/uL Incomplete  . Basophils Relative 04/13/2018 PENDING  % Incomplete  . Basophils Absolute 04/13/2018 PENDING  0.0 - 0.1 K/uL Incomplete  . WBC Morphology 04/13/2018 PENDING   Incomplete  . RBC Morphology 04/13/2018 PENDING   Incomplete  . Smear Review 04/13/2018 PENDING   Incomplete  . Other 04/13/2018 PENDING  % Incomplete  . nRBC 04/13/2018 PENDING  0 /100 WBC Incomplete  . Metamyelocytes Relative 04/13/2018 PENDING  % Incomplete  . Myelocytes 04/13/2018 PENDING  % Incomplete  . Promyelocytes Relative 04/13/2018 PENDING  % Incomplete  . Blasts 04/13/2018 PENDING  % Incomplete  Appointment on 04/12/2018  Component Date Value Ref Range Status  . Color, Urine 04/12/2018 YELLOW  YELLOW Final  . APPearance 04/12/2018 CLEAR  CLEAR Final  . Specific Gravity, Urine 04/12/2018 1.020  1.005 - 1.030 Final  . pH 04/12/2018 5.0  5.0 - 8.0 Final  . Glucose, UA 04/12/2018 NEGATIVE  NEGATIVE mg/dL Final  . Hgb urine dipstick 04/12/2018 NEGATIVE  NEGATIVE Final  . Bilirubin Urine 04/12/2018  NEGATIVE  NEGATIVE Final  . Ketones, ur 04/12/2018 NEGATIVE  NEGATIVE mg/dL Final  . Protein, ur 04/12/2018 NEGATIVE  NEGATIVE mg/dL Final  . Nitrite 04/12/2018 NEGATIVE  NEGATIVE Final  . Leukocytes,Ua 04/12/2018 TRACE* NEGATIVE Final  . RBC / HPF 04/12/2018 6-10  0 - 5 RBC/hpf Final  . WBC, UA 04/12/2018 21-50  0 - 5 WBC/hpf Final  . Bacteria, UA 04/12/2018 NONE SEEN  NONE SEEN Final  . Squamous Epithelial / LPF 04/12/2018 0-5  0 - 5 Final  . Mucus 04/12/2018 PRESENT   Final   Performed at [facility], [place] [address] [place], [place] [address]    (this displays the last labs from the last 3 days)  No results found for: TOTALPROTELP, ALBUMINELP, A1GS, A2GS, BETS, BETA2SER, GAMS, MSPIKE, SPEI (this displays SPEP labs)  No results found for: KPAFRELGTCHN, LAMBDASER, KAPLAMBRATIO (kappa/lambda light chains)  No results found for: HGBA, HGBA2QUANT, HGBFQUANT, HGBSQUAN (Hemoglobinopathy evaluation)   No results found for: LDH  No results found for: IRON, TIBC, IRONPCTSAT (Iron and TIBC)  No results found for: FERRITIN  Urinalysis    Component Value Date/Time   COLORURINE YELLOW 04/12/2018 [place] 04/12/2018 1504   LABSPEC 1.020 04/12/2018 1504   PHURINE 5.0 04/12/2018 [place] 04/12/2018 [place] 04/12/2018 [place] 04/12/2018 1504  KETONESUR NEGATIVE 04/12/2018 1504   PROTEINUR NEGATIVE 04/12/2018 1504   UROBILINOGEN 0.2 07/07/2012 1159   NITRITE NEGATIVE 04/12/2018 1504   LEUKOCYTESUR TRACE (A) 04/12/2018 1504     STUDIES: US Breast Ltd Uni Right Inc Axilla  Result Date: 03/16/2018 CLINICAL DATA:  5 mm slightly irregular enhancing mass with washout kinetics in the anterior 3rd of the medial right breast in the 3 o'clock region. Recently diagnosed left breast cancer. EXAM: ULTRASOUND OF THE RIGHT BREAST COMPARISON:  Previous examinations, including the bilateral breast MRI dated  03/11/2018. FINDINGS: On physical exam, no mass is palpable in the medial right breast or right axilla. Targeted ultrasound is performed, showing a 5 x 4 x 3 mm mildly irregular hypoechoic mass with low-level internal echoes in the 3 o'clock position of the right breast, 2 cm from the nipple. The ultrasound images are incorrectly labeled 9 o'clock. No internal blood flow was seen with power Doppler. Ultrasound of the right axilla demonstrated normal appearing right axillary lymph nodes. IMPRESSION: 5 mm indeterminate mass in the 3 o'clock position of the right breast, corresponding to the 5 mm indeterminate mass seen on the MRI. RECOMMENDATION: Ultrasound-guided core needle biopsy of the 5 mm mass in the 3 o'clock position of the right breast. This has been discussed with the patient and is scheduled to follow. I have discussed the findings and recommendations with the patient. Results were also provided in writing at the conclusion of the visit. If applicable, a reminder letter will be sent to the patient regarding the next appointment. BI-RADS CATEGORY  4: Suspicious. Electronically Signed   By: Claudie Revering M.D.   On: 03/16/2018 09:54   Mm Clip Placement Right  Addendum Date: 03/16/2018   ADDENDUM REPORT: 03/16/2018 17:12 ADDENDUM: The clinical data should read as follows: Status post ultrasound-guided core needle biopsy of a 5 mm mass in the 3 o'clock position of the right breast. Electronically Signed   By: Claudie Revering M.D.   On: 03/16/2018 17:12   Result Date: 03/16/2018 CLINICAL DATA:  Status post MR guided core needle biopsy of a 5 mm mass in the 3 o'clock position of the right breast. EXAM: DIAGNOSTIC RIGHT MAMMOGRAM POST ULTRASOUND BIOPSY COMPARISON:  Previous exam(s). FINDINGS: Mammographic images were obtained following ultrasound guided biopsy of a 5 mm mass in the 3 o'clock position of the right breast. These demonstrate a ribbon shaped biopsy marker clip at the expected location of the biopsied  mass. IMPRESSION: Appropriate clip deployment following right breast ultrasound-guided core needle biopsy. Final Assessment: Post Procedure Mammograms for Marker Placement Electronically Signed: By: Claudie Revering M.D. 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 This was found to be concordant by Dr. Claudie Revering, with excision recommended. Pathology results were discussed with the patient by telephone. The patient reported doing well after the biopsy with tenderness at the site. Post biopsy instructions and care were reviewed and questions were answered. The patient was encouraged to call [facility] for any additional concerns. The patient has a recent diagnosis of LEFT breast cancer and should follow her outlined treatment plan. Dr. Stark Klein was notified of biopsy results via EPIC message on March 18, 2018. Pathology results reported by Terie Purser, RN on 03/18/2018. Electronically Signed   By: Claudie Revering  M.D.   On: 03/18/2018 06:48   Result Date: 03/18/2018 CLINICAL DATA:  5 mm indeterminate mass in the 3 o'clock position of the right breast at recent MRI and ultrasound. Recently diagnosed left breast cancer. EXAM: ULTRASOUND GUIDED RIGHT BREAST CORE NEEDLE BIOPSY COMPARISON:  Previous exam(s). FINDINGS: I met with the patient and we discussed the procedure of ultrasound-guided biopsy, including benefits and alternatives. We discussed the high likelihood of a successful procedure. We discussed the risks of the procedure, including infection, bleeding, tissue injury, clip migration, and inadequate sampling. Informed written consent was given. The usual time-out protocol was performed immediately prior to the procedure. Using sterile technique and 1% Lidocaine as local anesthetic, under direct  ultrasound visualization, a 12 gauge spring-loaded device was used to perform biopsy of the recently demonstrated 5 mm mass in the 3 o'clock position of the right breast using a caudal approach. At the conclusion of the procedure a ribbon shaped tissue marker clip was deployed into the biopsy cavity. Follow up 2 view mammogram was performed and dictated separately. IMPRESSION: Ultrasound guided biopsy of the recently demonstrated 5 mm mass in the 3 o'clock position of the right breast. No apparent complications. Electronically Signed: By: Claudie Revering M.D. On: 03/16/2018 09:55    ELIGIBLE FOR AVAILABLE RESEARCH PROTOCOL: Upbeat  ASSESSMENT: 68 y.o. 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, grade 2, estrogen and progesterone receptor positive, HER-2 not amplified, with an MIB-1 of 5%  (b) in the lower outer quadrant a clinical T1c N0, stage IA-B invasive ductal carcinoma, grade 2, estrogen receptor only moderately positive at 10%, progesterone receptor negative, with an MIB-1 of 40%, and HER-2 not amplified  (1) neoadjuvant chemotherapy will consist of doxorubicin and cyclophosphamide in dose dense fashion x4 starting 03/16/2018, followed by paclitaxel and carboplatin weekly x12  (a)echo on 03/11/2018 shows well preserved EF of 60-65%  (2) definitive surgery to follow  (3) adjuvant radiation as appropriate  (4) antiestrogens to follow at the completion of local treatment (for upper outer quadrant tumor)  (5) genetics testing on 03/24/2018 showed no pathogenic mutations.  Genes tested include:  APC, ATM, AXIN2, BARD1, BMPR1A, BRCA1, BRCA2, BRIP1, CDH1, CDKN2A (p14ARF), CDKN2A (p16INK4a), CKD4, CHEK2, CTNNA1, DICER1, EPCAM (Deletion/duplication testing only), GREM1 (promoter region deletion/duplication testing only), KIT, MEN1, MLH1, MSH2, MSH3, MSH6, MUTYH, NBN, NF1, NHTL1,  PALB2, PDGFRA, PMS2, POLD1, POLE, PTEN, RAD50, RAD51C, RAD51D, SDHB, SDHC, SDHD, SMAD4, SMARCA4. STK11, TP53, TSC1, TSC2, and VHL.  The following genes were evaluated for sequence changes only: SDHA and HOXB13 c.251G>A variant only.  PLAN:  Dakia is doing moderately well today.  Her CBC remains stable and she will proceed with her third cycle of neoadjuvant Doxorubicin and Cyclophsopahmide (so long as pending CMET results within parameters).  She has anti nausea medication to last throughout the next week.    Sonal's urine is concerning for acute UTI.  I have sent in Cipro 579m PO BID. Culture is pending.  She knows to let me know if she doesn't tolerate the antibiotic, or if she develops a yeast infection from it.    [place] return on Thursday for Udenyca, and in 1 week for labs and f/u.  She knows to call for any questions or concerns prior to her next appointment with u[place]    A total of (20) minutes of face-to-face time was spent with this patient  with greater than 50% of that time in counseling and care-coordination.   Scot Dock, NP   04/13/2018 1:09 PM Medical Oncology and Hematology [facility] [address] [place], [place] [address] Tel. [phone]    Fax. [phone]

## 2018-04-13 NOTE — Patient Instructions (Signed)
[facility] Discharge Instructions for Patients Receiving Chemotherapy  Today you received the following chemotherapy agents Adriamycin, Cytoxan.  To help prevent nausea and vomiting after your treatment, we encourage you to take your nausea medication as prescribed.   If you develop nausea and vomiting that is not controlled by your nausea medication, call the clinic.   BELOW ARE SYMPTOMS THAT SHOULD BE REPORTED IMMEDIATELY:  *FEVER GREATER THAN 100.5 F  *CHILLS WITH OR WITHOUT FEVER  NAUSEA AND VOMITING THAT IS NOT CONTROLLED WITH YOUR NAUSEA MEDICATION  *UNUSUAL SHORTNESS OF BREATH  *UNUSUAL BRUISING OR BLEEDING  TENDERNESS IN MOUTH AND THROAT WITH OR WITHOUT PRESENCE OF ULCERS  *URINARY PROBLEMS  *BOWEL PROBLEMS  UNUSUAL RASH Items with * indicate a potential emergency and should be followed up as soon as possible.  Feel free to call the clinic should you have any questions or concerns. The clinic phone number is [phone].  Please show the CHEMO ALERT CARD at check-in to the Emergency Department and triage nurse.   

## 2018-04-15 ENCOUNTER — Inpatient Hospital Stay: Payer: Medicare Other

## 2018-04-15 VITALS — BP 143/77 | HR 86 | Resp 18

## 2018-04-15 DIAGNOSIS — Z171 Estrogen receptor negative status [ER-]: Principal | ICD-10-CM

## 2018-04-15 DIAGNOSIS — Z170 Estrogen receptor positive status [ER+]: Secondary | ICD-10-CM

## 2018-04-15 DIAGNOSIS — Z5111 Encounter for antineoplastic chemotherapy: Secondary | ICD-10-CM | POA: Diagnosis not present

## 2018-04-15 DIAGNOSIS — C50512 Malignant neoplasm of lower-outer quadrant of left female breast: Secondary | ICD-10-CM

## 2018-04-15 DIAGNOSIS — C50412 Malignant neoplasm of upper-outer quadrant of left female breast: Secondary | ICD-10-CM

## 2018-04-15 MED ORDER — PEGFILGRASTIM-CBQV 6 MG/0.6ML ~~LOC~~ SOSY
6.0000 mg | PREFILLED_SYRINGE | Freq: Once | SUBCUTANEOUS | Status: AC
  Administered 2018-04-15: 6 mg via SUBCUTANEOUS

## 2018-04-15 MED ORDER — PEGFILGRASTIM-CBQV 6 MG/0.6ML ~~LOC~~ SOSY
PREFILLED_SYRINGE | SUBCUTANEOUS | Status: AC
  Filled 2018-04-15: qty 0.6

## 2018-04-16 ENCOUNTER — Other Ambulatory Visit: Payer: Self-pay | Admitting: Adult Health

## 2018-04-16 DIAGNOSIS — C50412 Malignant neoplasm of upper-outer quadrant of left female breast: Secondary | ICD-10-CM

## 2018-04-16 DIAGNOSIS — Z171 Estrogen receptor negative status [ER-]: Principal | ICD-10-CM

## 2018-04-16 DIAGNOSIS — Z170 Estrogen receptor positive status [ER+]: Secondary | ICD-10-CM

## 2018-04-16 DIAGNOSIS — C50512 Malignant neoplasm of lower-outer quadrant of left female breast: Secondary | ICD-10-CM

## 2018-04-20 ENCOUNTER — Inpatient Hospital Stay: Payer: Medicare Other

## 2018-04-20 ENCOUNTER — Encounter: Payer: Self-pay | Admitting: Adult Health

## 2018-04-20 ENCOUNTER — Inpatient Hospital Stay (HOSPITAL_BASED_OUTPATIENT_CLINIC_OR_DEPARTMENT_OTHER): Payer: Medicare Other | Admitting: Adult Health

## 2018-04-20 VITALS — BP 127/74 | HR 70 | Temp 98.0°F | Resp 20 | Ht 62.0 in | Wt 176.7 lb

## 2018-04-20 VITALS — BP 100/59 | HR 81 | Temp 98.0°F | Resp 18 | Ht 62.0 in | Wt 175.2 lb

## 2018-04-20 DIAGNOSIS — Z171 Estrogen receptor negative status [ER-]: Secondary | ICD-10-CM

## 2018-04-20 DIAGNOSIS — C50512 Malignant neoplasm of lower-outer quadrant of left female breast: Secondary | ICD-10-CM | POA: Diagnosis not present

## 2018-04-20 DIAGNOSIS — E860 Dehydration: Secondary | ICD-10-CM

## 2018-04-20 DIAGNOSIS — C50412 Malignant neoplasm of upper-outer quadrant of left female breast: Secondary | ICD-10-CM | POA: Diagnosis not present

## 2018-04-20 DIAGNOSIS — Z170 Estrogen receptor positive status [ER+]: Secondary | ICD-10-CM | POA: Diagnosis not present

## 2018-04-20 DIAGNOSIS — Z79899 Other long term (current) drug therapy: Secondary | ICD-10-CM

## 2018-04-20 DIAGNOSIS — Z95828 Presence of other vascular implants and grafts: Secondary | ICD-10-CM

## 2018-04-20 DIAGNOSIS — Z5111 Encounter for antineoplastic chemotherapy: Secondary | ICD-10-CM | POA: Diagnosis not present

## 2018-04-20 LAB — CBC WITH DIFFERENTIAL (CANCER CENTER ONLY)
Abs Immature Granulocytes: 0.15 10*3/uL — ABNORMAL HIGH (ref 0.00–0.07)
Basophils Absolute: 0.1 10*3/uL (ref 0.0–0.1)
Basophils Relative: 1 %
Eosinophils Absolute: 0.1 10*3/uL (ref 0.0–0.5)
Eosinophils Relative: 2 %
HEMATOCRIT: 31 % — AB (ref 36.0–46.0)
Hemoglobin: 9.5 g/dL — ABNORMAL LOW (ref 12.0–15.0)
Immature Granulocytes: 2 %
Lymphocytes Relative: 13 %
Lymphs Abs: 1.2 10*3/uL (ref 0.7–4.0)
MCH: 27.2 pg (ref 26.0–34.0)
MCHC: 30.6 g/dL (ref 30.0–36.0)
MCV: 88.8 fL (ref 80.0–100.0)
MONOS PCT: 5 %
Monocytes Absolute: 0.5 10*3/uL (ref 0.1–1.0)
Neutro Abs: 6.7 10*3/uL (ref 1.7–7.7)
Neutrophils Relative %: 77 %
Platelet Count: 117 10*3/uL — ABNORMAL LOW (ref 150–400)
RBC: 3.49 MIL/uL — ABNORMAL LOW (ref 3.87–5.11)
RDW: 14.3 % (ref 11.5–15.5)
WBC Count: 8.7 10*3/uL (ref 4.0–10.5)
WBC Morphology: INCREASED
nRBC: 0 % (ref 0.0–0.2)

## 2018-04-20 LAB — CMP (CANCER CENTER ONLY)
ALT: 17 U/L (ref 0–44)
AST: 8 U/L — AB (ref 15–41)
Albumin: 3.5 g/dL (ref 3.5–5.0)
Alkaline Phosphatase: 133 U/L — ABNORMAL HIGH (ref 38–126)
Anion gap: 9 (ref 5–15)
BUN: 15 mg/dL (ref 8–23)
CO2: 28 mmol/L (ref 22–32)
Calcium: 9.3 mg/dL (ref 8.9–10.3)
Chloride: 106 mmol/L (ref 98–111)
Creatinine: 0.72 mg/dL (ref 0.44–1.00)
GFR, Est AFR Am: >60 mL/min (ref >60)
GFR, Estimated: >60 mL/min (ref >60)
Glucose, Bld: 112 mg/dL — ABNORMAL HIGH (ref 70–99)
Potassium: 3.8 mmol/L (ref 3.5–5.1)
Sodium: 143 mmol/L (ref 135–145)
Total Bilirubin: 0.4 mg/dL (ref 0.3–1.2)
Total Protein: 6.1 g/dL — ABNORMAL LOW (ref 6.5–8.1)

## 2018-04-20 MED ORDER — HEPARIN SOD (PORK) LOCK FLUSH 100 UNIT/ML IV SOLN
500.0000 [IU] | Freq: Once | INTRAVENOUS | Status: AC
  Administered 2018-04-20: 500 [IU]
  Filled 2018-04-20: qty 5

## 2018-04-20 MED ORDER — SODIUM CHLORIDE 0.9% FLUSH
10.0000 mL | Freq: Once | INTRAVENOUS | Status: AC
  Administered 2018-04-20: 10 mL
  Filled 2018-04-20: qty 10

## 2018-04-20 MED ORDER — SODIUM CHLORIDE 0.9 % IV SOLN
INTRAVENOUS | Status: DC
  Administered 2018-04-20: 13:00:00 via INTRAVENOUS
  Filled 2018-04-20 (×3): qty 250

## 2018-04-20 NOTE — Patient Instructions (Signed)
Dehydration, Adult  Dehydration is when there is not enough fluid or water in your body. This happens when you lose more fluids than you take in. Dehydration can range from mild to very bad. It should be treated right away to keep it from getting very bad. Symptoms of mild dehydration may include:  Thirst.  Dry lips.  Slightly dry mouth.  Dry, warm skin.  Dizziness. Symptoms of moderate dehydration may include:  Very dry mouth.  Muscle cramps.  Dark pee (urine). Pee may be the color of tea.  Your body making less pee.  Your eyes making fewer tears.  Heartbeat that is uneven or faster than normal (palpitations).  Headache.  Light-headedness, especially when you stand up from sitting.  Fainting (syncope). Symptoms of very bad dehydration may include:  Changes in skin, such as: ? Cold and clammy skin. ? Blotchy (mottled) or pale skin. ? Skin that does not quickly return to normal after being lightly pinched and let go (poor skin turgor).  Changes in body fluids, such as: ? Feeling very thirsty. ? Your eyes making fewer tears. ? Not sweating when body temperature is high, such as in hot weather. ? Your body making very little pee.  Changes in vital signs, such as: ? Weak pulse. ? Pulse that is more than 100 beats a minute when you are sitting still. ? Fast breathing. ? Low blood pressure.  Other changes, such as: ? Sunken eyes. ? Cold hands and feet. ? Confusion. ? Lack of energy (lethargy). ? Trouble waking up from sleep. ? Short-term weight loss. ? Unconsciousness. Follow these instructions at home:   If told by your doctor, drink an ORS: ? Make an ORS by using instructions on the package. ? Start by drinking small amounts, about  cup (120 mL) every 5-10 minutes. ? Slowly drink more until you have had the amount that your doctor said to have.  Drink enough clear fluid to keep your pee clear or pale yellow. If you were told to drink an ORS, finish the  ORS first, then start slowly drinking clear fluids. Drink fluids such as: ? Water. Do not drink only water by itself. Doing that can make the salt (sodium) level in your body get too low (hyponatremia). ? Ice chips. ? Fruit juice that you have added water to (diluted). ? Low-calorie sports drinks.  Avoid: ? Alcohol. ? Drinks that have a lot of sugar. These include high-calorie sports drinks, fruit juice that does not have water added, and soda. ? Caffeine. ? Foods that are greasy or have a lot of fat or sugar.  Take over-the-counter and prescription medicines only as told by your doctor.  Do not take salt tablets. Doing that can make the salt level in your body get too high (hypernatremia).  Eat foods that have minerals (electrolytes). Examples include bananas, oranges, potatoes, tomatoes, and spinach.  Keep all follow-up visits as told by your doctor. This is important. Contact a doctor if:  You have belly (abdominal) pain that: ? Gets worse. ? Stays in one area (localizes).  You have a rash.  You have a stiff neck.  You get angry or annoyed more easily than normal (irritability).  You are more sleepy than normal.  You have a harder time waking up than normal.  You feel: ? Weak. ? Dizzy. ? Very thirsty.  You have peed (urinated) only a small amount of very dark pee during 6-8 hours. Get help right away if:  You have   symptoms of very bad dehydration.  You cannot drink fluids without throwing up (vomiting).  Your symptoms get worse with treatment.  You have a fever.  You have a very bad headache.  You are throwing up or having watery poop (diarrhea) and it: ? Gets worse. ? Does not go away.  You have blood or something green (bile) in your throw-up.  You have blood in your poop (stool). This may cause poop to look black and tarry.  You have not peed in 6-8 hours.  You pass out (faint).  Your heart rate when you are sitting still is more than 100 beats a  minute.  You have trouble breathing. This information is not intended to replace advice given to you by your health care provider. Make sure you discuss any questions you have with your health care provider. Document Released: 12/07/2008 Document Revised: 08/31/2015 Document Reviewed: 04/06/2015 Elsevier Interactive Patient Education  2019 Elsevier Inc.  

## 2018-04-20 NOTE — Progress Notes (Signed)
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 She underwent bilateral diagnostic mammography with tomography and left breast ultrasonography at [facility] on 02/16/2018 showing: breast density category C; two left breast masses, one at 2 o'clock and the other at 3:30 o'clock. The 2 o'clock mass (1.3 x 1 x 1 cm) corresponds to the mammographic abnormality and is highly suspicious for breast carcinoma. The other mass (1.2 x 0.7 x 1.2 cm)  is suspicious for breast carcinoma. No left axillary adenopathy.   Accordingly on 02/19/2018 she proceeded to biopsy of the left breast area in question. 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 Prognostic indicators significant for: estrogen receptor, 90% positive and progesterone receptor, 95% positive, both with strong staining intensity. Proliferation marker Ki67 at 5%. HER2 negative (1+).   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 Prognostic indicators significant for: estrogen receptor, 10% positive with moderate staining intensity and progesterone receptor, 0% negative. Proliferation marker Ki67  at 40%. HER2 negative (1+).  The patient's subsequent history is as detailed below.   INTERVAL HISTORY: Emily Chambers is here today for follow up of her estrogen positive and estrogen negative breast cancer prior to receiving treatment with neoadjuvant chemotherapy on Wednesday, 03/17/2018.  She will be receiving 4 cycles of neoadjuvant Doxorubicin and Cyclophosphamide with Udenyca support given once every 2 weeks, followed by weekly Paclitaxel and Carboplatin x 12 weeks.  Today is cycle 3 day 8 of Doxorubicin and Cyclophosphamide.   She is doing well today.    REVIEW OF SYSTEMS: Emily Chambers is doing moderately well.  She notes that she completed cipro for a uti.  She does have some decreased intake of fluids.  Her bp is slightly low today. She had some mild nausea following chemtoherapy, but no vomiting. She does note some dizziness with position changes at times.  Otherwise, she is doing well.  She has had no bowel/bladder changes.  She is without fevers, chills, chest pain, palpitations, cough, shortness of breath, or any other concerns.  A detailed ROS was otherwise non contributory.   PAST MEDICAL HISTORY: Past Medical History:  Diagnosis Date  . Family history of breast cancer   . Family history of lung cancer   . Family history of throat cancer   . High cholesterol   . Hypertension   . Kidney stone   . Kidney stones   . UTI (lower urinary tract infection)     PAST SURGICAL HISTORY: Past Surgical History:  Procedure Laterality Date  . BREAST EXCISIONAL BIOPSY Left 1999,2005   cysts removed  . BREAST SURGERY Left    cyst and biopsy  . PORTACATH PLACEMENT Left 03/10/2018   Procedure: INSERTION PORT-A-CATH;  Surgeon: Stark Klein, MD;  Location: [facility];  Service: General;  Laterality: Left;  FAMILY HISTORY Family History  Problem Relation Age of Onset  . Hypertension Mother   . Heart disease Mother   . Dementia Mother   . Healthy Father   . Colon cancer  Maternal Grandmother   . Cancer Paternal Grandmother        unk type  . Lung cancer Cousin        98s  . Breast cancer Cousin        35s  . Breast cancer Cousin        11s  . Breast cancer Cousin   . Cancer Paternal Aunt        unk type d. 51, possibly pancreatic  . Cancer Paternal Aunt        unk type d. 17s  . Cancer Paternal Uncle        unk type   . Cancer Paternal Uncle        unk type d. 71s  . Cancer Maternal Aunt        unk type, d. 21s  . Throat cancer Maternal Uncle        d. 80s  . Ovarian cancer Neg Hx    Patient father is alive at 40 years old. Patient mother died from heart disease and dementia at age 55.  The patient denies a family hx of ovarian cancer. She has 3 siblings, 1 brother and 2 sisters. She has a maternal cousin diagnosed with lung cancer in her 94s. She has 3 paternal cousins with breast cancer, one was diagnosed in her 77s and has passed away.  GYNECOLOGIC HISTORY:  No LMP recorded. Patient is postmenopausal. Menarche: 68 years old Age at first live birth: n/a GX P 0 LMP 2010 Contraceptive n/a HRT no  Hysterectomy? no BSo? no   SOCIAL HISTORY: She is single and works as a Scientist, water quality at [employer]). She lives alone, with no pets.      ADVANCED DIRECTIVES: Not in place.  At the 03/03/2018 visit she was given the appropriate documents to complete and notarize at her discretion.  She is planning to name her niece, Emily Chambers, as her HCPOA.   HEALTH MAINTENANCE: Social History   Tobacco Use  . Smoking status: Never Smoker  . Smokeless tobacco: Never Used  Substance Use Topics  . Alcohol use: Yes    Comment: seldom  . Drug use: No     Colonoscopy: 2014? [facility]  PAP: 02/02/2018  Bone density: 03/15/2016, T-score 0.4, Dr. Alden Hipp   No Known Allergies  Current Outpatient Medications  Medication Sig Dispense Refill  . amLODipine (NORVASC) 10 MG tablet Take 10 mg by mouth every morning.       Marland Kitchen aspirin 81 MG chewable tablet Chew 81 mg by mouth daily.    . ciprofloxacin (CIPRO) 500 MG tablet Take 1 tablet (500 mg total) by mouth 2 (two) times daily. 10 tablet 0  . dexamethasone (DECADRON) 4 MG tablet Take 2 tablets by mouth twice a day for three days starting the day after chemotherapy. Take with food. 30 tablet 1  . lidocaine-prilocaine (EMLA) cream Apply 1 application topically as needed. 30 g 0  . LORazepam (ATIVAN) 0.5 MG tablet Take 1 tablet (0.5 mg total) by mouth every 6 (six) hours as needed (Nausea or vomiting). 30 tablet 0  . losartan (COZAAR) 100 MG tablet Take 100 mg by mouth every morning.     Marland Kitchen omeprazole (PRILOSEC) 40 MG capsule Take 1 capsule (40 mg  total) by mouth daily. 30 capsule 5  . oxyCODONE (OXY IR/ROXICODONE) 5 MG immediate release tablet Take 1 tablet (5 mg total) by mouth every 6 (six) hours as needed for severe pain. 10 tablet 0  . potassium chloride SA (K-DUR,KLOR-CON) 20 MEQ tablet Take 1 tablet (20 mEq total) by mouth daily. 5 tablet 0  . pravastatin (PRAVACHOL) 20 MG tablet Take 20 mg by mouth every morning.     . prochlorperazine (COMPAZINE) 10 MG tablet Take 1 tablet (10 mg total) by mouth every 6 (six) hours as needed (Nausea or vomiting). 30 tablet 1   Current Facility-Administered Medications  Medication Dose Route Frequency Provider Last Rate Last Dose  . 0.9 %  sodium chloride infusion   Intravenous Continuous Gardenia Phlegm, NP   Stopped at 04/20/18 1403    OBJECTIVE:  Vitals:   04/20/18 1142  BP: (!) 100/59  Pulse: 81  Resp: 18  Temp: 98 F (36.7 C)  SpO2: 100%     Body mass index is 32.04 kg/m.   Wt Readings from Last 3 Encounters:  04/20/18 176 lb 11.2 oz (80.2 kg)  04/20/18 175 lb 3.2 oz (79.5 kg)  04/13/18 177 lb 2 oz (80.3 kg)  ECOG FS:1 GENERAL: Patient is a well appearing female in no acute distress HEENT:  Sclerae anicteric.  Oropharynx clear and moist. No ulcerations or evidence of oropharyngeal candidiasis.  Neck is supple.  NODES:  No cervical, supraclavicular, or axillary lymphadenopathy palpated.  BREAST EXAM:  deferred LUNGS:  Clear to auscultation bilaterally.  No wheezes or rhonchi. HEART:  Regular rate and rhythm. No murmur appreciated. ABDOMEN:  Soft, nontender.  Positive, normoactive bowel sounds. No organomegaly palpated. MSK:  No focal spinal tenderness to palpation. Full range of motion bilaterally in the upper extremities. EXTREMITIES:  No peripheral edema.   SKIN:  Clear with no obvious rashes or skin changes. No nail dyscrasia. NEURO:  Nonfocal. Well oriented.  Appropriate affect.     LAB RESULTS:  CMP     Component Value Date/Time   NA 143 04/20/2018 1116   K 3.8 04/20/2018 1116   CL 106 04/20/2018 1116   CO2 28 04/20/2018 1116   GLUCOSE 112 (H) 04/20/2018 1116   BUN 15 04/20/2018 1116   CREATININE 0.72 04/20/2018 1116   CALCIUM 9.3 04/20/2018 1116   PROT 6.1 (L) 04/20/2018 1116   ALBUMIN 3.5 04/20/2018 1116   AST 8 (L) 04/20/2018 1116   ALT 17 04/20/2018 1116   ALKPHOS 133 (H) 04/20/2018 1116   BILITOT 0.4 04/20/2018 1116   GFRNONAA >60 04/20/2018 1116   GFRAA >60 04/20/2018 1116    No results found for: TOTALPROTELP, ALBUMINELP, A1GS, A2GS, BETS, BETA2SER, GAMS, MSPIKE, SPEI  No results found for: KPAFRELGTCHN, LAMBDASER, KAPLAMBRATIO  Lab Results  Component Value Date   WBC 8.7 04/20/2018   NEUTROABS 6.7 04/20/2018   HGB 9.5 (L) 04/20/2018   HCT 31.0 (L) 04/20/2018   MCV 88.8 04/20/2018   PLT 117 (L) 04/20/2018    _0 @  No results found for: LABCA2  No components found for: [id number]  No results for input(s): INR in the last 168 hours.  No results found for: LABCA2  No results found for: IOE703  No results found for: JKK938  No results found for: HWE993  No results found for: CA2729  No components found for: HGQUANT  No results found for: CEA1 / No results found for: CEA1   No results found for: AFPTUMOR  No results  found for: CHROMOGRNA  No results found for: PSA1  Appointment on 04/20/2018  Component Date Value Ref Range Status  . Sodium 04/20/2018 143  135 - 145 mmol/L Final  . Potassium 04/20/2018 3.8  3.5 - 5.1 mmol/L Final  . Chloride 04/20/2018 106  98 - 111 mmol/L Final  . CO2 04/20/2018 28  22 - 32 mmol/L Final  . Glucose, Bld 04/20/2018 112* 70 - 99 mg/dL Final  . BUN 04/20/2018 15  8 - 23 mg/dL Final  . Creatinine 04/20/2018 0.72  0.44 - 1.00 mg/dL Final  . Calcium 04/20/2018 9.3  8.9 - 10.3 mg/dL Final  . Total Protein 04/20/2018 6.1* 6.5 - 8.1 g/dL Final  . Albumin 04/20/2018 3.5  3.5 - 5.0 g/dL Final  . AST 04/20/2018 8* 15 - 41 U/L Final  . ALT 04/20/2018 17  0 - 44 U/L Final  . Alkaline Phosphatase 04/20/2018 133* 38 - 126 U/L Final  . Total Bilirubin 04/20/2018 0.4  0.3 - 1.2 mg/dL Final  . GFR, Est Non Af Am 04/20/2018 >60  >60 mL/min Final  . GFR, Est AFR Am 04/20/2018 >60  >60 mL/min Final  . Anion gap 04/20/2018 9  5 - 15 Final   Performed at [facility], [place] [address], [place], [place] [address]  . WBC Count 04/20/2018 8.7  4.0 - 10.5 K/uL Final  . RBC 04/20/2018 3.49* 3.87 - 5.11 MIL/uL Final  . Hemoglobin 04/20/2018 9.5* 12.0 - 15.0 g/dL Final  . HCT 04/20/2018 31.0* 36.0 - 46.0 % Final  . MCV 04/20/2018 88.8  80.0 - 100.0 fL Final  . MCH 04/20/2018 27.2  26.0 - 34.0 pg Final  . MCHC 04/20/2018 30.6  30.0 - 36.0 g/dL Final  . RDW 04/20/2018 14.3  11.5 - 15.5 % Final  . Platelet Count 04/20/2018 117* 150 - 400 K/uL Final  . nRBC 04/20/2018 0.0  0.0 - 0.2 % Final  . Neutrophils Relative % 04/20/2018 77  % Final  . Neutro Abs 04/20/2018 6.7  1.7 - 7.7 K/uL Final  . Lymphocytes Relative 04/20/2018 13  % Final  . Lymphs Abs 04/20/2018 1.2  0.7 - 4.0 K/uL Final  . Monocytes Relative 04/20/2018 5  % Final  . Monocytes Absolute 04/20/2018 0.5  0.1 - 1.0 K/uL Final  . Eosinophils Relative 04/20/2018 2  % Final  . Eosinophils Absolute 04/20/2018  0.1  0.0 - 0.5 K/uL Final  . Basophils Relative 04/20/2018 1  % Final  . Basophils Absolute 04/20/2018 0.1  0.0 - 0.1 K/uL Final  . WBC Morphology 04/20/2018 INCREASED BANDS (>20% BANDS)   Corrected  . Smear Review 04/20/2018 Increased IG's, likely caused by Bone Marrow Colony Stimulating Factor received within 30 days.   Corrected  . Immature Granulocytes 04/20/2018 2  % Final  . Abs Immature Granulocytes 04/20/2018 0.15* 0.00 - 0.07 K/uL Final   Performed at [facility], [place] [address], [place], [place] [address]    (this displays the last labs from the last 3 days)  No results found for: TOTALPROTELP, ALBUMINELP, A1GS, A2GS, BETS, BETA2SER, GAMS, MSPIKE, SPEI (this displays SPEP labs)  No results found for: KPAFRELGTCHN, LAMBDASER, KAPLAMBRATIO (kappa/lambda light chains)  No results found for: HGBA, HGBA2QUANT, HGBFQUANT, HGBSQUAN (Hemoglobinopathy evaluation)   No results found for: LDH  No results found for: IRON, TIBC, IRONPCTSAT (Iron and TIBC)  No results found for: FERRITIN  Urinalysis    Component Value Date/Time   COLORURINE  YELLOW 04/12/2018 1504   APPEARANCEUR CLEAR 04/12/2018 1504   LABSPEC 1.020 04/12/2018 1504   PHURINE 5.0 04/12/2018 1504   GLUCOSEU NEGATIVE 04/12/2018 1504   HGBUR NEGATIVE 04/12/2018 1504   BILIRUBINUR NEGATIVE 04/12/2018 1504   KETONESUR NEGATIVE 04/12/2018 1504   PROTEINUR NEGATIVE 04/12/2018 1504   UROBILINOGEN 0.2 07/07/2012 1159   NITRITE NEGATIVE 04/12/2018 1504   LEUKOCYTESUR TRACE (A) 04/12/2018 1504     STUDIES: No results found.  ELIGIBLE FOR AVAILABLE RESEARCH PROTOCOL: Upbeat  ASSESSMENT: 68 y.o. 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, grade 2, estrogen and progesterone receptor positive, HER-2 not amplified, with an MIB-1 of 5%  (b) in the lower outer quadrant a  clinical T1c N0, stage IA-B invasive ductal carcinoma, grade 2, estrogen receptor only moderately positive at 10%, progesterone receptor negative, with an MIB-1 of 40%, and HER-2 not amplified  (1) neoadjuvant chemotherapy will consist of doxorubicin and cyclophosphamide in dose dense fashion x4 starting 03/16/2018, followed by paclitaxel and carboplatin weekly x12  (a)echo on 03/11/2018 shows well preserved EF of 60-65%  (2) definitive surgery to follow  (3) adjuvant radiation as appropriate  (4) antiestrogens to follow at the completion of local treatment (for upper outer quadrant tumor)  (5) genetics testing on 03/24/2018 showed no pathogenic mutations.  Genes tested include:  APC, ATM, AXIN2, BARD1, BMPR1A, BRCA1, BRCA2, BRIP1, CDH1, CDKN2A (p14ARF), CDKN2A (p16INK4a), CKD4, CHEK2, CTNNA1, DICER1, EPCAM (Deletion/duplication testing only), GREM1 (promoter region deletion/duplication testing only), KIT, MEN1, MLH1, MSH2, MSH3, MSH6, MUTYH, NBN, NF1, NHTL1, PALB2, PDGFRA, PMS2, POLD1, POLE, PTEN, RAD50, RAD51C, RAD51D, SDHB, SDHC, SDHD, SMAD4, SMARCA4. STK11, TP53, TSC1, TSC2, and VHL.  The following genes were evaluated for sequence changes only: SDHA and HOXB13 c.251G>A variant only.  PLAN:  Emily Chambers is doing moderately well today.  Her labs are stable and she isn't neutropenic, however she is slightly dehydrated today, and we will give her some IV fluids.  She is no longer nauseated, but doesn't like the taste of water.  I gave her suggestions to flavor her water to help it become more palatable.    I went ahead and wrote for Emily Chambers to receive IV fluids with her next day 8 visit after her final Doxorubicin and Cyclophosphamide.    Emily Chambers will return in 1 week for labs and f/u for her final chemotherapy treatment.  She knows to call for any questions or concerns prior to her next appointment with [place].    A total of (20) minutes of face-to-face time was spent with this patient with greater than 50%  of that time in counseling and care-coordination.   Scot Dock, NP   04/20/2018 4:30 PM Medical Oncology and Hematology [facility] [address] [place], [place] [address] Tel. [phone]    Fax. [phone]

## 2018-04-20 NOTE — Progress Notes (Signed)
Received call from Emily Bihari NP stating Pt. Needed fluids today. Received Pt. From [facility] fluids given order was for 500 ml/hr Per providerMendel Ryder NP ) OK to change rate on pump to 999. Pt. Will receive fluids over an hour.

## 2018-04-20 NOTE — Progress Notes (Signed)
Pt. Tolerated infusion well no further problems or concerns noted.

## 2018-04-26 ENCOUNTER — Other Ambulatory Visit: Payer: Self-pay | Admitting: *Deleted

## 2018-04-26 DIAGNOSIS — C50512 Malignant neoplasm of lower-outer quadrant of left female breast: Secondary | ICD-10-CM

## 2018-04-26 DIAGNOSIS — Z171 Estrogen receptor negative status [ER-]: Principal | ICD-10-CM

## 2018-04-26 NOTE — Progress Notes (Signed)
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 She underwent bilateral diagnostic mammography with tomography and left breast ultrasonography at [facility] on 02/16/2018 showing: breast density category C; two left breast masses, one at 2 o'clock and the other at 3:30 o'clock. The 2 o'clock mass (1.3 x 1 x 1 cm) corresponds to the mammographic abnormality and is highly suspicious for breast carcinoma. The other mass (1.2 x 0.7 x 1.2 cm)  is suspicious for breast carcinoma. No left axillary adenopathy.   Accordingly on 02/19/2018 she proceeded to biopsy of the left breast area in question. 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 Prognostic indicators significant for: estrogen receptor, 90% positive and progesterone receptor, 95% positive, both with strong staining intensity. Proliferation marker Ki67 at 5%. HER2 negative (1+).   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 Prognostic indicators significant for: estrogen receptor, 10% positive with moderate staining intensity and progesterone receptor, 0%  negative. Proliferation marker Ki67 at 40%. HER2 negative (1+).  The patient's subsequent history is as detailed below.   INTERVAL HISTORY: Emily Chambers returns today for follow-up and treatment of her estrogen positive and estrogen negative breast cancer. She is accompanied by a friend or family member.  She continues on neoadjuvant chemotherapy consistingof doxorubicin and cyclophosphamide in dose dense fashion x4 to be followed by weekly paclitaxel/carboplatin x12. Today is day 1 cycle 4.   Since her last visit here, she has not undergone any additional studies.     REVIEW OF SYSTEMS: Emily Chambers says she is having a little bit of nausea today, without vomiting. Her sense of taste is "terrible;" she stays away from using metal pans, which change the flavor of her food. Emily Chambers wondered when she would be able to return to work. The patient denies unusual headaches, visual changes, nausea, vomiting, or dizziness. There has been no unusual cough, phlegm production, or pleurisy. This been no change in bowel or bladder habits. The patient denies unexplained fatigue or unexplained weight loss, bleeding, rash, or fever. A detailed review of systems was otherwise noncontributory.    PAST MEDICAL HISTORY: Past Medical History:  Diagnosis Date  . Family history of breast cancer   . Family history of lung cancer   . Family history of throat cancer   . High cholesterol   . Hypertension   . Kidney stone   . Kidney stones   . UTI (lower urinary tract infection)     PAST SURGICAL HISTORY: Past Surgical History:  Procedure Laterality Date  . BREAST EXCISIONAL BIOPSY Left 1999,2005   cysts removed  . BREAST SURGERY Left    cyst and biopsy  . PORTACATH PLACEMENT Left 03/10/2018   Procedure: INSERTION PORT-A-CATH;  Surgeon: Stark Klein, MD;  Location: [facility];  Service: General;  Laterality: Left;    FAMILY HISTORY: Family History  Problem Relation Age of Onset  . Hypertension Mother    . Heart disease Mother   . Dementia Mother   . Healthy Father   . Colon cancer Maternal Grandmother   . Cancer Paternal Grandmother        unk type  . Lung cancer Cousin        36s  . Breast cancer Cousin        78s  . Breast cancer Cousin        51s  . Breast cancer Cousin   . Cancer Paternal Aunt        unk type d. 40, possibly pancreatic  . Cancer Paternal Aunt        unk type d. 12s  . Cancer Paternal Uncle        unk type   . Cancer Paternal Uncle        unk type d. 3s  . Cancer Maternal Aunt        unk type, d. 32s  . Throat cancer Maternal Uncle        d. 32s  . Ovarian cancer Neg Hx    Patient father is alive at 24 years old. Patient mother died from heart disease and dementia at age 35.  The patient denies a family hx of ovarian cancer. She has 3 siblings, 1 brother and 2 sisters. She has a maternal cousin diagnosed with lung cancer in her 59s. She has 3 paternal cousins with breast cancer, one was diagnosed in her 54s and has passed away.   GYNECOLOGIC HISTORY:  No LMP recorded. Patient is postmenopausal. Menarche: 68 years old Age at first live birth: n/a GX P 0 LMP 2010 Contraceptive n/a HRT no  Hysterectomy? no BSo? no   SOCIAL HISTORY: She is single and works as a Scientist, water quality at [employer]). She lives alone, with no pets.    ADVANCED DIRECTIVES: Not in place.  At the 03/03/2018 visit she was given the appropriate documents to complete and notarize at her discretion.  She is planning to name her niece, Emily Chambers, as her HCPOA.   HEALTH MAINTENANCE: Social History   Tobacco Use  . Smoking status: Never Smoker  . Smokeless tobacco: Never Used  Substance Use Topics  . Alcohol use: Yes    Comment: seldom  . Drug use: No     Colonoscopy: 2014? [facility]  PAP: 02/02/2018  Bone density: 03/15/2016, T-score 0.4, Dr. Alden Hipp   No Known Allergies  Current Outpatient Medications  Medication Sig Dispense  Refill  . amLODipine (NORVASC) 10 MG tablet Take 10 mg by mouth every morning.     Marland Kitchen aspirin 81 MG chewable tablet Chew 81 mg by mouth daily.    . ciprofloxacin (CIPRO) 500 MG tablet Take 1 tablet (500 mg total) by mouth 2 (two) times daily. 10 tablet 0  . dexamethasone (DECADRON) 4 MG tablet Take 2 tablets by mouth twice a day for three days starting the day after chemotherapy. Take with food. 30 tablet 1  . lidocaine-prilocaine (EMLA) cream Apply 1 application topically as needed. 30 g 0  . LORazepam (ATIVAN) 0.5 MG tablet Take 1 tablet (0.5 mg total) by mouth every 6 (six) hours as needed (Nausea or vomiting). 30 tablet 0  . losartan (COZAAR) 100 MG tablet Take 100 mg by mouth every morning.     Marland Kitchen omeprazole (PRILOSEC) 40 MG  capsule Take 1 capsule (40 mg total) by mouth daily. 30 capsule 5  . oxyCODONE (OXY IR/ROXICODONE) 5 MG immediate release tablet Take 1 tablet (5 mg total) by mouth every 6 (six) hours as needed for severe pain. 10 tablet 0  . potassium chloride SA (K-DUR,KLOR-CON) 20 MEQ tablet Take 1 tablet (20 mEq total) by mouth daily. 5 tablet 0  . pravastatin (PRAVACHOL) 20 MG tablet Take 20 mg by mouth every morning.     . prochlorperazine (COMPAZINE) 10 MG tablet Take 1 tablet (10 mg total) by mouth every 6 (six) hours as needed (Nausea or vomiting). 30 tablet 1   No current facility-administered medications for this visit.     OBJECTIVE: Middle-aged African-American woman in no acute distress Vitals:   04/27/18 1110  BP: 135/81  Pulse: 89  Resp: 18  Temp: 98.3 F (36.8 C)  SpO2: 100%     Body mass index is 32.56 kg/m.   Wt Readings from Last 3 Encounters:  04/27/18 178 lb (80.7 kg)  04/20/18 176 lb 11.2 oz (80.2 kg)  04/20/18 175 lb 3.2 oz (79.5 kg)  ECOG FS:1  Sclerae unicteric, EOMs intact No cervical or supraclavicular adenopathy Lungs no rales or rhonchi Heart regular rate and rhythm Abd soft, nontender, positive bowel sounds MSK no focal spinal tenderness,  no upper extremity lymphedema Neuro: nonfocal, well oriented, appropriate affect Breasts: Right breast is unremarkable.  I do not palpate any masses in the left breast.  Both axillae are benign.   LAB RESULTS:  CMP     Component Value Date/Time   NA 143 04/20/2018 1116   K 3.8 04/20/2018 1116   CL 106 04/20/2018 1116   CO2 28 04/20/2018 1116   GLUCOSE 112 (H) 04/20/2018 1116   BUN 15 04/20/2018 1116   CREATININE 0.72 04/20/2018 1116   CALCIUM 9.3 04/20/2018 1116   PROT 6.1 (L) 04/20/2018 1116   ALBUMIN 3.5 04/20/2018 1116   AST 8 (L) 04/20/2018 1116   ALT 17 04/20/2018 1116   ALKPHOS 133 (H) 04/20/2018 1116   BILITOT 0.4 04/20/2018 1116   GFRNONAA >60 04/20/2018 1116   GFRAA >60 04/20/2018 1116    No results found for: TOTALPROTELP, ALBUMINELP, A1GS, A2GS, BETS, BETA2SER, GAMS, MSPIKE, SPEI  No results found for: KPAFRELGTCHN, LAMBDASER, KAPLAMBRATIO  Lab Results  Component Value Date   WBC 11.1 (H) 04/27/2018   NEUTROABS PENDING 04/27/2018   HGB 9.6 (L) 04/27/2018   HCT 31.5 (L) 04/27/2018   MCV 91.6 04/27/2018   PLT 135 (L) 04/27/2018    _0 @  No results found for: LABCA2  No components found for: RJJOAC166  No results for input(s): INR in the last 168 hours.  No results found for: LABCA2  No results found for: AYT016  No results found for: WFU932  No results found for: TFT732  No results found for: CA2729  No components found for: HGQUANT  No results found for: CEA1 / No results found for: CEA1   No results found for: AFPTUMOR  No results found for: CHROMOGRNA  No results found for: PSA1  Appointment on 04/27/2018  Component Date Value Ref Range Status  . WBC Count 04/27/2018 11.1* 4.0 - 10.5 K/uL Final  . RBC 04/27/2018 3.44* 3.87 - 5.11 MIL/uL Final  . Hemoglobin 04/27/2018 9.6* 12.0 - 15.0 g/dL Final  . HCT 04/27/2018 31.5* 36.0 - 46.0 % Final  . MCV 04/27/2018 91.6  80.0 - 100.0 fL Final  . MCH 04/27/2018 27.9  26.0 -  34.0  pg Final  . MCHC 04/27/2018 30.5  30.0 - 36.0 g/dL Final  . RDW 04/27/2018 15.9* 11.5 - 15.5 % Final  . Platelet Count 04/27/2018 135* 150 - 400 K/uL Final  . nRBC 04/27/2018 0.6* 0.0 - 0.2 % Final   Performed at [facility], [place] [address] [place], [place] [address]  . Neutrophils Relative % 04/27/2018 PENDING  % Incomplete  . Neutro Abs 04/27/2018 PENDING  1.7 - 7.7 K/uL Incomplete  . Band Neutrophils 04/27/2018 PENDING  % Incomplete  . Lymphocytes Relative 04/27/2018 PENDING  % Incomplete  . Lymphs Abs 04/27/2018 PENDING  0.7 - 4.0 K/uL Incomplete  . Monocytes Relative 04/27/2018 PENDING  % Incomplete  . Monocytes Absolute 04/27/2018 PENDING  0.1 - 1.0 K/uL Incomplete  . Eosinophils Relative 04/27/2018 PENDING  % Incomplete  . Eosinophils Absolute 04/27/2018 PENDING  0.0 - 0.5 K/uL Incomplete  . Basophils Relative 04/27/2018 PENDING  % Incomplete  . Basophils Absolute 04/27/2018 PENDING  0.0 - 0.1 K/uL Incomplete  . WBC Morphology 04/27/2018 PENDING   Incomplete  . RBC Morphology 04/27/2018 PENDING   Incomplete  . Smear Review 04/27/2018 PENDING   Incomplete  . Other 04/27/2018 PENDING  % Incomplete  . nRBC 04/27/2018 PENDING  0 /100 WBC Incomplete  . Metamyelocytes Relative 04/27/2018 PENDING  % Incomplete  . Myelocytes 04/27/2018 PENDING  % Incomplete  . Promyelocytes Relative 04/27/2018 PENDING  % Incomplete  . Blasts 04/27/2018 PENDING  % Incomplete    (this displays the last labs from the last 3 days)  No results found for: TOTALPROTELP, ALBUMINELP, A1GS, A2GS, BETS, BETA2SER, GAMS, MSPIKE, SPEI (this displays SPEP labs)  No results found for: KPAFRELGTCHN, LAMBDASER, KAPLAMBRATIO (kappa/lambda light chains)  No results found for: HGBA, HGBA2QUANT, HGBFQUANT, HGBSQUAN (Hemoglobinopathy evaluation)   No results found for: LDH  No results found for: IRON, TIBC, IRONPCTSAT (Iron and TIBC)  No results found for: FERRITIN  Urinalysis      Component Value Date/Time   COLORURINE YELLOW 04/12/2018 1504   APPEARANCEUR CLEAR 04/12/2018 1504   LABSPEC 1.020 04/12/2018 1504   PHURINE 5.0 04/12/2018 1504   GLUCOSEU NEGATIVE 04/12/2018 1504   HGBUR NEGATIVE 04/12/2018 1504   BILIRUBINUR NEGATIVE 04/12/2018 1504   KETONESUR NEGATIVE 04/12/2018 1504   PROTEINUR NEGATIVE 04/12/2018 1504   UROBILINOGEN 0.2 07/07/2012 1159   NITRITE NEGATIVE 04/12/2018 1504   LEUKOCYTESUR TRACE (A) 04/12/2018 1504     STUDIES: No results found.   ELIGIBLE FOR AVAILABLE RESEARCH PROTOCOL: Upbeat   ASSESSMENT: 68 y.o. 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, grade 2, estrogen and progesterone receptor positive, HER-2 not amplified, with an MIB-1 of 5%  (b) in the lower outer quadrant a clinical T1c N0, stage IA-B invasive ductal carcinoma, grade 2, estrogen receptor only moderately positive at 10%, progesterone receptor negative, with an MIB-1 of 40%, and HER-2 not amplified  (1) neoadjuvant chemotherapy will consist of doxorubicin and cyclophosphamide in dose dense fashion x4 starting 03/16/2018, followed by paclitaxel and carboplatin weekly x12  (a) echo on 03/11/2018 shows well preserved EF of 60-65%  (2) definitive surgery to follow  (3) adjuvant radiation as appropriate  (4) antiestrogens to follow at the completion of local treatment (for upper outer quadrant tumor)  (5) genetics testing on 03/24/2018 showed no pathogenic mutations.  Genes tested include:  APC, ATM, AXIN2, BARD1, BMPR1A,  BRCA1, BRCA2, BRIP1, CDH1, CDKN2A (p14ARF), CDKN2A (p16INK4a), CKD4, CHEK2, CTNNA1, DICER1, EPCAM (Deletion/duplication testing only), GREM1 (promoter region deletion/duplication testing only), KIT, MEN1, MLH1, MSH2, MSH3, MSH6, MUTYH, NBN, NF1, NHTL1, PALB2, PDGFRA, PMS2, POLD1, POLE, PTEN, RAD50, RAD51C, RAD51D, SDHB, SDHC,  SDHD, SMAD4, SMARCA4. STK11, TP53, TSC1, TSC2, and VHL.  The following genes were evaluated for sequence changes only: SDHA and HOXB13 c.251G>A variant only.   PLAN: Amarie will complete her doxorubicin and cyclophosphamide treatments today.  He has tolerated them remarkably well.  Today we discussed the carboplatin and paclitaxel that she will start on 05/11/2018.  I have entered the orders and changed the premeds appropriately.  She understands particularly the first cycle may cause some reactions but usually these did not occur with subsequent treatments.  She is very interested in getting back to work.  I think possibly she could get back to work as of March 27.  I wrote her a note explaining that and explaining that she will need to be off every Tuesday through the first week in June for her chemo treatment  As far as her taste perversion is concerned I have suggested she had some ascitic foods like tomatoes or simply a few drops of lemon juice to her food and that might be helpful.  She knows to call for any other issues that may develop before the next visit.  , Virgie Dad, MD  04/27/18 11:29 AM Medical Oncology and Hematology [facility] [address] [place], [place] [address] Tel. [phone]    Fax. [phone]  I, Jacqualyn Posey am acting as a Education administrator for Chauncey Cruel, MD.   I, Lurline Del MD, have reviewed the above documentation for accuracy and completeness, and I agree with the above.

## 2018-04-27 ENCOUNTER — Encounter: Payer: Self-pay | Admitting: Oncology

## 2018-04-27 ENCOUNTER — Inpatient Hospital Stay: Payer: Medicare Other | Attending: Oncology

## 2018-04-27 ENCOUNTER — Inpatient Hospital Stay: Payer: Medicare Other

## 2018-04-27 ENCOUNTER — Inpatient Hospital Stay: Payer: Medicare Other | Admitting: Oncology

## 2018-04-27 VITALS — BP 135/81 | HR 89 | Temp 98.3°F | Resp 18 | Ht 62.0 in | Wt 178.0 lb

## 2018-04-27 DIAGNOSIS — Z5111 Encounter for antineoplastic chemotherapy: Secondary | ICD-10-CM | POA: Diagnosis present

## 2018-04-27 DIAGNOSIS — C50512 Malignant neoplasm of lower-outer quadrant of left female breast: Secondary | ICD-10-CM

## 2018-04-27 DIAGNOSIS — Z5189 Encounter for other specified aftercare: Secondary | ICD-10-CM | POA: Diagnosis not present

## 2018-04-27 DIAGNOSIS — Z79899 Other long term (current) drug therapy: Secondary | ICD-10-CM

## 2018-04-27 DIAGNOSIS — Z170 Estrogen receptor positive status [ER+]: Secondary | ICD-10-CM | POA: Insufficient documentation

## 2018-04-27 DIAGNOSIS — C50412 Malignant neoplasm of upper-outer quadrant of left female breast: Secondary | ICD-10-CM | POA: Insufficient documentation

## 2018-04-27 DIAGNOSIS — Z7982 Long term (current) use of aspirin: Secondary | ICD-10-CM

## 2018-04-27 DIAGNOSIS — N184 Chronic kidney disease, stage 4 (severe): Secondary | ICD-10-CM | POA: Insufficient documentation

## 2018-04-27 DIAGNOSIS — Z171 Estrogen receptor negative status [ER-]: Principal | ICD-10-CM

## 2018-04-27 LAB — CMP (CANCER CENTER ONLY)
ALK PHOS: 115 U/L (ref 38–126)
ALT: 22 U/L (ref 0–44)
AST: 13 U/L — ABNORMAL LOW (ref 15–41)
Albumin: 3.7 g/dL (ref 3.5–5.0)
Anion gap: 9 (ref 5–15)
BILIRUBIN TOTAL: 0.3 mg/dL (ref 0.3–1.2)
BUN: 12 mg/dL (ref 8–23)
CO2: 26 mmol/L (ref 22–32)
Calcium: 8.9 mg/dL (ref 8.9–10.3)
Chloride: 108 mmol/L (ref 98–111)
Creatinine: 0.72 mg/dL (ref 0.44–1.00)
GFR, Est AFR Am: >60 mL/min (ref >60)
GFR, Estimated: >60 mL/min (ref >60)
Glucose, Bld: 172 mg/dL — ABNORMAL HIGH (ref 70–99)
Potassium: 3.2 mmol/L — ABNORMAL LOW (ref 3.5–5.1)
Sodium: 143 mmol/L (ref 135–145)
TOTAL PROTEIN: 6.7 g/dL (ref 6.5–8.1)

## 2018-04-27 LAB — CBC WITH DIFFERENTIAL (CANCER CENTER ONLY)
Abs Immature Granulocytes: 1.03 10*3/uL — ABNORMAL HIGH (ref 0.00–0.07)
Basophils Absolute: 0.1 10*3/uL (ref 0.0–0.1)
Basophils Relative: 1 %
Eosinophils Absolute: 0.1 10*3/uL (ref 0.0–0.5)
Eosinophils Relative: 0 %
HCT: 31.5 % — ABNORMAL LOW (ref 36.0–46.0)
HEMOGLOBIN: 9.6 g/dL — AB (ref 12.0–15.0)
Immature Granulocytes: 9 %
Lymphocytes Relative: 12 %
Lymphs Abs: 1.3 10*3/uL (ref 0.7–4.0)
MCH: 27.9 pg (ref 26.0–34.0)
MCHC: 30.5 g/dL (ref 30.0–36.0)
MCV: 91.6 fL (ref 80.0–100.0)
Monocytes Absolute: 0.6 10*3/uL (ref 0.1–1.0)
Monocytes Relative: 5 %
Neutro Abs: 8.1 10*3/uL — ABNORMAL HIGH (ref 1.7–7.7)
Neutrophils Relative %: 73 %
Platelet Count: 135 10*3/uL — ABNORMAL LOW (ref 150–400)
RBC: 3.44 MIL/uL — ABNORMAL LOW (ref 3.87–5.11)
RDW: 15.9 % — ABNORMAL HIGH (ref 11.5–15.5)
WBC Count: 11.1 10*3/uL — ABNORMAL HIGH (ref 4.0–10.5)
nRBC: 0.6 % — ABNORMAL HIGH (ref 0.0–0.2)

## 2018-04-27 MED ORDER — SODIUM CHLORIDE 0.9 % IV SOLN
Freq: Once | INTRAVENOUS | Status: AC
  Administered 2018-04-27: 12:00:00 via INTRAVENOUS
  Filled 2018-04-27: qty 250

## 2018-04-27 MED ORDER — PALONOSETRON HCL INJECTION 0.25 MG/5ML
0.2500 mg | Freq: Once | INTRAVENOUS | Status: AC
  Administered 2018-04-27: 0.25 mg via INTRAVENOUS

## 2018-04-27 MED ORDER — HEPARIN SOD (PORK) LOCK FLUSH 100 UNIT/ML IV SOLN
500.0000 [IU] | Freq: Once | INTRAVENOUS | Status: AC | PRN
  Administered 2018-04-27: 500 [IU]
  Filled 2018-04-27: qty 5

## 2018-04-27 MED ORDER — PALONOSETRON HCL INJECTION 0.25 MG/5ML
INTRAVENOUS | Status: AC
  Filled 2018-04-27: qty 5

## 2018-04-27 MED ORDER — SODIUM CHLORIDE 0.9% FLUSH
10.0000 mL | INTRAVENOUS | Status: DC | PRN
  Administered 2018-04-27: 10 mL
  Filled 2018-04-27: qty 10

## 2018-04-27 MED ORDER — DOXORUBICIN HCL CHEMO IV INJECTION 2 MG/ML
60.0000 mg/m2 | Freq: Once | INTRAVENOUS | Status: AC
  Administered 2018-04-27: 112 mg via INTRAVENOUS
  Filled 2018-04-27: qty 56

## 2018-04-27 MED ORDER — SODIUM CHLORIDE 0.9 % IV SOLN
Freq: Once | INTRAVENOUS | Status: AC
  Administered 2018-04-27: 12:00:00 via INTRAVENOUS
  Filled 2018-04-27: qty 5

## 2018-04-27 MED ORDER — SODIUM CHLORIDE 0.9 % IV SOLN
600.0000 mg/m2 | Freq: Once | INTRAVENOUS | Status: AC
  Administered 2018-04-27: 1120 mg via INTRAVENOUS
  Filled 2018-04-27: qty 56

## 2018-04-27 NOTE — Patient Instructions (Signed)
[facility] Discharge Instructions for Patients Receiving Chemotherapy  Today you received the following chemotherapy agents Adriamycin, Cytoxan.  To help prevent nausea and vomiting after your treatment, we encourage you to take your nausea medication as prescribed.   If you develop nausea and vomiting that is not controlled by your nausea medication, call the clinic.   BELOW ARE SYMPTOMS THAT SHOULD BE REPORTED IMMEDIATELY:  *FEVER GREATER THAN 100.5 F  *CHILLS WITH OR WITHOUT FEVER  NAUSEA AND VOMITING THAT IS NOT CONTROLLED WITH YOUR NAUSEA MEDICATION  *UNUSUAL SHORTNESS OF BREATH  *UNUSUAL BRUISING OR BLEEDING  TENDERNESS IN MOUTH AND THROAT WITH OR WITHOUT PRESENCE OF ULCERS  *URINARY PROBLEMS  *BOWEL PROBLEMS  UNUSUAL RASH Items with * indicate a potential emergency and should be followed up as soon as possible.  Feel free to call the clinic should you have any questions or concerns. The clinic phone number is [phone].  Please show the CHEMO ALERT CARD at check-in to the Emergency Department and triage nurse.   

## 2018-04-27 NOTE — Progress Notes (Signed)
Patient came and brought proof of income for grants.  Patient approved for one-time $1000 [employer]. She has a copy of the expense sheet as well as the expenses it covers along with the Outpatient pharmacy information. Went over in detail expenses and how they are covered and advised grant is good as long as she is in treatment or 18 months whichever comes first. She verbalized understanding. She received a gas card today from her grant.  Checked for available copay assistance for her diagnosis. The funds are currently exhausted. Advised patient if funds become available for PAF particularly with her insurance plan, I will apply on her behalf online. She verbalized understanding.  She has my card for any additional financial questions or concerns.

## 2018-04-29 ENCOUNTER — Inpatient Hospital Stay: Payer: Medicare Other

## 2018-04-29 VITALS — BP 125/77 | HR 74 | Temp 98.3°F | Resp 18

## 2018-04-29 DIAGNOSIS — Z171 Estrogen receptor negative status [ER-]: Principal | ICD-10-CM

## 2018-04-29 DIAGNOSIS — C50512 Malignant neoplasm of lower-outer quadrant of left female breast: Secondary | ICD-10-CM

## 2018-04-29 DIAGNOSIS — Z170 Estrogen receptor positive status [ER+]: Secondary | ICD-10-CM

## 2018-04-29 DIAGNOSIS — Z5111 Encounter for antineoplastic chemotherapy: Secondary | ICD-10-CM | POA: Diagnosis not present

## 2018-04-29 DIAGNOSIS — C50412 Malignant neoplasm of upper-outer quadrant of left female breast: Secondary | ICD-10-CM

## 2018-04-29 MED ORDER — PEGFILGRASTIM-CBQV 6 MG/0.6ML ~~LOC~~ SOSY
PREFILLED_SYRINGE | SUBCUTANEOUS | Status: AC
  Filled 2018-04-29: qty 0.6

## 2018-04-29 MED ORDER — PEGFILGRASTIM-CBQV 6 MG/0.6ML ~~LOC~~ SOSY
6.0000 mg | PREFILLED_SYRINGE | Freq: Once | SUBCUTANEOUS | Status: AC
  Administered 2018-04-29: 6 mg via SUBCUTANEOUS

## 2018-04-29 NOTE — Patient Instructions (Signed)
Pegfilgrastim injection  What is this medicine?  PEGFILGRASTIM (PEG fil gra stim) is a long-acting granulocyte colony-stimulating factor that stimulates the growth of neutrophils, a type of white blood cell important in the body's fight against infection. It is used to reduce the incidence of fever and infection in patients with certain types of cancer who are receiving chemotherapy that affects the bone marrow, and to increase survival after being exposed to high doses of radiation.  This medicine may be used for other purposes; ask your health care provider or pharmacist if you have questions.  COMMON BRAND NAME(S): Fulphila, Neulasta, UDENYCA  What should I tell my health care provider before I take this medicine?  They need to know if you have any of these conditions:  -kidney disease  -latex allergy  -ongoing radiation therapy  -sickle cell disease  -skin reactions to acrylic adhesives (On-Body Injector only)  -an unusual or allergic reaction to pegfilgrastim, filgrastim, other medicines, foods, dyes, or preservatives  -pregnant or trying to get pregnant  -breast-feeding  How should I use this medicine?  This medicine is for injection under the skin. If you get this medicine at home, you will be taught how to prepare and give the pre-filled syringe or how to use the On-body Injector. Refer to the patient Instructions for Use for detailed instructions. Use exactly as directed. Tell your healthcare provider immediately if you suspect that the On-body Injector may not have performed as intended or if you suspect the use of the On-body Injector resulted in a missed or partial dose.  It is important that you put your used needles and syringes in a special sharps container. Do not put them in a trash can. If you do not have a sharps container, call your pharmacist or healthcare provider to get one.  Talk to your pediatrician regarding the use of this medicine in children. While this drug may be prescribed for  selected conditions, precautions do apply.  Overdosage: If you think you have taken too much of this medicine contact a poison control center or emergency room at once.  NOTE: This medicine is only for you. Do not share this medicine with others.  What if I miss a dose?  It is important not to miss your dose. Call your doctor or health care professional if you miss your dose. If you miss a dose due to an On-body Injector failure or leakage, a new dose should be administered as soon as possible using a single prefilled syringe for manual use.  What may interact with this medicine?  Interactions have not been studied.  Give your health care provider a list of all the medicines, herbs, non-prescription drugs, or dietary supplements you use. Also tell them if you smoke, drink alcohol, or use illegal drugs. Some items may interact with your medicine.  This list may not describe all possible interactions. Give your health care provider a list of all the medicines, herbs, non-prescription drugs, or dietary supplements you use. Also tell them if you smoke, drink alcohol, or use illegal drugs. Some items may interact with your medicine.  What should I watch for while using this medicine?  You may need blood work done while you are taking this medicine.  If you are going to need a MRI, CT scan, or other procedure, tell your doctor that you are using this medicine (On-Body Injector only).  What side effects may I notice from receiving this medicine?  Side effects that you should report to   your doctor or health care professional as soon as possible:  -allergic reactions like skin rash, itching or hives, swelling of the face, lips, or tongue  -back pain  -dizziness  -fever  -pain, redness, or irritation at site where injected  -pinpoint red spots on the skin  -red or dark-brown urine  -shortness of breath or breathing problems  -stomach or side pain, or pain at the shoulder  -swelling  -tiredness  -trouble passing urine or  change in the amount of urine  Side effects that usually do not require medical attention (report to your doctor or health care professional if they continue or are bothersome):  -bone pain  -muscle pain  This list may not describe all possible side effects. Call your doctor for medical advice about side effects. You may report side effects to FDA at 1-800-FDA-1088.  Where should I keep my medicine?  Keep out of the reach of children.  If you are using this medicine at home, you will be instructed on how to store it. Throw away any unused medicine after the expiration date on the label.  NOTE: This sheet is a summary. It may not cover all possible information. If you have questions about this medicine, talk to your doctor, pharmacist, or health care provider.   2019 [employer] (2017-05-18 16:57:08)

## 2018-05-03 ENCOUNTER — Other Ambulatory Visit: Payer: Self-pay | Admitting: *Deleted

## 2018-05-03 DIAGNOSIS — Z171 Estrogen receptor negative status [ER-]: Principal | ICD-10-CM

## 2018-05-03 DIAGNOSIS — C50512 Malignant neoplasm of lower-outer quadrant of left female breast: Secondary | ICD-10-CM

## 2018-05-04 ENCOUNTER — Encounter: Payer: Self-pay | Admitting: Adult Health

## 2018-05-04 ENCOUNTER — Inpatient Hospital Stay: Payer: Medicare Other | Admitting: Adult Health

## 2018-05-04 ENCOUNTER — Other Ambulatory Visit: Payer: Self-pay | Admitting: Adult Health

## 2018-05-04 ENCOUNTER — Telehealth: Payer: Self-pay | Admitting: Adult Health

## 2018-05-04 ENCOUNTER — Inpatient Hospital Stay: Payer: Medicare Other

## 2018-05-04 VITALS — BP 135/76 | HR 91 | Temp 98.5°F | Resp 19 | Ht 62.0 in | Wt 179.3 lb

## 2018-05-04 DIAGNOSIS — Z171 Estrogen receptor negative status [ER-]: Secondary | ICD-10-CM

## 2018-05-04 DIAGNOSIS — Z170 Estrogen receptor positive status [ER+]: Secondary | ICD-10-CM | POA: Diagnosis not present

## 2018-05-04 DIAGNOSIS — Z7982 Long term (current) use of aspirin: Secondary | ICD-10-CM

## 2018-05-04 DIAGNOSIS — C50512 Malignant neoplasm of lower-outer quadrant of left female breast: Secondary | ICD-10-CM

## 2018-05-04 DIAGNOSIS — Z79899 Other long term (current) drug therapy: Secondary | ICD-10-CM

## 2018-05-04 DIAGNOSIS — Z5111 Encounter for antineoplastic chemotherapy: Secondary | ICD-10-CM | POA: Diagnosis not present

## 2018-05-04 DIAGNOSIS — E876 Hypokalemia: Secondary | ICD-10-CM

## 2018-05-04 DIAGNOSIS — C50412 Malignant neoplasm of upper-outer quadrant of left female breast: Secondary | ICD-10-CM

## 2018-05-04 DIAGNOSIS — N184 Chronic kidney disease, stage 4 (severe): Secondary | ICD-10-CM

## 2018-05-04 LAB — CBC WITH DIFFERENTIAL (CANCER CENTER ONLY)
Abs Immature Granulocytes: 0.33 10*3/uL — ABNORMAL HIGH (ref 0.00–0.07)
Basophils Absolute: 0.1 10*3/uL (ref 0.0–0.1)
Basophils Relative: 1 %
Eosinophils Absolute: 0.1 10*3/uL (ref 0.0–0.5)
Eosinophils Relative: 1 %
HCT: 27.8 % — ABNORMAL LOW (ref 36.0–46.0)
Hemoglobin: 8.5 g/dL — ABNORMAL LOW (ref 12.0–15.0)
Immature Granulocytes: 3 %
LYMPHS PCT: 11 %
Lymphs Abs: 1.3 10*3/uL (ref 0.7–4.0)
MCH: 27.4 pg (ref 26.0–34.0)
MCHC: 30.6 g/dL (ref 30.0–36.0)
MCV: 89.7 fL (ref 80.0–100.0)
Monocytes Absolute: 0.6 10*3/uL (ref 0.1–1.0)
Monocytes Relative: 5 %
Neutro Abs: 9.2 10*3/uL — ABNORMAL HIGH (ref 1.7–7.7)
Neutrophils Relative %: 79 %
PLATELETS: 171 10*3/uL (ref 150–400)
RBC: 3.1 MIL/uL — ABNORMAL LOW (ref 3.87–5.11)
RDW: 15.3 % (ref 11.5–15.5)
WBC Count: 11.9 10*3/uL — ABNORMAL HIGH (ref 4.0–10.5)
nRBC: 0 % (ref 0.0–0.2)

## 2018-05-04 LAB — CMP (CANCER CENTER ONLY)
ALT: 19 U/L (ref 0–44)
AST: 11 U/L — ABNORMAL LOW (ref 15–41)
Albumin: 3.5 g/dL (ref 3.5–5.0)
Alkaline Phosphatase: 129 U/L — ABNORMAL HIGH (ref 38–126)
Anion gap: 11 (ref 5–15)
BUN: 13 mg/dL (ref 8–23)
CHLORIDE: 108 mmol/L (ref 98–111)
CO2: 25 mmol/L (ref 22–32)
Calcium: 9.3 mg/dL (ref 8.9–10.3)
Creatinine: 0.75 mg/dL (ref 0.44–1.00)
GFR, Est AFR Am: >60 mL/min (ref >60)
GFR, Estimated: >60 mL/min (ref >60)
Glucose, Bld: 155 mg/dL — ABNORMAL HIGH (ref 70–99)
Potassium: 3 mmol/L — CL (ref 3.5–5.1)
Sodium: 144 mmol/L (ref 135–145)
Total Bilirubin: 0.2 mg/dL — ABNORMAL LOW (ref 0.3–1.2)
Total Protein: 6.5 g/dL (ref 6.5–8.1)

## 2018-05-04 MED ORDER — POTASSIUM CHLORIDE CRYS ER 20 MEQ PO TBCR: 20.0000 meq | EXTENDED_RELEASE_TABLET | Freq: Every day | ORAL | 0 refills | Status: DC

## 2018-05-04 NOTE — Telephone Encounter (Signed)
Gave avs and calendar ° °

## 2018-05-04 NOTE — Progress Notes (Signed)
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 She underwent bilateral diagnostic mammography with tomography and left breast ultrasonography at [facility] on 02/16/2018 showing: breast density category C; two left breast masses, one at 2 o'clock and the other at 3:30 o'clock. The 2 o'clock mass (1.3 x 1 x 1 cm) corresponds to the mammographic abnormality and is highly suspicious for breast carcinoma. The other mass (1.2 x 0.7 x 1.2 cm)  is suspicious for breast carcinoma. No left axillary adenopathy.   Accordingly on 02/19/2018 she proceeded to biopsy of the left breast area in question. 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 Prognostic indicators significant for: estrogen receptor, 90% positive and progesterone receptor, 95% positive, both with strong staining intensity. Proliferation marker Ki67 at 5%. HER2 negative (1+).   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 Prognostic indicators significant for: estrogen receptor, 10% positive with moderate staining intensity and progesterone receptor, 0%  negative. Proliferation marker Ki67 at 40%. HER2 negative (1+).  The patient's subsequent history is as detailed below.   INTERVAL HISTORY: Emily Chambers returns today for follow-up and treatment of her estrogen positive and estrogen negative breast cancer. She is accompanied by a friend or family member.  She continues on neoadjuvant chemotherapy consistingof doxorubicin and cyclophosphamide in dose dense fashion x4 to be followed by weekly paclitaxel/carboplatin x12.  Today is cycle 4 day 8 of her chemotherapy.  She is feeling well today.   REVIEW OF SYSTEMS: Emily Chambers is doing better today than she was after cycle 3.  She is not as dehydrated and is able to eat and drink more than she was previously.  She does not feel like she needs IV fluids.  She has not had any nausea or vomiting.    Emily Chambers is less fatigued.  She denies any fevers or chills.  She is without any vision changes, unusual headaches, mucositis, dysphagia.  She hasn't had any bowel/bladder changes, chest pain, palpitations, cough, shortness of breath.  A detailed ROS was otherwise non contributory.     PAST MEDICAL HISTORY: Past Medical History:  Diagnosis Date  . Family history of breast cancer   . Family history of lung cancer   . Family history of throat cancer   . High cholesterol   . Hypertension   . Kidney stone   . Kidney stones   . UTI (lower urinary tract infection)     PAST SURGICAL HISTORY: Past Surgical History:  Procedure Laterality Date  . BREAST EXCISIONAL BIOPSY Left 1999,2005   cysts removed  . BREAST SURGERY Left    cyst and biopsy  . PORTACATH PLACEMENT Left 03/10/2018   Procedure: INSERTION PORT-A-CATH;  Surgeon: Stark Klein, MD;  Location: [facility];  Service: General;  Laterality:  Left;    FAMILY HISTORY: Family History  Problem Relation Age of Onset  . Hypertension Mother   . Heart disease Mother   . Dementia Mother   . Healthy Father   . Colon cancer Maternal Grandmother     . Cancer Paternal Grandmother        unk type  . Lung cancer Cousin        4s  . Breast cancer Cousin        71s  . Breast cancer Cousin        74s  . Breast cancer Cousin   . Cancer Paternal Aunt        unk type d. 64, possibly pancreatic  . Cancer Paternal Aunt        unk type d. 63s  . Cancer Paternal Uncle        unk type   . Cancer Paternal Uncle        unk type d. 82s  . Cancer Maternal Aunt        unk type, d. 44s  . Throat cancer Maternal Uncle        d. 1s  . Ovarian cancer Neg Hx    Patient father is alive at 45 years old. Patient mother died from heart disease and dementia at age 84.  The patient denies a family hx of ovarian cancer. She has 3 siblings, 1 brother and 2 sisters. She has a maternal cousin diagnosed with lung cancer in her 80s. She has 3 paternal cousins with breast cancer, one was diagnosed in her 17s and has passed away.   GYNECOLOGIC HISTORY:  No LMP recorded. Patient is postmenopausal. Menarche: 68 years old Age at first live birth: n/a GX P 0 LMP 2010 Contraceptive n/a HRT no  Hysterectomy? no BSo? no   SOCIAL HISTORY: She is single and works as a Scientist, water quality at [employer]). She lives alone, with no pets.    ADVANCED DIRECTIVES: Not in place.  At the 03/03/2018 visit she was given the appropriate documents to complete and notarize at her discretion.  She is planning to name her niece, [name], as her HCPOA.   HEALTH MAINTENANCE: Social History   Tobacco Use  . Smoking status: Never Smoker  . Smokeless tobacco: Never Used  Substance Use Topics  . Alcohol use: Yes    Comment: seldom  . Drug use: No     Colonoscopy: 2014? [facility]  PAP: 02/02/2018  Bone density: 03/15/2016, T-score 0.4, Dr. Alden Hipp   No Known Allergies  Current Outpatient Medications  Medication Sig Dispense Refill  . amLODipine (NORVASC) 10 MG tablet Take 10 mg by mouth every morning.     Marland Kitchen aspirin 81 MG  chewable tablet Chew 81 mg by mouth daily.    . ciprofloxacin (CIPRO) 500 MG tablet Take 1 tablet (500 mg total) by mouth 2 (two) times daily. 10 tablet 0  . dexamethasone (DECADRON) 4 MG tablet Take 2 tablets by mouth twice a day for three days starting the day after chemotherapy. Take with food. 30 tablet 1  . lidocaine-prilocaine (EMLA) cream Apply 1 application topically as needed. 30 g 0  . LORazepam (ATIVAN) 0.5 MG tablet Take 1 tablet (0.5 mg total) by mouth every 6 (six) hours as needed (Nausea or vomiting). 30 tablet 0  . losartan (COZAAR) 100 MG tablet Take 100 mg by mouth every morning.     Marland Kitchen omeprazole (PRILOSEC) 40 MG capsule Take 1  capsule (40 mg total) by mouth daily. 30 capsule 5  . oxyCODONE (OXY IR/ROXICODONE) 5 MG immediate release tablet Take 1 tablet (5 mg total) by mouth every 6 (six) hours as needed for severe pain. 10 tablet 0  . potassium chloride SA (K-DUR,KLOR-CON) 20 MEQ tablet Take 1 tablet (20 mEq total) by mouth daily. 5 tablet 0  . pravastatin (PRAVACHOL) 20 MG tablet Take 20 mg by mouth every morning.     . prochlorperazine (COMPAZINE) 10 MG tablet Take 1 tablet (10 mg total) by mouth every 6 (six) hours as needed (Nausea or vomiting). 30 tablet 1   No current facility-administered medications for this visit.     OBJECTIVE: Vitals:   05/04/18 1145  BP: 135/76  Pulse: 91  Resp: 19  Temp: 98.5 F (36.9 C)  SpO2: 100%     Body mass index is 32.79 kg/m.   Wt Readings from Last 3 Encounters:  05/04/18 179 lb 4.8 oz (81.3 kg)  04/27/18 178 lb (80.7 kg)  04/20/18 176 lb 11.2 oz (80.2 kg)  ECOG FS:1 GENERAL: Patient is a well appearing female in no acute distress HEENT:  Sclerae anicteric.  Oropharynx clear and moist. No ulcerations or evidence of oropharyngeal candidiasis. Neck is supple.  NODES:  No cervical, supraclavicular, or axillary lymphadenopathy palpated.  BREAST EXAM:  Deferred. LUNGS:  Clear to auscultation bilaterally.  No wheezes or  rhonchi. HEART:  Regular rate and rhythm. No murmur appreciated. ABDOMEN:  Soft, nontender.  Positive, normoactive bowel sounds. No organomegaly palpated. MSK:  No focal spinal tenderness to palpation. Full range of motion bilaterally in the upper extremities. EXTREMITIES:  No peripheral edema.   SKIN:  Clear with no obvious rashes or skin changes. No nail dyscrasia. NEURO:  Nonfocal. Well oriented.  Appropriate affect.     LAB RESULTS:  CMP     Component Value Date/Time   NA 143 04/27/2018 1057   K 3.2 (L) 04/27/2018 1057   CL 108 04/27/2018 1057   CO2 26 04/27/2018 1057   GLUCOSE 172 (H) 04/27/2018 1057   BUN 12 04/27/2018 1057   CREATININE 0.72 04/27/2018 1057   CALCIUM 8.9 04/27/2018 1057   PROT 6.7 04/27/2018 1057   ALBUMIN 3.7 04/27/2018 1057   AST 13 (L) 04/27/2018 1057   ALT 22 04/27/2018 1057   ALKPHOS 115 04/27/2018 1057   BILITOT 0.3 04/27/2018 1057   GFRNONAA >60 04/27/2018 1057   GFRAA >60 04/27/2018 1057    No results found for: TOTALPROTELP, ALBUMINELP, A1GS, A2GS, BETS, BETA2SER, GAMS, MSPIKE, SPEI  No results found for: KPAFRELGTCHN, LAMBDASER, KAPLAMBRATIO  Lab Results  Component Value Date   WBC 11.9 (H) 05/04/2018   NEUTROABS PENDING 05/04/2018   HGB 8.5 (L) 05/04/2018   HCT 27.8 (L) 05/04/2018   MCV 89.7 05/04/2018   PLT 171 05/04/2018    '@LASTCHEMISTRY'$ @  No results found for: LABCA2  No components found for: [id number]  No results for input(s): INR in the last 168 hours.  No results found for: LABCA2  No results found for: NFA213  No results found for: YQM578  No results found for: ION629  No results found for: CA2729  No components found for: HGQUANT  No results found for: CEA1 / No results found for: CEA1   No results found for: AFPTUMOR  No results found for: CHROMOGRNA  No results found for: PSA1  Appointment on 05/04/2018  Component Date Value Ref Range Status  . WBC Count 05/04/2018 11.9* 4.0 - 10.5  K/uL Final   . RBC 05/04/2018 3.10* 3.87 - 5.11 MIL/uL Final  . Hemoglobin 05/04/2018 8.5* 12.0 - 15.0 g/dL Final  . HCT 05/04/2018 27.8* 36.0 - 46.0 % Final  . MCV 05/04/2018 89.7  80.0 - 100.0 fL Final  . MCH 05/04/2018 27.4  26.0 - 34.0 pg Final  . MCHC 05/04/2018 30.6  30.0 - 36.0 g/dL Final  . RDW 05/04/2018 15.3  11.5 - 15.5 % Final  . Platelet Count 05/04/2018 171  150 - 400 K/uL Final  . nRBC 05/04/2018 0.0  0.0 - 0.2 % Final   Performed at [facility], [place] [address] [place], [place] [address]  . Neutrophils Relative % 05/04/2018 PENDING  % Incomplete  . Neutro Abs 05/04/2018 PENDING  1.7 - 7.7 K/uL Incomplete  . Band Neutrophils 05/04/2018 PENDING  % Incomplete  . Lymphocytes Relative 05/04/2018 PENDING  % Incomplete  . Lymphs Abs 05/04/2018 PENDING  0.7 - 4.0 K/uL Incomplete  . Monocytes Relative 05/04/2018 PENDING  % Incomplete  . Monocytes Absolute 05/04/2018 PENDING  0.1 - 1.0 K/uL Incomplete  . Eosinophils Relative 05/04/2018 PENDING  % Incomplete  . Eosinophils Absolute 05/04/2018 PENDING  0.0 - 0.5 K/uL Incomplete  . Basophils Relative 05/04/2018 PENDING  % Incomplete  . Basophils Absolute 05/04/2018 PENDING  0.0 - 0.1 K/uL Incomplete  . WBC Morphology 05/04/2018 PENDING   Incomplete  . RBC Morphology 05/04/2018 PENDING   Incomplete  . Smear Review 05/04/2018 PENDING   Incomplete  . Other 05/04/2018 PENDING  % Incomplete  . nRBC 05/04/2018 PENDING  0 /100 WBC Incomplete  . Metamyelocytes Relative 05/04/2018 PENDING  % Incomplete  . Myelocytes 05/04/2018 PENDING  % Incomplete  . Promyelocytes Relative 05/04/2018 PENDING  % Incomplete  . Blasts 05/04/2018 PENDING  % Incomplete    (this displays the last labs from the last 3 days)  No results found for: TOTALPROTELP, ALBUMINELP, A1GS, A2GS, BETS, BETA2SER, GAMS, MSPIKE, SPEI (this displays SPEP labs)  No results found for: KPAFRELGTCHN, LAMBDASER, KAPLAMBRATIO (kappa/lambda light chains)  No  results found for: HGBA, HGBA2QUANT, HGBFQUANT, HGBSQUAN (Hemoglobinopathy evaluation)   No results found for: LDH  No results found for: IRON, TIBC, IRONPCTSAT (Iron and TIBC)  No results found for: FERRITIN  Urinalysis    Component Value Date/Time   COLORURINE YELLOW 04/12/2018 1504   APPEARANCEUR CLEAR 04/12/2018 1504   LABSPEC 1.020 04/12/2018 1504   PHURINE 5.0 04/12/2018 1504   GLUCOSEU NEGATIVE 04/12/2018 1504   HGBUR NEGATIVE 04/12/2018 1504   BILIRUBINUR NEGATIVE 04/12/2018 1504   KETONESUR NEGATIVE 04/12/2018 1504   PROTEINUR NEGATIVE 04/12/2018 1504   UROBILINOGEN 0.2 07/07/2012 1159   NITRITE NEGATIVE 04/12/2018 1504   LEUKOCYTESUR TRACE (A) 04/12/2018 1504     STUDIES: No results found.   ELIGIBLE FOR AVAILABLE RESEARCH PROTOCOL: Upbeat   ASSESSMENT: 68 y.o. 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, grade 2, estrogen and progesterone receptor positive, HER-2 not amplified, with an MIB-1 of 5%  (b) in the lower outer quadrant a clinical T1c N0, stage IA-B invasive ductal carcinoma, grade 2, estrogen receptor only moderately positive at 10%, progesterone receptor negative, with an MIB-1 of 40%, and HER-2 not amplified  (1) neoadjuvant chemotherapy will consist of doxorubicin and cyclophosphamide in dose dense fashion x4 starting 03/16/2018, followed by paclitaxel and carboplatin weekly x12  (a) echo on 03/11/2018 shows  well preserved EF of 60-65%  (2) definitive surgery to follow  (3) adjuvant radiation as appropriate  (4) antiestrogens to follow at the completion of local treatment (for upper outer quadrant tumor)  (5) genetics testing on 03/24/2018 showed no pathogenic mutations.  Genes tested include:  APC, ATM, AXIN2, BARD1, BMPR1A, BRCA1, BRCA2, BRIP1, CDH1, CDKN2A (p14ARF), CDKN2A (p16INK4a), CKD4, CHEK2, CTNNA1, DICER1, EPCAM  (Deletion/duplication testing only), GREM1 (promoter region deletion/duplication testing only), KIT, MEN1, MLH1, MSH2, MSH3, MSH6, MUTYH, NBN, NF1, NHTL1, PALB2, PDGFRA, PMS2, POLD1, POLE, PTEN, RAD50, RAD51C, RAD51D, SDHB, SDHC, SDHD, SMAD4, SMARCA4. STK11, TP53, TSC1, TSC2, and VHL.  The following genes were evaluated for sequence changes only: SDHA and HOXB13 c.251G>A variant only.   PLAN: Emily Chambers is feeling well today and has completed her four cycles of Doxorubicin and Cyclophosphamide.  Her CBC is stable, her weight is stable, and her blood pressure is improved as compared to her last day 8 visit after her third cycle of treatment.  She does not feel like she needs IV fluids today, and clinically she doesn't need them since all of her numbers are improved.    She will start Paclitaxel and Carboplatin next week.  We reviewed risks and benefits of this treatment in detail.  She understands that it is a weekly chemotherapy.  I went ahead and requested her scheduling of her chemotherapy treatments for the rest of her Paclitaxel and Carboplatin chemotherapy regimen and explained to her that we will see her week 1, week 2, then every other week until week 8, then we will see her weekly through the end of her treatment.    Emily Chambers will return next week for labs, f/u with Dr. Jana Hakim, and Paclitaxel/Carboplatin.  She knows to call for any other issues that may develop before the next visit.  A total of (20) minutes of face-to-face time was spent with this patient with greater than 50% of that time in counseling and care-coordination.   Wilber Bihari, NP  05/04/18 11:55 AM Medical Oncology and Hematology [facility] [address] [place], [place] [address] Tel. [phone]    Fax. [phone]

## 2018-05-10 ENCOUNTER — Other Ambulatory Visit: Payer: Self-pay | Admitting: *Deleted

## 2018-05-10 DIAGNOSIS — Z171 Estrogen receptor negative status [ER-]: Principal | ICD-10-CM

## 2018-05-10 DIAGNOSIS — C50512 Malignant neoplasm of lower-outer quadrant of left female breast: Secondary | ICD-10-CM

## 2018-05-10 NOTE — Progress Notes (Signed)
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 She underwent bilateral diagnostic mammography with tomography and left breast ultrasonography at [facility] on 02/16/2018 showing: breast density category C; two left breast masses, one at 2 o'clock and the other at 3:30 o'clock. The 2 o'clock mass (1.3 x 1 x 1 cm) corresponds to the mammographic abnormality and is highly suspicious for breast carcinoma. The other mass (1.2 x 0.7 x 1.2 cm)  is suspicious for breast carcinoma. No left axillary adenopathy.   Accordingly on 02/19/2018 she proceeded to biopsy of the left breast area in question. 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 Prognostic indicators significant for: estrogen receptor, 90% positive and progesterone receptor, 95% positive, both with strong staining intensity. Proliferation marker Ki67 at 5%. HER2 negative (1+).   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 Prognostic indicators significant for: estrogen receptor, 10% positive with moderate staining intensity and progesterone receptor, 0%  negative. Proliferation marker Ki67 at 40%. HER2 negative (1+).  The patient's subsequent history is as detailed below.   INTERVAL HISTORY: Emily Chambers returns today for follow-up and treatment of her estrogen positive and estrogen negative breast cancer.   She completed doxorubicin and cyclophosphamide in dose dense fashion x4. She had not mouth sores. She did have some headaches, but she says that she wasn't drinking enough water. She only had some occasional nausea, but it was very rare.   She will start paclitaxel and carboplatin weekly x12 today.   Since her last visit here, she has not undergone any additional studies.     REVIEW OF SYSTEMS: Emily Chambers has noticed some arthritic pain in her knees and ankles. She is not currently working and she stays home frequently. The patient denies unusual headaches, visual changes, vomiting, or dizziness. There has been no unusual cough, phlegm production, or pleurisy. This been no change in bowel or bladder habits. The patient denies unexplained fatigue or unexplained weight loss, bleeding, rash, or fever. A detailed review of systems was otherwise noncontributory.    PAST MEDICAL HISTORY: Past Medical History:  Diagnosis Date  . Family history of breast cancer   . Family history of lung cancer   . Family history of throat cancer   . High cholesterol   . Hypertension   . Kidney stone   . Kidney stones   . UTI (lower urinary tract infection)     PAST SURGICAL HISTORY: Past Surgical History:  Procedure Laterality Date  . BREAST EXCISIONAL BIOPSY Left 1999,2005   cysts removed  . BREAST SURGERY Left    cyst and biopsy  . PORTACATH PLACEMENT Left 03/10/2018   Procedure: INSERTION PORT-A-CATH;  Surgeon: Stark Klein, MD;  Location: [facility];  Service: General;  Laterality: Left;  FAMILY HISTORY: Family History  Problem Relation Age of Onset  . Hypertension Mother   . Heart disease Mother   . Dementia Mother   . Healthy  Father   . Colon cancer Maternal Grandmother   . Cancer Paternal Grandmother        unk type  . Lung cancer Cousin        46s  . Breast cancer Cousin        53s  . Breast cancer Cousin        16s  . Breast cancer Cousin   . Cancer Paternal Aunt        unk type d. 74, possibly pancreatic  . Cancer Paternal Aunt        unk type d. 30s  . Cancer Paternal Uncle        unk type   . Cancer Paternal Uncle        unk type d. 73s  . Cancer Maternal Aunt        unk type, d. 57s  . Throat cancer Maternal Uncle        d. 81s  . Ovarian cancer Neg Hx    Patient father is alive at 49 years old. Patient mother died from heart disease and dementia at age 67.  The patient denies a family hx of ovarian cancer. She has 3 siblings, 1 brother and 2 sisters. She has a maternal cousin diagnosed with lung cancer in her 67s. She has 3 paternal cousins with breast cancer, one was diagnosed in her 67s and has passed away.   GYNECOLOGIC HISTORY:  No LMP recorded. Patient is postmenopausal. Menarche: 68 years old Age at first live birth: n/a GX P 0 LMP 2010 Contraceptive n/a HRT no  Hysterectomy? no BSo? no   SOCIAL HISTORY: She is single and works as a Scientist, water quality at [employer]). She lives alone, with no pets.    ADVANCED DIRECTIVES: Not in place.  At the 03/03/2018 visit she was given the appropriate documents to complete and notarize at her discretion.  She is planning to name her niece, [name], as her HCPOA.   HEALTH MAINTENANCE: Social History   Tobacco Use  . Smoking status: Never Smoker  . Smokeless tobacco: Never Used  Substance Use Topics  . Alcohol use: Yes    Comment: seldom  . Drug use: No     Colonoscopy: 2014? [facility]  PAP: 02/02/2018  Bone density: 03/15/2016, T-score 0.4, Dr. Alden Hipp   No Known Allergies  Current Outpatient Medications  Medication Sig Dispense Refill  . amLODipine (NORVASC) 10 MG tablet Take 10 mg by  mouth every morning.     Marland Kitchen aspirin 81 MG chewable tablet Chew 81 mg by mouth daily.    . ciprofloxacin (CIPRO) 500 MG tablet Take 1 tablet (500 mg total) by mouth 2 (two) times daily. 10 tablet 0  . dexamethasone (DECADRON) 4 MG tablet Take 2 tablets by mouth twice a day for three days starting the day after chemotherapy. Take with food. 30 tablet 1  . lidocaine-prilocaine (EMLA) cream Apply 1 application topically as needed. 30 g 0  . LORazepam (ATIVAN) 0.5 MG tablet Take 1 tablet (0.5 mg total) by mouth every 6 (six) hours as needed (Nausea or vomiting). 30 tablet 0  . losartan (COZAAR) 100 MG tablet Take 100 mg by mouth every morning.     Marland Kitchen omeprazole (PRILOSEC) 40 MG capsule Take 1 capsule (40 mg total) by  mouth daily. 30 capsule 5  . oxyCODONE (OXY IR/ROXICODONE) 5 MG immediate release tablet Take 1 tablet (5 mg total) by mouth every 6 (six) hours as needed for severe pain. 10 tablet 0  . potassium chloride SA (K-DUR,KLOR-CON) 20 MEQ tablet Take 1 tablet (20 mEq total) by mouth daily. 5 tablet 0  . pravastatin (PRAVACHOL) 20 MG tablet Take 20 mg by mouth every morning.     . prochlorperazine (COMPAZINE) 10 MG tablet Take 1 tablet (10 mg total) by mouth every 6 (six) hours as needed (Nausea or vomiting). 30 tablet 1   No current facility-administered medications for this visit.     OBJECTIVE: Middle-aged African-American woman in no acute distress Vitals:   05/11/18 1010  BP: (!) 143/87  Pulse: (!) 113  Resp: 18  Temp: 98.4 F (36.9 C)  SpO2: 99%     Body mass index is 32.21 kg/m.   Wt Readings from Last 3 Encounters:  05/11/18 176 lb 1.6 oz (79.9 kg)  05/04/18 179 lb 4.8 oz (81.3 kg)  04/27/18 178 lb (80.7 kg)  ECOG FS:1  Sclerae unicteric, EOMs intact Oropharynx clear and moist No cervical or supraclavicular adenopathy Lungs no rales or rhonchi Heart regular rate and rhythm Abd soft, nontender, positive bowel sounds MSK no focal spinal tenderness, no upper extremity  lymphedema Neuro: nonfocal, well oriented, appropriate affect Breasts: Deferred     LAB RESULTS:  CMP     Component Value Date/Time   NA 143 05/11/2018 0947   K 3.3 (L) 05/11/2018 0947   CL 106 05/11/2018 0947   CO2 23 05/11/2018 0947   GLUCOSE 189 (H) 05/11/2018 0947   BUN 10 05/11/2018 0947   CREATININE 0.80 05/11/2018 0947   CALCIUM 9.2 05/11/2018 0947   PROT 6.9 05/11/2018 0947   ALBUMIN 3.7 05/11/2018 0947   AST 12 (L) 05/11/2018 0947   ALT 21 05/11/2018 0947   ALKPHOS 141 (H) 05/11/2018 0947   BILITOT 0.3 05/11/2018 0947   GFRNONAA >60 05/11/2018 0947   GFRAA >60 05/11/2018 0947    No results found for: TOTALPROTELP, ALBUMINELP, A1GS, A2GS, BETS, BETA2SER, GAMS, MSPIKE, SPEI  No results found for: KPAFRELGTCHN, LAMBDASER, KAPLAMBRATIO  Lab Results  Component Value Date   WBC 17.9 (H) 05/11/2018   NEUTROABS PENDING 05/11/2018   HGB 9.5 (L) 05/11/2018   HCT 31.3 (L) 05/11/2018   MCV 92.3 05/11/2018   PLT 148 (L) 05/11/2018    _0 @  No results found for: LABCA2  No components found for: WSFKCL275  No results for input(s): INR in the last 168 hours.  No results found for: LABCA2  No results found for: TZG017  No results found for: CBS496  No results found for: PRF163  No results found for: CA2729  No components found for: HGQUANT  No results found for: CEA1 / No results found for: CEA1   No results found for: AFPTUMOR  No results found for: CHROMOGRNA  No results found for: PSA1  Appointment on 05/11/2018  Component Date Value Ref Range Status  . WBC Count 05/11/2018 17.9* 4.0 - 10.5 K/uL Final  . RBC 05/11/2018 3.39* 3.87 - 5.11 MIL/uL Final  . Hemoglobin 05/11/2018 9.5* 12.0 - 15.0 g/dL Final  . HCT 05/11/2018 31.3* 36.0 - 46.0 % Final  . MCV 05/11/2018 92.3  80.0 - 100.0 fL Final  . MCH 05/11/2018 28.0  26.0 - 34.0 pg Final  . MCHC 05/11/2018 30.4  30.0 - 36.0 g/dL Final  . RDW 05/11/2018  16.6* 11.5 - 15.5 % Final  .  Platelet Count 05/11/2018 148* 150 - 400 K/uL Final  . nRBC 05/11/2018 0.7* 0.0 - 0.2 % Final   Performed at [facility], [place] [address], [place], [place] [address]  . Neutrophils Relative % 05/11/2018 PENDING  % Incomplete  . Neutro Abs 05/11/2018 PENDING  1.7 - 7.7 K/uL Incomplete  . Band Neutrophils 05/11/2018 PENDING  % Incomplete  . Lymphocytes Relative 05/11/2018 PENDING  % Incomplete  . Lymphs Abs 05/11/2018 PENDING  0.7 - 4.0 K/uL Incomplete  . Monocytes Relative 05/11/2018 PENDING  % Incomplete  . Monocytes Absolute 05/11/2018 PENDING  0.1 - 1.0 K/uL Incomplete  . Eosinophils Relative 05/11/2018 PENDING  % Incomplete  . Eosinophils Absolute 05/11/2018 PENDING  0.0 - 0.5 K/uL Incomplete  . Basophils Relative 05/11/2018 PENDING  % Incomplete  . Basophils Absolute 05/11/2018 PENDING  0.0 - 0.1 K/uL Incomplete  . WBC Morphology 05/11/2018 PENDING   Incomplete  . RBC Morphology 05/11/2018 PENDING   Incomplete  . Smear Review 05/11/2018 PENDING   Incomplete  . Other 05/11/2018 PENDING  % Incomplete  . nRBC 05/11/2018 PENDING  0 /100 WBC Incomplete  . Metamyelocytes Relative 05/11/2018 PENDING  % Incomplete  . Myelocytes 05/11/2018 PENDING  % Incomplete  . Promyelocytes Relative 05/11/2018 PENDING  % Incomplete  . Blasts 05/11/2018 PENDING  % Incomplete  . Sodium 05/11/2018 143  135 - 145 mmol/L Final  . Potassium 05/11/2018 3.3* 3.5 - 5.1 mmol/L Final  . Chloride 05/11/2018 106  98 - 111 mmol/L Final  . CO2 05/11/2018 23  22 - 32 mmol/L Final  . Glucose, Bld 05/11/2018 189* 70 - 99 mg/dL Final  . BUN 05/11/2018 10  8 - 23 mg/dL Final  . Creatinine 05/11/2018 0.80  0.44 - 1.00 mg/dL Final  . Calcium 05/11/2018 9.2  8.9 - 10.3 mg/dL Final  . Total Protein 05/11/2018 6.9  6.5 - 8.1 g/dL Final  . Albumin 05/11/2018 3.7  3.5 - 5.0 g/dL Final  . AST 05/11/2018 12* 15 - 41 U/L Final  . ALT 05/11/2018 21  0 - 44 U/L Final  . Alkaline Phosphatase 05/11/2018 141*  38 - 126 U/L Final  . Total Bilirubin 05/11/2018 0.3  0.3 - 1.2 mg/dL Final  . GFR, Est Non Af Am 05/11/2018 >60  >60 mL/min Final  . GFR, Est AFR Am 05/11/2018 >60  >60 mL/min Final  . Anion gap 05/11/2018 14  5 - 15 Final   Performed at [facility], [address] [place], [place] [address]    (this displays the last labs from the last 3 days)  No results found for: TOTALPROTELP, ALBUMINELP, A1GS, A2GS, BETS, BETA2SER, GAMS, MSPIKE, SPEI (this displays SPEP labs)  No results found for: KPAFRELGTCHN, LAMBDASER, KAPLAMBRATIO (kappa/lambda light chains)  No results found for: HGBA, HGBA2QUANT, HGBFQUANT, HGBSQUAN (Hemoglobinopathy evaluation)   No results found for: LDH  No results found for: IRON, TIBC, IRONPCTSAT (Iron and TIBC)  No results found for: FERRITIN  Urinalysis    Component Value Date/Time   COLORURINE YELLOW 04/12/2018 1504   APPEARANCEUR CLEAR 04/12/2018 1504   LABSPEC 1.020 04/12/2018 1504   PHURINE 5.0 04/12/2018 1504   GLUCOSEU NEGATIVE 04/12/2018 1504   HGBUR NEGATIVE 04/12/2018 1504   BILIRUBINUR NEGATIVE 04/12/2018 1504   KETONESUR NEGATIVE 04/12/2018 [place] 04/12/2018 1504   UROBILINOGEN 0.2 07/07/2012 1159   NITRITE NEGATIVE 04/12/2018 [place] (  A) 04/12/2018 1504     STUDIES: No results found.   ELIGIBLE FOR AVAILABLE RESEARCH PROTOCOL: Upbeat   ASSESSMENT: 68 y.o. 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, grade 2, estrogen and progesterone receptor positive, HER-2 not amplified, with an MIB-1 of 5%  (b) in the lower outer quadrant a clinical T1c N0, stage IA-B invasive ductal carcinoma, grade 2, estrogen receptor only moderately positive at 10%, progesterone receptor negative, with an MIB-1 of 40%, and HER-2 not amplified  (1) neoadjuvant  chemotherapy will consist of doxorubicin and cyclophosphamide in dose dense fashion x4 starting 03/16/2018, followed by paclitaxel and carboplatin weekly x12  (a) echo on 03/11/2018 shows well preserved EF of 60-65%  (2) definitive surgery to follow  (3) adjuvant radiation as appropriate  (4) antiestrogens to follow at the completion of local treatment (for upper outer quadrant tumor)  (5) genetics testing on 03/24/2018 showed no pathogenic mutations.  Genes tested include:  APC, ATM, AXIN2, BARD1, BMPR1A, BRCA1, BRCA2, BRIP1, CDH1, CDKN2A (p14ARF), CDKN2A (p16INK4a), CKD4, CHEK2, CTNNA1, DICER1, EPCAM (Deletion/duplication testing only), GREM1 (promoter region deletion/duplication testing only), KIT, MEN1, MLH1, MSH2, MSH3, MSH6, MUTYH, NBN, NF1, NHTL1, PALB2, PDGFRA, PMS2, POLD1, POLE, PTEN, RAD50, RAD51C, RAD51D, SDHB, SDHC, SDHD, SMAD4, SMARCA4. STK11, TP53, TSC1, TSC2, and VHL.  The following genes were evaluated for sequence changes only: SDHA and HOXB13 c.251G>A variant only.   PLAN: Emily Chambers did remarkably well with her doxorubicin and cyclophosphamide treatments and is now ready to start the paclitaxel and carboplatin weekly doses.  Hopefully this will be easier on her although sometimes we do have to deal with first dose reactions.  The most important concern of course is neuropathy and I reviewed that in detail with her.  We are going to be seeing her again in 1 week to make sure she tolerated the first dose well and if all is well then we will see her every other treatment until she gets to the eighth treatment after which we will see her weekly because of neuropathy concerns  She was really not taking any antinausea medication with the CA treatments but I suggested if she has any nausea at all to go ahead and take the medication now so she does not develop persistent nausea or vomiting or becomes sensitized  She lives by herself, but she does say family comes by regularly which is  good.  She is quite aware of the concerns regarding the coronavirus and is taking appropriate precautions  She knows to call for any other issue that may develop before the next visit.   , Virgie Dad, MD  05/11/18 10:39 AM Medical Oncology and Hematology [facility] [address] [place], [place] [address] Tel. [phone]    Fax. [phone]  I, Jacqualyn Posey am acting as a Education administrator for Chauncey Cruel, MD.   I, Lurline Del MD, have reviewed the above documentation for accuracy and completeness, and I agree with the above.

## 2018-05-11 ENCOUNTER — Inpatient Hospital Stay: Payer: Medicare Other

## 2018-05-11 ENCOUNTER — Inpatient Hospital Stay: Payer: Medicare Other | Admitting: Oncology

## 2018-05-11 ENCOUNTER — Other Ambulatory Visit: Payer: Self-pay

## 2018-05-11 VITALS — BP 143/87 | HR 113 | Temp 98.4°F | Resp 18 | Ht 62.0 in | Wt 176.1 lb

## 2018-05-11 VITALS — BP 133/74 | HR 86 | Temp 98.1°F | Resp 16

## 2018-05-11 DIAGNOSIS — C50512 Malignant neoplasm of lower-outer quadrant of left female breast: Secondary | ICD-10-CM | POA: Diagnosis not present

## 2018-05-11 DIAGNOSIS — Z170 Estrogen receptor positive status [ER+]: Secondary | ICD-10-CM

## 2018-05-11 DIAGNOSIS — Z171 Estrogen receptor negative status [ER-]: Secondary | ICD-10-CM | POA: Diagnosis not present

## 2018-05-11 DIAGNOSIS — Z95828 Presence of other vascular implants and grafts: Secondary | ICD-10-CM

## 2018-05-11 DIAGNOSIS — Z79899 Other long term (current) drug therapy: Secondary | ICD-10-CM

## 2018-05-11 DIAGNOSIS — Z5111 Encounter for antineoplastic chemotherapy: Secondary | ICD-10-CM | POA: Diagnosis not present

## 2018-05-11 DIAGNOSIS — C50412 Malignant neoplasm of upper-outer quadrant of left female breast: Secondary | ICD-10-CM

## 2018-05-11 DIAGNOSIS — N184 Chronic kidney disease, stage 4 (severe): Secondary | ICD-10-CM

## 2018-05-11 DIAGNOSIS — Z7982 Long term (current) use of aspirin: Secondary | ICD-10-CM

## 2018-05-11 LAB — CBC WITH DIFFERENTIAL (CANCER CENTER ONLY)
Abs Immature Granulocytes: 1.1 10*3/uL — ABNORMAL HIGH (ref 0.00–0.07)
Band Neutrophils: 19 %
Basophils Absolute: 0.2 10*3/uL — ABNORMAL HIGH (ref 0.0–0.1)
Basophils Relative: 1 %
EOS PCT: 1 %
Eosinophils Absolute: 0.2 10*3/uL (ref 0.0–0.5)
HCT: 31.3 % — ABNORMAL LOW (ref 36.0–46.0)
Hemoglobin: 9.5 g/dL — ABNORMAL LOW (ref 12.0–15.0)
Lymphocytes Relative: 9 %
Lymphs Abs: 1.6 10*3/uL (ref 0.7–4.0)
MCH: 28 pg (ref 26.0–34.0)
MCHC: 30.4 g/dL (ref 30.0–36.0)
MCV: 92.3 fL (ref 80.0–100.0)
MONO ABS: 0.7 10*3/uL (ref 0.1–1.0)
Metamyelocytes Relative: 4 %
Monocytes Relative: 4 %
Myelocytes: 2 %
Neutro Abs: 14.1 10*3/uL (ref 1.7–17.7)
Neutrophils Relative %: 60 %
Platelet Count: 148 10*3/uL — ABNORMAL LOW (ref 150–400)
RBC: 3.39 MIL/uL — ABNORMAL LOW (ref 3.87–5.11)
RDW: 16.6 % — ABNORMAL HIGH (ref 11.5–15.5)
WBC Count: 17.9 10*3/uL — ABNORMAL HIGH (ref 4.0–10.5)
nRBC: 0.7 % — ABNORMAL HIGH (ref 0.0–0.2)

## 2018-05-11 LAB — CMP (CANCER CENTER ONLY)
ALK PHOS: 141 U/L — AB (ref 38–126)
ALT: 21 U/L (ref 0–44)
AST: 12 U/L — ABNORMAL LOW (ref 15–41)
Albumin: 3.7 g/dL (ref 3.5–5.0)
Anion gap: 14 (ref 5–15)
BUN: 10 mg/dL (ref 8–23)
CO2: 23 mmol/L (ref 22–32)
CREATININE: 0.8 mg/dL (ref 0.44–1.00)
Calcium: 9.2 mg/dL (ref 8.9–10.3)
Chloride: 106 mmol/L (ref 98–111)
GFR, Est AFR Am: >60 mL/min (ref >60)
GFR, Estimated: >60 mL/min (ref >60)
Glucose, Bld: 189 mg/dL — ABNORMAL HIGH (ref 70–99)
Potassium: 3.3 mmol/L — ABNORMAL LOW (ref 3.5–5.1)
Sodium: 143 mmol/L (ref 135–145)
TOTAL PROTEIN: 6.9 g/dL (ref 6.5–8.1)
Total Bilirubin: 0.3 mg/dL (ref 0.3–1.2)

## 2018-05-11 MED ORDER — PALONOSETRON HCL INJECTION 0.25 MG/5ML
INTRAVENOUS | Status: AC
  Filled 2018-05-11: qty 5

## 2018-05-11 MED ORDER — SODIUM CHLORIDE 0.9% FLUSH
10.0000 mL | Freq: Once | INTRAVENOUS | Status: AC
  Administered 2018-05-11: 10 mL
  Filled 2018-05-11: qty 10

## 2018-05-11 MED ORDER — SODIUM CHLORIDE 0.9 % IV SOLN
187.2000 mg | Freq: Once | INTRAVENOUS | Status: AC
  Administered 2018-05-11: 190 mg via INTRAVENOUS
  Filled 2018-05-11: qty 19

## 2018-05-11 MED ORDER — SODIUM CHLORIDE 0.9 % IV SOLN
Freq: Once | INTRAVENOUS | Status: AC
  Administered 2018-05-11: 11:00:00 via INTRAVENOUS
  Filled 2018-05-11: qty 250

## 2018-05-11 MED ORDER — SODIUM CHLORIDE 0.9% FLUSH
10.0000 mL | INTRAVENOUS | Status: DC | PRN
  Administered 2018-05-11: 10 mL
  Filled 2018-05-11: qty 10

## 2018-05-11 MED ORDER — SODIUM CHLORIDE 0.9 % IV SOLN
20.0000 mg | Freq: Once | INTRAVENOUS | Status: AC
  Administered 2018-05-11: 20 mg via INTRAVENOUS
  Filled 2018-05-11: qty 2

## 2018-05-11 MED ORDER — SODIUM CHLORIDE 0.9 % IV SOLN
80.0000 mg/m2 | Freq: Once | INTRAVENOUS | Status: AC
  Administered 2018-05-11: 150 mg via INTRAVENOUS
  Filled 2018-05-11: qty 25

## 2018-05-11 MED ORDER — SODIUM CHLORIDE 0.9 % IV SOLN
12.0000 mg | Freq: Once | INTRAVENOUS | Status: AC
  Administered 2018-05-11: 12 mg via INTRAVENOUS
  Filled 2018-05-11: qty 1.2

## 2018-05-11 MED ORDER — PALONOSETRON HCL INJECTION 0.25 MG/5ML
0.2500 mg | Freq: Once | INTRAVENOUS | Status: AC
  Administered 2018-05-11: 0.25 mg via INTRAVENOUS

## 2018-05-11 MED ORDER — FAMOTIDINE IN NACL 20-0.9 MG/50ML-% IV SOLN: 20.0000 mg | Freq: Once | INTRAVENOUS | Status: DC

## 2018-05-11 MED ORDER — DIPHENHYDRAMINE HCL 50 MG/ML IJ SOLN
25.0000 mg | Freq: Once | INTRAMUSCULAR | Status: AC
  Administered 2018-05-11: 25 mg via INTRAVENOUS

## 2018-05-11 MED ORDER — DIPHENHYDRAMINE HCL 50 MG/ML IJ SOLN
INTRAMUSCULAR | Status: AC
  Filled 2018-05-11: qty 1

## 2018-05-11 MED ORDER — HEPARIN SOD (PORK) LOCK FLUSH 100 UNIT/ML IV SOLN
500.0000 [IU] | Freq: Once | INTRAVENOUS | Status: AC | PRN
  Administered 2018-05-11: 500 [IU]
  Filled 2018-05-11: qty 5

## 2018-05-11 NOTE — Patient Instructions (Signed)
[facility]cer Center Discharge Instructions for Patients Receiving Chemotherapy  Today you received the following chemotherapy agents Taxol and Carboplatin  To help prevent nausea and vomiting after your treatment, we encourage you to take your nausea medication as directed If you develop nausea and vomiting that is not controlled by your nausea medication, call the clinic.   BELOW ARE SYMPTOMS THAT SHOULD BE REPORTED IMMEDIATELY:  *FEVER GREATER THAN 100.5 F  *CHILLS WITH OR WITHOUT FEVER  NAUSEA AND VOMITING THAT IS NOT CONTROLLED WITH YOUR NAUSEA MEDICATION  *UNUSUAL SHORTNESS OF BREATH  *UNUSUAL BRUISING OR BLEEDING  TENDERNESS IN MOUTH AND THROAT WITH OR WITHOUT PRESENCE OF ULCERS  *URINARY PROBLEMS  *BOWEL PROBLEMS  UNUSUAL RASH Items with * indicate a potential emergency and should be followed up as soon as possible.  Feel free to call the clinic should you have any questions or concerns. The clinic phone number is [phone].  Please show the CHEMO ALERT CARD at check-in to the Emergency Department and triage nurse.   Paclitaxel (Taxol) injection What is this medicine? PACLITAXEL (PAK li TAX el) is a chemotherapy drug. It targets fast dividing cells, like cancer cells, and causes these cells to die. This medicine is used to treat ovarian cancer, breast cancer, lung cancer, Kaposi's sarcoma, and other cancers. This medicine may be used for other purposes; ask your health care provider or pharmacist if you have questions. COMMON BRAND NAME(S): Onxol, Taxol What should I tell my health care provider before I take this medicine? They need to know if you have any of these conditions: -history of irregular heartbeat -liver disease -low blood counts, like low white cell, platelet, or red cell counts -lung or breathing disease, like asthma -tingling of the fingers or toes, or other nerve disorder -an unusual or allergic reaction to paclitaxel, alcohol,  polyoxyethylated castor oil, other chemotherapy, other medicines, foods, dyes, or preservatives -pregnant or trying to get pregnant -breast-feeding How should I use this medicine? This drug is given as an infusion into a vein. It is administered in a hospital or clinic by a specially trained health care professional. Talk to your pediatrician regarding the use of this medicine in children. Special care may be needed. Overdosage: If you think you have taken too much of this medicine contact a poison control center or emergency room at once. NOTE: This medicine is only for you. Do not share this medicine with others. What if I miss a dose? It is important not to miss your dose. Call your doctor or health care professional if you are unable to keep an appointment. What may interact with this medicine? Do not take this medicine with any of the following medications: -disulfiram -metronidazole This medicine may also interact with the following medications: -antiviral medicines for hepatitis, HIV or AIDS -certain antibiotics like erythromycin and clarithromycin -certain medicines for fungal infections like ketoconazole and itraconazole -certain medicines for seizures like carbamazepine, phenobarbital, phenytoin -gemfibrozil -nefazodone -rifampin -St. John's wort This list may not describe all possible interactions. Give your health care provider a list of all the medicines, herbs, non-prescription drugs, or dietary supplements you use. Also tell them if you smoke, drink alcohol, or use illegal drugs. Some items may interact with your medicine. What should I watch for while using this medicine? Your condition will be monitored carefully while you are receiving this medicine. You will need important blood work done while you are taking this medicine. This medicine can cause serious allergic reactions. To reduce   your risk you will need to take other medicine(s) before treatment with this medicine.  If you experience allergic reactions like skin rash, itching or hives, swelling of the face, lips, or tongue, tell your doctor or health care professional right away. In some cases, you may be given additional medicines to help with side effects. Follow all directions for their use. This drug may make you feel generally unwell. This is not uncommon, as chemotherapy can affect healthy cells as well as cancer cells. Report any side effects. Continue your course of treatment even though you feel ill unless your doctor tells you to stop. Call your doctor or health care professional for advice if you get a fever, chills or sore throat, or other symptoms of a cold or flu. Do not treat yourself. This drug decreases your body's ability to fight infections. Try to avoid being around people who are sick. This medicine may increase your risk to bruise or bleed. Call your doctor or health care professional if you notice any unusual bleeding. Be careful brushing and flossing your teeth or using a toothpick because you may get an infection or bleed more easily. If you have any dental work done, tell your dentist you are receiving this medicine. Avoid taking products that contain aspirin, acetaminophen, ibuprofen, naproxen, or ketoprofen unless instructed by your doctor. These medicines may hide a fever. Do not become pregnant while taking this medicine. Women should inform their doctor if they wish to become pregnant or think they might be pregnant. There is a potential for serious side effects to an unborn child. Talk to your health care professional or pharmacist for more information. Do not breast-feed an infant while taking this medicine. Men are advised not to father a child while receiving this medicine. This product may contain alcohol. Ask your pharmacist or healthcare provider if this medicine contains alcohol. Be sure to tell all healthcare providers you are taking this medicine. Certain medicines, like  metronidazole and disulfiram, can cause an unpleasant reaction when taken with alcohol. The reaction includes flushing, headache, nausea, vomiting, sweating, and increased thirst. The reaction can last from 30 minutes to several hours. What side effects may I notice from receiving this medicine? Side effects that you should report to your doctor or health care professional as soon as possible: -allergic reactions like skin rash, itching or hives, swelling of the face, lips, or tongue -breathing problems -changes in vision -fast, irregular heartbeat -high or low blood pressure -mouth sores -pain, tingling, numbness in the hands or feet -signs of decreased platelets or bleeding - bruising, pinpoint red spots on the skin, black, tarry stools, blood in the urine -signs of decreased red blood cells - unusually weak or tired, feeling faint or lightheaded, falls -signs of infection - fever or chills, cough, sore throat, pain or difficulty passing urine -signs and symptoms of liver injury like dark yellow or brown urine; general ill feeling or flu-like symptoms; light-colored stools; loss of appetite; nausea; right upper belly pain; unusually weak or tired; yellowing of the eyes or skin -swelling of the ankles, feet, hands -unusually slow heartbeat Side effects that usually do not require medical attention (report to your doctor or health care professional if they continue or are bothersome): -diarrhea -hair loss -loss of appetite -muscle or joint pain -nausea, vomiting -pain, redness, or irritation at site where injected -tiredness This list may not describe all possible side effects. Call your doctor for medical advice about side effects. You may report side effects   to FDA at 1-800-FDA-1088. Where should I keep my medicine? This drug is given in a hospital or clinic and will not be stored at home. NOTE: This sheet is a summary. It may not cover all possible information. If you have questions  about this medicine, talk to your doctor, pharmacist, or health care provider.  2019 [employer] Standard (2016-10-14 13:14:55)  Carboplatin injection What is this medicine? CARBOPLATIN (KAR boe pla tin) is a chemotherapy drug. It targets fast dividing cells, like cancer cells, and causes these cells to die. This medicine is used to treat ovarian cancer and many other cancers. This medicine may be used for other purposes; ask your health care provider or pharmacist if you have questions. COMMON BRAND NAME(S): Paraplatin What should I tell my health care provider before I take this medicine? They need to know if you have any of these conditions: -blood disorders -hearing problems -kidney disease -recent or ongoing radiation therapy -an unusual or allergic reaction to carboplatin, cisplatin, other chemotherapy, other medicines, foods, dyes, or preservatives -pregnant or trying to get pregnant -breast-feeding How should I use this medicine? This drug is usually given as an infusion into a vein. It is administered in a hospital or clinic by a specially trained health care professional. Talk to your pediatrician regarding the use of this medicine in children. Special care may be needed. Overdosage: If you think you have taken too much of this medicine contact a poison control center or emergency room at once. NOTE: This medicine is only for you. Do not share this medicine with others. What if I miss a dose? It is important not to miss a dose. Call your doctor or health care professional if you are unable to keep an appointment. What may interact with this medicine? -medicines for seizures -medicines to increase blood counts like filgrastim, pegfilgrastim, sargramostim -some antibiotics like amikacin, gentamicin, neomycin, streptomycin, tobramycin -vaccines Talk to your doctor or health care professional before taking any of these  medicines: -acetaminophen -aspirin -ibuprofen -ketoprofen -naproxen This list may not describe all possible interactions. Give your health care provider a list of all the medicines, herbs, non-prescription drugs, or dietary supplements you use. Also tell them if you smoke, drink alcohol, or use illegal drugs. Some items may interact with your medicine. What should I watch for while using this medicine? Your condition will be monitored carefully while you are receiving this medicine. You will need important blood work done while you are taking this medicine. This drug may make you feel generally unwell. This is not uncommon, as chemotherapy can affect healthy cells as well as cancer cells. Report any side effects. Continue your course of treatment even though you feel ill unless your doctor tells you to stop. In some cases, you may be given additional medicines to help with side effects. Follow all directions for their use. Call your doctor or health care professional for advice if you get a fever, chills or sore throat, or other symptoms of a cold or flu. Do not treat yourself. This drug decreases your body's ability to fight infections. Try to avoid being around people who are sick. This medicine may increase your risk to bruise or bleed. Call your doctor or health care professional if you notice any unusual bleeding. Be careful brushing and flossing your teeth or using a toothpick because you may get an infection or bleed more easily. If you have any dental work done, tell your dentist you are receiving this medicine. Avoid taking products   that contain aspirin, acetaminophen, ibuprofen, naproxen, or ketoprofen unless instructed by your doctor. These medicines may hide a fever. Do not become pregnant while taking this medicine. Women should inform their doctor if they wish to become pregnant or think they might be pregnant. There is a potential for serious side effects to an unborn child. Talk to  your health care professional or pharmacist for more information. Do not breast-feed an infant while taking this medicine. What side effects may I notice from receiving this medicine? Side effects that you should report to your doctor or health care professional as soon as possible: -allergic reactions like skin rash, itching or hives, swelling of the face, lips, or tongue -signs of infection - fever or chills, cough, sore throat, pain or difficulty passing urine -signs of decreased platelets or bleeding - bruising, pinpoint red spots on the skin, black, tarry stools, nosebleeds -signs of decreased red blood cells - unusually weak or tired, fainting spells, lightheadedness -breathing problems -changes in hearing -changes in vision -chest pain -high blood pressure -low blood counts - This drug may decrease the number of white blood cells, red blood cells and platelets. You may be at increased risk for infections and bleeding. -nausea and vomiting -pain, swelling, redness or irritation at the injection site -pain, tingling, numbness in the hands or feet -problems with balance, talking, walking -trouble passing urine or change in the amount of urine Side effects that usually do not require medical attention (report to your doctor or health care professional if they continue or are bothersome): -hair loss -loss of appetite -metallic taste in the mouth or changes in taste This list may not describe all possible side effects. Call your doctor for medical advice about side effects. You may report side effects to FDA at 1-800-FDA-1088. Where should I keep my medicine? This drug is given in a hospital or clinic and will not be stored at home. NOTE: This sheet is a summary. It may not cover all possible information. If you have questions about this medicine, talk to your doctor, pharmacist, or health care provider.  2019 [employer]/Gold Standard (2007-05-18 14:38:05)    

## 2018-05-13 ENCOUNTER — Telehealth: Payer: Self-pay | Admitting: *Deleted

## 2018-05-13 NOTE — Telephone Encounter (Signed)
[facility], [phone] number(s).  Message left requesting a return call for chemotherapy follow up.  Awaiting return call from patient.

## 2018-05-13 NOTE — Telephone Encounter (Signed)
-----   Message from Myrtie Hawk, RN sent at 05/11/2018  2:55 PM EDT ----- Regarding: Dr. Jana Hakim pt first time chemo f/u call Pt of Dr. Jana Hakim first time taxol/ [place] follow up call.  Pt tolerated treatment well with no issues.

## 2018-05-17 ENCOUNTER — Other Ambulatory Visit: Payer: Self-pay | Admitting: *Deleted

## 2018-05-17 DIAGNOSIS — C50412 Malignant neoplasm of upper-outer quadrant of left female breast: Secondary | ICD-10-CM

## 2018-05-17 DIAGNOSIS — Z170 Estrogen receptor positive status [ER+]: Principal | ICD-10-CM

## 2018-05-17 NOTE — Progress Notes (Signed)
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 She underwent bilateral diagnostic mammography with tomography and left breast ultrasonography at [facility] on 02/16/2018 showing: breast density category C; two left breast masses, one at 2 o'clock and the other at 3:30 o'clock. The 2 o'clock mass (1.3 x 1 x 1 cm) corresponds to the mammographic abnormality and is highly suspicious for breast carcinoma. The other mass (1.2 x 0.7 x 1.2 cm)  is suspicious for breast carcinoma. No left axillary adenopathy.   Accordingly on 02/19/2018 she proceeded to biopsy of the left breast area in question. 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 Prognostic indicators significant for: estrogen receptor, 90% positive and progesterone receptor, 95% positive, both with strong staining intensity. Proliferation marker Ki67 at 5%. HER2 negative (1+).   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 Prognostic indicators significant for: estrogen receptor, 10% positive with moderate staining intensity and progesterone receptor, 0%  negative. Proliferation marker Ki67 at 40%. HER2 negative (1+).   The patient's subsequent history is as detailed below.   INTERVAL HISTORY: Emily Chambers returns today for follow-up and treatment of her estrogen positive and estrogen negative breast cancer.   She continues on paclitaxel and carboplatin weekly x12. Today is day 1 cycle 2. She didn't feel as nauseated. She developed an itching rash between her thighs, which she treated with some cream; the rash is currently drying out. Her appetite is good, but her taste is "terrible." She takes a stool softener to manage constipation. She has had no issues with neuropathy.   Since her last visit here, she has not undergone any additional studies.     REVIEW OF SYSTEMS: Emily Chambers is interested in extending her disability through more of her treatment. She continues to clean and do chores around her house; she has been hesitant to go out due to COVID-19. The patient denies unusual headaches, visual changes, nausea, vomiting, or dizziness. There has been no unusual cough, phlegm production, or pleurisy. This been no change in bowel or bladder habits. The patient denies unexplained fatigue or unexplained weight loss, bleeding, rash, or fever. A detailed review of systems was otherwise noncontributory.    PAST MEDICAL HISTORY: Past Medical History:  Diagnosis Date  . Family history of breast cancer   . Family history of lung cancer   . Family history of throat cancer   . High cholesterol   . Hypertension   . Kidney stone   . Kidney stones   . UTI (lower urinary tract infection)     PAST SURGICAL HISTORY: Past Surgical History:  Procedure Laterality Date  . BREAST EXCISIONAL BIOPSY Left 1999,2005   cysts removed  . BREAST SURGERY Left    cyst and biopsy  . PORTACATH PLACEMENT Left  03/10/2018   Procedure: INSERTION PORT-A-CATH;  Surgeon: Stark Klein, MD;  Location: [facility];  Service: General;  Laterality: Left;    FAMILY  HISTORY: Family History  Problem Relation Age of Onset  . Hypertension Mother   . Heart disease Mother   . Dementia Mother   . Healthy Father   . Colon cancer Maternal Grandmother   . Cancer Paternal Grandmother        unk type  . Lung cancer Cousin        91s  . Breast cancer Cousin        45s  . Breast cancer Cousin        63s  . Breast cancer Cousin   . Cancer Paternal Aunt        unk type d. 32, possibly pancreatic  . Cancer Paternal Aunt        unk type d. 68s  . Cancer Paternal Uncle        unk type   . Cancer Paternal Uncle        unk type d. 36s  . Cancer Maternal Aunt        unk type, d. 53s  . Throat cancer Maternal Uncle        d. 15s  . Ovarian cancer Neg Hx    Patient father is alive at 81 years old. Patient mother died from heart disease and dementia at age 56.  The patient denies a family hx of ovarian cancer. She has 3 siblings, 1 brother and 2 sisters. She has a maternal cousin diagnosed with lung cancer in her 4s. She has 3 paternal cousins with breast cancer, one was diagnosed in her 15s and has passed away.   GYNECOLOGIC HISTORY:  No LMP recorded. Patient is postmenopausal. Menarche: 68 years old Age at first live birth: n/a GX P 0 LMP 2010 Contraceptive n/a HRT no  Hysterectomy? no BSo? no   SOCIAL HISTORY: She is single and works as a Scientist, water quality at [employer]). She lives alone, with no pets.    ADVANCED DIRECTIVES: Not in place.  At the 03/03/2018 visit she was given the appropriate documents to complete and notarize at her discretion.  She is planning to name her niece, [name], as her HCPOA.   HEALTH MAINTENANCE: Social History   Tobacco Use  . Smoking status: Never Smoker  . Smokeless tobacco: Never Used  Substance Use Topics  . Alcohol use: Yes    Comment: seldom  . Drug use: No     Colonoscopy: 2014? [facility]  PAP: 02/02/2018  Bone density: 03/15/2016, T-score 0.4, Dr. Alden Hipp    No Known Allergies  Current Outpatient Medications  Medication Sig Dispense Refill  . amLODipine (NORVASC) 10 MG tablet Take 10 mg by mouth every morning.     Marland Kitchen aspirin 81 MG chewable tablet Chew 81 mg by mouth daily.    . ciprofloxacin (CIPRO) 500 MG tablet Take 1 tablet (500 mg total) by mouth 2 (two) times daily. 10 tablet 0  . dexamethasone (DECADRON) 4 MG tablet Take 2 tablets by mouth twice a day for three days starting the day after chemotherapy. Take with food. 30 tablet 1  . lidocaine-prilocaine (EMLA) cream Apply 1 application topically as needed. 30 g 0  . LORazepam (ATIVAN) 0.5 MG tablet Take 1 tablet (0.5 mg total) by mouth every 6 (six) hours as needed (Nausea or vomiting). 30 tablet 0  . losartan (COZAAR) 100  MG tablet Take 100 mg by mouth every morning.     Marland Kitchen omeprazole (PRILOSEC) 40 MG capsule Take 1 capsule (40 mg total) by mouth daily. 30 capsule 5  . oxyCODONE (OXY IR/ROXICODONE) 5 MG immediate release tablet Take 1 tablet (5 mg total) by mouth every 6 (six) hours as needed for severe pain. 10 tablet 0  . potassium chloride SA (K-DUR,KLOR-CON) 20 MEQ tablet Take 1 tablet (20 mEq total) by mouth daily. 5 tablet 0  . pravastatin (PRAVACHOL) 20 MG tablet Take 20 mg by mouth every morning.     . prochlorperazine (COMPAZINE) 10 MG tablet Take 1 tablet (10 mg total) by mouth every 6 (six) hours as needed (Nausea or vomiting). 30 tablet 1   No current facility-administered medications for this visit.     OBJECTIVE: Middle-aged African-American woman who appears well  Vitals:   05/18/18 1138  BP: 118/70  Pulse: 91  Resp: 18  Temp: 98.4 F (36.9 C)  SpO2: 99%     Body mass index is 32.96 kg/m.   Wt Readings from Last 3 Encounters:  05/18/18 180 lb 3.2 oz (81.7 kg)  05/11/18 176 lb 1.6 oz (79.9 kg)  05/04/18 179 lb 4.8 oz (81.3 kg)  ECOG FS:1  Sclerae unicteric, pupils round and equal No cervical or supraclavicular adenopathy Lungs no rales or rhonchi Heart regular  rate and rhythm Abd soft, nontender, positive bowel sounds MSK no focal spinal tenderness, no upper extremity lymphedema Neuro: nonfocal, well oriented, appropriate affect Breasts: Deferred   LAB RESULTS:  CMP     Component Value Date/Time   NA 143 05/11/2018 0947   K 3.3 (L) 05/11/2018 0947   CL 106 05/11/2018 0947   CO2 23 05/11/2018 0947   GLUCOSE 189 (H) 05/11/2018 0947   BUN 10 05/11/2018 0947   CREATININE 0.80 05/11/2018 0947   CALCIUM 9.2 05/11/2018 0947   PROT 6.9 05/11/2018 0947   ALBUMIN 3.7 05/11/2018 0947   AST 12 (L) 05/11/2018 0947   ALT 21 05/11/2018 0947   ALKPHOS 141 (H) 05/11/2018 0947   BILITOT 0.3 05/11/2018 0947   GFRNONAA >60 05/11/2018 0947   GFRAA >60 05/11/2018 0947    No results found for: TOTALPROTELP, ALBUMINELP, A1GS, A2GS, BETS, BETA2SER, GAMS, MSPIKE, SPEI  No results found for: KPAFRELGTCHN, LAMBDASER, KAPLAMBRATIO  Lab Results  Component Value Date   WBC 7.2 05/18/2018   NEUTROABS 5.1 05/18/2018   HGB 8.6 (L) 05/18/2018   HCT 28.3 (L) 05/18/2018   MCV 93.1 05/18/2018   PLT 305 05/18/2018    '@LASTCHEMISTRY'$ @  No results found for: LABCA2  No components found for: UYQIHK742  No results for input(s): INR in the last 168 hours.  No results found for: LABCA2  No results found for: VZD638  No results found for: VFI433  No results found for: IRJ188  No results found for: CA2729  No components found for: HGQUANT  No results found for: CEA1 / No results found for: CEA1   No results found for: AFPTUMOR  No results found for: CHROMOGRNA  No results found for: PSA1  Appointment on 05/18/2018  Component Date Value Ref Range Status  . WBC Count 05/18/2018 7.2  4.0 - 10.5 K/uL Final  . RBC 05/18/2018 3.04* 3.87 - 5.11 MIL/uL Final  . Hemoglobin 05/18/2018 8.6* 12.0 - 15.0 g/dL Final  . HCT 05/18/2018 28.3* 36.0 - 46.0 % Final  . MCV 05/18/2018 93.1  80.0 - 100.0 fL Final  . MCH 05/18/2018 28.3  26.0 - 34.0 pg Final  .  MCHC 05/18/2018 30.4  30.0 - 36.0 g/dL Final  . RDW 05/18/2018 16.6* 11.5 - 15.5 % Final  . Platelet Count 05/18/2018 305  150 - 400 K/uL Final  . nRBC 05/18/2018 0.3* 0.0 - 0.2 % Final  . Neutrophils Relative % 05/18/2018 71  % Final  . Neutro Abs 05/18/2018 5.1  1.7 - 7.7 K/uL Final  . Lymphocytes Relative 05/18/2018 15  % Final  . Lymphs Abs 05/18/2018 1.1  0.7 - 4.0 K/uL Final  . Monocytes Relative 05/18/2018 9  % Final  . Monocytes Absolute 05/18/2018 0.7  0.1 - 1.0 K/uL Final  . Eosinophils Relative 05/18/2018 1  % Final  . Eosinophils Absolute 05/18/2018 0.1  0.0 - 0.5 K/uL Final  . Basophils Relative 05/18/2018 2  % Final  . Basophils Absolute 05/18/2018 0.1  0.0 - 0.1 K/uL Final  . Immature Granulocytes 05/18/2018 2  % Final  . Abs Immature Granulocytes 05/18/2018 0.11* 0.00 - 0.07 K/uL Final   Performed at [facility], [address] [place], [place] [address]    (this displays the last labs from the last 3 days)  No results found for: TOTALPROTELP, ALBUMINELP, A1GS, A2GS, BETS, BETA2SER, GAMS, MSPIKE, SPEI (this displays SPEP labs)  No results found for: KPAFRELGTCHN, LAMBDASER, KAPLAMBRATIO (kappa/lambda light chains)  No results found for: HGBA, HGBA2QUANT, HGBFQUANT, HGBSQUAN (Hemoglobinopathy evaluation)   No results found for: LDH  No results found for: IRON, TIBC, IRONPCTSAT (Iron and TIBC)  No results found for: FERRITIN  Urinalysis    Component Value Date/Time   COLORURINE YELLOW 04/12/2018 1504   APPEARANCEUR CLEAR 04/12/2018 1504   LABSPEC 1.020 04/12/2018 1504   PHURINE 5.0 04/12/2018 1504   GLUCOSEU NEGATIVE 04/12/2018 1504   HGBUR NEGATIVE 04/12/2018 1504   BILIRUBINUR NEGATIVE 04/12/2018 1504   KETONESUR NEGATIVE 04/12/2018 1504   PROTEINUR NEGATIVE 04/12/2018 1504   UROBILINOGEN 0.2 07/07/2012 1159   NITRITE NEGATIVE 04/12/2018 1504   LEUKOCYTESUR TRACE (A) 04/12/2018 1504     STUDIES: No results found.    ELIGIBLE FOR AVAILABLE RESEARCH PROTOCOL: Upbeat   ASSESSMENT: 68 y.o. 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, grade 2, estrogen and progesterone receptor positive, HER-2 not amplified, with an MIB-1 of 5%  (b) in the lower outer quadrant a clinical T1c N0, stage IA-B invasive ductal carcinoma, grade 2, estrogen receptor only moderately positive at 10%, progesterone receptor negative, with an MIB-1 of 40%, and HER-2 not amplified  (1) neoadjuvant chemotherapy will consist of doxorubicin and cyclophosphamide in dose dense fashion x4 starting 03/16/2018, followed by paclitaxel and carboplatin weekly x12  (a) echo on 03/11/2018 shows well preserved EF of 60-65%  (2) definitive surgery to follow  (3) adjuvant radiation as appropriate  (4) antiestrogens to follow at the completion of local treatment (for upper outer quadrant tumor)  (5) genetics testing on 03/24/2018 showed no pathogenic mutations.  Genes tested include:  APC, ATM, AXIN2, BARD1, BMPR1A, BRCA1, BRCA2, BRIP1, CDH1, CDKN2A (p14ARF), CDKN2A (p16INK4a), CKD4, CHEK2, CTNNA1, DICER1, EPCAM (Deletion/duplication testing only), GREM1 (promoter region deletion/duplication testing only), KIT, MEN1, MLH1, MSH2, MSH3, MSH6, MUTYH, NBN, NF1, NHTL1, PALB2, PDGFRA, PMS2, POLD1, POLE, PTEN, RAD50, RAD51C, RAD51D, SDHB, SDHC, SDHD, SMAD4, SMARCA4. STK11, TP53, TSC1, TSC2, and VHL.  The following genes were evaluated for sequence changes only: SDHA and HOXB13  c.251G>A variant only.   PLAN: Emily Chambers is tolerating the carboplatin/paclitaxel weekly treatments well so far.  Unfortunately the taste perversion is not likely to clear until she has completed all her chemotherapy.  The rash that she experienced is going to be due to the steroids and I have cut the dose to the minimum I think would be effective.  We are of course  trying to prevent reactions to either the carboplatin or the paclitaxel.  I am concerned that her immune system is not normal and if she is working at [employer] it is going to be very hard to isolate her, if not impossible.  I think it is preferable if her leave can be extended until she is done with the chemotherapy.  Hopefully by then the virus will have blown through.  She knows to call for any other issues that may develop before the next visit here.  Venkat Ankney, Virgie Dad, MD  05/18/18 11:44 AM Medical Oncology and Hematology [facility] [address] [place], [place] [address] Tel. [phone]    Fax. [phone]  I, Jacqualyn Posey am acting as a Education administrator for Chauncey Cruel, MD.   I, Lurline Del MD, have reviewed the above documentation for accuracy and completeness, and I agree with the above.

## 2018-05-18 ENCOUNTER — Other Ambulatory Visit: Payer: Self-pay

## 2018-05-18 ENCOUNTER — Inpatient Hospital Stay: Payer: Medicare Other

## 2018-05-18 ENCOUNTER — Inpatient Hospital Stay: Payer: Medicare Other | Admitting: Oncology

## 2018-05-18 ENCOUNTER — Telehealth: Payer: Self-pay | Admitting: General Practice

## 2018-05-18 VITALS — BP 118/70 | HR 91 | Temp 98.4°F | Resp 18 | Ht 62.0 in | Wt 180.2 lb

## 2018-05-18 DIAGNOSIS — Z79899 Other long term (current) drug therapy: Secondary | ICD-10-CM

## 2018-05-18 DIAGNOSIS — C50512 Malignant neoplasm of lower-outer quadrant of left female breast: Secondary | ICD-10-CM

## 2018-05-18 DIAGNOSIS — C50412 Malignant neoplasm of upper-outer quadrant of left female breast: Secondary | ICD-10-CM

## 2018-05-18 DIAGNOSIS — Z170 Estrogen receptor positive status [ER+]: Principal | ICD-10-CM

## 2018-05-18 DIAGNOSIS — Z95828 Presence of other vascular implants and grafts: Secondary | ICD-10-CM

## 2018-05-18 DIAGNOSIS — Z171 Estrogen receptor negative status [ER-]: Secondary | ICD-10-CM

## 2018-05-18 DIAGNOSIS — Z7982 Long term (current) use of aspirin: Secondary | ICD-10-CM

## 2018-05-18 DIAGNOSIS — N184 Chronic kidney disease, stage 4 (severe): Secondary | ICD-10-CM

## 2018-05-18 DIAGNOSIS — Z5111 Encounter for antineoplastic chemotherapy: Secondary | ICD-10-CM | POA: Diagnosis not present

## 2018-05-18 LAB — CMP (CANCER CENTER ONLY)
ALBUMIN: 3.5 g/dL (ref 3.5–5.0)
ALT: 23 U/L (ref 0–44)
AST: 14 U/L — ABNORMAL LOW (ref 15–41)
Alkaline Phosphatase: 105 U/L (ref 38–126)
Anion gap: 12 (ref 5–15)
BILIRUBIN TOTAL: 0.2 mg/dL — AB (ref 0.3–1.2)
BUN: 11 mg/dL (ref 8–23)
CO2: 25 mmol/L (ref 22–32)
Calcium: 9.4 mg/dL (ref 8.9–10.3)
Chloride: 107 mmol/L (ref 98–111)
Creatinine: 0.7 mg/dL (ref 0.44–1.00)
GFR, Est AFR Am: >60 mL/min (ref >60)
GFR, Estimated: >60 mL/min (ref >60)
GLUCOSE: 156 mg/dL — AB (ref 70–99)
Potassium: 3.7 mmol/L (ref 3.5–5.1)
Sodium: 144 mmol/L (ref 135–145)
Total Protein: 6.6 g/dL (ref 6.5–8.1)

## 2018-05-18 LAB — CBC WITH DIFFERENTIAL (CANCER CENTER ONLY)
Abs Immature Granulocytes: 0.11 10*3/uL — ABNORMAL HIGH (ref 0.00–0.07)
Basophils Absolute: 0.1 10*3/uL (ref 0.0–0.1)
Basophils Relative: 2 %
Eosinophils Absolute: 0.1 10*3/uL (ref 0.0–0.5)
Eosinophils Relative: 1 %
HEMATOCRIT: 28.3 % — AB (ref 36.0–46.0)
Hemoglobin: 8.6 g/dL — ABNORMAL LOW (ref 12.0–15.0)
Immature Granulocytes: 2 %
Lymphocytes Relative: 15 %
Lymphs Abs: 1.1 10*3/uL (ref 0.7–4.0)
MCH: 28.3 pg (ref 26.0–34.0)
MCHC: 30.4 g/dL (ref 30.0–36.0)
MCV: 93.1 fL (ref 80.0–100.0)
Monocytes Absolute: 0.7 10*3/uL (ref 0.1–1.0)
Monocytes Relative: 9 %
NRBC: 0.3 % — AB (ref 0.0–0.2)
Neutro Abs: 5.1 10*3/uL (ref 1.7–7.7)
Neutrophils Relative %: 71 %
Platelet Count: 305 10*3/uL (ref 150–400)
RBC: 3.04 MIL/uL — ABNORMAL LOW (ref 3.87–5.11)
RDW: 16.6 % — ABNORMAL HIGH (ref 11.5–15.5)
WBC Count: 7.2 10*3/uL (ref 4.0–10.5)

## 2018-05-18 MED ORDER — PALONOSETRON HCL INJECTION 0.25 MG/5ML
0.2500 mg | Freq: Once | INTRAVENOUS | Status: AC
  Administered 2018-05-18: 0.25 mg via INTRAVENOUS

## 2018-05-18 MED ORDER — SODIUM CHLORIDE 0.9 % IV SOLN
187.2000 mg | Freq: Once | INTRAVENOUS | Status: AC
  Administered 2018-05-18: 190 mg via INTRAVENOUS
  Filled 2018-05-18: qty 19

## 2018-05-18 MED ORDER — SODIUM CHLORIDE 0.9 % IV SOLN
20.0000 mg | Freq: Once | INTRAVENOUS | Status: AC
  Administered 2018-05-18: 20 mg via INTRAVENOUS
  Filled 2018-05-18: qty 2

## 2018-05-18 MED ORDER — DIPHENHYDRAMINE HCL 50 MG/ML IJ SOLN
25.0000 mg | Freq: Once | INTRAMUSCULAR | Status: AC
  Administered 2018-05-18: 25 mg via INTRAVENOUS

## 2018-05-18 MED ORDER — DEXAMETHASONE SODIUM PHOSPHATE 10 MG/ML IJ SOLN
4.0000 mg | Freq: Once | INTRAMUSCULAR | Status: AC
  Administered 2018-05-18: 4 mg via INTRAVENOUS

## 2018-05-18 MED ORDER — SODIUM CHLORIDE 0.9% FLUSH
10.0000 mL | INTRAVENOUS | Status: DC | PRN
  Administered 2018-05-18: 10 mL
  Filled 2018-05-18: qty 10

## 2018-05-18 MED ORDER — PALONOSETRON HCL INJECTION 0.25 MG/5ML
INTRAVENOUS | Status: AC
  Filled 2018-05-18: qty 5

## 2018-05-18 MED ORDER — SODIUM CHLORIDE 0.9 % IV SOLN
80.0000 mg/m2 | Freq: Once | INTRAVENOUS | Status: AC
  Administered 2018-05-18: 150 mg via INTRAVENOUS
  Filled 2018-05-18: qty 25

## 2018-05-18 MED ORDER — DEXAMETHASONE SODIUM PHOSPHATE 10 MG/ML IJ SOLN
INTRAMUSCULAR | Status: AC
  Filled 2018-05-18: qty 1

## 2018-05-18 MED ORDER — HEPARIN SOD (PORK) LOCK FLUSH 100 UNIT/ML IV SOLN
500.0000 [IU] | Freq: Once | INTRAVENOUS | Status: AC | PRN
  Administered 2018-05-18: 500 [IU]
  Filled 2018-05-18: qty 5

## 2018-05-18 MED ORDER — DIPHENHYDRAMINE HCL 50 MG/ML IJ SOLN
INTRAMUSCULAR | Status: AC
  Filled 2018-05-18: qty 1

## 2018-05-18 MED ORDER — SODIUM CHLORIDE 0.9% FLUSH
10.0000 mL | Freq: Once | INTRAVENOUS | Status: AC
  Administered 2018-05-18: 10 mL
  Filled 2018-05-18: qty 10

## 2018-05-18 MED ORDER — SODIUM CHLORIDE 0.9 % IV SOLN: 4.0000 mg | Freq: Once | INTRAVENOUS | Status: DC

## 2018-05-18 MED ORDER — SODIUM CHLORIDE 0.9 % IV SOLN
Freq: Once | INTRAVENOUS | Status: AC
  Administered 2018-05-18: 12:00:00 via INTRAVENOUS
  Filled 2018-05-18: qty 250

## 2018-05-18 NOTE — Patient Instructions (Signed)
Coronavirus (COVID-19) Are you at risk?  Are you at risk for the Coronavirus (COVID-19)?  To be considered HIGH RISK for Coronavirus (COVID-19), you have to meet the following criteria:  . Traveled to [place], [place], [place], [place] or [place]; or in the [place] to [place], [place], [place], or [place]; and have fever, cough, and shortness of breath within the last 2 weeks of travel OR . Been in close contact with a person diagnosed with COVID-19 within the last 2 weeks and have fever, cough, and shortness of breath . IF YOU DO NOT MEET THESE CRITERIA, YOU ARE CONSIDERED LOW RISK FOR COVID-19.  What to do if you are HIGH RISK for COVID-19?  . If you are having a medical emergency, call 911. . Seek medical care right away. Before you go to a doctor's office, urgent care or emergency department, call ahead and tell them about your recent travel, contact with someone diagnosed with COVID-19, and your symptoms. You should receive instructions from your physician's office regarding next steps of care.  . When you arrive at healthcare provider, tell the healthcare staff immediately you have returned from visiting [place], [place], [place], [place] or [place]; or traveled in the [place] to [place], [place], [place], or [place]; in the last two weeks or you have been in close contact with a person diagnosed with COVID-19 in the last 2 weeks.   . Tell the health care staff about your symptoms: fever, cough and shortness of breath. . After you have been seen by a medical provider, you will be either: o Tested for (COVID-19) and discharged home on quarantine except to seek medical care if symptoms worsen, and asked to  - Stay home and avoid contact with others until you get your results (4-5 days)  - Avoid travel on public transportation if possible (such as bus, train, or airplane) or o Sent to the Emergency Department by EMS for evaluation, COVID-19 testing, and possible  admission depending on your condition and test results.  What to do if you are LOW RISK for COVID-19?  Reduce your risk of any infection by using the same precautions used for avoiding the common cold or flu:  . Wash your hands often with soap and warm water for at least 20 seconds.  If soap and water are not readily available, use an alcohol-based hand sanitizer with at least 60% alcohol.  . If coughing or sneezing, cover your mouth and nose by coughing or sneezing into the elbow areas of your shirt or coat, into a tissue or into your sleeve (not your hands). . Avoid shaking hands with others and consider head nods or verbal greetings only. . Avoid touching your eyes, nose, or mouth with unwashed hands.  . Avoid close contact with people who are sick. . Avoid places or events with large numbers of people in one location, like concerts or sporting events. . Carefully consider travel plans you have or are making. . If you are planning any travel outside or inside the [place], visit the CDC's Travelers' Health webpage for the latest health notices. . If you have some symptoms but not all symptoms, continue to monitor at home and seek medical attention if your symptoms worsen. . If you are having a medical emergency, call 911.   ADDITIONAL HEALTHCARE OPTIONS FOR PATIENTS  [facility] / e-Visit: [email or web address]         [facility]: [phone]     Urgent Care: [phone]                   [facility]: [phone]   [facility] Discharge Instructions for Patients Receiving Chemotherapy  Today you received the following chemotherapy agents Taxol and Carboplatin   To help prevent nausea and vomiting after your treatment, we encourage you to take your nausea medication as directed.    If you develop nausea and vomiting that is not controlled by your nausea medication, call the clinic.    BELOW ARE SYMPTOMS THAT SHOULD BE REPORTED IMMEDIATELY:  *FEVER GREATER THAN 100.5 F  *CHILLS WITH OR WITHOUT FEVER  NAUSEA AND VOMITING THAT IS NOT CONTROLLED WITH YOUR NAUSEA MEDICATION  *UNUSUAL SHORTNESS OF BREATH  *UNUSUAL BRUISING OR BLEEDING  TENDERNESS IN MOUTH AND THROAT WITH OR WITHOUT PRESENCE OF ULCERS  *URINARY PROBLEMS  *BOWEL PROBLEMS  UNUSUAL RASH Items with * indicate a potential emergency and should be followed up as soon as possible.  Feel free to call the clinic should you have any questions or concerns. The clinic phone number is [phone].  Please show the CHEMO ALERT CARD at check-in to the Emergency Department and triage nurse.   

## 2018-05-18 NOTE — Telephone Encounter (Signed)
[facility] CSW Progress Notes  Request received from Financial Advocate to contact patient re additional options for financial support.  Has received maximum [employer] grant amount at this time.  Left patient VM w my contact information and asked her to let me know when she is available for a phone consultation re financial resourced available in the community.  Edwyna Shell, LCSW Clinical Social Worker Phone:  [phone]

## 2018-05-19 ENCOUNTER — Telehealth: Payer: Self-pay | Admitting: General Practice

## 2018-05-19 NOTE — Telephone Encounter (Signed)
[facility] CSW Progress Notes  Call to patient who was referred by Financial Advocate for any additional financial help.  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  Had Food Stamps for Feb and March, unsure whether they will be renewed.  Reviewed options including [facility] [place] and [employer] - pt to review these options online, call CSW and schedule phone consultation to complete applications as appropriate.  Edwyna Shell, LCSW Clinical Social Worker Phone:  [phone]

## 2018-05-24 ENCOUNTER — Other Ambulatory Visit: Payer: Self-pay | Admitting: Adult Health

## 2018-05-24 DIAGNOSIS — Z170 Estrogen receptor positive status [ER+]: Secondary | ICD-10-CM

## 2018-05-24 DIAGNOSIS — C50412 Malignant neoplasm of upper-outer quadrant of left female breast: Secondary | ICD-10-CM

## 2018-05-24 DIAGNOSIS — C50512 Malignant neoplasm of lower-outer quadrant of left female breast: Secondary | ICD-10-CM

## 2018-05-24 DIAGNOSIS — Z171 Estrogen receptor negative status [ER-]: Principal | ICD-10-CM

## 2018-05-25 ENCOUNTER — Inpatient Hospital Stay: Payer: Medicare Other

## 2018-05-25 ENCOUNTER — Other Ambulatory Visit: Payer: Self-pay

## 2018-05-25 VITALS — BP 117/78 | HR 78 | Temp 97.9°F | Resp 18

## 2018-05-25 DIAGNOSIS — C50512 Malignant neoplasm of lower-outer quadrant of left female breast: Secondary | ICD-10-CM

## 2018-05-25 DIAGNOSIS — C50412 Malignant neoplasm of upper-outer quadrant of left female breast: Secondary | ICD-10-CM

## 2018-05-25 DIAGNOSIS — Z170 Estrogen receptor positive status [ER+]: Secondary | ICD-10-CM

## 2018-05-25 DIAGNOSIS — Z171 Estrogen receptor negative status [ER-]: Principal | ICD-10-CM

## 2018-05-25 DIAGNOSIS — Z95828 Presence of other vascular implants and grafts: Secondary | ICD-10-CM

## 2018-05-25 DIAGNOSIS — Z5111 Encounter for antineoplastic chemotherapy: Secondary | ICD-10-CM | POA: Diagnosis not present

## 2018-05-25 LAB — CBC WITH DIFFERENTIAL (CANCER CENTER ONLY)
Abs Immature Granulocytes: 0.03 10*3/uL (ref 0.00–0.07)
BASOS ABS: 0.1 10*3/uL (ref 0.0–0.1)
Basophils Relative: 2 %
Eosinophils Absolute: 0.1 10*3/uL (ref 0.0–0.5)
Eosinophils Relative: 2 %
HCT: 28.9 % — ABNORMAL LOW (ref 36.0–46.0)
Hemoglobin: 8.7 g/dL — ABNORMAL LOW (ref 12.0–15.0)
Immature Granulocytes: 1 %
LYMPHS PCT: 21 %
Lymphs Abs: 1.1 10*3/uL (ref 0.7–4.0)
MCH: 28.4 pg (ref 26.0–34.0)
MCHC: 30.1 g/dL (ref 30.0–36.0)
MCV: 94.4 fL (ref 80.0–100.0)
Monocytes Absolute: 0.5 10*3/uL (ref 0.1–1.0)
Monocytes Relative: 10 %
Neutro Abs: 3.3 10*3/uL (ref 1.7–7.7)
Neutrophils Relative %: 64 %
Platelet Count: 297 10*3/uL (ref 150–400)
RBC: 3.06 MIL/uL — ABNORMAL LOW (ref 3.87–5.11)
RDW: 17.9 % — ABNORMAL HIGH (ref 11.5–15.5)
WBC Count: 5 10*3/uL (ref 4.0–10.5)
nRBC: 0 % (ref 0.0–0.2)

## 2018-05-25 LAB — CMP (CANCER CENTER ONLY)
ALT: 25 U/L (ref 0–44)
AST: 14 U/L — ABNORMAL LOW (ref 15–41)
Albumin: 3.6 g/dL (ref 3.5–5.0)
Alkaline Phosphatase: 89 U/L (ref 38–126)
Anion gap: 10 (ref 5–15)
BUN: 16 mg/dL (ref 8–23)
CHLORIDE: 108 mmol/L (ref 98–111)
CO2: 25 mmol/L (ref 22–32)
Calcium: 9.5 mg/dL (ref 8.9–10.3)
Creatinine: 0.76 mg/dL (ref 0.44–1.00)
GFR, Est AFR Am: >60 mL/min (ref >60)
GFR, Estimated: >60 mL/min (ref >60)
Glucose, Bld: 112 mg/dL — ABNORMAL HIGH (ref 70–99)
Potassium: 3.9 mmol/L (ref 3.5–5.1)
Sodium: 143 mmol/L (ref 135–145)
Total Bilirubin: 0.3 mg/dL (ref 0.3–1.2)
Total Protein: 6.6 g/dL (ref 6.5–8.1)

## 2018-05-25 MED ORDER — SODIUM CHLORIDE 0.9 % IV SOLN
80.0000 mg/m2 | Freq: Once | INTRAVENOUS | Status: AC
  Administered 2018-05-25: 150 mg via INTRAVENOUS
  Filled 2018-05-25: qty 25

## 2018-05-25 MED ORDER — SODIUM CHLORIDE 0.9 % IV SOLN
187.2000 mg | Freq: Once | INTRAVENOUS | Status: AC
  Administered 2018-05-25: 190 mg via INTRAVENOUS
  Filled 2018-05-25: qty 19

## 2018-05-25 MED ORDER — SODIUM CHLORIDE 0.9% FLUSH
10.0000 mL | Freq: Once | INTRAVENOUS | Status: AC
  Administered 2018-05-25: 10 mL
  Filled 2018-05-25: qty 10

## 2018-05-25 MED ORDER — PALONOSETRON HCL INJECTION 0.25 MG/5ML
0.2500 mg | Freq: Once | INTRAVENOUS | Status: AC
  Administered 2018-05-25: 0.25 mg via INTRAVENOUS

## 2018-05-25 MED ORDER — DEXAMETHASONE SODIUM PHOSPHATE 10 MG/ML IJ SOLN
4.0000 mg | Freq: Once | INTRAMUSCULAR | Status: AC
  Administered 2018-05-25: 4 mg via INTRAVENOUS

## 2018-05-25 MED ORDER — SODIUM CHLORIDE 0.9 % IV SOLN: 4.0000 mg | Freq: Once | INTRAVENOUS | Status: DC

## 2018-05-25 MED ORDER — PALONOSETRON HCL INJECTION 0.25 MG/5ML
INTRAVENOUS | Status: AC
  Filled 2018-05-25: qty 5

## 2018-05-25 MED ORDER — DEXAMETHASONE SODIUM PHOSPHATE 10 MG/ML IJ SOLN
INTRAMUSCULAR | Status: AC
  Filled 2018-05-25: qty 1

## 2018-05-25 MED ORDER — SODIUM CHLORIDE 0.9 % IV SOLN
20.0000 mg | Freq: Once | INTRAVENOUS | Status: AC
  Administered 2018-05-25: 20 mg via INTRAVENOUS
  Filled 2018-05-25: qty 2

## 2018-05-25 MED ORDER — DIPHENHYDRAMINE HCL 50 MG/ML IJ SOLN
INTRAMUSCULAR | Status: AC
  Filled 2018-05-25: qty 1

## 2018-05-25 MED ORDER — SODIUM CHLORIDE 0.9 % IV SOLN
Freq: Once | INTRAVENOUS | Status: AC
  Administered 2018-05-25: 09:00:00 via INTRAVENOUS
  Filled 2018-05-25: qty 250

## 2018-05-25 MED ORDER — DIPHENHYDRAMINE HCL 50 MG/ML IJ SOLN
25.0000 mg | Freq: Once | INTRAMUSCULAR | Status: AC
  Administered 2018-05-25: 25 mg via INTRAVENOUS

## 2018-05-25 MED ORDER — SODIUM CHLORIDE 0.9% FLUSH
10.0000 mL | INTRAVENOUS | Status: DC | PRN
  Administered 2018-05-25: 10 mL
  Filled 2018-05-25: qty 10

## 2018-05-25 MED ORDER — HEPARIN SOD (PORK) LOCK FLUSH 100 UNIT/ML IV SOLN
500.0000 [IU] | Freq: Once | INTRAVENOUS | Status: AC | PRN
  Administered 2018-05-25: 500 [IU]
  Filled 2018-05-25: qty 5

## 2018-05-25 NOTE — Patient Instructions (Addendum)
[facility] Discharge Instructions for Patients Receiving Chemotherapy  Today you received the following chemotherapy agents: Taxol and Carboplatin   To help prevent nausea and vomiting after your treatment, we encourage you to take your nausea medication as directed.    If you develop nausea and vomiting that is not controlled by your nausea medication, call the clinic.   BELOW ARE SYMPTOMS THAT SHOULD BE REPORTED IMMEDIATELY:  *FEVER GREATER THAN 100.5 F  *CHILLS WITH OR WITHOUT FEVER  NAUSEA AND VOMITING THAT IS NOT CONTROLLED WITH YOUR NAUSEA MEDICATION  *UNUSUAL SHORTNESS OF BREATH  *UNUSUAL BRUISING OR BLEEDING  TENDERNESS IN MOUTH AND THROAT WITH OR WITHOUT PRESENCE OF ULCERS  *URINARY PROBLEMS  *BOWEL PROBLEMS  UNUSUAL RASH Items with * indicate a potential emergency and should be followed up as soon as possible.  Feel free to call the clinic should you have any questions or concerns. The clinic phone number is [phone].  Please show the CHEMO ALERT CARD at check-in to the Emergency Department and triage nurse.  Coronavirus (COVID-19) Are you at risk?  Are you at risk for the Coronavirus (COVID-19)?  To be considered HIGH RISK for Coronavirus (COVID-19), you have to meet the following criteria:  . Traveled to [place], [place], [place], [place] or [place]; or in the [place] to [place], [place], [place], or [place]; and have fever, cough, and shortness of breath within the last 2 weeks of travel OR . Been in close contact with a person diagnosed with COVID-19 within the last 2 weeks and have fever, cough, and shortness of breath . IF YOU DO NOT MEET THESE CRITERIA, YOU ARE CONSIDERED LOW RISK FOR COVID-19.  What to do if you are HIGH RISK for COVID-19?  . If you are having a medical emergency, call 911. . Seek medical care right away. Before you go to a doctor's office, urgent care or emergency department, call ahead and tell them  about your recent travel, contact with someone diagnosed with COVID-19, and your symptoms. You should receive instructions from your physician's office regarding next steps of care.  . When you arrive at healthcare provider, tell the healthcare staff immediately you have returned from visiting [place], [place], [place], [place] or [place]; or traveled in the [place] to [place], [place], [place], or [place]; in the last two weeks or you have been in close contact with a person diagnosed with COVID-19 in the last 2 weeks.   . Tell the health care staff about your symptoms: fever, cough and shortness of breath. . After you have been seen by a medical provider, you will be either: o Tested for (COVID-19) and discharged home on quarantine except to seek medical care if symptoms worsen, and asked to  - Stay home and avoid contact with others until you get your results (4-5 days)  - Avoid travel on public transportation if possible (such as bus, train, or airplane) or o Sent to the Emergency Department by EMS for evaluation, COVID-19 testing, and possible admission depending on your condition and test results.  What to do if you are LOW RISK for COVID-19?  Reduce your risk of any infection by using the same precautions used for avoiding the common cold or flu:  . Wash your hands often with soap and warm water for at least 20 seconds.  If soap and water are not readily available, use an alcohol-based hand sanitizer with at least 60% alcohol.  .   If coughing or sneezing, cover your mouth and nose by coughing or sneezing into the elbow areas of your shirt or coat, into a tissue or into your sleeve (not your hands). . Avoid shaking hands with others and consider head nods or verbal greetings only. . Avoid touching your eyes, nose, or mouth with unwashed hands.  . Avoid close contact with people who are sick. . Avoid places or events with large numbers of people in one location, like concerts or  sporting events. . Carefully consider travel plans you have or are making. . If you are planning any travel outside or inside the US, visit the CDC's Travelers' Health webpage for the latest health notices. . If you have some symptoms but not all symptoms, continue to monitor at home and seek medical attention if your symptoms worsen. . If you are having a medical emergency, call 911.   ADDITIONAL HEALTHCARE OPTIONS FOR PATIENTS  [facility] / e-Visit: [email or web address]         [facility]: [phone]  [facility]: [phone]                   [facility]: [phone]   

## 2018-05-31 ENCOUNTER — Other Ambulatory Visit: Payer: Self-pay | Admitting: *Deleted

## 2018-05-31 DIAGNOSIS — C50412 Malignant neoplasm of upper-outer quadrant of left female breast: Secondary | ICD-10-CM

## 2018-05-31 DIAGNOSIS — Z170 Estrogen receptor positive status [ER+]: Principal | ICD-10-CM

## 2018-06-01 ENCOUNTER — Other Ambulatory Visit: Payer: Self-pay | Admitting: Oncology

## 2018-06-01 ENCOUNTER — Inpatient Hospital Stay: Payer: Medicare Other | Attending: Oncology

## 2018-06-01 ENCOUNTER — Encounter: Payer: Self-pay | Admitting: Adult Health

## 2018-06-01 ENCOUNTER — Inpatient Hospital Stay: Payer: Medicare Other

## 2018-06-01 ENCOUNTER — Inpatient Hospital Stay (HOSPITAL_BASED_OUTPATIENT_CLINIC_OR_DEPARTMENT_OTHER): Payer: Medicare Other | Admitting: Adult Health

## 2018-06-01 ENCOUNTER — Other Ambulatory Visit: Payer: Self-pay

## 2018-06-01 VITALS — BP 139/82 | HR 86 | Temp 98.3°F | Resp 17 | Ht 62.0 in | Wt 182.3 lb

## 2018-06-01 DIAGNOSIS — C50512 Malignant neoplasm of lower-outer quadrant of left female breast: Secondary | ICD-10-CM

## 2018-06-01 DIAGNOSIS — Z5111 Encounter for antineoplastic chemotherapy: Secondary | ICD-10-CM | POA: Insufficient documentation

## 2018-06-01 DIAGNOSIS — Z171 Estrogen receptor negative status [ER-]: Secondary | ICD-10-CM

## 2018-06-01 DIAGNOSIS — C50412 Malignant neoplasm of upper-outer quadrant of left female breast: Secondary | ICD-10-CM | POA: Diagnosis not present

## 2018-06-01 DIAGNOSIS — Z95828 Presence of other vascular implants and grafts: Secondary | ICD-10-CM

## 2018-06-01 DIAGNOSIS — Z79899 Other long term (current) drug therapy: Secondary | ICD-10-CM | POA: Insufficient documentation

## 2018-06-01 DIAGNOSIS — Z170 Estrogen receptor positive status [ER+]: Secondary | ICD-10-CM

## 2018-06-01 LAB — CBC WITH DIFFERENTIAL (CANCER CENTER ONLY)
Abs Immature Granulocytes: 0.02 10*3/uL (ref 0.00–0.07)
Basophils Absolute: 0.1 10*3/uL (ref 0.0–0.1)
Basophils Relative: 2 %
Eosinophils Absolute: 0.1 10*3/uL (ref 0.0–0.5)
Eosinophils Relative: 2 %
HCT: 29.7 % — ABNORMAL LOW (ref 36.0–46.0)
Hemoglobin: 9.1 g/dL — ABNORMAL LOW (ref 12.0–15.0)
Immature Granulocytes: 0 %
Lymphocytes Relative: 20 %
Lymphs Abs: 1 10*3/uL (ref 0.7–4.0)
MCH: 29.1 pg (ref 26.0–34.0)
MCHC: 30.6 g/dL (ref 30.0–36.0)
MCV: 94.9 fL (ref 80.0–100.0)
Monocytes Absolute: 0.4 10*3/uL (ref 0.1–1.0)
Monocytes Relative: 8 %
Neutro Abs: 3.4 10*3/uL (ref 1.7–7.7)
Neutrophils Relative %: 68 %
Platelet Count: 237 10*3/uL (ref 150–400)
RBC: 3.13 MIL/uL — ABNORMAL LOW (ref 3.87–5.11)
RDW: 18 % — ABNORMAL HIGH (ref 11.5–15.5)
WBC Count: 5 10*3/uL (ref 4.0–10.5)
nRBC: 0 % (ref 0.0–0.2)

## 2018-06-01 LAB — CMP (CANCER CENTER ONLY)
ALT: 23 U/L (ref 0–44)
AST: 16 U/L (ref 15–41)
Albumin: 3.6 g/dL (ref 3.5–5.0)
Alkaline Phosphatase: 89 U/L (ref 38–126)
Anion gap: 9 (ref 5–15)
BUN: 13 mg/dL (ref 8–23)
CO2: 26 mmol/L (ref 22–32)
Calcium: 9.5 mg/dL (ref 8.9–10.3)
Chloride: 107 mmol/L (ref 98–111)
Creatinine: 0.74 mg/dL (ref 0.44–1.00)
GFR, Est AFR Am: >60 mL/min (ref >60)
GFR, Estimated: >60 mL/min (ref >60)
Glucose, Bld: 143 mg/dL — ABNORMAL HIGH (ref 70–99)
Potassium: 3.1 mmol/L — ABNORMAL LOW (ref 3.5–5.1)
Sodium: 142 mmol/L (ref 135–145)
Total Bilirubin: 0.2 mg/dL — ABNORMAL LOW (ref 0.3–1.2)
Total Protein: 6.8 g/dL (ref 6.5–8.1)

## 2018-06-01 MED ORDER — DIPHENHYDRAMINE HCL 50 MG/ML IJ SOLN
INTRAMUSCULAR | Status: AC
  Filled 2018-06-01: qty 1

## 2018-06-01 MED ORDER — SODIUM CHLORIDE 0.9% FLUSH
10.0000 mL | Freq: Once | INTRAVENOUS | Status: AC
  Administered 2018-06-01: 10 mL
  Filled 2018-06-01: qty 10

## 2018-06-01 MED ORDER — PALONOSETRON HCL INJECTION 0.25 MG/5ML
INTRAVENOUS | Status: AC
  Filled 2018-06-01: qty 5

## 2018-06-01 MED ORDER — SODIUM CHLORIDE 0.9 % IV SOLN
20.0000 mg | Freq: Once | INTRAVENOUS | Status: AC
  Administered 2018-06-01: 15:00:00 20 mg via INTRAVENOUS
  Filled 2018-06-01: qty 2

## 2018-06-01 MED ORDER — HEPARIN SOD (PORK) LOCK FLUSH 100 UNIT/ML IV SOLN
500.0000 [IU] | Freq: Once | INTRAVENOUS | Status: AC | PRN
  Administered 2018-06-01: 17:00:00 500 [IU]
  Filled 2018-06-01: qty 5

## 2018-06-01 MED ORDER — SODIUM CHLORIDE 0.9 % IV SOLN
Freq: Once | INTRAVENOUS | Status: AC
  Administered 2018-06-01: 14:00:00 via INTRAVENOUS
  Filled 2018-06-01: qty 250

## 2018-06-01 MED ORDER — SODIUM CHLORIDE 0.9% FLUSH
10.0000 mL | INTRAVENOUS | Status: DC | PRN
  Administered 2018-06-01: 10 mL
  Filled 2018-06-01: qty 10

## 2018-06-01 MED ORDER — SODIUM CHLORIDE 0.9 % IV SOLN
187.2000 mg | Freq: Once | INTRAVENOUS | Status: AC
  Administered 2018-06-01: 190 mg via INTRAVENOUS
  Filled 2018-06-01: qty 19

## 2018-06-01 MED ORDER — SODIUM CHLORIDE 0.9 % IV SOLN
80.0000 mg/m2 | Freq: Once | INTRAVENOUS | Status: AC
  Administered 2018-06-01: 15:00:00 150 mg via INTRAVENOUS
  Filled 2018-06-01: qty 25

## 2018-06-01 MED ORDER — DEXAMETHASONE SODIUM PHOSPHATE 10 MG/ML IJ SOLN
4.0000 mg | Freq: Once | INTRAMUSCULAR | Status: AC
  Administered 2018-06-01: 14:00:00 4 mg via INTRAVENOUS

## 2018-06-01 MED ORDER — PALONOSETRON HCL INJECTION 0.25 MG/5ML
0.2500 mg | Freq: Once | INTRAVENOUS | Status: AC
  Administered 2018-06-01: 14:00:00 0.25 mg via INTRAVENOUS

## 2018-06-01 MED ORDER — DEXAMETHASONE SODIUM PHOSPHATE 10 MG/ML IJ SOLN
INTRAMUSCULAR | Status: AC
  Filled 2018-06-01: qty 1

## 2018-06-01 MED ORDER — DIPHENHYDRAMINE HCL 50 MG/ML IJ SOLN
25.0000 mg | Freq: Once | INTRAMUSCULAR | Status: AC
  Administered 2018-06-01: 14:00:00 25 mg via INTRAVENOUS

## 2018-06-01 NOTE — Progress Notes (Signed)
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 She underwent bilateral diagnostic mammography with tomography and left breast ultrasonography at [facility] on 02/16/2018 showing: breast density category C; two left breast masses, one at 2 o'clock and the other at 3:30 o'clock. The 2 o'clock mass (1.3 x 1 x 1 cm) corresponds to the mammographic abnormality and is highly suspicious for breast carcinoma. The other mass (1.2 x 0.7 x 1.2 cm)  is suspicious for breast carcinoma. No left axillary adenopathy.   Accordingly on 02/19/2018 she proceeded to biopsy of the left breast area in question. 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 Prognostic indicators significant for: estrogen receptor, 90% positive and progesterone receptor, 95% positive, both with strong staining intensity. Proliferation marker Ki67 at 5%. HER2 negative (1+).   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 Prognostic indicators significant for: estrogen receptor, 10% positive with moderate staining intensity and progesterone receptor, 0%  negative. Proliferation marker Ki67 at 40%. HER2 negative (1+).   The patient's subsequent history is as detailed below.   INTERVAL HISTORY: Emily Chambers returns today for follow-up and treatment of her estrogen positive and estrogen negative breast cancer.   She continues on paclitaxel and carboplatin weekly x12. Today is day 1 cycle  4.  She is tolerating these well.  She has no signs of peripheral neuropathy.    REVIEW OF SYSTEMS: Emily Chambers is tolerating chemotherapy well.  She is staying as active as she can be at home with the COVID 19 restrictions in place.  She denies any fever, chills, chest pain, palpitations, cough, shortness of breath.  She is without nausea, vomiting, bowel or bladder issues.  A detailed ROS was otherwise non contributory today.     PAST MEDICAL HISTORY: Past Medical History:  Diagnosis Date  . Family history of breast cancer   . Family history of lung cancer   . Family history of throat cancer   . High cholesterol   . Hypertension   . Kidney stone   . Kidney stones   . UTI (lower urinary tract infection)     PAST SURGICAL HISTORY: Past Surgical History:  Procedure Laterality Date  . BREAST EXCISIONAL BIOPSY Left 1999,2005   cysts removed  . BREAST SURGERY Left    cyst and biopsy  . PORTACATH PLACEMENT Left 03/10/2018   Procedure: INSERTION PORT-A-CATH;  Surgeon: Stark Klein, MD;  Location: [facility];  Service: General;  Laterality: Left;    FAMILY HISTORY: Family History  Problem Relation Age of Onset  . Hypertension Mother   . Heart disease Mother   . Dementia Mother   . Healthy Father   . Colon cancer Maternal Grandmother   . Cancer Paternal Grandmother  unk type  . Lung cancer Cousin        39s  . Breast cancer Cousin        8s  . Breast cancer Cousin        65s  . Breast cancer Cousin   . Cancer Paternal Aunt        unk type d. 68, possibly pancreatic  . Cancer Paternal Aunt        unk type d. 8s  . Cancer  Paternal Uncle        unk type   . Cancer Paternal Uncle        unk type d. 43s  . Cancer Maternal Aunt        unk type, d. 64s  . Throat cancer Maternal Uncle        d. 93s  . Ovarian cancer Neg Hx    Patient father is alive at 16 years old. Patient mother died from heart disease and dementia at age 38.  The patient denies a family hx of ovarian cancer. She has 3 siblings, 1 brother and 2 sisters. She has a maternal cousin diagnosed with lung cancer in her 102s. She has 3 paternal cousins with breast cancer, one was diagnosed in her 8s and has passed away.   GYNECOLOGIC HISTORY:  No LMP recorded. Patient is postmenopausal. Menarche: 68 years old Age at first live birth: n/a GX P 0 LMP 2010 Contraceptive n/a HRT no  Hysterectomy? no BSo? no   SOCIAL HISTORY: She is single and works as a Scientist, water quality at [employer]). She lives alone, with no pets.    ADVANCED DIRECTIVES: Not in place.  At the 03/03/2018 visit she was given the appropriate documents to complete and notarize at her discretion.  She is planning to name her niece, Emily Chambers, as her HCPOA.   HEALTH MAINTENANCE: Social History   Tobacco Use  . Smoking status: Never Smoker  . Smokeless tobacco: Never Used  Substance Use Topics  . Alcohol use: Yes    Comment: seldom  . Drug use: No     Colonoscopy: 2014? [facility]  PAP: 02/02/2018  Bone density: 03/15/2016, T-score 0.4, Dr. Alden Hipp   No Known Allergies  Current Outpatient Medications  Medication Sig Dispense Refill  . amLODipine (NORVASC) 10 MG tablet Take 10 mg by mouth every morning.     Marland Kitchen aspirin 81 MG chewable tablet Chew 81 mg by mouth daily.    . ciprofloxacin (CIPRO) 500 MG tablet Take 1 tablet (500 mg total) by mouth 2 (two) times daily. 10 tablet 0  . dexamethasone (DECADRON) 4 MG tablet Take 2 tablets by mouth twice a day for three days starting the day after chemotherapy. Take with food. 30 tablet 1  .  lidocaine-prilocaine (EMLA) cream Apply 1 application topically as needed. 30 g 0  . LORazepam (ATIVAN) 0.5 MG tablet Take 1 tablet (0.5 mg total) by mouth every 6 (six) hours as needed (Nausea or vomiting). 30 tablet 0  . losartan (COZAAR) 100 MG tablet Take 100 mg by mouth every morning.     Marland Kitchen omeprazole (PRILOSEC) 40 MG capsule Take 1 capsule (40 mg total) by mouth daily. 30 capsule 5  . oxyCODONE (OXY IR/ROXICODONE) 5 MG immediate release tablet Take 1 tablet (5 mg total) by mouth every 6 (six) hours as needed for severe pain. 10 tablet 0  . potassium chloride SA (K-DUR,KLOR-CON) 20 MEQ tablet Take 1 tablet (20 mEq total)  by mouth daily. 5 tablet 0  . pravastatin (PRAVACHOL) 20 MG tablet Take 20 mg by mouth every morning.     . prochlorperazine (COMPAZINE) 10 MG tablet Take 1 tablet (10 mg total) by mouth every 6 (six) hours as needed (Nausea or vomiting). 30 tablet 1   No current facility-administered medications for this visit.     OBJECTIVE: Middle-aged African-American woman who appears well  Vitals:   06/01/18 1304  BP: 139/82  Pulse: 86  Resp: 17  Temp: 98.3 F (36.8 C)  SpO2: 100%     Body mass index is 33.34 kg/m.   Wt Readings from Last 3 Encounters:  06/01/18 182 lb 4.8 oz (82.7 kg)  05/18/18 180 lb 3.2 oz (81.7 kg)  05/11/18 176 lb 1.6 oz (79.9 kg)  ECOG FS:1 GENERAL: Patient is a well appearing female in no acute distress HEENT:  Sclerae anicteric.  Oropharynx clear and moist. No ulcerations or evidence of oropharyngeal candidiasis. Neck is supple.  NODES:  No cervical, supraclavicular, or axillary lymphadenopathy palpated.  BREAST EXAM:  Deferred. LUNGS:  Clear to auscultation bilaterally.  No wheezes or rhonchi. HEART:  Regular rate and rhythm. No murmur appreciated. ABDOMEN:  Soft, nontender.  Positive, normoactive bowel sounds. No organomegaly palpated. MSK:  No focal spinal tenderness to palpation. Full range of motion bilaterally in the upper extremities.  EXTREMITIES:  No peripheral edema.   SKIN:  Clear with no obvious rashes or skin changes. + nail discoloration at the base of her nail beds. NEURO:  Nonfocal. Well oriented.  Appropriate affect.     LAB RESULTS:  CMP     Component Value Date/Time   NA 143 05/25/2018 0821   K 3.9 05/25/2018 0821   CL 108 05/25/2018 0821   CO2 25 05/25/2018 0821   GLUCOSE 112 (H) 05/25/2018 0821   BUN 16 05/25/2018 0821   CREATININE 0.76 05/25/2018 0821   CALCIUM 9.5 05/25/2018 0821   PROT 6.6 05/25/2018 0821   ALBUMIN 3.6 05/25/2018 0821   AST 14 (L) 05/25/2018 0821   ALT 25 05/25/2018 0821   ALKPHOS 89 05/25/2018 0821   BILITOT 0.3 05/25/2018 0821   GFRNONAA >60 05/25/2018 0821   GFRAA >60 05/25/2018 0821    No results found for: TOTALPROTELP, ALBUMINELP, A1GS, A2GS, BETS, BETA2SER, GAMS, MSPIKE, SPEI  No results found for: KPAFRELGTCHN, LAMBDASER, KAPLAMBRATIO  Lab Results  Component Value Date   WBC 5.0 06/01/2018   NEUTROABS 3.4 06/01/2018   HGB 9.1 (L) 06/01/2018   HCT 29.7 (L) 06/01/2018   MCV 94.9 06/01/2018   PLT 237 06/01/2018    _0 @  No results found for: LABCA2  No components found for: HDQQIW979  No results for input(s): INR in the last 168 hours.  No results found for: LABCA2  No results found for: GXQ119  No results found for: ERD408  No results found for: XKG818  No results found for: CA2729  No components found for: HGQUANT  No results found for: CEA1 / No results found for: CEA1   No results found for: AFPTUMOR  No results found for: CHROMOGRNA  No results found for: PSA1  Appointment on 06/01/2018  Component Date Value Ref Range Status  . WBC Count 06/01/2018 5.0  4.0 - 10.5 K/uL Final  . RBC 06/01/2018 3.13* 3.87 - 5.11 MIL/uL Final  . Hemoglobin 06/01/2018 9.1* 12.0 - 15.0 g/dL Final  . HCT 06/01/2018 29.7* 36.0 - 46.0 % Final  . MCV 06/01/2018 94.9  80.0 -  100.0 fL Final  . MCH 06/01/2018 29.1  26.0 - 34.0 pg Final  .  MCHC 06/01/2018 30.6  30.0 - 36.0 g/dL Final  . RDW 06/01/2018 18.0* 11.5 - 15.5 % Final  . Platelet Count 06/01/2018 237  150 - 400 K/uL Final  . nRBC 06/01/2018 0.0  0.0 - 0.2 % Final  . Neutrophils Relative % 06/01/2018 68  % Final  . Neutro Abs 06/01/2018 3.4  1.7 - 7.7 K/uL Final  . Lymphocytes Relative 06/01/2018 20  % Final  . Lymphs Abs 06/01/2018 1.0  0.7 - 4.0 K/uL Final  . Monocytes Relative 06/01/2018 8  % Final  . Monocytes Absolute 06/01/2018 0.4  0.1 - 1.0 K/uL Final  . Eosinophils Relative 06/01/2018 2  % Final  . Eosinophils Absolute 06/01/2018 0.1  0.0 - 0.5 K/uL Final  . Basophils Relative 06/01/2018 2  % Final  . Basophils Absolute 06/01/2018 0.1  0.0 - 0.1 K/uL Final  . Immature Granulocytes 06/01/2018 0  % Final  . Abs Immature Granulocytes 06/01/2018 0.02  0.00 - 0.07 K/uL Final   Performed at [facility], [address] [place], [place] [address]    (this displays the last labs from the last 3 days)  No results found for: TOTALPROTELP, ALBUMINELP, A1GS, A2GS, BETS, BETA2SER, GAMS, MSPIKE, SPEI (this displays SPEP labs)  No results found for: KPAFRELGTCHN, LAMBDASER, KAPLAMBRATIO (kappa/lambda light chains)  No results found for: HGBA, HGBA2QUANT, HGBFQUANT, HGBSQUAN (Hemoglobinopathy evaluation)   No results found for: LDH  No results found for: IRON, TIBC, IRONPCTSAT (Iron and TIBC)  No results found for: FERRITIN  Urinalysis    Component Value Date/Time   COLORURINE YELLOW 04/12/2018 1504   APPEARANCEUR CLEAR 04/12/2018 1504   LABSPEC 1.020 04/12/2018 1504   PHURINE 5.0 04/12/2018 1504   GLUCOSEU NEGATIVE 04/12/2018 1504   HGBUR NEGATIVE 04/12/2018 1504   BILIRUBINUR NEGATIVE 04/12/2018 1504   KETONESUR NEGATIVE 04/12/2018 1504   PROTEINUR NEGATIVE 04/12/2018 1504   UROBILINOGEN 0.2 07/07/2012 1159   NITRITE NEGATIVE 04/12/2018 1504   LEUKOCYTESUR TRACE (A) 04/12/2018 1504     STUDIES: No results found.    ELIGIBLE FOR AVAILABLE RESEARCH PROTOCOL: Upbeat   ASSESSMENT: 68 y.o. 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, grade 2, estrogen and progesterone receptor positive, HER-2 not amplified, with an MIB-1 of 5%  (b) in the lower outer quadrant a clinical T1c N0, stage IA-B invasive ductal carcinoma, grade 2, estrogen receptor only moderately positive at 10%, progesterone receptor negative, with an MIB-1 of 40%, and HER-2 not amplified  (1) neoadjuvant chemotherapy will consist of doxorubicin and cyclophosphamide in dose dense fashion x4 starting 03/16/2018, followed by paclitaxel and carboplatin weekly x12  (a) echo on 03/11/2018 shows well preserved EF of 60-65%  (2) definitive surgery to follow  (3) adjuvant radiation as appropriate  (4) antiestrogens to follow at the completion of local treatment (for upper outer quadrant tumor)  (5) genetics testing on 03/24/2018 showed no pathogenic mutations.  Genes tested include:  APC, ATM, AXIN2, BARD1, BMPR1A, BRCA1, BRCA2, BRIP1, CDH1, CDKN2A (p14ARF), CDKN2A (p16INK4a), CKD4, CHEK2, CTNNA1, DICER1, EPCAM (Deletion/duplication testing only), GREM1 (promoter region deletion/duplication testing only), KIT, MEN1, MLH1, MSH2, MSH3, MSH6, MUTYH, NBN, NF1, NHTL1, PALB2, PDGFRA, PMS2, POLD1, POLE, PTEN, RAD50, RAD51C, RAD51D, SDHB, SDHC, SDHD, SMAD4, SMARCA4. STK11, TP53, TSC1, TSC2, and VHL.  The  following genes were evaluated for sequence changes only: SDHA and HOXB13 c.251G>A variant only.   PLAN: Mckell is doing well today.  Her labs are stable and I reviewed those with her in detail.  She will proceed with chemotherapy today.  She is tolerating  It well. She will conitnue to watch closely for any peripheral neuropathy symptoms.  We reviewed COVID19 recommendations and she is following these.    She will receive the Paclitaxel  and Carboplatin weekly and we will see her with every other cycle at this point.  She knows to call for any other issues that may develop before the next visit here.  A total of (20) minutes of face-to-face time was spent with this patient with greater than 50% of that time in counseling and care-coordination.   Wilber Bihari, NP  06/01/18 1:15 PM Medical Oncology and Hematology [facility] [address] [place], [place] [address] Tel. [phone]    Fax. [phone]

## 2018-06-01 NOTE — Patient Instructions (Signed)
[facility] Discharge Instructions for Patients Receiving Chemotherapy  Today you received the following chemotherapy agents: Taxol and Carboplatin   To help prevent nausea and vomiting after your treatment, we encourage you to take your nausea medication as directed.    If you develop nausea and vomiting that is not controlled by your nausea medication, call the clinic.   BELOW ARE SYMPTOMS THAT SHOULD BE REPORTED IMMEDIATELY:  *FEVER GREATER THAN 100.5 F  *CHILLS WITH OR WITHOUT FEVER  NAUSEA AND VOMITING THAT IS NOT CONTROLLED WITH YOUR NAUSEA MEDICATION  *UNUSUAL SHORTNESS OF BREATH  *UNUSUAL BRUISING OR BLEEDING  TENDERNESS IN MOUTH AND THROAT WITH OR WITHOUT PRESENCE OF ULCERS  *URINARY PROBLEMS  *BOWEL PROBLEMS  UNUSUAL RASH Items with * indicate a potential emergency and should be followed up as soon as possible.  Feel free to call the clinic should you have any questions or concerns. The clinic phone number is [phone].  Please show the CHEMO ALERT CARD at check-in to the Emergency Department and triage nurse.  Coronavirus (COVID-19) Are you at risk?  Are you at risk for the Coronavirus (COVID-19)?  To be considered HIGH RISK for Coronavirus (COVID-19), you have to meet the following criteria:  . Traveled to [place], [place], [place], [place] or [place]; or in the [place] to [place], [place], [place], or [place]; and have fever, cough, and shortness of breath within the last 2 weeks of travel OR . Been in close contact with a person diagnosed with COVID-19 within the last 2 weeks and have fever, cough, and shortness of breath . IF YOU DO NOT MEET THESE CRITERIA, YOU ARE CONSIDERED LOW RISK FOR COVID-19.  What to do if you are HIGH RISK for COVID-19?  . If you are having a medical emergency, call 911. . Seek medical care right away. Before you go to a doctor's office, urgent care or emergency department, call ahead and tell them  about your recent travel, contact with someone diagnosed with COVID-19, and your symptoms. You should receive instructions from your physician's office regarding next steps of care.  . When you arrive at healthcare provider, tell the healthcare staff immediately you have returned from visiting [place], [place], [place], [place] or [place]; or traveled in the [place] to [place], [place], [place], or [place]; in the last two weeks or you have been in close contact with a person diagnosed with COVID-19 in the last 2 weeks.   . Tell the health care staff about your symptoms: fever, cough and shortness of breath. . After you have been seen by a medical provider, you will be either: o Tested for (COVID-19) and discharged home on quarantine except to seek medical care if symptoms worsen, and asked to  - Stay home and avoid contact with others until you get your results (4-5 days)  - Avoid travel on public transportation if possible (such as bus, train, or airplane) or o Sent to the Emergency Department by EMS for evaluation, COVID-19 testing, and possible admission depending on your condition and test results.  What to do if you are LOW RISK for COVID-19?  Reduce your risk of any infection by using the same precautions used for avoiding the common cold or flu:  . Wash your hands often with soap and warm water for at least 20 seconds.  If soap and water are not readily available, use an alcohol-based hand sanitizer with at least 60% alcohol.  .   If coughing or sneezing, cover your mouth and nose by coughing or sneezing into the elbow areas of your shirt or coat, into a tissue or into your sleeve (not your hands). . Avoid shaking hands with others and consider head nods or verbal greetings only. . Avoid touching your eyes, nose, or mouth with unwashed hands.  . Avoid close contact with people who are sick. . Avoid places or events with large numbers of people in one location, like concerts or  sporting events. . Carefully consider travel plans you have or are making. . If you are planning any travel outside or inside the [place], visit the CDC's Travelers' Health webpage for the latest health notices. . If you have some symptoms but not all symptoms, continue to monitor at home and seek medical attention if your symptoms worsen. . If you are having a medical emergency, call 911.   ADDITIONAL HEALTHCARE OPTIONS FOR PATIENTS  [facility] / e-Visit: [email or web address]         [facility]: [phone]   Urgent Care: [phone]                   [facility]: [phone]   

## 2018-06-04 ENCOUNTER — Encounter: Payer: Self-pay | Admitting: Oncology

## 2018-06-04 NOTE — Progress Notes (Signed)
Advised patient previously I would apply on her behalf if funds opened up.  Received notification breast cancer funds opened through [employer].  Applied online on behalf of patient. Application was pending due to high volume.

## 2018-06-07 ENCOUNTER — Other Ambulatory Visit: Payer: Self-pay

## 2018-06-07 ENCOUNTER — Encounter: Payer: Self-pay | Admitting: Oncology

## 2018-06-07 DIAGNOSIS — Z171 Estrogen receptor negative status [ER-]: Principal | ICD-10-CM

## 2018-06-07 DIAGNOSIS — C50512 Malignant neoplasm of lower-outer quadrant of left female breast: Secondary | ICD-10-CM

## 2018-06-07 NOTE — Progress Notes (Signed)
Applied for copay assistance for patient through [employer].  Patient approved. Obtained physician signature on physician form and uploaded to protal.  Patient approved for guaranteed $2500 and if need exceeds $2500 an additional $13,500 will be accessible without re-application or paperwork giving her a maximum of $16,000.  A copy of the award letter will be mailed to patient for her records only.  Copy given to [facility] for billing/copay submissions.  Patient has my card for any additional financial questions or concerns.

## 2018-06-08 ENCOUNTER — Inpatient Hospital Stay: Payer: Medicare Other

## 2018-06-08 ENCOUNTER — Other Ambulatory Visit: Payer: Self-pay

## 2018-06-08 VITALS — BP 130/90 | HR 78 | Temp 98.3°F | Resp 18

## 2018-06-08 DIAGNOSIS — C50512 Malignant neoplasm of lower-outer quadrant of left female breast: Secondary | ICD-10-CM

## 2018-06-08 DIAGNOSIS — Z171 Estrogen receptor negative status [ER-]: Principal | ICD-10-CM

## 2018-06-08 DIAGNOSIS — Z170 Estrogen receptor positive status [ER+]: Secondary | ICD-10-CM

## 2018-06-08 DIAGNOSIS — Z95828 Presence of other vascular implants and grafts: Secondary | ICD-10-CM

## 2018-06-08 DIAGNOSIS — Z5111 Encounter for antineoplastic chemotherapy: Secondary | ICD-10-CM | POA: Diagnosis not present

## 2018-06-08 DIAGNOSIS — C50412 Malignant neoplasm of upper-outer quadrant of left female breast: Secondary | ICD-10-CM

## 2018-06-08 LAB — CBC WITH DIFFERENTIAL (CANCER CENTER ONLY)
Abs Immature Granulocytes: 0.03 10*3/uL (ref 0.00–0.07)
Basophils Absolute: 0.1 10*3/uL (ref 0.0–0.1)
Basophils Relative: 1 %
Eosinophils Absolute: 0 10*3/uL (ref 0.0–0.5)
Eosinophils Relative: 1 %
HCT: 31 % — ABNORMAL LOW (ref 36.0–46.0)
Hemoglobin: 9.6 g/dL — ABNORMAL LOW (ref 12.0–15.0)
Immature Granulocytes: 1 %
Lymphocytes Relative: 19 %
Lymphs Abs: 1 10*3/uL (ref 0.7–4.0)
MCH: 29.3 pg (ref 26.0–34.0)
MCHC: 31 g/dL (ref 30.0–36.0)
MCV: 94.5 fL (ref 80.0–100.0)
Monocytes Absolute: 0.4 10*3/uL (ref 0.1–1.0)
Monocytes Relative: 8 %
Neutro Abs: 3.7 10*3/uL (ref 1.7–7.7)
Neutrophils Relative %: 70 %
Platelet Count: 207 10*3/uL (ref 150–400)
RBC: 3.28 MIL/uL — ABNORMAL LOW (ref 3.87–5.11)
RDW: 17.1 % — ABNORMAL HIGH (ref 11.5–15.5)
WBC Count: 5.2 10*3/uL (ref 4.0–10.5)
nRBC: 0 % (ref 0.0–0.2)

## 2018-06-08 LAB — CMP (CANCER CENTER ONLY)
ALT: 25 U/L (ref 0–44)
AST: 17 U/L (ref 15–41)
Albumin: 3.8 g/dL (ref 3.5–5.0)
Alkaline Phosphatase: 112 U/L (ref 38–126)
Anion gap: 13 (ref 5–15)
BUN: 17 mg/dL (ref 8–23)
CO2: 24 mmol/L (ref 22–32)
Calcium: 10.3 mg/dL (ref 8.9–10.3)
Chloride: 106 mmol/L (ref 98–111)
Creatinine: 0.76 mg/dL (ref 0.44–1.00)
GFR, Est AFR Am: >60 mL/min (ref >60)
GFR, Estimated: >60 mL/min (ref >60)
Glucose, Bld: 132 mg/dL — ABNORMAL HIGH (ref 70–99)
Potassium: 3.4 mmol/L — ABNORMAL LOW (ref 3.5–5.1)
Sodium: 143 mmol/L (ref 135–145)
Total Bilirubin: <0.2 mg/dL — ABNORMAL LOW (ref 0.3–1.2)
Total Protein: 7.1 g/dL (ref 6.5–8.1)

## 2018-06-08 MED ORDER — SODIUM CHLORIDE 0.9 % IV SOLN
20.0000 mg | Freq: Once | INTRAVENOUS | Status: AC
  Administered 2018-06-08: 20 mg via INTRAVENOUS
  Filled 2018-06-08: qty 2

## 2018-06-08 MED ORDER — SODIUM CHLORIDE 0.9 % IV SOLN
187.2000 mg | Freq: Once | INTRAVENOUS | Status: AC
  Administered 2018-06-08: 15:00:00 190 mg via INTRAVENOUS
  Filled 2018-06-08: qty 19

## 2018-06-08 MED ORDER — SODIUM CHLORIDE 0.9 % IV SOLN
80.0000 mg/m2 | Freq: Once | INTRAVENOUS | Status: AC
  Administered 2018-06-08: 150 mg via INTRAVENOUS
  Filled 2018-06-08: qty 25

## 2018-06-08 MED ORDER — DIPHENHYDRAMINE HCL 50 MG/ML IJ SOLN
25.0000 mg | Freq: Once | INTRAMUSCULAR | Status: AC
  Administered 2018-06-08: 13:00:00 25 mg via INTRAVENOUS

## 2018-06-08 MED ORDER — SODIUM CHLORIDE 0.9% FLUSH
10.0000 mL | INTRAVENOUS | Status: DC | PRN
  Filled 2018-06-08: qty 10

## 2018-06-08 MED ORDER — HEPARIN SOD (PORK) LOCK FLUSH 100 UNIT/ML IV SOLN
500.0000 [IU] | Freq: Once | INTRAVENOUS | Status: DC | PRN
  Filled 2018-06-08: qty 5

## 2018-06-08 MED ORDER — PALONOSETRON HCL INJECTION 0.25 MG/5ML
INTRAVENOUS | Status: AC
  Filled 2018-06-08: qty 5

## 2018-06-08 MED ORDER — DIPHENHYDRAMINE HCL 50 MG/ML IJ SOLN
INTRAMUSCULAR | Status: AC
  Filled 2018-06-08: qty 1

## 2018-06-08 MED ORDER — SODIUM CHLORIDE 0.9 % IV SOLN
Freq: Once | INTRAVENOUS | Status: AC
  Administered 2018-06-08: 13:00:00 via INTRAVENOUS
  Filled 2018-06-08: qty 250

## 2018-06-08 MED ORDER — PALONOSETRON HCL INJECTION 0.25 MG/5ML
0.2500 mg | Freq: Once | INTRAVENOUS | Status: AC
  Administered 2018-06-08: 13:00:00 0.25 mg via INTRAVENOUS

## 2018-06-08 MED ORDER — DEXAMETHASONE SODIUM PHOSPHATE 10 MG/ML IJ SOLN
4.0000 mg | Freq: Once | INTRAMUSCULAR | Status: AC
  Administered 2018-06-08: 4 mg via INTRAVENOUS

## 2018-06-08 MED ORDER — DEXAMETHASONE SODIUM PHOSPHATE 10 MG/ML IJ SOLN
INTRAMUSCULAR | Status: AC
  Filled 2018-06-08: qty 1

## 2018-06-08 MED ORDER — SODIUM CHLORIDE 0.9% FLUSH
10.0000 mL | Freq: Once | INTRAVENOUS | Status: AC
  Administered 2018-06-08: 10 mL
  Filled 2018-06-08: qty 10

## 2018-06-08 NOTE — Patient Instructions (Signed)
[facility] Discharge Instructions for Patients Receiving Chemotherapy  Today you received the following chemotherapy agents: Taxol and Carboplatin   To help prevent nausea and vomiting after your treatment, we encourage you to take your nausea medication as directed.    If you develop nausea and vomiting that is not controlled by your nausea medication, call the clinic.   BELOW ARE SYMPTOMS THAT SHOULD BE REPORTED IMMEDIATELY:  *FEVER GREATER THAN 100.5 F  *CHILLS WITH OR WITHOUT FEVER  NAUSEA AND VOMITING THAT IS NOT CONTROLLED WITH YOUR NAUSEA MEDICATION  *UNUSUAL SHORTNESS OF BREATH  *UNUSUAL BRUISING OR BLEEDING  TENDERNESS IN MOUTH AND THROAT WITH OR WITHOUT PRESENCE OF ULCERS  *URINARY PROBLEMS  *BOWEL PROBLEMS  UNUSUAL RASH Items with * indicate a potential emergency and should be followed up as soon as possible.  Feel free to call the clinic should you have any questions or concerns. The clinic phone number is [phone].  Please show the CHEMO ALERT CARD at check-in to the Emergency Department and triage nurse.  Coronavirus (COVID-19) Are you at risk?  Are you at risk for the Coronavirus (COVID-19)?  To be considered HIGH RISK for Coronavirus (COVID-19), you have to meet the following criteria:  . Traveled to [place], [place], [place], [place] or [place]; or in the [place] to [place], [place], [place], or [place]; and have fever, cough, and shortness of breath within the last 2 weeks of travel OR . Been in close contact with a person diagnosed with COVID-19 within the last 2 weeks and have fever, cough, and shortness of breath . IF YOU DO NOT MEET THESE CRITERIA, YOU ARE CONSIDERED LOW RISK FOR COVID-19.  What to do if you are HIGH RISK for COVID-19?  . If you are having a medical emergency, call 911. . Seek medical care right away. Before you go to a doctor's office, urgent care or emergency department, call ahead and tell them  about your recent travel, contact with someone diagnosed with COVID-19, and your symptoms. You should receive instructions from your physician's office regarding next steps of care.  . When you arrive at healthcare provider, tell the healthcare staff immediately you have returned from visiting [place], [place], [place], [place] or [place]; or traveled in the [place] to [place], [place], [place], or [place]; in the last two weeks or you have been in close contact with a person diagnosed with COVID-19 in the last 2 weeks.   . Tell the health care staff about your symptoms: fever, cough and shortness of breath. . After you have been seen by a medical provider, you will be either: o Tested for (COVID-19) and discharged home on quarantine except to seek medical care if symptoms worsen, and asked to  - Stay home and avoid contact with others until you get your results (4-5 days)  - Avoid travel on public transportation if possible (such as bus, train, or airplane) or o Sent to the Emergency Department by EMS for evaluation, COVID-19 testing, and possible admission depending on your condition and test results.  What to do if you are LOW RISK for COVID-19?  Reduce your risk of any infection by using the same precautions used for avoiding the common cold or flu:  . Wash your hands often with soap and warm water for at least 20 seconds.  If soap and water are not readily available, use an alcohol-based hand sanitizer with at least 60% alcohol.  .   If coughing or sneezing, cover your mouth and nose by coughing or sneezing into the elbow areas of your shirt or coat, into a tissue or into your sleeve (not your hands). . Avoid shaking hands with others and consider head nods or verbal greetings only. . Avoid touching your eyes, nose, or mouth with unwashed hands.  . Avoid close contact with people who are sick. . Avoid places or events with large numbers of people in one location, like concerts or  sporting events. . Carefully consider travel plans you have or are making. . If you are planning any travel outside or inside the [place], visit the CDC's Travelers' Health webpage for the latest health notices. . If you have some symptoms but not all symptoms, continue to monitor at home and seek medical attention if your symptoms worsen. . If you are having a medical emergency, call 911.   ADDITIONAL HEALTHCARE OPTIONS FOR PATIENTS  [facility] / e-Visit: [email or web address]         [facility]: [phone]  [facility]: [phone]                   [facility]: [phone]   

## 2018-06-10 ENCOUNTER — Encounter: Payer: Self-pay | Admitting: General Practice

## 2018-06-10 NOTE — Progress Notes (Signed)
[facility] Support Services   [facility] Support Services Team contacted patient to assess for food insecurity and other psychosocial needs during current COVID19 pandemic.    Patient/family expressed no needs at this time.  Support Team member encouraged patient to call if changes occur or they have any other questions/concerns.    Pauline Pegues C Lajuan Godbee, LCSW  [facility]        

## 2018-06-15 ENCOUNTER — Encounter: Payer: Self-pay | Admitting: *Deleted

## 2018-06-15 ENCOUNTER — Inpatient Hospital Stay: Payer: Medicare Other

## 2018-06-15 ENCOUNTER — Encounter: Payer: Self-pay | Admitting: Adult Health

## 2018-06-15 ENCOUNTER — Other Ambulatory Visit: Payer: Self-pay

## 2018-06-15 ENCOUNTER — Telehealth: Payer: Self-pay | Admitting: General Practice

## 2018-06-15 ENCOUNTER — Inpatient Hospital Stay (HOSPITAL_BASED_OUTPATIENT_CLINIC_OR_DEPARTMENT_OTHER): Payer: Medicare Other | Admitting: Adult Health

## 2018-06-15 VITALS — BP 133/85 | HR 93 | Temp 98.6°F | Resp 18 | Ht 62.0 in | Wt 182.1 lb

## 2018-06-15 DIAGNOSIS — Z171 Estrogen receptor negative status [ER-]: Secondary | ICD-10-CM

## 2018-06-15 DIAGNOSIS — Z170 Estrogen receptor positive status [ER+]: Secondary | ICD-10-CM

## 2018-06-15 DIAGNOSIS — C50512 Malignant neoplasm of lower-outer quadrant of left female breast: Secondary | ICD-10-CM

## 2018-06-15 DIAGNOSIS — C50412 Malignant neoplasm of upper-outer quadrant of left female breast: Secondary | ICD-10-CM

## 2018-06-15 DIAGNOSIS — Z5111 Encounter for antineoplastic chemotherapy: Secondary | ICD-10-CM | POA: Diagnosis not present

## 2018-06-15 DIAGNOSIS — Z79899 Other long term (current) drug therapy: Secondary | ICD-10-CM

## 2018-06-15 DIAGNOSIS — Z95828 Presence of other vascular implants and grafts: Secondary | ICD-10-CM

## 2018-06-15 LAB — CBC WITH DIFFERENTIAL (CANCER CENTER ONLY)
Abs Immature Granulocytes: 0.03 10*3/uL (ref 0.00–0.07)
Basophils Absolute: 0.1 10*3/uL (ref 0.0–0.1)
Basophils Relative: 1 %
Eosinophils Absolute: 0 10*3/uL (ref 0.0–0.5)
Eosinophils Relative: 1 %
HCT: 32.3 % — ABNORMAL LOW (ref 36.0–46.0)
Hemoglobin: 10 g/dL — ABNORMAL LOW (ref 12.0–15.0)
Immature Granulocytes: 1 %
Lymphocytes Relative: 19 %
Lymphs Abs: 1.1 10*3/uL (ref 0.7–4.0)
MCH: 29.1 pg (ref 26.0–34.0)
MCHC: 31 g/dL (ref 30.0–36.0)
MCV: 93.9 fL (ref 80.0–100.0)
Monocytes Absolute: 0.4 10*3/uL (ref 0.1–1.0)
Monocytes Relative: 7 %
Neutro Abs: 4.1 10*3/uL (ref 1.7–7.7)
Neutrophils Relative %: 71 %
Platelet Count: 188 10*3/uL (ref 150–400)
RBC: 3.44 MIL/uL — ABNORMAL LOW (ref 3.87–5.11)
RDW: 16.6 % — ABNORMAL HIGH (ref 11.5–15.5)
WBC Count: 5.6 10*3/uL (ref 4.0–10.5)
nRBC: 0 % (ref 0.0–0.2)

## 2018-06-15 LAB — CMP (CANCER CENTER ONLY)
ALT: 25 U/L (ref 0–44)
AST: 15 U/L (ref 15–41)
Albumin: 3.8 g/dL (ref 3.5–5.0)
Alkaline Phosphatase: 93 U/L (ref 38–126)
Anion gap: 10 (ref 5–15)
BUN: 15 mg/dL (ref 8–23)
CO2: 27 mmol/L (ref 22–32)
Calcium: 9.6 mg/dL (ref 8.9–10.3)
Chloride: 105 mmol/L (ref 98–111)
Creatinine: 0.68 mg/dL (ref 0.44–1.00)
GFR, Est AFR Am: >60 mL/min (ref >60)
GFR, Estimated: >60 mL/min (ref >60)
Glucose, Bld: 103 mg/dL — ABNORMAL HIGH (ref 70–99)
Potassium: 3.4 mmol/L — ABNORMAL LOW (ref 3.5–5.1)
Sodium: 142 mmol/L (ref 135–145)
Total Bilirubin: 0.3 mg/dL (ref 0.3–1.2)
Total Protein: 7.1 g/dL (ref 6.5–8.1)

## 2018-06-15 MED ORDER — PALONOSETRON HCL INJECTION 0.25 MG/5ML
0.2500 mg | Freq: Once | INTRAVENOUS | Status: AC
  Administered 2018-06-15: 0.25 mg via INTRAVENOUS

## 2018-06-15 MED ORDER — PALONOSETRON HCL INJECTION 0.25 MG/5ML
INTRAVENOUS | Status: AC
  Filled 2018-06-15: qty 5

## 2018-06-15 MED ORDER — DIPHENHYDRAMINE HCL 50 MG/ML IJ SOLN
INTRAMUSCULAR | Status: AC
  Filled 2018-06-15: qty 1

## 2018-06-15 MED ORDER — SODIUM CHLORIDE 0.9 % IV SOLN
Freq: Once | INTRAVENOUS | Status: AC
  Administered 2018-06-15: 11:00:00 via INTRAVENOUS
  Filled 2018-06-15: qty 250

## 2018-06-15 MED ORDER — DEXAMETHASONE SODIUM PHOSPHATE 10 MG/ML IJ SOLN
INTRAMUSCULAR | Status: AC
  Filled 2018-06-15: qty 1

## 2018-06-15 MED ORDER — SODIUM CHLORIDE 0.9% FLUSH
10.0000 mL | Freq: Once | INTRAVENOUS | Status: AC
  Administered 2018-06-15: 10 mL
  Filled 2018-06-15: qty 10

## 2018-06-15 MED ORDER — SODIUM CHLORIDE 0.9 % IV SOLN
20.0000 mg | Freq: Once | INTRAVENOUS | Status: AC
  Administered 2018-06-15: 20 mg via INTRAVENOUS
  Filled 2018-06-15: qty 2

## 2018-06-15 MED ORDER — SODIUM CHLORIDE 0.9% FLUSH
10.0000 mL | INTRAVENOUS | Status: DC | PRN
  Administered 2018-06-15: 10 mL
  Filled 2018-06-15: qty 10

## 2018-06-15 MED ORDER — SODIUM CHLORIDE 0.9 % IV SOLN
187.2000 mg | Freq: Once | INTRAVENOUS | Status: AC
  Administered 2018-06-15: 190 mg via INTRAVENOUS
  Filled 2018-06-15: qty 19

## 2018-06-15 MED ORDER — HEPARIN SOD (PORK) LOCK FLUSH 100 UNIT/ML IV SOLN
500.0000 [IU] | Freq: Once | INTRAVENOUS | Status: AC | PRN
  Administered 2018-06-15: 500 [IU]
  Filled 2018-06-15: qty 5

## 2018-06-15 MED ORDER — DEXAMETHASONE SODIUM PHOSPHATE 10 MG/ML IJ SOLN
4.0000 mg | Freq: Once | INTRAMUSCULAR | Status: AC
  Administered 2018-06-15: 4 mg via INTRAVENOUS

## 2018-06-15 MED ORDER — DIPHENHYDRAMINE HCL 50 MG/ML IJ SOLN
25.0000 mg | Freq: Once | INTRAMUSCULAR | Status: AC
  Administered 2018-06-15: 25 mg via INTRAVENOUS

## 2018-06-15 MED ORDER — SODIUM CHLORIDE 0.9 % IV SOLN
80.0000 mg/m2 | Freq: Once | INTRAVENOUS | Status: AC
  Administered 2018-06-15: 150 mg via INTRAVENOUS
  Filled 2018-06-15: qty 25

## 2018-06-15 NOTE — Telephone Encounter (Signed)
[facility] CSW Progress Note  Emailed patient list of financial aid resources (previously had mailed these, but they have not arrived).  Talked w patient, encouraged her to go online to review criteria and recontact CSW for help w appropriate options.  Edwyna Shell, LCSW Clinical Social Worker Phone:  [phone]

## 2018-06-15 NOTE — Progress Notes (Signed)
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 She underwent bilateral diagnostic mammography with tomography and left breast ultrasonography at [facility] on 02/16/2018 showing: breast density category C; two left breast masses, one at 2 o'clock and the other at 3:30 o'clock. The 2 o'clock mass (1.3 x 1 x 1 cm) corresponds to the mammographic abnormality and is highly suspicious for breast carcinoma. The other mass (1.2 x 0.7 x 1.2 cm)  is suspicious for breast carcinoma. No left axillary adenopathy.   Accordingly on 02/19/2018 she proceeded to biopsy of the left breast area in question. 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 Prognostic indicators significant for: estrogen receptor, 90% positive and progesterone receptor, 95% positive, both with strong staining intensity. Proliferation marker Ki67 at 5%. HER2 negative (1+).   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 Prognostic indicators significant for: estrogen receptor, 10% positive with moderate staining intensity and progesterone receptor, 0%  negative. Proliferation marker Ki67 at 40%. HER2 negative (1+).   The patient's subsequent history is as detailed below.   INTERVAL HISTORY: Emily Chambers returns today for follow-up and treatment of her estrogen positive and estrogen negative breast cancer.   She continues on paclitaxel and carboplatin weekly x12. Today is day 1 cycle 6.  She is tolerating these well.  She has no signs of peripheral neuropathy.    REVIEW OF SYSTEMS: Emily Chambers continues to do well.  She continues to isolate at home and avoid contact with others.  She is feeling well.  She is as active as she can be, considering the stay at home orders.  Emily Chambers denies any unusual headaches or vision changes.  She is without mucositis, dysphagia, or appetite changes.  She hasn't noted nausea, vomiting, bowel/bladder changes.  She does continue to take a stool softener to stay regular.  Emily Chambers denies cough, chest pain, palpitations, or shortness of breath.  A detailed ROS was conducted and was otherwise non contributory.    PAST MEDICAL HISTORY: Past Medical History:  Diagnosis Date  . Family history of breast cancer   . Family history of lung cancer   . Family history of throat cancer   . High cholesterol   . Hypertension   . Kidney stone   . Kidney stones   . UTI (lower urinary tract infection)     PAST SURGICAL HISTORY: Past Surgical History:  Procedure Laterality Date  . BREAST EXCISIONAL BIOPSY Left 1999,2005   cysts removed  . BREAST SURGERY Left    cyst and biopsy  . PORTACATH PLACEMENT Left 03/10/2018   Procedure: INSERTION PORT-A-CATH;  Surgeon: Stark Klein, MD;  Location: [facility];  Service: General;  Laterality: Left;    FAMILY HISTORY: Family History  Problem Relation Age of Onset  .  Hypertension Mother   . Heart disease Mother   . Dementia Mother   . Healthy Father   . Colon cancer Maternal Grandmother   . Cancer Paternal Grandmother        unk type  . Lung cancer Cousin        95s  .  Breast cancer Cousin        82s  . Breast cancer Cousin        23s  . Breast cancer Cousin   . Cancer Paternal Aunt        unk type d. 63, possibly pancreatic  . Cancer Paternal Aunt        unk type d. 51s  . Cancer Paternal Uncle        unk type   . Cancer Paternal Uncle        unk type d. 86s  . Cancer Maternal Aunt        unk type, d. 58s  . Throat cancer Maternal Uncle        d. 73s  . Ovarian cancer Neg Hx    Patient father is alive at 103 years old. Patient mother died from heart disease and dementia at age 5.  The patient denies a family hx of ovarian cancer. She has 3 siblings, 1 brother and 2 sisters. She has a maternal cousin diagnosed with lung cancer in her 46s. She has 3 paternal cousins with breast cancer, one was diagnosed in her 64s and has passed away.   GYNECOLOGIC HISTORY:  No LMP recorded. Patient is postmenopausal. Menarche: 68 years old Age at first live birth: n/a GX P 0 LMP 2010 Contraceptive n/a HRT no  Hysterectomy? no BSo? no   SOCIAL HISTORY: She is single and works as a Scientist, water quality at [employer]). She lives alone, with no pets.    ADVANCED DIRECTIVES: Not in place.  At the 03/03/2018 visit she was given the appropriate documents to complete and notarize at her discretion.  She is planning to name her niece, Emily Chambers, as her HCPOA.   HEALTH MAINTENANCE: Social History   Tobacco Use  . Smoking status: Never Smoker  . Smokeless tobacco: Never Used  Substance Use Topics  . Alcohol use: Yes    Comment: seldom  . Drug use: No     Colonoscopy: 2014? [facility]  PAP: 02/02/2018  Bone density: 03/15/2016, T-score 0.4, Dr. Alden Hipp   No Known Allergies  Current Outpatient Medications  Medication Sig Dispense Refill  . amLODipine (NORVASC) 10 MG tablet Take 10 mg by mouth every morning.     Marland Kitchen aspirin 81 MG chewable tablet Chew 81 mg by mouth daily.    Marland Kitchen lidocaine-prilocaine (EMLA) cream Apply 1  application topically as needed. 30 g 0  . LORazepam (ATIVAN) 0.5 MG tablet Take 1 tablet (0.5 mg total) by mouth every 6 (six) hours as needed (Nausea or vomiting). 30 tablet 0  . losartan (COZAAR) 100 MG tablet Take 100 mg by mouth every morning.     Marland Kitchen omeprazole (PRILOSEC) 40 MG capsule Take 1 capsule (40 mg total) by mouth daily. 30 capsule 5  . oxyCODONE (OXY IR/ROXICODONE) 5 MG immediate release tablet Take 1 tablet (5 mg total) by mouth every 6 (six) hours as needed for severe pain. 10 tablet 0  . potassium chloride SA (K-DUR,KLOR-CON) 20 MEQ tablet Take 1 tablet (20 mEq total) by mouth daily. 5 tablet 0  . pravastatin (PRAVACHOL) 20 MG tablet  Take 20 mg by mouth every morning.     . prochlorperazine (COMPAZINE) 10 MG tablet Take 1 tablet (10 mg total) by mouth every 6 (six) hours as needed (Nausea or vomiting). 30 tablet 1   No current facility-administered medications for this visit.     OBJECTIVE:  Vitals:   06/15/18 1036  BP: 133/85  Pulse: 93  Resp: 18  Temp: 98.6 F (37 C)  SpO2: 100%     Body mass index is 33.31 kg/m.   Wt Readings from Last 3 Encounters:  06/15/18 182 lb 1.6 oz (82.6 kg)  06/01/18 182 lb 4.8 oz (82.7 kg)  05/18/18 180 lb 3.2 oz (81.7 kg)  ECOG FS:1 GENERAL: Patient is a well appearing female in no acute distress HEENT:  Sclerae anicteric.  Oropharynx clear and moist. No ulcerations or evidence of oropharyngeal candidiasis. Neck is supple.  NODES:  No cervical, supraclavicular, or axillary lymphadenopathy palpated.  BREAST EXAM:  Unable to palpate left breast mass LUNGS:  Clear to auscultation bilaterally.  No wheezes or rhonchi. HEART:  Regular rate and rhythm. No murmur appreciated. ABDOMEN:  Soft, nontender.  Positive, normoactive bowel sounds. No organomegaly palpated. MSK:  No focal spinal tenderness to palpation. Full range of motion bilaterally in the upper extremities. EXTREMITIES:  No peripheral edema.   SKIN:  Clear with no obvious rashes  or skin changes. + nail discoloration at the base of her nail beds. NEURO:  Nonfocal. Well oriented.  Appropriate affect.     LAB RESULTS:  CMP     Component Value Date/Time   NA 143 06/08/2018 1153   K 3.4 (L) 06/08/2018 1153   CL 106 06/08/2018 1153   CO2 24 06/08/2018 1153   GLUCOSE 132 (H) 06/08/2018 1153   BUN 17 06/08/2018 1153   CREATININE 0.76 06/08/2018 1153   CALCIUM 10.3 06/08/2018 1153   PROT 7.1 06/08/2018 1153   ALBUMIN 3.8 06/08/2018 1153   AST 17 06/08/2018 1153   ALT 25 06/08/2018 1153   ALKPHOS 112 06/08/2018 1153   BILITOT <0.2 (L) 06/08/2018 1153   GFRNONAA >60 06/08/2018 1153   GFRAA >60 06/08/2018 1153    No results found for: TOTALPROTELP, ALBUMINELP, A1GS, A2GS, BETS, BETA2SER, GAMS, MSPIKE, SPEI  No results found for: KPAFRELGTCHN, LAMBDASER, KAPLAMBRATIO  Lab Results  Component Value Date   WBC 5.6 06/15/2018   NEUTROABS 4.1 06/15/2018   HGB 10.0 (L) 06/15/2018   HCT 32.3 (L) 06/15/2018   MCV 93.9 06/15/2018   PLT 188 06/15/2018    _0 @  No results found for: LABCA2  No components found for: ZCHYIF027  No results for input(s): INR in the last 168 hours.  No results found for: LABCA2  No results found for: XAJ287  No results found for: OMV672  No results found for: CNO709  No results found for: CA2729  No components found for: HGQUANT  No results found for: CEA1 / No results found for: CEA1   No results found for: AFPTUMOR  No results found for: CHROMOGRNA  No results found for: PSA1  Appointment on 06/15/2018  Component Date Value Ref Range Status  . WBC Count 06/15/2018 5.6  4.0 - 10.5 K/uL Final  . RBC 06/15/2018 3.44* 3.87 - 5.11 MIL/uL Final  . Hemoglobin 06/15/2018 10.0* 12.0 - 15.0 g/dL Final  . HCT 06/15/2018 32.3* 36.0 - 46.0 % Final  . MCV 06/15/2018 93.9  80.0 - 100.0 fL Final  . MCH 06/15/2018 29.1  26.0 - 34.0 pg  Final  . MCHC 06/15/2018 31.0  30.0 - 36.0 g/dL Final  . RDW 06/15/2018 16.6*  11.5 - 15.5 % Final  . Platelet Count 06/15/2018 188  150 - 400 K/uL Final  . nRBC 06/15/2018 0.0  0.0 - 0.2 % Final  . Neutrophils Relative % 06/15/2018 71  % Final  . Neutro Abs 06/15/2018 4.1  1.7 - 7.7 K/uL Final  . Lymphocytes Relative 06/15/2018 19  % Final  . Lymphs Abs 06/15/2018 1.1  0.7 - 4.0 K/uL Final  . Monocytes Relative 06/15/2018 7  % Final  . Monocytes Absolute 06/15/2018 0.4  0.1 - 1.0 K/uL Final  . Eosinophils Relative 06/15/2018 1  % Final  . Eosinophils Absolute 06/15/2018 0.0  0.0 - 0.5 K/uL Final  . Basophils Relative 06/15/2018 1  % Final  . Basophils Absolute 06/15/2018 0.1  0.0 - 0.1 K/uL Final  . Immature Granulocytes 06/15/2018 1  % Final  . Abs Immature Granulocytes 06/15/2018 0.03  0.00 - 0.07 K/uL Final   Performed at [facility], [address] [place], [place] [address]    (this displays the last labs from the last 3 days)  No results found for: TOTALPROTELP, ALBUMINELP, A1GS, A2GS, BETS, BETA2SER, GAMS, MSPIKE, SPEI (this displays SPEP labs)  No results found for: KPAFRELGTCHN, LAMBDASER, KAPLAMBRATIO (kappa/lambda light chains)  No results found for: HGBA, HGBA2QUANT, HGBFQUANT, HGBSQUAN (Hemoglobinopathy evaluation)   No results found for: LDH  No results found for: IRON, TIBC, IRONPCTSAT (Iron and TIBC)  No results found for: FERRITIN  Urinalysis    Component Value Date/Time   COLORURINE YELLOW 04/12/2018 1504   APPEARANCEUR CLEAR 04/12/2018 1504   LABSPEC 1.020 04/12/2018 1504   PHURINE 5.0 04/12/2018 1504   GLUCOSEU NEGATIVE 04/12/2018 1504   HGBUR NEGATIVE 04/12/2018 1504   BILIRUBINUR NEGATIVE 04/12/2018 1504   KETONESUR NEGATIVE 04/12/2018 1504   PROTEINUR NEGATIVE 04/12/2018 1504   UROBILINOGEN 0.2 07/07/2012 1159   NITRITE NEGATIVE 04/12/2018 1504   LEUKOCYTESUR TRACE (A) 04/12/2018 1504     STUDIES: No results found.   ELIGIBLE FOR AVAILABLE RESEARCH PROTOCOL: Upbeat   ASSESSMENT: 68  y.o. 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, grade 2, estrogen and progesterone receptor positive, HER-2 not amplified, with an MIB-1 of 5%  (b) in the lower outer quadrant a clinical T1c N0, stage IA-B invasive ductal carcinoma, grade 2, estrogen receptor only moderately positive at 10%, progesterone receptor negative, with an MIB-1 of 40%, and HER-2 not amplified  (1) neoadjuvant chemotherapy will consist of doxorubicin and cyclophosphamide in dose dense fashion x4 starting 03/16/2018, followed by paclitaxel and carboplatin weekly x12  (a) echo on 03/11/2018 shows well preserved EF of 60-65%  (2) definitive surgery to follow  (3) adjuvant radiation as appropriate  (4) antiestrogens to follow at the completion of local treatment (for upper outer quadrant tumor)  (5) genetics testing on 03/24/2018 showed no pathogenic mutations.  Genes tested include:  APC, ATM, AXIN2, BARD1, BMPR1A, BRCA1, BRCA2, BRIP1, CDH1, CDKN2A (p14ARF), CDKN2A (p16INK4a), CKD4, CHEK2, CTNNA1, DICER1, EPCAM (Deletion/duplication testing only), GREM1 (promoter region deletion/duplication testing only), KIT, MEN1, MLH1, MSH2, MSH3, MSH6, MUTYH, NBN, NF1, NHTL1, PALB2, PDGFRA, PMS2, POLD1, POLE, PTEN, RAD50, RAD51C, RAD51D, SDHB, SDHC, SDHD, SMAD4, SMARCA4. STK11, TP53, TSC1, TSC2, and VHL.  The following genes were evaluated for sequence changes only: SDHA and HOXB13 c.251G>A variant  only.   PLAN: Emily Chambers is doing well today.  Her CBC is stable and she conitnues to tolerate her neoadjuvant chemotherapy with Paclitaxel and Carboplatin quite well.  She will proceed with her sixth cycle of this today and will continue this weekly for a total of 12 treatments.  We of course are monitoring her closely for complications, and she has not developed any at this point, particularly peripheral neuropathy.     We reviewed COVID19 recommendations and she is following these.    She will receive the Paclitaxel and Carboplatin weekly and we will see her with every other cycle at this point.  We reviewed her overall treatment plan today and she understands it.  She knows to call for any other issues that may develop before the next visit here.  A total of (20) minutes of face-to-face time was spent with this patient with greater than 50% of that time in counseling and care-coordination.   Wilber Bihari, NP  06/15/18 11:03 AM Medical Oncology and Hematology [facility] [address] [place], [place] [address] Tel. [phone]    Fax. [phone]

## 2018-06-15 NOTE — Patient Instructions (Signed)
Coronavirus (COVID-19) Are you at risk?  Are you at risk for the Coronavirus (COVID-19)?  To be considered HIGH RISK for Coronavirus (COVID-19), you have to meet the following criteria:  . Traveled to [place], [place], [place], [place] or [place]; or in the [place] to [place], [place], [place], or [place]; and have fever, cough, and shortness of breath within the last 2 weeks of travel OR . Been in close contact with a person diagnosed with COVID-19 within the last 2 weeks and have fever, cough, and shortness of breath . IF YOU DO NOT MEET THESE CRITERIA, YOU ARE CONSIDERED LOW RISK FOR COVID-19.  What to do if you are HIGH RISK for COVID-19?  . If you are having a medical emergency, call 911. . Seek medical care right away. Before you go to a doctor's office, urgent care or emergency department, call ahead and tell them about your recent travel, contact with someone diagnosed with COVID-19, and your symptoms. You should receive instructions from your physician's office regarding next steps of care.  . When you arrive at healthcare provider, tell the healthcare staff immediately you have returned from visiting [place], [place], [place], [place] or [place]; or traveled in the [place] to [place], [place], [place], or [place]; in the last two weeks or you have been in close contact with a person diagnosed with COVID-19 in the last 2 weeks.   . Tell the health care staff about your symptoms: fever, cough and shortness of breath. . After you have been seen by a medical provider, you will be either: o Tested for (COVID-19) and discharged home on quarantine except to seek medical care if symptoms worsen, and asked to  - Stay home and avoid contact with others until you get your results (4-5 days)  - Avoid travel on public transportation if possible (such as bus, train, or airplane) or o Sent to the Emergency Department by EMS for evaluation, COVID-19 testing, and possible  admission depending on your condition and test results.  What to do if you are LOW RISK for COVID-19?  Reduce your risk of any infection by using the same precautions used for avoiding the common cold or flu:  . Wash your hands often with soap and warm water for at least 20 seconds.  If soap and water are not readily available, use an alcohol-based hand sanitizer with at least 60% alcohol.  . If coughing or sneezing, cover your mouth and nose by coughing or sneezing into the elbow areas of your shirt or coat, into a tissue or into your sleeve (not your hands). . Avoid shaking hands with others and consider head nods or verbal greetings only. . Avoid touching your eyes, nose, or mouth with unwashed hands.  . Avoid close contact with people who are sick. . Avoid places or events with large numbers of people in one location, like concerts or sporting events. . Carefully consider travel plans you have or are making. . If you are planning any travel outside or inside the US, visit the CDC's Travelers' Health webpage for the latest health notices. . If you have some symptoms but not all symptoms, continue to monitor at home and seek medical attention if your symptoms worsen. . If you are having a medical emergency, call 911.   ADDITIONAL HEALTHCARE OPTIONS FOR PATIENTS  [facility] / e-Visit: [email or web address]         [facility]: [phone]  [facility]: [phone]                   [facility]: [phone]   [facility] Discharge Instructions for Patients Receiving Chemotherapy  Today you received the following chemotherapy agents Taxol and Carboplatin   To help prevent nausea and vomiting after your treatment, we encourage you to take your nausea medication as directed.    If you develop nausea and vomiting that is not controlled by your nausea medication, call the clinic.    BELOW ARE SYMPTOMS THAT SHOULD BE REPORTED IMMEDIATELY:  *FEVER GREATER THAN 100.5 F  *CHILLS WITH OR WITHOUT FEVER  NAUSEA AND VOMITING THAT IS NOT CONTROLLED WITH YOUR NAUSEA MEDICATION  *UNUSUAL SHORTNESS OF BREATH  *UNUSUAL BRUISING OR BLEEDING  TENDERNESS IN MOUTH AND THROAT WITH OR WITHOUT PRESENCE OF ULCERS  *URINARY PROBLEMS  *BOWEL PROBLEMS  UNUSUAL RASH Items with * indicate a potential emergency and should be followed up as soon as possible.  Feel free to call the clinic should you have any questions or concerns. The clinic phone number is [phone].  Please show the CHEMO ALERT CARD at check-in to the Emergency Department and triage nurse.   

## 2018-06-21 ENCOUNTER — Telehealth: Payer: Self-pay | Admitting: Oncology

## 2018-06-21 NOTE — Telephone Encounter (Signed)
Changed time and provider of MD 5/5 visit per sch msg.

## 2018-06-22 ENCOUNTER — Other Ambulatory Visit: Payer: Self-pay

## 2018-06-22 ENCOUNTER — Inpatient Hospital Stay: Payer: Medicare Other

## 2018-06-22 VITALS — BP 135/79 | HR 88 | Temp 98.3°F | Resp 18 | Wt 184.0 lb

## 2018-06-22 DIAGNOSIS — C50512 Malignant neoplasm of lower-outer quadrant of left female breast: Secondary | ICD-10-CM

## 2018-06-22 DIAGNOSIS — Z171 Estrogen receptor negative status [ER-]: Principal | ICD-10-CM

## 2018-06-22 DIAGNOSIS — Z5111 Encounter for antineoplastic chemotherapy: Secondary | ICD-10-CM | POA: Diagnosis not present

## 2018-06-22 DIAGNOSIS — Z95828 Presence of other vascular implants and grafts: Secondary | ICD-10-CM

## 2018-06-22 DIAGNOSIS — C50412 Malignant neoplasm of upper-outer quadrant of left female breast: Secondary | ICD-10-CM

## 2018-06-22 DIAGNOSIS — Z170 Estrogen receptor positive status [ER+]: Secondary | ICD-10-CM

## 2018-06-22 LAB — CMP (CANCER CENTER ONLY)
ALT: 27 U/L (ref 0–44)
AST: 14 U/L — ABNORMAL LOW (ref 15–41)
Albumin: 3.7 g/dL (ref 3.5–5.0)
Alkaline Phosphatase: 106 U/L (ref 38–126)
Anion gap: 10 (ref 5–15)
BUN: 15 mg/dL (ref 8–23)
CO2: 26 mmol/L (ref 22–32)
Calcium: 9.6 mg/dL (ref 8.9–10.3)
Chloride: 106 mmol/L (ref 98–111)
Creatinine: 0.83 mg/dL (ref 0.44–1.00)
GFR, Est AFR Am: >60 mL/min (ref >60)
GFR, Estimated: >60 mL/min (ref >60)
Glucose, Bld: 164 mg/dL — ABNORMAL HIGH (ref 70–99)
Potassium: 3.6 mmol/L (ref 3.5–5.1)
Sodium: 142 mmol/L (ref 135–145)
Total Bilirubin: 0.2 mg/dL — ABNORMAL LOW (ref 0.3–1.2)
Total Protein: 6.7 g/dL (ref 6.5–8.1)

## 2018-06-22 LAB — CBC WITH DIFFERENTIAL (CANCER CENTER ONLY)
Abs Immature Granulocytes: 0.01 10*3/uL (ref 0.00–0.07)
Basophils Absolute: 0.1 10*3/uL (ref 0.0–0.1)
Basophils Relative: 2 %
Eosinophils Absolute: 0.1 10*3/uL (ref 0.0–0.5)
Eosinophils Relative: 1 %
HCT: 32.6 % — ABNORMAL LOW (ref 36.0–46.0)
Hemoglobin: 9.9 g/dL — ABNORMAL LOW (ref 12.0–15.0)
Immature Granulocytes: 0 %
Lymphocytes Relative: 22 %
Lymphs Abs: 1.2 10*3/uL (ref 0.7–4.0)
MCH: 28.9 pg (ref 26.0–34.0)
MCHC: 30.4 g/dL (ref 30.0–36.0)
MCV: 95 fL (ref 80.0–100.0)
Monocytes Absolute: 0.3 10*3/uL (ref 0.1–1.0)
Monocytes Relative: 6 %
Neutro Abs: 3.6 10*3/uL (ref 1.7–7.7)
Neutrophils Relative %: 69 %
Platelet Count: 199 10*3/uL (ref 150–400)
RBC: 3.43 MIL/uL — ABNORMAL LOW (ref 3.87–5.11)
RDW: 15.8 % — ABNORMAL HIGH (ref 11.5–15.5)
WBC Count: 5.2 10*3/uL (ref 4.0–10.5)
nRBC: 0 % (ref 0.0–0.2)

## 2018-06-22 MED ORDER — SODIUM CHLORIDE 0.9 % IV SOLN
Freq: Once | INTRAVENOUS | Status: AC
  Administered 2018-06-22: 14:00:00 via INTRAVENOUS
  Filled 2018-06-22: qty 250

## 2018-06-22 MED ORDER — FAMOTIDINE IN NACL 20-0.9 MG/50ML-% IV SOLN
20.0000 mg | Freq: Once | INTRAVENOUS | Status: AC
  Administered 2018-06-22: 14:00:00 20 mg via INTRAVENOUS

## 2018-06-22 MED ORDER — SODIUM CHLORIDE 0.9% FLUSH
10.0000 mL | INTRAVENOUS | Status: DC | PRN
  Administered 2018-06-22: 10 mL
  Filled 2018-06-22: qty 10

## 2018-06-22 MED ORDER — HEPARIN SOD (PORK) LOCK FLUSH 100 UNIT/ML IV SOLN
500.0000 [IU] | Freq: Once | INTRAVENOUS | Status: AC | PRN
  Administered 2018-06-22: 17:00:00 500 [IU]
  Filled 2018-06-22: qty 5

## 2018-06-22 MED ORDER — DIPHENHYDRAMINE HCL 50 MG/ML IJ SOLN
25.0000 mg | Freq: Once | INTRAMUSCULAR | Status: AC
  Administered 2018-06-22: 14:00:00 25 mg via INTRAVENOUS

## 2018-06-22 MED ORDER — SODIUM CHLORIDE 0.9 % IV SOLN
80.0000 mg/m2 | Freq: Once | INTRAVENOUS | Status: AC
  Administered 2018-06-22: 15:00:00 150 mg via INTRAVENOUS
  Filled 2018-06-22: qty 25

## 2018-06-22 MED ORDER — SODIUM CHLORIDE 0.9% FLUSH
10.0000 mL | Freq: Once | INTRAVENOUS | Status: AC
  Administered 2018-06-22: 13:00:00 10 mL
  Filled 2018-06-22: qty 10

## 2018-06-22 MED ORDER — DEXAMETHASONE SODIUM PHOSPHATE 10 MG/ML IJ SOLN
INTRAMUSCULAR | Status: AC
  Filled 2018-06-22: qty 1

## 2018-06-22 MED ORDER — DEXAMETHASONE SODIUM PHOSPHATE 10 MG/ML IJ SOLN
4.0000 mg | Freq: Once | INTRAMUSCULAR | Status: AC
  Administered 2018-06-22: 4 mg via INTRAVENOUS

## 2018-06-22 MED ORDER — PALONOSETRON HCL INJECTION 0.25 MG/5ML
0.2500 mg | Freq: Once | INTRAVENOUS | Status: AC
  Administered 2018-06-22: 14:00:00 0.25 mg via INTRAVENOUS

## 2018-06-22 MED ORDER — DIPHENHYDRAMINE HCL 50 MG/ML IJ SOLN
INTRAMUSCULAR | Status: AC
  Filled 2018-06-22: qty 1

## 2018-06-22 MED ORDER — PALONOSETRON HCL INJECTION 0.25 MG/5ML
INTRAVENOUS | Status: AC
  Filled 2018-06-22: qty 5

## 2018-06-22 MED ORDER — SODIUM CHLORIDE 0.9 % IV SOLN
187.2000 mg | Freq: Once | INTRAVENOUS | Status: AC
  Administered 2018-06-22: 190 mg via INTRAVENOUS
  Filled 2018-06-22: qty 19

## 2018-06-22 MED ORDER — FAMOTIDINE IN NACL 20-0.9 MG/50ML-% IV SOLN
INTRAVENOUS | Status: AC
  Filled 2018-06-22: qty 50

## 2018-06-22 NOTE — Patient Instructions (Signed)
[facility] Discharge Instructions for Patients Receiving Chemotherapy  Today you received the following chemotherapy agents Paclitaxel (TAXOL) & Carboplatin (PARAPLATIN).  To help prevent nausea and vomiting after your treatment, we encourage you to take your nausea medication as prescribed.   If you develop nausea and vomiting that is not controlled by your nausea medication, call the clinic.   BELOW ARE SYMPTOMS THAT SHOULD BE REPORTED IMMEDIATELY:  *FEVER GREATER THAN 100.5 F  *CHILLS WITH OR WITHOUT FEVER  NAUSEA AND VOMITING THAT IS NOT CONTROLLED WITH YOUR NAUSEA MEDICATION  *UNUSUAL SHORTNESS OF BREATH  *UNUSUAL BRUISING OR BLEEDING  TENDERNESS IN MOUTH AND THROAT WITH OR WITHOUT PRESENCE OF ULCERS  *URINARY PROBLEMS  *BOWEL PROBLEMS  UNUSUAL RASH Items with * indicate a potential emergency and should be followed up as soon as possible.  Feel free to call the clinic should you have any questions or concerns. The clinic phone number is [phone].  Please show the CHEMO ALERT CARD at check-in to the Emergency Department and triage nurse.  Coronavirus (COVID-19) Are you at risk?  Are you at risk for the Coronavirus (COVID-19)?  To be considered HIGH RISK for Coronavirus (COVID-19), you have to meet the following criteria:  . Traveled to [place], [place], [place], [place] or [place]; or in the [place] to [place], [place], [place], or [place]; and have fever, cough, and shortness of breath within the last 2 weeks of travel OR . Been in close contact with a person diagnosed with COVID-19 within the last 2 weeks and have fever, cough, and shortness of breath . IF YOU DO NOT MEET THESE CRITERIA, YOU ARE CONSIDERED LOW RISK FOR COVID-19.  What to do if you are HIGH RISK for COVID-19?  . If you are having a medical emergency, call 911. . Seek medical care right away. Before you go to a doctor's office, urgent care or emergency department,  call ahead and tell them about your recent travel, contact with someone diagnosed with COVID-19, and your symptoms. You should receive instructions from your physician's office regarding next steps of care.  . When you arrive at healthcare provider, tell the healthcare staff immediately you have returned from visiting [place], [place], [place], [place] or [place]; or traveled in the [place] to [place], [place], [place], or [place]; in the last two weeks or you have been in close contact with a person diagnosed with COVID-19 in the last 2 weeks.   . Tell the health care staff about your symptoms: fever, cough and shortness of breath. . After you have been seen by a medical provider, you will be either: o Tested for (COVID-19) and discharged home on quarantine except to seek medical care if symptoms worsen, and asked to  - Stay home and avoid contact with others until you get your results (4-5 days)  - Avoid travel on public transportation if possible (such as bus, train, or airplane) or o Sent to the Emergency Department by EMS for evaluation, COVID-19 testing, and possible admission depending on your condition and test results.  What to do if you are LOW RISK for COVID-19?  Reduce your risk of any infection by using the same precautions used for avoiding the common cold or flu:  . Wash your hands often with soap and warm water for at least 20 seconds.  If soap and water are not readily available, use an alcohol-based hand sanitizer with at least 60% alcohol.  .   If coughing or sneezing, cover your mouth and nose by coughing or sneezing into the elbow areas of your shirt or coat, into a tissue or into your sleeve (not your hands). . Avoid shaking hands with others and consider head nods or verbal greetings only. . Avoid touching your eyes, nose, or mouth with unwashed hands.  . Avoid close contact with people who are sick. . Avoid places or events with large numbers of people in one  location, like concerts or sporting events. . Carefully consider travel plans you have or are making. . If you are planning any travel outside or inside the [place], visit the CDC's Travelers' Health webpage for the latest health notices. . If you have some symptoms but not all symptoms, continue to monitor at home and seek medical attention if your symptoms worsen. . If you are having a medical emergency, call 911.   ADDITIONAL HEALTHCARE OPTIONS FOR PATIENTS  [facility] / e-Visit: [email or web address]         [facility]: [phone]  [facility]: [phone]                   [facility]: [phone]   

## 2018-06-22 NOTE — Patient Instructions (Signed)

## 2018-06-29 ENCOUNTER — Inpatient Hospital Stay: Payer: Medicare Other | Attending: Oncology

## 2018-06-29 ENCOUNTER — Encounter: Payer: Self-pay | Admitting: *Deleted

## 2018-06-29 ENCOUNTER — Inpatient Hospital Stay: Payer: Medicare Other

## 2018-06-29 ENCOUNTER — Encounter: Payer: Self-pay | Admitting: Adult Health

## 2018-06-29 ENCOUNTER — Inpatient Hospital Stay (HOSPITAL_BASED_OUTPATIENT_CLINIC_OR_DEPARTMENT_OTHER): Payer: Medicare Other | Admitting: Adult Health

## 2018-06-29 ENCOUNTER — Other Ambulatory Visit: Payer: Self-pay

## 2018-06-29 ENCOUNTER — Ambulatory Visit: Payer: Medicare Other | Admitting: Oncology

## 2018-06-29 VITALS — BP 134/82 | HR 86 | Temp 97.8°F | Resp 17 | Ht 62.0 in | Wt 184.8 lb

## 2018-06-29 DIAGNOSIS — C50512 Malignant neoplasm of lower-outer quadrant of left female breast: Secondary | ICD-10-CM

## 2018-06-29 DIAGNOSIS — Z5111 Encounter for antineoplastic chemotherapy: Secondary | ICD-10-CM | POA: Insufficient documentation

## 2018-06-29 DIAGNOSIS — Z170 Estrogen receptor positive status [ER+]: Secondary | ICD-10-CM

## 2018-06-29 DIAGNOSIS — Z79899 Other long term (current) drug therapy: Secondary | ICD-10-CM | POA: Insufficient documentation

## 2018-06-29 DIAGNOSIS — Z171 Estrogen receptor negative status [ER-]: Principal | ICD-10-CM

## 2018-06-29 DIAGNOSIS — C50412 Malignant neoplasm of upper-outer quadrant of left female breast: Secondary | ICD-10-CM

## 2018-06-29 DIAGNOSIS — Z95828 Presence of other vascular implants and grafts: Secondary | ICD-10-CM

## 2018-06-29 LAB — CBC WITH DIFFERENTIAL (CANCER CENTER ONLY)
Abs Immature Granulocytes: 0.02 10*3/uL (ref 0.00–0.07)
Basophils Absolute: 0 10*3/uL (ref 0.0–0.1)
Basophils Relative: 1 %
Eosinophils Absolute: 0 10*3/uL (ref 0.0–0.5)
Eosinophils Relative: 1 %
HCT: 34.2 % — ABNORMAL LOW (ref 36.0–46.0)
Hemoglobin: 10.3 g/dL — ABNORMAL LOW (ref 12.0–15.0)
Immature Granulocytes: 1 %
Lymphocytes Relative: 27 %
Lymphs Abs: 1.2 10*3/uL (ref 0.7–4.0)
MCH: 28.9 pg (ref 26.0–34.0)
MCHC: 30.1 g/dL (ref 30.0–36.0)
MCV: 96.1 fL (ref 80.0–100.0)
Monocytes Absolute: 0.2 10*3/uL (ref 0.1–1.0)
Monocytes Relative: 5 %
Neutro Abs: 2.9 10*3/uL (ref 1.7–7.7)
Neutrophils Relative %: 65 %
Platelet Count: 201 10*3/uL (ref 150–400)
RBC: 3.56 MIL/uL — ABNORMAL LOW (ref 3.87–5.11)
RDW: 15.4 % (ref 11.5–15.5)
WBC Count: 4.4 10*3/uL (ref 4.0–10.5)
nRBC: 0 % (ref 0.0–0.2)

## 2018-06-29 LAB — CMP (CANCER CENTER ONLY)
ALT: 26 U/L (ref 0–44)
AST: 14 U/L — ABNORMAL LOW (ref 15–41)
Albumin: 3.8 g/dL (ref 3.5–5.0)
Alkaline Phosphatase: 100 U/L (ref 38–126)
Anion gap: 11 (ref 5–15)
BUN: 15 mg/dL (ref 8–23)
CO2: 25 mmol/L (ref 22–32)
Calcium: 9.5 mg/dL (ref 8.9–10.3)
Chloride: 108 mmol/L (ref 98–111)
Creatinine: 0.71 mg/dL (ref 0.44–1.00)
GFR, Est AFR Am: >60 mL/min (ref >60)
GFR, Estimated: >60 mL/min (ref >60)
Glucose, Bld: 159 mg/dL — ABNORMAL HIGH (ref 70–99)
Potassium: 3.2 mmol/L — ABNORMAL LOW (ref 3.5–5.1)
Sodium: 144 mmol/L (ref 135–145)
Total Bilirubin: 0.3 mg/dL (ref 0.3–1.2)
Total Protein: 7 g/dL (ref 6.5–8.1)

## 2018-06-29 MED ORDER — SODIUM CHLORIDE 0.9% FLUSH
10.0000 mL | INTRAVENOUS | Status: DC | PRN
  Administered 2018-06-29: 13:00:00 10 mL
  Filled 2018-06-29: qty 10

## 2018-06-29 MED ORDER — DEXAMETHASONE SODIUM PHOSPHATE 10 MG/ML IJ SOLN
INTRAMUSCULAR | Status: AC
  Filled 2018-06-29: qty 1

## 2018-06-29 MED ORDER — PALONOSETRON HCL INJECTION 0.25 MG/5ML
INTRAVENOUS | Status: AC
  Filled 2018-06-29: qty 5

## 2018-06-29 MED ORDER — PALONOSETRON HCL INJECTION 0.25 MG/5ML
0.2500 mg | Freq: Once | INTRAVENOUS | Status: AC
  Administered 2018-06-29: 0.25 mg via INTRAVENOUS

## 2018-06-29 MED ORDER — SODIUM CHLORIDE 0.9% FLUSH
10.0000 mL | Freq: Once | INTRAVENOUS | Status: AC
  Administered 2018-06-29: 10 mL
  Filled 2018-06-29: qty 10

## 2018-06-29 MED ORDER — DEXAMETHASONE SODIUM PHOSPHATE 10 MG/ML IJ SOLN
4.0000 mg | Freq: Once | INTRAMUSCULAR | Status: AC
  Administered 2018-06-29: 4 mg via INTRAVENOUS

## 2018-06-29 MED ORDER — SODIUM CHLORIDE 0.9 % IV SOLN
187.2000 mg | Freq: Once | INTRAVENOUS | Status: AC
  Administered 2018-06-29: 190 mg via INTRAVENOUS
  Filled 2018-06-29: qty 19

## 2018-06-29 MED ORDER — FAMOTIDINE IN NACL 20-0.9 MG/50ML-% IV SOLN
INTRAVENOUS | Status: AC
  Filled 2018-06-29: qty 50

## 2018-06-29 MED ORDER — DIPHENHYDRAMINE HCL 50 MG/ML IJ SOLN
INTRAMUSCULAR | Status: AC
  Filled 2018-06-29: qty 1

## 2018-06-29 MED ORDER — DIPHENHYDRAMINE HCL 50 MG/ML IJ SOLN
25.0000 mg | Freq: Once | INTRAMUSCULAR | Status: AC
  Administered 2018-06-29: 25 mg via INTRAVENOUS

## 2018-06-29 MED ORDER — SODIUM CHLORIDE 0.9 % IV SOLN
80.0000 mg/m2 | Freq: Once | INTRAVENOUS | Status: AC
  Administered 2018-06-29: 11:00:00 150 mg via INTRAVENOUS
  Filled 2018-06-29: qty 25

## 2018-06-29 MED ORDER — SODIUM CHLORIDE 0.9 % IV SOLN
Freq: Once | INTRAVENOUS | Status: AC
  Administered 2018-06-29: 10:00:00 via INTRAVENOUS
  Filled 2018-06-29: qty 250

## 2018-06-29 MED ORDER — HEPARIN SOD (PORK) LOCK FLUSH 100 UNIT/ML IV SOLN
500.0000 [IU] | Freq: Once | INTRAVENOUS | Status: AC | PRN
  Administered 2018-06-29: 13:00:00 500 [IU]
  Filled 2018-06-29: qty 5

## 2018-06-29 MED ORDER — FAMOTIDINE IN NACL 20-0.9 MG/50ML-% IV SOLN
20.0000 mg | Freq: Once | INTRAVENOUS | Status: AC
  Administered 2018-06-29: 20 mg via INTRAVENOUS

## 2018-06-29 NOTE — Patient Instructions (Signed)
[facility] Discharge Instructions for Patients Receiving Chemotherapy  Today you received the following chemotherapy agents Paclitaxel (TAXOL) & Carboplatin (PARAPLATIN).  To help prevent nausea and vomiting after your treatment, we encourage you to take your nausea medication as prescribed.   If you develop nausea and vomiting that is not controlled by your nausea medication, call the clinic.   BELOW ARE SYMPTOMS THAT SHOULD BE REPORTED IMMEDIATELY:  *FEVER GREATER THAN 100.5 F  *CHILLS WITH OR WITHOUT FEVER  NAUSEA AND VOMITING THAT IS NOT CONTROLLED WITH YOUR NAUSEA MEDICATION  *UNUSUAL SHORTNESS OF BREATH  *UNUSUAL BRUISING OR BLEEDING  TENDERNESS IN MOUTH AND THROAT WITH OR WITHOUT PRESENCE OF ULCERS  *URINARY PROBLEMS  *BOWEL PROBLEMS  UNUSUAL RASH Items with * indicate a potential emergency and should be followed up as soon as possible.  Feel free to call the clinic should you have any questions or concerns. The clinic phone number is [phone].  Please show the [facility] at check-in to the Emergency Department and triage nurse.  Coronavirus (COVID-19) Are you at risk?  Are you at risk for the Coronavirus (COVID-19)?  To be considered HIGH RISK for Coronavirus (COVID-19), you have to meet the following criteria:   Traveled to [place], [place], [place], [place] or [place]; or in the [place] to [place], [place], [place], or [place]; and have fever, cough, and shortness of breath within the last 2 weeks of travel OR  Been in close contact with a person diagnosed with COVID-19 within the last 2 weeks and have fever, cough, and shortness of breath  IF YOU DO NOT MEET THESE CRITERIA, YOU ARE CONSIDERED LOW RISK FOR COVID-19.  What to do if you are HIGH RISK for COVID-19?   If you are having a medical emergency, call 911.  Seek medical care right away. Before you go to a doctors office, urgent care or emergency department,  call ahead and tell them about your recent travel, contact with someone diagnosed with COVID-19, and your symptoms. You should receive instructions from your physicians office regarding next steps of care.   When you arrive at healthcare provider, tell the healthcare staff immediately you have returned from visiting [place], [place], [place], [place] or [place]; or traveled in the [place] to [place], [place], [place], or [place]; in the last two weeks or you have been in close contact with a person diagnosed with COVID-19 in the last 2 weeks.    Tell the health care staff about your symptoms: fever, cough and shortness of breath.  After you have been seen by a medical provider, you will be either: o Tested for (COVID-19) and discharged home on quarantine except to seek medical care if symptoms worsen, and asked to  - Stay home and avoid contact with others until you get your results (4-5 days)  - Avoid travel on public transportation if possible (such as bus, train, or airplane) or o Sent to the Emergency Department by EMS for evaluation, COVID-19 testing, and possible admission depending on your condition and test results.  What to do if you are LOW RISK for COVID-19?  Reduce your risk of any infection by using the same precautions used for avoiding the common cold or flu:   Wash your hands often with soap and warm water for at least 20 seconds.  If soap and water are not readily available, use an alcohol-based hand sanitizer with at least 60% alcohol.  If coughing or sneezing, cover your mouth and nose by coughing or sneezing into the elbow areas of your shirt or coat, into a tissue or into your sleeve (not your hands).  Avoid shaking hands with others and consider head nods or verbal greetings only.  Avoid touching your eyes, nose, or mouth with unwashed hands.   Avoid close contact with people who are sick.  Avoid places or events with large numbers of people in one  location, like concerts or sporting events.  Carefully consider travel plans you have or are making.  If you are planning any travel outside or inside the [place], visit the [facility] webpage for the latest health notices.  If you have some symptoms but not all symptoms, continue to monitor at home and seek medical attention if your symptoms worsen.  If you are having a medical emergency, call 911.   [facility] / e-Visit: [email or web address]         [facility]: [phone]  [facility]: [phone]                   [facility]: [phone]     Hypokalemia Hypokalemia means that the amount of potassium in the blood is lower than normal.Potassium is a chemical that helps regulate the amount of fluid in the body (electrolyte). It also stimulates muscle tightening (contraction) and helps nerves work properly.Normally, most of the bodys potassium is inside of cells, and only a very small amount is in the blood. Because the amount in the blood is so small, minor changes to potassium levels in the blood can be life-threatening. What are the causes? This condition may be caused by:  Antibiotic medicine.  Diarrhea or vomiting. Taking too much of a medicine that helps you have a bowel movement (laxative) can cause diarrhea and lead to hypokalemia.  Chronic kidney disease (CKD).  Medicines that help the body get rid of excess fluid (diuretics).  Eating disorders, such as bulimia.  Low magnesium levels in the body.  Sweating a lot. What are the signs or symptoms? Symptoms of this condition include:  Weakness.  Constipation.  Fatigue.  Muscle cramps.  Mental confusion.  Skipped heartbeats or irregular heartbeat (palpitations).  Tingling or numbness. How is this diagnosed? This condition is diagnosed with a blood  test. How is this treated? Hypokalemia can be treated by taking potassium supplements by mouth or adjusting the medicines that you take. Treatment may also include eating more foods that contain a lot of potassium. If your potassium level is very low, you may need to get potassium through an IV tube in one of your veins and be monitored in the hospital. Follow these instructions at home:   Take over-the-counter and prescription medicines only as told by your health care provider. This includes vitamins and supplements.  Eat a healthy diet. A healthy diet includes fresh fruits and vegetables, whole grains, healthy fats, and lean proteins.  If instructed, eat more foods that contain a lot of potassium, such as: ? Nuts, such as peanuts and pistachios. ? Seeds, such as sunflower seeds and pumpkin seeds. ? Peas, lentils, and lima beans. ? Whole grain and bran cereals and breads. ? Fresh fruits and vegetables, such as apricots, avocado, bananas, cantaloupe, kiwi, oranges, tomatoes, asparagus, and potatoes. ? Orange juice. ? Tomato juice. ? Red meats. ? Yogurt.  Keep all follow-up visits as told by your health care  provider. This is important. Contact a health care provider if:  You have weakness that gets worse.  You feel your heart pounding or racing.  You vomit.  You have diarrhea.  You have diabetes (diabetes mellitus) and you have trouble keeping your blood sugar (glucose) in your target range. Get help right away if:  You have chest pain.  You have shortness of breath.  You have vomiting or diarrhea that lasts for more than 2 days.  You faint. This information is not intended to replace advice given to you by your health care provider. Make sure you discuss any questions you have with your health care provider. Document Released: 02/10/2005 Document Revised: 09/29/2015 Document Reviewed: 09/29/2015 Elsevier Interactive Patient Education  2019 [employer].

## 2018-06-29 NOTE — Progress Notes (Signed)
Per Wilber Bihari, NP encourage K+ rich foods due to K+ 3.2.  Nurse provided education, and handouts.

## 2018-06-29 NOTE — Progress Notes (Signed)
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 She underwent bilateral diagnostic mammography with tomography and left breast ultrasonography at [facility] on 02/16/2018 showing: breast density category C; two left breast masses, one at 2 o'clock and the other at 3:30 o'clock. The 2 o'clock mass (1.3 x 1 x 1 cm) corresponds to the mammographic abnormality and is highly suspicious for breast carcinoma. The other mass (1.2 x 0.7 x 1.2 cm)  is suspicious for breast carcinoma. No left axillary adenopathy.   Accordingly on 02/19/2018 she proceeded to biopsy of the left breast area in question. 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 Prognostic indicators significant for: estrogen receptor, 90% positive and progesterone receptor, 95% positive, both with strong staining intensity. Proliferation marker Ki67 at 5%. HER2 negative (1+).   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 Prognostic indicators significant for: estrogen receptor, 10% positive with moderate staining intensity and progesterone receptor, 0%  negative. Proliferation marker Ki67 at 40%. HER2 negative (1+).   The patient's subsequent history is as detailed below.   INTERVAL HISTORY: Emily Chambers returns today for follow-up and treatment of her estrogen positive and estrogen negative breast cancer.   She continues on paclitaxel and carboplatin weekly x12. Today is day 1 cycle 8.  She is tolerating these well.  She has no signs of peripheral neuropathy.    REVIEW OF SYSTEMS: Emily Chambers is feeling well today.  She says sometimes her legs are slightly shaky, but she denies any other issues.  She is walking around and is working on getting back into walking more frequently.  She is continuing social distancing.  Shanielle denies any fever or chills.  She hasn't had any vision changes, unusual headaches, mucositis, dysphagia, nausea, vomiting, or bowel/bladder changes.  She denies chest pain, cough, shortness of breath, palpitations.  A detailed ROS was otherwise non contributory.    PAST MEDICAL HISTORY: Past Medical History:  Diagnosis Date   Family history of breast cancer    Family history of lung cancer    Family history of throat cancer    High cholesterol    Hypertension    Kidney stone    Kidney stones    UTI (lower urinary tract infection)     PAST SURGICAL HISTORY: Past Surgical History:  Procedure Laterality Date   BREAST EXCISIONAL BIOPSY Left [id number]   cysts removed   BREAST SURGERY Left    cyst and biopsy   PORTACATH PLACEMENT Left 03/10/2018   Procedure: INSERTION PORT-A-CATH;  Surgeon: Stark Klein, MD;  Location: [facility];  Service: General;  Laterality: Left;    FAMILY HISTORY: Family History  Problem Relation Age of Onset   Hypertension Mother    Heart disease Mother    Dementia Mother  Healthy Father    Colon cancer Maternal Grandmother    Cancer Paternal Grandmother        unk type   Lung cancer Cousin        9s   Breast cancer Cousin        81s   Breast cancer  Cousin        69s   Breast cancer Cousin    Cancer Paternal Aunt        unk type d. 30, possibly pancreatic   Cancer Paternal Aunt        unk type d. 39s   Cancer Paternal Uncle        unk type    Cancer Paternal Uncle        unk type d. 52s   Cancer Maternal Aunt        unk type, d. 37s   Throat cancer Maternal Uncle        d. 1s   Ovarian cancer Neg Hx    Patient father is alive at 8 years old. Patient mother died from heart disease and dementia at age 71.  The patient denies a family hx of ovarian cancer. She has 3 siblings, 1 brother and 2 sisters. She has a maternal cousin diagnosed with lung cancer in her 97s. She has 3 paternal cousins with breast cancer, one was diagnosed in her 62s and has passed away.   GYNECOLOGIC HISTORY:  No LMP recorded. Patient is postmenopausal. Menarche: 68 years old Age at first live birth: n/a GX P 0 LMP 2010 Contraceptive n/a HRT no  Hysterectomy? no BSo? no   SOCIAL HISTORY: She is single and works as a Scientist, water quality at [employer]). She lives alone, with no pets.    ADVANCED DIRECTIVES: Not in place.  At the 03/03/2018 visit she was given the appropriate documents to complete and notarize at her discretion.  She is planning to name her niece, [name], as her HCPOA.   HEALTH MAINTENANCE: Social History   Tobacco Use   Smoking status: Never Smoker   Smokeless tobacco: Never Used  Substance Use Topics   Alcohol use: Yes    Comment: seldom   Drug use: No     Colonoscopy: 2014? [facility]  PAP: 02/02/2018  Bone density: 03/15/2016, T-score 0.4, Dr. Alden Hipp   No Known Allergies  Current Outpatient Medications  Medication Sig Dispense Refill   amLODipine (NORVASC) 10 MG tablet Take 10 mg by mouth every morning.      aspirin 81 MG chewable tablet Chew 81 mg by mouth daily.     lidocaine-prilocaine (EMLA) cream Apply 1 application topically as needed. 30 g 0   LORazepam  (ATIVAN) 0.5 MG tablet Take 1 tablet (0.5 mg total) by mouth every 6 (six) hours as needed (Nausea or vomiting). 30 tablet 0   losartan (COZAAR) 100 MG tablet Take 100 mg by mouth every morning.      omeprazole (PRILOSEC) 40 MG capsule Take 1 capsule (40 mg total) by mouth daily. 30 capsule 5   oxyCODONE (OXY IR/ROXICODONE) 5 MG immediate release tablet Take 1 tablet (5 mg total) by mouth every 6 (six) hours as needed for severe pain. 10 tablet 0   potassium chloride SA (K-DUR,KLOR-CON) 20 MEQ tablet Take 1 tablet (20 mEq total) by mouth daily. 5 tablet 0   pravastatin (PRAVACHOL) 20 MG tablet Take 20 mg by mouth every morning.      prochlorperazine (COMPAZINE) 10 MG  tablet Take 1 tablet (10 mg total) by mouth every 6 (six) hours as needed (Nausea or vomiting). 30 tablet 1   No current facility-administered medications for this visit.     OBJECTIVE:  Vitals:   06/29/18 0921  BP: 134/82  Pulse: 86  Resp: 17  Temp: 97.8 F (36.6 C)  SpO2: 100%     Body mass index is 33.8 kg/m.   Wt Readings from Last 3 Encounters:  06/29/18 184 lb 12.8 oz (83.8 kg)  06/22/18 184 lb (83.5 kg)  06/15/18 182 lb 1.6 oz (82.6 kg)  ECOG FS:1 GENERAL: Patient is a well appearing female in no acute distress HEENT:  Sclerae anicteric.  Oropharynx clear and moist. No ulcerations or evidence of oropharyngeal candidiasis. Neck is supple.  NODES:  No cervical, supraclavicular, or axillary lymphadenopathy palpated.  BREAST EXAM:  Unable to palpate left breast mass, bilateral breasts benign LUNGS:  Clear to auscultation bilaterally.  No wheezes or rhonchi. HEART:  Regular rate and rhythm. No murmur appreciated. ABDOMEN:  Soft, nontender.  Positive, normoactive bowel sounds. No organomegaly palpated. MSK:  No focal spinal tenderness to palpation. Full range of motion bilaterally in the upper extremities. EXTREMITIES:  No peripheral edema.   SKIN:  Clear with no obvious rashes or skin changes. + nail  discoloration at the base of her nail beds. NEURO:  Nonfocal. Well oriented.  Appropriate affect.     LAB RESULTS:  CMP     Component Value Date/Time   NA 144 06/29/2018 0842   K 3.2 (L) 06/29/2018 0842   CL 108 06/29/2018 0842   CO2 25 06/29/2018 0842   GLUCOSE 159 (H) 06/29/2018 0842   BUN 15 06/29/2018 0842   CREATININE 0.71 06/29/2018 0842   CALCIUM 9.5 06/29/2018 0842   PROT 7.0 06/29/2018 0842   ALBUMIN 3.8 06/29/2018 0842   AST 14 (L) 06/29/2018 0842   ALT 26 06/29/2018 0842   ALKPHOS 100 06/29/2018 0842   BILITOT 0.3 06/29/2018 0842   GFRNONAA >60 06/29/2018 0842   GFRAA >60 06/29/2018 0842    No results found for: TOTALPROTELP, ALBUMINELP, A1GS, A2GS, BETS, BETA2SER, GAMS, MSPIKE, SPEI  No results found for: KPAFRELGTCHN, LAMBDASER, [facility]  Lab Results  Component Value Date   WBC 4.4 06/29/2018   NEUTROABS 2.9 06/29/2018   HGB 10.3 (L) 06/29/2018   HCT 34.2 (L) 06/29/2018   MCV 96.1 06/29/2018   PLT 201 06/29/2018    _0 @  No results found for: LABCA2  No components found for: [id number]  No results for input(s): INR in the last 168 hours.  No results found for: LABCA2  No results found for: HLK562  No results found for: BWL893  No results found for: TDS287  No results found for: CA2729  No components found for: HGQUANT  No results found for: CEA1 / No results found for: CEA1   No results found for: AFPTUMOR  No results found for: CHROMOGRNA  No results found for: PSA1  Appointment on 06/29/2018  Component Date Value Ref Range Status   WBC Count 06/29/2018 4.4  4.0 - 10.5 K/uL Final   RBC 06/29/2018 3.56* 3.87 - 5.11 MIL/uL Final   Hemoglobin 06/29/2018 10.3* 12.0 - 15.0 g/dL Final   HCT 06/29/2018 34.2* 36.0 - 46.0 % Final   MCV 06/29/2018 96.1  80.0 - 100.0 fL Final   MCH 06/29/2018 28.9  26.0 - 34.0 pg Final   MCHC 06/29/2018 30.1  30.0 - 36.0 g/dL Final   RDW  06/29/2018 15.4  11.5 - 15.5 % Final    Platelet Count 06/29/2018 201  150 - 400 K/uL Final   nRBC 06/29/2018 0.0  0.0 - 0.2 % Final   Neutrophils Relative % 06/29/2018 65  % Final   Neutro Abs 06/29/2018 2.9  1.7 - 7.7 K/uL Final   Lymphocytes Relative 06/29/2018 27  % Final   Lymphs Abs 06/29/2018 1.2  0.7 - 4.0 K/uL Final   Monocytes Relative 06/29/2018 5  % Final   Monocytes Absolute 06/29/2018 0.2  0.1 - 1.0 K/uL Final   Eosinophils Relative 06/29/2018 1  % Final   Eosinophils Absolute 06/29/2018 0.0  0.0 - 0.5 K/uL Final   Basophils Relative 06/29/2018 1  % Final   Basophils Absolute 06/29/2018 0.0  0.0 - 0.1 K/uL Final   Immature Granulocytes 06/29/2018 1  % Final   Abs Immature Granulocytes 06/29/2018 0.02  0.00 - 0.07 K/uL Final   Performed at [facility], [place] [address], [place], [place] [address]   Sodium 06/29/2018 144  135 - 145 mmol/L Final   Potassium 06/29/2018 3.2* 3.5 - 5.1 mmol/L Final   Chloride 06/29/2018 108  98 - 111 mmol/L Final   CO2 06/29/2018 25  22 - 32 mmol/L Final   Glucose, Bld 06/29/2018 159* 70 - 99 mg/dL Final   BUN 06/29/2018 15  8 - 23 mg/dL Final   Creatinine 06/29/2018 0.71  0.44 - 1.00 mg/dL Final   Calcium 06/29/2018 9.5  8.9 - 10.3 mg/dL Final   Total Protein 06/29/2018 7.0  6.5 - 8.1 g/dL Final   Albumin 06/29/2018 3.8  3.5 - 5.0 g/dL Final   AST 06/29/2018 14* 15 - 41 U/L Final   ALT 06/29/2018 26  0 - 44 U/L Final   Alkaline Phosphatase 06/29/2018 100  38 - 126 U/L Final   Total Bilirubin 06/29/2018 0.3  0.3 - 1.2 mg/dL Final   GFR, Est Non Af Am 06/29/2018 >60  >60 mL/min Final   GFR, Est AFR Am 06/29/2018 >60  >60 mL/min Final   Anion gap 06/29/2018 11  5 - 15 Final   Performed at [facility], [address] [place], [place] [address]    (this displays the last labs from the last 3 days)  No results found for: TOTALPROTELP, ALBUMINELP, A1GS, A2GS, BETS, BETA2SER, GAMS, MSPIKE, SPEI (this  displays SPEP labs)  No results found for: KPAFRELGTCHN, LAMBDASER, KAPLAMBRATIO (kappa/lambda light chains)  No results found for: HGBA, HGBA2QUANT, HGBFQUANT, HGBSQUAN (Hemoglobinopathy evaluation)   No results found for: LDH  No results found for: IRON, TIBC, IRONPCTSAT (Iron and TIBC)  No results found for: FERRITIN  Urinalysis    Component Value Date/Time   COLORURINE YELLOW 04/12/2018 1504   APPEARANCEUR CLEAR 04/12/2018 1504   LABSPEC 1.020 04/12/2018 1504   PHURINE 5.0 04/12/2018 1504   GLUCOSEU NEGATIVE 04/12/2018 1504   HGBUR NEGATIVE 04/12/2018 1504   BILIRUBINUR NEGATIVE 04/12/2018 1504   KETONESUR NEGATIVE 04/12/2018 1504   PROTEINUR NEGATIVE 04/12/2018 1504   UROBILINOGEN 0.2 07/07/2012 1159   NITRITE NEGATIVE 04/12/2018 1504   LEUKOCYTESUR TRACE (A) 04/12/2018 1504     STUDIES: No results found.   ELIGIBLE FOR AVAILABLE RESEARCH PROTOCOL: Upbeat   ASSESSMENT: 68 y.o. 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, grade 2, estrogen and progesterone receptor positive, HER-2 not  amplified, with an MIB-1 of 5%  (b) in the lower outer quadrant a clinical T1c N0, stage IA-B invasive ductal carcinoma, grade 2, estrogen receptor only moderately positive at 10%, progesterone receptor negative, with an MIB-1 of 40%, and HER-2 not amplified  (1) neoadjuvant chemotherapy will consist of doxorubicin and cyclophosphamide in dose dense fashion x4 starting 03/16/2018, followed by paclitaxel and carboplatin weekly x12  (a) echo on 03/11/2018 shows well preserved EF of 60-65%  (2) definitive surgery to follow  (3) adjuvant radiation as appropriate  (4) antiestrogens to follow at the completion of local treatment (for upper outer quadrant tumor)  (5) genetics testing on 03/24/2018 showed no pathogenic mutations.  Genes tested include:  APC, ATM,  AXIN2, BARD1, BMPR1A, BRCA1, BRCA2, BRIP1, CDH1, CDKN2A (p14ARF), CDKN2A (p16INK4a), CKD4, CHEK2, CTNNA1, DICER1, EPCAM (Deletion/duplication testing only), GREM1 (promoter region deletion/duplication testing only), KIT, MEN1, MLH1, MSH2, MSH3, MSH6, MUTYH, NBN, NF1, NHTL1, PALB2, PDGFRA, PMS2, POLD1, POLE, PTEN, RAD50, RAD51C, RAD51D, SDHB, SDHC, SDHD, SMAD4, SMARCA4. STK11, TP53, TSC1, TSC2, and VHL.  The following genes were evaluated for sequence changes only: SDHA and HOXB13 c.251G>A variant only.   PLAN: Vena is doing well today.  Her CBC remains stable (CMET pending).  She continues to tolerate her neoadjuvant chemotherapy well.  She will proceed with her eighth cycle of Paclitaxel and Carboplatin today.    We of course are monitoring her closely for complications, and she has not developed any at this point, particularly peripheral neuropathy.    We talked about exercise and dietary recommendations.    We reviewed COVID19 recommendations and she is following these.    I went ahead and ordered her breast MRI for when she completes her chemotherapy.  Her anticipated chemotherapy completion date is June 2.    A total of (20) minutes of face-to-face time was spent with this patient with greater than 50% of that time in counseling and care-coordination.   Wilber Bihari, NP  06/29/18 9:26 AM Medical Oncology and Hematology [facility] [address] [place], [place] [address] Tel. [phone]    Fax. [phone]

## 2018-07-06 ENCOUNTER — Other Ambulatory Visit: Payer: Self-pay

## 2018-07-06 ENCOUNTER — Inpatient Hospital Stay: Payer: Medicare Other

## 2018-07-06 ENCOUNTER — Inpatient Hospital Stay (HOSPITAL_BASED_OUTPATIENT_CLINIC_OR_DEPARTMENT_OTHER): Payer: Medicare Other | Admitting: Adult Health

## 2018-07-06 ENCOUNTER — Encounter: Payer: Self-pay | Admitting: Adult Health

## 2018-07-06 ENCOUNTER — Telehealth: Payer: Self-pay | Admitting: *Deleted

## 2018-07-06 VITALS — BP 145/86 | HR 87 | Temp 97.6°F | Resp 18 | Ht 62.0 in | Wt 184.5 lb

## 2018-07-06 DIAGNOSIS — Z5111 Encounter for antineoplastic chemotherapy: Secondary | ICD-10-CM | POA: Diagnosis not present

## 2018-07-06 DIAGNOSIS — C50512 Malignant neoplasm of lower-outer quadrant of left female breast: Secondary | ICD-10-CM

## 2018-07-06 DIAGNOSIS — Z95828 Presence of other vascular implants and grafts: Secondary | ICD-10-CM

## 2018-07-06 DIAGNOSIS — C50412 Malignant neoplasm of upper-outer quadrant of left female breast: Secondary | ICD-10-CM | POA: Diagnosis not present

## 2018-07-06 DIAGNOSIS — Z171 Estrogen receptor negative status [ER-]: Secondary | ICD-10-CM

## 2018-07-06 DIAGNOSIS — Z79899 Other long term (current) drug therapy: Secondary | ICD-10-CM

## 2018-07-06 DIAGNOSIS — Z170 Estrogen receptor positive status [ER+]: Secondary | ICD-10-CM

## 2018-07-06 LAB — CBC WITH DIFFERENTIAL (CANCER CENTER ONLY)
Abs Immature Granulocytes: 0.01 10*3/uL (ref 0.00–0.07)
Basophils Absolute: 0 10*3/uL (ref 0.0–0.1)
Basophils Relative: 1 %
Eosinophils Absolute: 0 10*3/uL (ref 0.0–0.5)
Eosinophils Relative: 1 %
HCT: 34 % — ABNORMAL LOW (ref 36.0–46.0)
Hemoglobin: 10.4 g/dL — ABNORMAL LOW (ref 12.0–15.0)
Immature Granulocytes: 0 %
Lymphocytes Relative: 30 %
Lymphs Abs: 1.1 10*3/uL (ref 0.7–4.0)
MCH: 29.5 pg (ref 26.0–34.0)
MCHC: 30.6 g/dL (ref 30.0–36.0)
MCV: 96.6 fL (ref 80.0–100.0)
Monocytes Absolute: 0.3 10*3/uL (ref 0.1–1.0)
Monocytes Relative: 8 %
Neutro Abs: 2.3 10*3/uL (ref 1.7–7.7)
Neutrophils Relative %: 60 %
Platelet Count: 185 10*3/uL (ref 150–400)
RBC: 3.52 MIL/uL — ABNORMAL LOW (ref 3.87–5.11)
RDW: 15.1 % (ref 11.5–15.5)
WBC Count: 3.7 10*3/uL — ABNORMAL LOW (ref 4.0–10.5)
nRBC: 0 % (ref 0.0–0.2)

## 2018-07-06 LAB — CMP (CANCER CENTER ONLY)
ALT: 33 U/L (ref 0–44)
AST: 16 U/L (ref 15–41)
Albumin: 3.8 g/dL (ref 3.5–5.0)
Alkaline Phosphatase: 101 U/L (ref 38–126)
Anion gap: 11 (ref 5–15)
BUN: 14 mg/dL (ref 8–23)
CO2: 24 mmol/L (ref 22–32)
Calcium: 9.5 mg/dL (ref 8.9–10.3)
Chloride: 106 mmol/L (ref 98–111)
Creatinine: 0.74 mg/dL (ref 0.44–1.00)
GFR, Est AFR Am: >60 mL/min (ref >60)
GFR, Estimated: >60 mL/min (ref >60)
Glucose, Bld: 167 mg/dL — ABNORMAL HIGH (ref 70–99)
Potassium: 3.4 mmol/L — ABNORMAL LOW (ref 3.5–5.1)
Sodium: 141 mmol/L (ref 135–145)
Total Bilirubin: 0.2 mg/dL — ABNORMAL LOW (ref 0.3–1.2)
Total Protein: 7.1 g/dL (ref 6.5–8.1)

## 2018-07-06 MED ORDER — SODIUM CHLORIDE 0.9 % IV SOLN
80.0000 mg/m2 | Freq: Once | INTRAVENOUS | Status: AC
  Administered 2018-07-06: 12:00:00 150 mg via INTRAVENOUS
  Filled 2018-07-06: qty 25

## 2018-07-06 MED ORDER — SODIUM CHLORIDE 0.9 % IV SOLN
Freq: Once | INTRAVENOUS | Status: AC
  Administered 2018-07-06: 11:00:00 via INTRAVENOUS
  Filled 2018-07-06: qty 250

## 2018-07-06 MED ORDER — FAMOTIDINE IN NACL 20-0.9 MG/50ML-% IV SOLN
20.0000 mg | Freq: Once | INTRAVENOUS | Status: AC
  Administered 2018-07-06: 11:00:00 20 mg via INTRAVENOUS

## 2018-07-06 MED ORDER — HEPARIN SOD (PORK) LOCK FLUSH 100 UNIT/ML IV SOLN
500.0000 [IU] | Freq: Once | INTRAVENOUS | Status: AC | PRN
  Administered 2018-07-06: 14:00:00 500 [IU]
  Filled 2018-07-06: qty 5

## 2018-07-06 MED ORDER — PALONOSETRON HCL INJECTION 0.25 MG/5ML
0.2500 mg | Freq: Once | INTRAVENOUS | Status: AC
  Administered 2018-07-06: 11:00:00 0.25 mg via INTRAVENOUS

## 2018-07-06 MED ORDER — SODIUM CHLORIDE 0.9 % IV SOLN
187.2000 mg | Freq: Once | INTRAVENOUS | Status: AC
  Administered 2018-07-06: 13:00:00 190 mg via INTRAVENOUS
  Filled 2018-07-06: qty 19

## 2018-07-06 MED ORDER — SODIUM CHLORIDE 0.9% FLUSH
10.0000 mL | INTRAVENOUS | Status: DC | PRN
  Administered 2018-07-06: 14:00:00 10 mL
  Filled 2018-07-06: qty 10

## 2018-07-06 MED ORDER — DIPHENHYDRAMINE HCL 50 MG/ML IJ SOLN
INTRAMUSCULAR | Status: AC
  Filled 2018-07-06: qty 1

## 2018-07-06 MED ORDER — SODIUM CHLORIDE 0.9% FLUSH
10.0000 mL | Freq: Once | INTRAVENOUS | Status: AC
  Administered 2018-07-06: 10 mL
  Filled 2018-07-06: qty 10

## 2018-07-06 MED ORDER — PALONOSETRON HCL INJECTION 0.25 MG/5ML
INTRAVENOUS | Status: AC
  Filled 2018-07-06: qty 5

## 2018-07-06 MED ORDER — FAMOTIDINE IN NACL 20-0.9 MG/50ML-% IV SOLN
INTRAVENOUS | Status: AC
  Filled 2018-07-06: qty 50

## 2018-07-06 MED ORDER — DEXAMETHASONE SODIUM PHOSPHATE 10 MG/ML IJ SOLN
4.0000 mg | Freq: Once | INTRAMUSCULAR | Status: AC
  Administered 2018-07-06: 11:00:00 4 mg via INTRAVENOUS

## 2018-07-06 MED ORDER — DIPHENHYDRAMINE HCL 50 MG/ML IJ SOLN
25.0000 mg | Freq: Once | INTRAMUSCULAR | Status: AC
  Administered 2018-07-06: 11:00:00 25 mg via INTRAVENOUS

## 2018-07-06 MED ORDER — DEXAMETHASONE SODIUM PHOSPHATE 10 MG/ML IJ SOLN
INTRAMUSCULAR | Status: AC
  Filled 2018-07-06: qty 1

## 2018-07-06 NOTE — Progress Notes (Signed)
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 She underwent bilateral diagnostic mammography with tomography and left breast ultrasonography at [facility] on 02/16/2018 showing: breast density category C; two left breast masses, one at 2 o'clock and the other at 3:30 o'clock. The 2 o'clock mass (1.3 x 1 x 1 cm) corresponds to the mammographic abnormality and is highly suspicious for breast carcinoma. The other mass (1.2 x 0.7 x 1.2 cm)  is suspicious for breast carcinoma. No left axillary adenopathy.   Accordingly on 02/19/2018 she proceeded to biopsy of the left breast area in question. 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 Prognostic indicators significant for: estrogen receptor, 90% positive and progesterone receptor, 95% positive, both with strong staining intensity. Proliferation marker Ki67 at 5%. HER2 negative (1+).   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 Prognostic indicators significant for: estrogen receptor, 10% positive with moderate staining intensity and progesterone receptor, 0%  negative. Proliferation marker Ki67 at 40%. HER2 negative (1+).   The patient's subsequent history is as detailed below.   INTERVAL HISTORY: [name] returns today for follow-up and treatment of her estrogen positive and estrogen negative breast cancer.   She continues on paclitaxel and carboplatin weekly x12. Today is day 1 cycle 9.  She is tolerating these well.  She has no signs of peripheral neuropathy.    REVIEW OF SYSTEMS: [name] is continuing to feel well.  She is starting to feel more fatigued.  She is walking more though.  She says she has started by parking further away on treatment day and walking in.  [name] denies any fever, cough, chest pain, palpitations, shortness of breath.  She continues to be unable to feel any palpable tumor in her breast.  [name] denies any bowel/bladder changes, nausea, vomiting.  A detailed ROS was otherwise non contributory.    PAST MEDICAL HISTORY: Past Medical History:  Diagnosis Date  . Family history of breast cancer   . Family history of lung cancer   . Family history of throat cancer   . High cholesterol   . Hypertension   . Kidney stone   . Kidney stones   . UTI (lower urinary tract infection)     PAST SURGICAL HISTORY: Past Surgical History:  Procedure Laterality Date  . BREAST EXCISIONAL BIOPSY Left 1999,2005   cysts removed  . BREAST SURGERY Left    cyst and biopsy  . PORTACATH PLACEMENT Left 03/10/2018   Procedure: INSERTION PORT-A-CATH;  Surgeon: Stark Klein, MD;  Location: [facility];  Service: General;  Laterality: Left;    FAMILY HISTORY: Family History  Problem Relation Age of Onset  . Hypertension Mother   . Heart disease Mother   . Dementia Mother   . Healthy  Father   . Colon cancer Maternal Grandmother   . Cancer Paternal Grandmother        unk type  . Lung cancer Cousin        29s  . Breast cancer Cousin        3s  . Breast cancer Cousin        9s  . Breast cancer Cousin   . Cancer Paternal  Aunt        unk type d. 26, possibly pancreatic  . Cancer Paternal Aunt        unk type d. 24s  . Cancer Paternal Uncle        unk type   . Cancer Paternal Uncle        unk type d. 45s  . Cancer Maternal Aunt        unk type, d. 2s  . Throat cancer Maternal Uncle        d. 60s  . Ovarian cancer Neg Hx    Patient father is alive at 59 years old. Patient mother died from heart disease and dementia at age 85.  The patient denies a family hx of ovarian cancer. She has 3 siblings, 1 brother and 2 sisters. She has a maternal cousin diagnosed with lung cancer in her 59s. She has 3 paternal cousins with breast cancer, one was diagnosed in her 20s and has passed away.   GYNECOLOGIC HISTORY:  No LMP recorded. Patient is postmenopausal. Menarche: 68 years old Age at first live birth: n/a GX P 0 LMP 2010 Contraceptive n/a HRT no  Hysterectomy? no BSo? no   SOCIAL HISTORY: She is single and works as a Scientist, water quality at [employer]). She lives alone, with no pets.    ADVANCED DIRECTIVES: Not in place.  At the 03/03/2018 visit she was given the appropriate documents to complete and notarize at her discretion.  She is planning to name her niece, [name], as her HCPOA.   HEALTH MAINTENANCE: Social History   Tobacco Use  . Smoking status: Never Smoker  . Smokeless tobacco: Never Used  Substance Use Topics  . Alcohol use: Yes    Comment: seldom  . Drug use: No     Colonoscopy: 2014? [facility]  PAP: 02/02/2018  Bone density: 03/15/2016, T-score 0.4, Dr. Alden Hipp   No Known Allergies  Current Outpatient Medications  Medication Sig Dispense Refill  . amLODipine (NORVASC) 10 MG tablet Take 10 mg by mouth every morning.     Marland Kitchen aspirin 81 MG chewable tablet Chew 81 mg by mouth daily.    Marland Kitchen lidocaine-prilocaine (EMLA) cream Apply 1 application topically as needed. 30 g 0  . LORazepam (ATIVAN) 0.5 MG tablet Take 1 tablet (0.5 mg total) by mouth  every 6 (six) hours as needed (Nausea or vomiting). 30 tablet 0  . losartan (COZAAR) 100 MG tablet Take 100 mg by mouth every morning.     Marland Kitchen omeprazole (PRILOSEC) 40 MG capsule Take 1 capsule (40 mg total) by mouth daily. 30 capsule 5  . oxyCODONE (OXY IR/ROXICODONE) 5 MG immediate release tablet Take 1 tablet (5 mg total) by mouth every 6 (six) hours as needed for severe pain. 10 tablet 0  . potassium chloride SA (K-DUR,KLOR-CON) 20 MEQ tablet Take 1 tablet (20 mEq total) by mouth daily. 5 tablet 0  . pravastatin (PRAVACHOL) 20 MG tablet Take 20 mg by mouth every morning.     . prochlorperazine (COMPAZINE) 10 MG tablet  Take 1 tablet (10 mg total) by mouth every 6 (six) hours as needed (Nausea or vomiting). 30 tablet 1   No current facility-administered medications for this visit.     OBJECTIVE:  Vitals:   07/06/18 0951  BP: (!) 145/86  Pulse: 87  Resp: 18  Temp: 97.6 F (36.4 C)  SpO2: 100%     Body mass index is 33.75 kg/m.   Wt Readings from Last 3 Encounters:  07/06/18 184 lb 8 oz (83.7 kg)  06/29/18 184 lb 12.8 oz (83.8 kg)  06/22/18 184 lb (83.5 kg)  ECOG FS:1 GENERAL: Patient is a well appearing female in no acute distress HEENT:  Sclerae anicteric.  Oropharynx clear and moist. No ulcerations or evidence of oropharyngeal candidiasis. Neck is supple.  NODES:  No cervical, supraclavicular, or axillary lymphadenopathy palpated.  BREAST EXAM:  Deferred today, examined last week LUNGS:  Clear to auscultation bilaterally.  No wheezes or rhonchi. HEART:  Regular rate and rhythm. No murmur appreciated. ABDOMEN:  Soft, nontender.  Positive, normoactive bowel sounds. No organomegaly palpated. MSK:  No focal spinal tenderness to palpation. Full range of motion bilaterally in the upper extremities. EXTREMITIES:  No peripheral edema.   SKIN:  Clear with no obvious rashes or skin changes. + nail discoloration of her nail beds. NEURO:  Nonfocal. Well oriented.  Appropriate affect.      LAB RESULTS:  CMP     Component Value Date/Time   NA 141 07/06/2018 0914   K 3.4 (L) 07/06/2018 0914   CL 106 07/06/2018 0914   CO2 24 07/06/2018 0914   GLUCOSE 167 (H) 07/06/2018 0914   BUN 14 07/06/2018 0914   CREATININE 0.74 07/06/2018 0914   CALCIUM 9.5 07/06/2018 0914   PROT 7.1 07/06/2018 0914   ALBUMIN 3.8 07/06/2018 0914   AST 16 07/06/2018 0914   ALT 33 07/06/2018 0914   ALKPHOS 101 07/06/2018 0914   BILITOT 0.2 (L) 07/06/2018 0914   GFRNONAA >60 07/06/2018 0914   GFRAA >60 07/06/2018 0914    No results found for: TOTALPROTELP, ALBUMINELP, A1GS, A2GS, BETS, BETA2SER, GAMS, MSPIKE, SPEI  No results found for: KPAFRELGTCHN, LAMBDASER, KAPLAMBRATIO  Lab Results  Component Value Date   WBC 3.7 (L) 07/06/2018   NEUTROABS 2.3 07/06/2018   HGB 10.4 (L) 07/06/2018   HCT 34.0 (L) 07/06/2018   MCV 96.6 07/06/2018   PLT 185 07/06/2018    _0 @  No results found for: LABCA2  No components found for: IOEVOJ500  No results for input(s): INR in the last 168 hours.  No results found for: LABCA2  No results found for: XFG182  No results found for: XHB716  No results found for: RCV893  No results found for: CA2729  No components found for: HGQUANT  No results found for: CEA1 / No results found for: CEA1   No results found for: AFPTUMOR  No results found for: CHROMOGRNA  No results found for: PSA1  Appointment on 07/06/2018  Component Date Value Ref Range Status  . Sodium 07/06/2018 141  135 - 145 mmol/L Final  . Potassium 07/06/2018 3.4* 3.5 - 5.1 mmol/L Final  . Chloride 07/06/2018 106  98 - 111 mmol/L Final  . CO2 07/06/2018 24  22 - 32 mmol/L Final  . Glucose, Bld 07/06/2018 167* 70 - 99 mg/dL Final  . BUN 07/06/2018 14  8 - 23 mg/dL Final  . Creatinine 07/06/2018 0.74  0.44 - 1.00 mg/dL Final  . Calcium 07/06/2018 9.5  8.9 -  10.3 mg/dL Final  . Total Protein 07/06/2018 7.1  6.5 - 8.1 g/dL Final  . Albumin 07/06/2018 3.8  3.5 - 5.0  g/dL Final  . AST 07/06/2018 16  15 - 41 U/L Final  . ALT 07/06/2018 33  0 - 44 U/L Final  . Alkaline Phosphatase 07/06/2018 101  38 - 126 U/L Final  . Total Bilirubin 07/06/2018 0.2* 0.3 - 1.2 mg/dL Final  . GFR, Est Non Af Am 07/06/2018 >60  >60 mL/min Final  . GFR, Est AFR Am 07/06/2018 >60  >60 mL/min Final  . Anion gap 07/06/2018 11  5 - 15 Final   Performed at [facility], [place] [address] [place], [place] [address]  . WBC Count 07/06/2018 3.7* 4.0 - 10.5 K/uL Final  . RBC 07/06/2018 3.52* 3.87 - 5.11 MIL/uL Final  . Hemoglobin 07/06/2018 10.4* 12.0 - 15.0 g/dL Final  . HCT 07/06/2018 34.0* 36.0 - 46.0 % Final  . MCV 07/06/2018 96.6  80.0 - 100.0 fL Final  . MCH 07/06/2018 29.5  26.0 - 34.0 pg Final  . MCHC 07/06/2018 30.6  30.0 - 36.0 g/dL Final  . RDW 07/06/2018 15.1  11.5 - 15.5 % Final  . Platelet Count 07/06/2018 185  150 - 400 K/uL Final  . nRBC 07/06/2018 0.0  0.0 - 0.2 % Final  . Neutrophils Relative % 07/06/2018 60  % Final  . Neutro Abs 07/06/2018 2.3  1.7 - 7.7 K/uL Final  . Lymphocytes Relative 07/06/2018 30  % Final  . Lymphs Abs 07/06/2018 1.1  0.7 - 4.0 K/uL Final  . Monocytes Relative 07/06/2018 8  % Final  . Monocytes Absolute 07/06/2018 0.3  0.1 - 1.0 K/uL Final  . Eosinophils Relative 07/06/2018 1  % Final  . Eosinophils Absolute 07/06/2018 0.0  0.0 - 0.5 K/uL Final  . Basophils Relative 07/06/2018 1  % Final  . Basophils Absolute 07/06/2018 0.0  0.0 - 0.1 K/uL Final  . Immature Granulocytes 07/06/2018 0  % Final  . Abs Immature Granulocytes 07/06/2018 0.01  0.00 - 0.07 K/uL Final   Performed at [facility], [address] [place], [place] [address]    (this displays the last labs from the last 3 days)  No results found for: TOTALPROTELP, ALBUMINELP, A1GS, A2GS, BETS, BETA2SER, GAMS, MSPIKE, SPEI (this displays SPEP labs)  No results found for: KPAFRELGTCHN, LAMBDASER, KAPLAMBRATIO (kappa/lambda light  chains)  No results found for: HGBA, HGBA2QUANT, HGBFQUANT, HGBSQUAN (Hemoglobinopathy evaluation)   No results found for: LDH  No results found for: IRON, TIBC, IRONPCTSAT (Iron and TIBC)  No results found for: FERRITIN  Urinalysis    Component Value Date/Time   COLORURINE YELLOW 04/12/2018 1504   APPEARANCEUR CLEAR 04/12/2018 1504   LABSPEC 1.020 04/12/2018 1504   PHURINE 5.0 04/12/2018 1504   GLUCOSEU NEGATIVE 04/12/2018 1504   HGBUR NEGATIVE 04/12/2018 1504   BILIRUBINUR NEGATIVE 04/12/2018 1504   KETONESUR NEGATIVE 04/12/2018 1504   PROTEINUR NEGATIVE 04/12/2018 1504   UROBILINOGEN 0.2 07/07/2012 1159   NITRITE NEGATIVE 04/12/2018 1504   LEUKOCYTESUR TRACE (A) 04/12/2018 1504     STUDIES: No results found.   ELIGIBLE FOR AVAILABLE RESEARCH PROTOCOL: Upbeat   ASSESSMENT: 68 y.o. 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, grade 2, estrogen and progesterone receptor positive, HER-2 not amplified, with an MIB-1 of 5%  (  b) in the lower outer quadrant a clinical T1c N0, stage IA-B invasive ductal carcinoma, grade 2, estrogen receptor only moderately positive at 10%, progesterone receptor negative, with an MIB-1 of 40%, and HER-2 not amplified  (1) neoadjuvant chemotherapy will consist of doxorubicin and cyclophosphamide in dose dense fashion x4 starting 03/16/2018, followed by paclitaxel and carboplatin weekly x12  (a) echo on 03/11/2018 shows well preserved EF of 60-65%  (2) definitive surgery to follow  (3) adjuvant radiation as appropriate  (4) antiestrogens to follow at the completion of local treatment (for upper outer quadrant tumor)  (5) genetics testing on 03/24/2018 showed no pathogenic mutations.  Genes tested include:  APC, ATM, AXIN2, BARD1, BMPR1A, BRCA1, BRCA2, BRIP1, CDH1, CDKN2A (p14ARF), CDKN2A (p16INK4a), CKD4, CHEK2, CTNNA1,  DICER1, EPCAM (Deletion/duplication testing only), GREM1 (promoter region deletion/duplication testing only), KIT, MEN1, MLH1, MSH2, MSH3, MSH6, MUTYH, NBN, NF1, NHTL1, PALB2, PDGFRA, PMS2, POLD1, POLE, PTEN, RAD50, RAD51C, RAD51D, SDHB, SDHC, SDHD, SMAD4, SMARCA4. STK11, TP53, TSC1, TSC2, and VHL.  The following genes were evaluated for sequence changes only: SDHA and HOXB13 c.251G>A variant only.   PLAN: Hadlyn is doing well today.  Her labs remain stable and she continues to tolerate chemotherapy well with Paclitaxel and Carboplatin.  She has no peripheral neuropathy and we are monitoring closely for this.    I continued to recommend exercise,which she is doing.  She continues to follow [place] distancing recommendations.   Her breast MRI is scheduled for 6/5.  Should she have to stop chemotherapy earlier than anticipated, we will move it to earlier.  I will reach out to Dr. Barry Dienes to let her know that she is nearing the end of her neoadjuvant chemotherapy treatment.    A total of (20) minutes of face-to-face time was spent with this patient with greater than 50% of that time in counseling and care-coordination.   Wilber Bihari, NP  07/06/18 10:20 AM [facility] [address] [place], [place] [address] Tel. [phone]    Fax. [phone]

## 2018-07-06 NOTE — Patient Instructions (Signed)
[facility] Discharge Instructions for Patients Receiving Chemotherapy  Today you received the following chemotherapy agents Paclitaxel (TAXOL) & Carboplatin (PARAPLATIN).  To help prevent nausea and vomiting after your treatment, we encourage you to take your nausea medication as prescribed.   If you develop nausea and vomiting that is not controlled by your nausea medication, call the clinic.   BELOW ARE SYMPTOMS THAT SHOULD BE REPORTED IMMEDIATELY:  *FEVER GREATER THAN 100.5 F  *CHILLS WITH OR WITHOUT FEVER  NAUSEA AND VOMITING THAT IS NOT CONTROLLED WITH YOUR NAUSEA MEDICATION  *UNUSUAL SHORTNESS OF BREATH  *UNUSUAL BRUISING OR BLEEDING  TENDERNESS IN MOUTH AND THROAT WITH OR WITHOUT PRESENCE OF ULCERS  *URINARY PROBLEMS  *BOWEL PROBLEMS  UNUSUAL RASH Items with * indicate a potential emergency and should be followed up as soon as possible.  Feel free to call the clinic should you have any questions or concerns. The clinic phone number is [phone].  Please show the CHEMO ALERT CARD at check-in to the Emergency Department and triage nurse.  Coronavirus (COVID-19) Are you at risk?  Are you at risk for the Coronavirus (COVID-19)?  To be considered HIGH RISK for Coronavirus (COVID-19), you have to meet the following criteria:  . Traveled to [place], [place], [place], [place] or [place]; or in the [place] to [place], [place], [place], or [place]; and have fever, cough, and shortness of breath within the last 2 weeks of travel OR . Been in close contact with a person diagnosed with COVID-19 within the last 2 weeks and have fever, cough, and shortness of breath . IF YOU DO NOT MEET THESE CRITERIA, YOU ARE CONSIDERED LOW RISK FOR COVID-19.  What to do if you are HIGH RISK for COVID-19?  . If you are having a medical emergency, call 911. . Seek medical care right away. Before you go to a doctor's office, urgent care or emergency department,  call ahead and tell them about your recent travel, contact with someone diagnosed with COVID-19, and your symptoms. You should receive instructions from your physician's office regarding next steps of care.  . When you arrive at healthcare provider, tell the healthcare staff immediately you have returned from visiting [place], [place], [place], [place] or [place]; or traveled in the [place] to [place], [place], [place], or [place]; in the last two weeks or you have been in close contact with a person diagnosed with COVID-19 in the last 2 weeks.   . Tell the health care staff about your symptoms: fever, cough and shortness of breath. . After you have been seen by a medical provider, you will be either: o Tested for (COVID-19) and discharged home on quarantine except to seek medical care if symptoms worsen, and asked to  - Stay home and avoid contact with others until you get your results (4-5 days)  - Avoid travel on public transportation if possible (such as bus, train, or airplane) or o Sent to the Emergency Department by EMS for evaluation, COVID-19 testing, and possible admission depending on your condition and test results.  What to do if you are LOW RISK for COVID-19?  Reduce your risk of any infection by using the same precautions used for avoiding the common cold or flu:  . Wash your hands often with soap and warm water for at least 20 seconds.  If soap and water are not readily available, use an alcohol-based hand sanitizer with at least 60% alcohol.  .   If coughing or sneezing, cover your mouth and nose by coughing or sneezing into the elbow areas of your shirt or coat, into a tissue or into your sleeve (not your hands). . Avoid shaking hands with others and consider head nods or verbal greetings only. . Avoid touching your eyes, nose, or mouth with unwashed hands.  . Avoid close contact with people who are sick. . Avoid places or events with large numbers of people in one  location, like concerts or sporting events. . Carefully consider travel plans you have or are making. . If you are planning any travel outside or inside the [place], visit the CDC's Travelers' Health webpage for the latest health notices. . If you have some symptoms but not all symptoms, continue to monitor at home and seek medical attention if your symptoms worsen. . If you are having a medical emergency, call 911.   ADDITIONAL HEALTHCARE OPTIONS FOR PATIENTS  [facility] / e-Visit: [email or web address]         [facility]: [phone]  [facility]: [phone]                   [facility]: [phone]   

## 2018-07-06 NOTE — Telephone Encounter (Signed)
Left message to follow up during treatment.

## 2018-07-06 NOTE — Patient Instructions (Signed)

## 2018-07-13 ENCOUNTER — Encounter: Payer: Self-pay | Admitting: Adult Health

## 2018-07-13 ENCOUNTER — Inpatient Hospital Stay: Payer: Medicare Other

## 2018-07-13 ENCOUNTER — Inpatient Hospital Stay (HOSPITAL_BASED_OUTPATIENT_CLINIC_OR_DEPARTMENT_OTHER): Payer: Medicare Other | Admitting: Adult Health

## 2018-07-13 ENCOUNTER — Other Ambulatory Visit: Payer: Self-pay

## 2018-07-13 VITALS — BP 146/85 | HR 105 | Temp 98.0°F | Resp 17 | Ht 62.0 in | Wt 186.1 lb

## 2018-07-13 VITALS — HR 95

## 2018-07-13 DIAGNOSIS — Z170 Estrogen receptor positive status [ER+]: Secondary | ICD-10-CM

## 2018-07-13 DIAGNOSIS — Z79899 Other long term (current) drug therapy: Secondary | ICD-10-CM

## 2018-07-13 DIAGNOSIS — C50512 Malignant neoplasm of lower-outer quadrant of left female breast: Secondary | ICD-10-CM

## 2018-07-13 DIAGNOSIS — Z5111 Encounter for antineoplastic chemotherapy: Secondary | ICD-10-CM | POA: Diagnosis not present

## 2018-07-13 DIAGNOSIS — Z171 Estrogen receptor negative status [ER-]: Secondary | ICD-10-CM

## 2018-07-13 DIAGNOSIS — Z95828 Presence of other vascular implants and grafts: Secondary | ICD-10-CM

## 2018-07-13 DIAGNOSIS — C50412 Malignant neoplasm of upper-outer quadrant of left female breast: Secondary | ICD-10-CM | POA: Diagnosis not present

## 2018-07-13 LAB — CBC WITH DIFFERENTIAL (CANCER CENTER ONLY)
Abs Immature Granulocytes: 0.02 10*3/uL (ref 0.00–0.07)
Basophils Absolute: 0.1 10*3/uL (ref 0.0–0.1)
Basophils Relative: 1 %
Eosinophils Absolute: 0 10*3/uL (ref 0.0–0.5)
Eosinophils Relative: 1 %
HCT: 34.8 % — ABNORMAL LOW (ref 36.0–46.0)
Hemoglobin: 10.6 g/dL — ABNORMAL LOW (ref 12.0–15.0)
Immature Granulocytes: 1 %
Lymphocytes Relative: 36 %
Lymphs Abs: 1.4 10*3/uL (ref 0.7–4.0)
MCH: 29.3 pg (ref 26.0–34.0)
MCHC: 30.5 g/dL (ref 30.0–36.0)
MCV: 96.1 fL (ref 80.0–100.0)
Monocytes Absolute: 0.3 10*3/uL (ref 0.1–1.0)
Monocytes Relative: 7 %
Neutro Abs: 2.1 10*3/uL (ref 1.7–7.7)
Neutrophils Relative %: 54 %
Platelet Count: 185 10*3/uL (ref 150–400)
RBC: 3.62 MIL/uL — ABNORMAL LOW (ref 3.87–5.11)
RDW: 14.2 % (ref 11.5–15.5)
WBC Count: 3.8 10*3/uL — ABNORMAL LOW (ref 4.0–10.5)
nRBC: 0 % (ref 0.0–0.2)

## 2018-07-13 LAB — CMP (CANCER CENTER ONLY)
ALT: 27 U/L (ref 0–44)
AST: 16 U/L (ref 15–41)
Albumin: 3.8 g/dL (ref 3.5–5.0)
Alkaline Phosphatase: 113 U/L (ref 38–126)
Anion gap: 13 (ref 5–15)
BUN: 13 mg/dL (ref 8–23)
CO2: 24 mmol/L (ref 22–32)
Calcium: 9.8 mg/dL (ref 8.9–10.3)
Chloride: 105 mmol/L (ref 98–111)
Creatinine: 0.74 mg/dL (ref 0.44–1.00)
GFR, Est AFR Am: >60 mL/min (ref >60)
GFR, Estimated: >60 mL/min (ref >60)
Glucose, Bld: 163 mg/dL — ABNORMAL HIGH (ref 70–99)
Potassium: 3.6 mmol/L (ref 3.5–5.1)
Sodium: 142 mmol/L (ref 135–145)
Total Bilirubin: 0.2 mg/dL — ABNORMAL LOW (ref 0.3–1.2)
Total Protein: 7.1 g/dL (ref 6.5–8.1)

## 2018-07-13 MED ORDER — DIPHENHYDRAMINE HCL 50 MG/ML IJ SOLN
25.0000 mg | Freq: Once | INTRAMUSCULAR | Status: AC
  Administered 2018-07-13: 10:00:00 25 mg via INTRAVENOUS

## 2018-07-13 MED ORDER — SODIUM CHLORIDE 0.9 % IV SOLN
190.0000 mg | Freq: Once | INTRAVENOUS | Status: AC
  Administered 2018-07-13: 190 mg via INTRAVENOUS
  Filled 2018-07-13: qty 19

## 2018-07-13 MED ORDER — PALONOSETRON HCL INJECTION 0.25 MG/5ML
0.2500 mg | Freq: Once | INTRAVENOUS | Status: AC
  Administered 2018-07-13: 0.25 mg via INTRAVENOUS

## 2018-07-13 MED ORDER — PALONOSETRON HCL INJECTION 0.25 MG/5ML
INTRAVENOUS | Status: AC
  Filled 2018-07-13: qty 5

## 2018-07-13 MED ORDER — DIPHENHYDRAMINE HCL 50 MG/ML IJ SOLN
INTRAMUSCULAR | Status: AC
  Filled 2018-07-13: qty 1

## 2018-07-13 MED ORDER — FAMOTIDINE IN NACL 20-0.9 MG/50ML-% IV SOLN
20.0000 mg | Freq: Once | INTRAVENOUS | Status: AC
  Administered 2018-07-13: 11:00:00 20 mg via INTRAVENOUS

## 2018-07-13 MED ORDER — DEXAMETHASONE SODIUM PHOSPHATE 10 MG/ML IJ SOLN
4.0000 mg | Freq: Once | INTRAMUSCULAR | Status: AC
  Administered 2018-07-13: 10:00:00 4 mg via INTRAVENOUS

## 2018-07-13 MED ORDER — HEPARIN SOD (PORK) LOCK FLUSH 100 UNIT/ML IV SOLN
500.0000 [IU] | Freq: Once | INTRAVENOUS | Status: AC | PRN
  Administered 2018-07-13: 500 [IU]
  Filled 2018-07-13: qty 5

## 2018-07-13 MED ORDER — SODIUM CHLORIDE 0.9% FLUSH
10.0000 mL | Freq: Once | INTRAVENOUS | Status: AC
  Administered 2018-07-13: 09:00:00 10 mL
  Filled 2018-07-13: qty 10

## 2018-07-13 MED ORDER — SODIUM CHLORIDE 0.9 % IV SOLN
80.0000 mg/m2 | Freq: Once | INTRAVENOUS | Status: AC
  Administered 2018-07-13: 150 mg via INTRAVENOUS
  Filled 2018-07-13: qty 25

## 2018-07-13 MED ORDER — FAMOTIDINE IN NACL 20-0.9 MG/50ML-% IV SOLN
INTRAVENOUS | Status: AC
  Filled 2018-07-13: qty 50

## 2018-07-13 MED ORDER — SODIUM CHLORIDE 0.9 % IV SOLN
Freq: Once | INTRAVENOUS | Status: AC
  Administered 2018-07-13: 10:00:00 via INTRAVENOUS
  Filled 2018-07-13: qty 250

## 2018-07-13 MED ORDER — DEXAMETHASONE SODIUM PHOSPHATE 10 MG/ML IJ SOLN
INTRAMUSCULAR | Status: AC
  Filled 2018-07-13: qty 1

## 2018-07-13 MED ORDER — SODIUM CHLORIDE 0.9% FLUSH
10.0000 mL | INTRAVENOUS | Status: DC | PRN
  Administered 2018-07-13: 13:00:00 10 mL
  Filled 2018-07-13: qty 10

## 2018-07-13 NOTE — Progress Notes (Signed)
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 She underwent bilateral diagnostic mammography with tomography and left breast ultrasonography at [facility] on 02/16/2018 showing: breast density category C; two left breast masses, one at 2 o'clock and the other at 3:30 o'clock. The 2 o'clock mass (1.3 x 1 x 1 cm) corresponds to the mammographic abnormality and is highly suspicious for breast carcinoma. The other mass (1.2 x 0.7 x 1.2 cm)  is suspicious for breast carcinoma. No left axillary adenopathy.   Accordingly on 02/19/2018 she proceeded to biopsy of the left breast area in question. 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 Prognostic indicators significant for: estrogen receptor, 90% positive and progesterone receptor, 95% positive, both with strong staining intensity. Proliferation marker Ki67 at 5%. HER2 negative (1+).   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 Prognostic indicators significant for: estrogen receptor, 10% positive with moderate staining intensity and progesterone receptor, 0%  negative. Proliferation marker Ki67 at 40%. HER2 negative (1+).   The patient's subsequent history is as detailed below.   INTERVAL HISTORY: Myrah returns today for follow-up and treatment of her estrogen positive and estrogen negative breast cancer.   She continues on neoadjuvant paclitaxel and carboplatin weekly x12. Today is day 1 cycle 10.  She is tolerating these well.  She has no signs of peripheral neuropathy.    REVIEW OF SYSTEMS: Caylor continues to do well.  She is up two pounds.  She notes that she is walking more, but that she could do better.  She says her appetite is very good.  She denies any unusual headaches or vision changes.  She is without cough, shortness of breath, chest pain, palpitations, fever, or chills.  She has no mucositis, dysphagia, nausea, vomiting, bowel or bladder changes. She notes her breasts feel the same and she hasn't noted any areas of nodularity or mass.    PAST MEDICAL HISTORY: Past Medical History:  Diagnosis Date   Family history of breast cancer    Family history of lung cancer    Family history of throat cancer    High cholesterol    Hypertension    Kidney stone    Kidney stones    UTI (lower urinary tract infection)     PAST SURGICAL HISTORY: Past Surgical History:  Procedure Laterality Date   BREAST EXCISIONAL BIOPSY Left [id number]   cysts removed   BREAST SURGERY Left    cyst and biopsy   PORTACATH PLACEMENT Left 03/10/2018   Procedure: INSERTION PORT-A-CATH;  Surgeon: Stark Klein, MD;  Location: [facility];  Service: General;  Laterality: Left;    FAMILY HISTORY: Family History  Problem Relation Age of Onset   Hypertension Mother    Heart disease Mother  Dementia Mother    Healthy Father    Colon cancer Maternal Grandmother    Cancer Paternal Grandmother        unk type   Lung cancer Cousin        53s   Breast cancer Cousin        58s   Breast cancer Cousin        30s   Breast  cancer Cousin    Cancer Paternal Aunt        unk type d. 38, possibly pancreatic   Cancer Paternal Aunt        unk type d. 55s   Cancer Paternal Uncle        unk type    Cancer Paternal Uncle        unk type d. 48s   Cancer Maternal Aunt        unk type, d. 57s   Throat cancer Maternal Uncle        d. 36s   Ovarian cancer Neg Hx    Patient father is alive at 68 years old. Patient mother died from heart disease and dementia at age 67.  The patient denies a family hx of ovarian cancer. She has 3 siblings, 1 brother and 2 sisters. She has a maternal cousin diagnosed with lung cancer in her 57s. She has 3 paternal cousins with breast cancer, one was diagnosed in her 47s and has passed away.   GYNECOLOGIC HISTORY:  No LMP recorded. Patient is postmenopausal. Menarche: 68 years old Age at first live birth: n/a GX P 0 LMP 2010 Contraceptive n/a HRT no  Hysterectomy? no BSo? no   SOCIAL HISTORY: She is single and works as a Scientist, water quality at [employer]). She lives alone, with no pets.    ADVANCED DIRECTIVES: Not in place.  At the 03/03/2018 visit she was given the appropriate documents to complete and notarize at her discretion.  She is planning to name her niece, [name], as her HCPOA.   HEALTH MAINTENANCE: Social History   Tobacco Use   Smoking status: Never Smoker   Smokeless tobacco: Never Used  Substance Use Topics   Alcohol use: Yes    Comment: seldom   Drug use: No     Colonoscopy: 2014? [facility]  PAP: 02/02/2018  Bone density: 03/15/2016, T-score 0.4, Dr. Alden Hipp   No Known Allergies  Current Outpatient Medications  Medication Sig Dispense Refill   amLODipine (NORVASC) 10 MG tablet Take 10 mg by mouth every morning.      aspirin 81 MG chewable tablet Chew 81 mg by mouth daily.     lidocaine-prilocaine (EMLA) cream Apply 1 application topically as needed. 30 g 0   LORazepam (ATIVAN) 0.5 MG tablet Take 1  tablet (0.5 mg total) by mouth every 6 (six) hours as needed (Nausea or vomiting). 30 tablet 0   losartan (COZAAR) 100 MG tablet Take 100 mg by mouth every morning.      omeprazole (PRILOSEC) 40 MG capsule Take 1 capsule (40 mg total) by mouth daily. 30 capsule 5   oxyCODONE (OXY IR/ROXICODONE) 5 MG immediate release tablet Take 1 tablet (5 mg total) by mouth every 6 (six) hours as needed for severe pain. 10 tablet 0   potassium chloride SA (K-DUR,KLOR-CON) 20 MEQ tablet Take 1 tablet (20 mEq total) by mouth daily. 5 tablet 0   pravastatin (PRAVACHOL) 20 MG tablet Take 20 mg by mouth every morning.  prochlorperazine (COMPAZINE) 10 MG tablet Take 1 tablet (10 mg total) by mouth every 6 (six) hours as needed (Nausea or vomiting). 30 tablet 1   No current facility-administered medications for this visit.     OBJECTIVE:  There were no vitals filed for this visit.   There is no height or weight on file to calculate BMI.   Wt Readings from Last 3 Encounters:  07/06/18 184 lb 8 oz (83.7 kg)  06/29/18 184 lb 12.8 oz (83.8 kg)  06/22/18 184 lb (83.5 kg)  ECOG FS:1 GENERAL: Patient is a well appearing female in no acute distress HEENT:  Sclerae anicteric.  Oropharynx clear and moist. No ulcerations or evidence of oropharyngeal candidiasis. Neck is supple.  NODES:  No cervical, supraclavicular, or axillary lymphadenopathy palpated.  BREAST EXAM:  Deferred today, examined last week LUNGS:  Clear to auscultation bilaterally.  No wheezes or rhonchi. HEART:  Regular rate and rhythm. No murmur appreciated. ABDOMEN:  Soft, nontender.  Positive, normoactive bowel sounds. No organomegaly palpated. MSK:  No focal spinal tenderness to palpation. Full range of motion bilaterally in the upper extremities. EXTREMITIES:  No peripheral edema.   SKIN:  Clear with no obvious rashes or skin changes. + nail discoloration of her nail beds, no weakening of the nails or peeling of the nails NEURO:  Nonfocal.  Well oriented.  Appropriate affect.     LAB RESULTS:  CMP     Component Value Date/Time   NA 141 07/06/2018 0914   K 3.4 (L) 07/06/2018 0914   CL 106 07/06/2018 0914   CO2 24 07/06/2018 0914   GLUCOSE 167 (H) 07/06/2018 0914   BUN 14 07/06/2018 0914   CREATININE 0.74 07/06/2018 0914   CALCIUM 9.5 07/06/2018 0914   PROT 7.1 07/06/2018 0914   ALBUMIN 3.8 07/06/2018 0914   AST 16 07/06/2018 0914   ALT 33 07/06/2018 0914   ALKPHOS 101 07/06/2018 0914   BILITOT 0.2 (L) 07/06/2018 0914   GFRNONAA >60 07/06/2018 0914   GFRAA >60 07/06/2018 0914    No results found for: TOTALPROTELP, ALBUMINELP, A1GS, A2GS, BETS, BETA2SER, GAMS, MSPIKE, SPEI  No results found for: KPAFRELGTCHN, LAMBDASER, KAPLAMBRATIO  Lab Results  Component Value Date   WBC 3.7 (L) 07/06/2018   NEUTROABS 2.3 07/06/2018   HGB 10.4 (L) 07/06/2018   HCT 34.0 (L) 07/06/2018   MCV 96.6 07/06/2018   PLT 185 07/06/2018    '@LASTCHEMISTRY'$ @  No results found for: LABCA2  No components found for: IRSWNI627  No results for input(s): INR in the last 168 hours.  No results found for: LABCA2  No results found for: OJJ009  No results found for: FGH829  No results found for: HBZ169  No results found for: CA2729  No components found for: HGQUANT  No results found for: CEA1 / No results found for: CEA1   No results found for: AFPTUMOR  No results found for: CHROMOGRNA  No results found for: PSA1  No visits with results within 3 Day(s) from this visit.  Latest known visit with results is:  Appointment on 07/06/2018  Component Date Value Ref Range Status   Sodium 07/06/2018 141  135 - 145 mmol/L Final   Potassium 07/06/2018 3.4* 3.5 - 5.1 mmol/L Final   Chloride 07/06/2018 106  98 - 111 mmol/L Final   CO2 07/06/2018 24  22 - 32 mmol/L Final   Glucose, Bld 07/06/2018 167* 70 - 99 mg/dL Final   BUN 07/06/2018 14  8 - 23 mg/dL  Final   Creatinine 07/06/2018 0.74  0.44 - 1.00 mg/dL Final    Calcium 07/06/2018 9.5  8.9 - 10.3 mg/dL Final   Total Protein 07/06/2018 7.1  6.5 - 8.1 g/dL Final   Albumin 07/06/2018 3.8  3.5 - 5.0 g/dL Final   AST 07/06/2018 16  15 - 41 U/L Final   ALT 07/06/2018 33  0 - 44 U/L Final   Alkaline Phosphatase 07/06/2018 101  38 - 126 U/L Final   Total Bilirubin 07/06/2018 0.2* 0.3 - 1.2 mg/dL Final   GFR, Est Non Af Am 07/06/2018 >60  >60 mL/min Final   GFR, Est AFR Am 07/06/2018 >60  >60 mL/min Final   Anion gap 07/06/2018 11  5 - 15 Final   Performed at [facility], [place] [address] [place], [place] [address]   WBC Count 07/06/2018 3.7* 4.0 - 10.5 K/uL Final   RBC 07/06/2018 3.52* 3.87 - 5.11 MIL/uL Final   Hemoglobin 07/06/2018 10.4* 12.0 - 15.0 g/dL Final   HCT 07/06/2018 34.0* 36.0 - 46.0 % Final   MCV 07/06/2018 96.6  80.0 - 100.0 fL Final   MCH 07/06/2018 29.5  26.0 - 34.0 pg Final   MCHC 07/06/2018 30.6  30.0 - 36.0 g/dL Final   RDW 07/06/2018 15.1  11.5 - 15.5 % Final   Platelet Count 07/06/2018 185  150 - 400 K/uL Final   nRBC 07/06/2018 0.0  0.0 - 0.2 % Final   Neutrophils Relative % 07/06/2018 60  % Final   Neutro Abs 07/06/2018 2.3  1.7 - 7.7 K/uL Final   Lymphocytes Relative 07/06/2018 30  % Final   Lymphs Abs 07/06/2018 1.1  0.7 - 4.0 K/uL Final   Monocytes Relative 07/06/2018 8  % Final   Monocytes Absolute 07/06/2018 0.3  0.1 - 1.0 K/uL Final   Eosinophils Relative 07/06/2018 1  % Final   Eosinophils Absolute 07/06/2018 0.0  0.0 - 0.5 K/uL Final   Basophils Relative 07/06/2018 1  % Final   Basophils Absolute 07/06/2018 0.0  0.0 - 0.1 K/uL Final   Immature Granulocytes 07/06/2018 0  % Final   Abs Immature Granulocytes 07/06/2018 0.01  0.00 - 0.07 K/uL Final   Performed at [facility], [address] [place],  [address]    (this displays the last labs from the last 3 days)  No results found for: TOTALPROTELP, ALBUMINELP, A1GS, A2GS, BETS,  BETA2SER, GAMS, MSPIKE, SPEI (this displays SPEP labs)  No results found for: KPAFRELGTCHN, LAMBDASER, KAPLAMBRATIO (kappa/lambda light chains)  No results found for: HGBA, HGBA2QUANT, HGBFQUANT, HGBSQUAN (Hemoglobinopathy evaluation)   No results found for: LDH  No results found for: IRON, TIBC, IRONPCTSAT (Iron and TIBC)  No results found for: FERRITIN  Urinalysis    Component Value Date/Time   COLORURINE YELLOW 04/12/2018 1504   APPEARANCEUR CLEAR 04/12/2018 1504   LABSPEC 1.020 04/12/2018 1504   PHURINE 5.0 04/12/2018 1504   GLUCOSEU NEGATIVE 04/12/2018 1504   HGBUR NEGATIVE 04/12/2018 1504   BILIRUBINUR NEGATIVE 04/12/2018 1504   KETONESUR NEGATIVE 04/12/2018 1504   PROTEINUR NEGATIVE 04/12/2018 1504   UROBILINOGEN 0.2 07/07/2012 1159   NITRITE NEGATIVE 04/12/2018 1504   LEUKOCYTESUR TRACE (A) 04/12/2018 1504     STUDIES: No results found.   ELIGIBLE FOR AVAILABLE RESEARCH PROTOCOL: Upbeat   ASSESSMENT: 68 y.o. 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, grade 2, estrogen and progesterone receptor positive, HER-2 not amplified, with an MIB-1 of 5%  (b) in the lower outer quadrant a clinical T1c N0, stage IA-B invasive ductal carcinoma, grade 2, estrogen receptor only moderately positive at 10%, progesterone receptor negative, with an MIB-1 of 40%, and HER-2 not amplified  (1) neoadjuvant chemotherapy will consist of doxorubicin and cyclophosphamide in dose dense fashion x4 starting 03/16/2018, followed by paclitaxel and carboplatin weekly x12  (a) echo on 03/11/2018 shows well preserved EF of 60-65%  (2) definitive surgery to follow  (3) adjuvant radiation as appropriate  (4) antiestrogens to follow at the completion of local treatment (for upper outer quadrant tumor)  (5) genetics testing on 03/24/2018 showed no pathogenic mutations.   Genes tested include:  APC, ATM, AXIN2, BARD1, BMPR1A, BRCA1, BRCA2, BRIP1, CDH1, CDKN2A (p14ARF), CDKN2A (p16INK4a), CKD4, CHEK2, CTNNA1, DICER1, EPCAM (Deletion/duplication testing only), GREM1 (promoter region deletion/duplication testing only), KIT, MEN1, MLH1, MSH2, MSH3, MSH6, MUTYH, NBN, NF1, NHTL1, PALB2, PDGFRA, PMS2, POLD1, POLE, PTEN, RAD50, RAD51C, RAD51D, SDHB, SDHC, SDHD, SMAD4, SMARCA4. STK11, TP53, TSC1, TSC2, and VHL.  The following genes were evaluated for sequence changes only: SDHA and HOXB13 c.251G>A variant only.   PLAN: Chiyoko is doing well today.  She continues to tolerate her neoadjuvant treatment with Paclitaxel and Carboplatin quite well and will continue with this today and for two more cycles after today to complete 12 treatments so long as she continues to tolerate them well.  Her labs are stable.  I reviewed them with her in detail.  We continue to monitor her closely for any sign of peripheral neuropathy.    Aarian has her MRI scheduled on 6/5.  She does not have an upcoming appointment with Dr. Barry Dienes, so I will reach out to our navigators to get her set up.    We will continue to see Loie weekly for labs and f/u prior to her treatment at this point to monitor her closely for side effects as they are more common at this time period.    A total of (20) minutes of face-to-face time was spent with this patient with greater than 50% of that time in counseling and care-coordination.   Wilber Bihari, NP  07/13/18 8:06 AM Medical Oncology and Hematology [facility] [address] [place], [place] [address] Tel. [phone]    Fax. [phone]

## 2018-07-13 NOTE — Patient Instructions (Signed)
[facility] Discharge Instructions for Patients Receiving Chemotherapy  Today you received the following chemotherapy agents Taxol, Carboplatin  To help prevent nausea and vomiting after your treatment, we encourage you to take your nausea medication as directed  If you develop nausea and vomiting that is not controlled by your nausea medication, call the clinic.   BELOW ARE SYMPTOMS THAT SHOULD BE REPORTED IMMEDIATELY:  *FEVER GREATER THAN 100.5 F  *CHILLS WITH OR WITHOUT FEVER  NAUSEA AND VOMITING THAT IS NOT CONTROLLED WITH YOUR NAUSEA MEDICATION  *UNUSUAL SHORTNESS OF BREATH  *UNUSUAL BRUISING OR BLEEDING  TENDERNESS IN MOUTH AND THROAT WITH OR WITHOUT PRESENCE OF ULCERS  *URINARY PROBLEMS  *BOWEL PROBLEMS  UNUSUAL RASH Items with * indicate a potential emergency and should be followed up as soon as possible.  Feel free to call the clinic should you have any questions or concerns. The clinic phone number is [phone].  Please show the CHEMO ALERT CARD at check-in to the Emergency Department and triage nurse.   

## 2018-07-20 ENCOUNTER — Inpatient Hospital Stay: Payer: Medicare Other

## 2018-07-20 ENCOUNTER — Inpatient Hospital Stay (HOSPITAL_BASED_OUTPATIENT_CLINIC_OR_DEPARTMENT_OTHER): Payer: Medicare Other | Admitting: Adult Health

## 2018-07-20 ENCOUNTER — Encounter: Payer: Self-pay | Admitting: Adult Health

## 2018-07-20 ENCOUNTER — Encounter: Payer: Self-pay | Admitting: *Deleted

## 2018-07-20 ENCOUNTER — Other Ambulatory Visit: Payer: Self-pay

## 2018-07-20 VITALS — BP 122/79 | HR 90 | Temp 98.0°F | Resp 18 | Ht 62.0 in | Wt 185.8 lb

## 2018-07-20 DIAGNOSIS — C50412 Malignant neoplasm of upper-outer quadrant of left female breast: Secondary | ICD-10-CM

## 2018-07-20 DIAGNOSIS — C50512 Malignant neoplasm of lower-outer quadrant of left female breast: Secondary | ICD-10-CM

## 2018-07-20 DIAGNOSIS — Z170 Estrogen receptor positive status [ER+]: Secondary | ICD-10-CM

## 2018-07-20 DIAGNOSIS — Z171 Estrogen receptor negative status [ER-]: Secondary | ICD-10-CM | POA: Diagnosis not present

## 2018-07-20 DIAGNOSIS — Z5111 Encounter for antineoplastic chemotherapy: Secondary | ICD-10-CM | POA: Diagnosis not present

## 2018-07-20 DIAGNOSIS — Z79899 Other long term (current) drug therapy: Secondary | ICD-10-CM

## 2018-07-20 DIAGNOSIS — Z95828 Presence of other vascular implants and grafts: Secondary | ICD-10-CM

## 2018-07-20 LAB — CBC WITH DIFFERENTIAL (CANCER CENTER ONLY)
Abs Immature Granulocytes: 0.03 10*3/uL (ref 0.00–0.07)
Basophils Absolute: 0 10*3/uL (ref 0.0–0.1)
Basophils Relative: 1 %
Eosinophils Absolute: 0 10*3/uL (ref 0.0–0.5)
Eosinophils Relative: 1 %
HCT: 33.7 % — ABNORMAL LOW (ref 36.0–46.0)
Hemoglobin: 10.3 g/dL — ABNORMAL LOW (ref 12.0–15.0)
Immature Granulocytes: 1 %
Lymphocytes Relative: 35 %
Lymphs Abs: 1.4 10*3/uL (ref 0.7–4.0)
MCH: 28.9 pg (ref 26.0–34.0)
MCHC: 30.6 g/dL (ref 30.0–36.0)
MCV: 94.4 fL (ref 80.0–100.0)
Monocytes Absolute: 0.3 10*3/uL (ref 0.1–1.0)
Monocytes Relative: 9 %
Neutro Abs: 2.1 10*3/uL (ref 1.7–7.7)
Neutrophils Relative %: 53 %
Platelet Count: 185 10*3/uL (ref 150–400)
RBC: 3.57 MIL/uL — ABNORMAL LOW (ref 3.87–5.11)
RDW: 14.2 % (ref 11.5–15.5)
WBC Count: 3.9 10*3/uL — ABNORMAL LOW (ref 4.0–10.5)
nRBC: 0 % (ref 0.0–0.2)

## 2018-07-20 LAB — CMP (CANCER CENTER ONLY)
ALT: 31 U/L (ref 0–44)
AST: 18 U/L (ref 15–41)
Albumin: 3.9 g/dL (ref 3.5–5.0)
Alkaline Phosphatase: 89 U/L (ref 38–126)
Anion gap: 9 (ref 5–15)
BUN: 15 mg/dL (ref 8–23)
CO2: 26 mmol/L (ref 22–32)
Calcium: 9.8 mg/dL (ref 8.9–10.3)
Chloride: 106 mmol/L (ref 98–111)
Creatinine: 0.7 mg/dL (ref 0.44–1.00)
GFR, Est AFR Am: >60 mL/min (ref >60)
GFR, Estimated: >60 mL/min (ref >60)
Glucose, Bld: 99 mg/dL (ref 70–99)
Potassium: 3.9 mmol/L (ref 3.5–5.1)
Sodium: 141 mmol/L (ref 135–145)
Total Bilirubin: 0.3 mg/dL (ref 0.3–1.2)
Total Protein: 7 g/dL (ref 6.5–8.1)

## 2018-07-20 MED ORDER — PALONOSETRON HCL INJECTION 0.25 MG/5ML
INTRAVENOUS | Status: AC
  Filled 2018-07-20: qty 5

## 2018-07-20 MED ORDER — SODIUM CHLORIDE 0.9 % IV SOLN
Freq: Once | INTRAVENOUS | Status: AC
  Administered 2018-07-20: 12:00:00 via INTRAVENOUS
  Filled 2018-07-20: qty 250

## 2018-07-20 MED ORDER — PALONOSETRON HCL INJECTION 0.25 MG/5ML
0.2500 mg | Freq: Once | INTRAVENOUS | Status: AC
  Administered 2018-07-20: 0.25 mg via INTRAVENOUS

## 2018-07-20 MED ORDER — SODIUM CHLORIDE 0.9% FLUSH
10.0000 mL | INTRAVENOUS | Status: DC | PRN
  Administered 2018-07-20: 10 mL
  Filled 2018-07-20: qty 10

## 2018-07-20 MED ORDER — SODIUM CHLORIDE 0.9 % IV SOLN
80.0000 mg/m2 | Freq: Once | INTRAVENOUS | Status: AC
  Administered 2018-07-20: 150 mg via INTRAVENOUS
  Filled 2018-07-20: qty 25

## 2018-07-20 MED ORDER — DIPHENHYDRAMINE HCL 50 MG/ML IJ SOLN
INTRAMUSCULAR | Status: AC
  Filled 2018-07-20: qty 1

## 2018-07-20 MED ORDER — DIPHENHYDRAMINE HCL 50 MG/ML IJ SOLN
25.0000 mg | Freq: Once | INTRAMUSCULAR | Status: AC
  Administered 2018-07-20: 25 mg via INTRAVENOUS

## 2018-07-20 MED ORDER — SODIUM CHLORIDE 0.9 % IV SOLN
190.0000 mg | Freq: Once | INTRAVENOUS | Status: AC
  Administered 2018-07-20: 190 mg via INTRAVENOUS
  Filled 2018-07-20: qty 19

## 2018-07-20 MED ORDER — FAMOTIDINE IN NACL 20-0.9 MG/50ML-% IV SOLN
INTRAVENOUS | Status: AC
  Filled 2018-07-20: qty 50

## 2018-07-20 MED ORDER — SODIUM CHLORIDE 0.9% FLUSH
10.0000 mL | Freq: Once | INTRAVENOUS | Status: AC
  Administered 2018-07-20: 10 mL
  Filled 2018-07-20: qty 10

## 2018-07-20 MED ORDER — HEPARIN SOD (PORK) LOCK FLUSH 100 UNIT/ML IV SOLN
500.0000 [IU] | Freq: Once | INTRAVENOUS | Status: AC | PRN
  Administered 2018-07-20: 500 [IU]
  Filled 2018-07-20: qty 5

## 2018-07-20 MED ORDER — DEXAMETHASONE SODIUM PHOSPHATE 10 MG/ML IJ SOLN
INTRAMUSCULAR | Status: AC
  Filled 2018-07-20: qty 1

## 2018-07-20 MED ORDER — DEXAMETHASONE SODIUM PHOSPHATE 10 MG/ML IJ SOLN
4.0000 mg | Freq: Once | INTRAMUSCULAR | Status: AC
  Administered 2018-07-20: 12:00:00 4 mg via INTRAVENOUS

## 2018-07-20 MED ORDER — FAMOTIDINE IN NACL 20-0.9 MG/50ML-% IV SOLN
20.0000 mg | Freq: Once | INTRAVENOUS | Status: AC
  Administered 2018-07-20: 12:00:00 20 mg via INTRAVENOUS

## 2018-07-20 NOTE — Progress Notes (Signed)
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 She underwent bilateral diagnostic mammography with tomography and left breast ultrasonography at [facility] on 02/16/2018 showing: breast density category C; two left breast masses, one at 2 o'clock and the other at 3:30 o'clock. The 2 o'clock mass (1.3 x 1 x 1 cm) corresponds to the mammographic abnormality and is highly suspicious for breast carcinoma. The other mass (1.2 x 0.7 x 1.2 cm)  is suspicious for breast carcinoma. No left axillary adenopathy.   Accordingly on 02/19/2018 she proceeded to biopsy of the left breast area in question. 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 Prognostic indicators significant for: estrogen receptor, 90% positive and progesterone receptor, 95% positive, both with strong staining intensity. Proliferation marker Ki67 at 5%. HER2 negative (1+).   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 Prognostic indicators significant for: estrogen receptor, 10% positive with moderate staining intensity and progesterone receptor, 0%  negative. Proliferation marker Ki67 at 40%. HER2 negative (1+).   The patient's subsequent history is as detailed below.   INTERVAL HISTORY: Emily Chambers returns today for follow-up and treatment of her estrogen positive and estrogen negative breast cancer.   She continues on neoadjuvant paclitaxel and carboplatin weekly x12. Today is day 1 cycle 11.  She continues to tolerate this well and has no signs of peripheral neuropathy.  REVIEW OF SYSTEMS: Emily Chambers is feeling well.  She is fatigued, and notes that each week is slightly worse than the previous.  She denies any new pain.  She notes her nails still have been discolored, but they are not brittle.  She remains slightly active and takes brief walks daily.  She has her upcoming breast MRI and f/u with Dr. Barry Dienes scheduled for the first week in June.    Emily Chambers denies any headaches, vision changes, mucositis, or appetite decrease.  She is without dysphagia, nausea, vomiting, bowel or bladder changes.  She denies cough, shortness of breath, chest pain, or palpitations.  Her left breast continues to be soft without anything that she is able to palpate.    PAST MEDICAL HISTORY: Past Medical History:  Diagnosis Date  . Family history of breast cancer   . Family history of lung cancer   . Family history of throat cancer   . High cholesterol   . Hypertension   . Kidney stone   . Kidney stones   . UTI (lower urinary tract infection)     PAST SURGICAL HISTORY: Past Surgical History:  Procedure Laterality Date  . BREAST EXCISIONAL BIOPSY Left 1999,2005   cysts removed  . BREAST SURGERY Left    cyst and biopsy  . PORTACATH PLACEMENT Left 03/10/2018   Procedure: INSERTION PORT-A-CATH;  Surgeon: Stark Klein, MD;  Location: [facility];  Service: General;  Laterality: Left;    FAMILY HISTORY: Family History  Problem Relation Age of Onset  . Hypertension Mother   . Heart disease Mother   . Dementia Mother   . Healthy Father   .  Colon cancer Maternal Grandmother   . Cancer Paternal Grandmother        unk type  . Lung cancer Cousin        64s  . Breast cancer Cousin        43s  . Breast cancer Cousin        73s  . Breast cancer Cousin   . Cancer Paternal Aunt        unk type d. 57, possibly pancreatic  . Cancer Paternal Aunt        unk type d. 32s  . Cancer Paternal Uncle        unk type   . Cancer Paternal Uncle        unk type d. 74s  . Cancer Maternal Aunt        unk type, d. 108s  . Throat cancer Maternal Uncle        d. 84s  . Ovarian cancer Neg Hx    Patient father is alive at 26 years old. Patient mother died from heart disease and dementia at age 69.  The patient denies a family hx of ovarian cancer. She has 3 siblings, 1 brother and 2 sisters. She has a maternal cousin diagnosed with lung cancer in her 25s. She has 3 paternal cousins with breast cancer, one was diagnosed in her 45s and has passed away.   GYNECOLOGIC HISTORY:  No LMP recorded. Patient is postmenopausal. Menarche: 68 years old Age at first live birth: n/a GX P 0 LMP 2010 Contraceptive n/a HRT no  Hysterectomy? no BSo? no   SOCIAL HISTORY: She is single and works as a Scientist, water quality at [employer]). She lives alone, with no pets.    ADVANCED DIRECTIVES: Not in place.  At the 03/03/2018 visit she was given the appropriate documents to complete and notarize at her discretion.  She is planning to name her niece, Emily Chambers, as her HCPOA.   HEALTH MAINTENANCE: Social History   Tobacco Use  . Smoking status: Never Smoker  . Smokeless tobacco: Never Used  Substance Use Topics  . Alcohol use: Yes    Comment: seldom  . Drug use: No     Colonoscopy: 2014? [facility]  PAP: 02/02/2018  Bone density: 03/15/2016, T-score 0.4, Dr. Alden Hipp   No Known Allergies  Current Outpatient Medications  Medication Sig Dispense Refill  . amLODipine (NORVASC) 10 MG tablet Take 10 mg by mouth every  morning.     Marland Kitchen aspirin 81 MG chewable tablet Chew 81 mg by mouth daily.    Marland Kitchen lidocaine-prilocaine (EMLA) cream Apply 1 application topically as needed. 30 g 0  . LORazepam (ATIVAN) 0.5 MG tablet Take 1 tablet (0.5 mg total) by mouth every 6 (six) hours as needed (Nausea or vomiting). 30 tablet 0  . losartan (COZAAR) 100 MG tablet Take 100 mg by mouth every morning.     Marland Kitchen omeprazole (PRILOSEC) 40 MG capsule Take 1 capsule (40 mg total) by mouth daily. 30 capsule 5  . oxyCODONE (OXY IR/ROXICODONE) 5 MG immediate release tablet Take 1 tablet (5 mg total) by mouth every 6 (six) hours as needed for severe pain. 10 tablet 0  . potassium chloride SA (  K-DUR,KLOR-CON) 20 MEQ tablet Take 1 tablet (20 mEq total) by mouth daily. 5 tablet 0  . pravastatin (PRAVACHOL) 20 MG tablet Take 20 mg by mouth every morning.     . prochlorperazine (COMPAZINE) 10 MG tablet Take 1 tablet (10 mg total) by mouth every 6 (six) hours as needed (Nausea or vomiting). 30 tablet 1   No current facility-administered medications for this visit.    Facility-Administered Medications Ordered in Other Visits  Medication Dose Route Frequency Provider Last Rate Last Dose  . CARBOplatin (PARAPLATIN) 190 mg in sodium chloride 0.9 % 250 mL chemo infusion  190 mg Intravenous Once Magrinat, Virgie Dad, MD      . PACLitaxel (TAXOL) 150 mg in sodium chloride 0.9 % 250 mL chemo infusion (</= 102m/m2)  80 mg/m2 (Treatment Plan Recorded) Intravenous Once Magrinat, GVirgie Dad MD 275 mL/hr at 07/20/18 1301 150 mg at 07/20/18 1301    OBJECTIVE:  Vitals:   07/20/18 1047  BP: 122/79  Pulse: 90  Resp: 18  Temp: 98 F (36.7 C)  SpO2: 100%     Body mass index is 33.98 kg/m.   Wt Readings from Last 3 Encounters:  07/20/18 185 lb 12.8 oz (84.3 kg)  07/13/18 186 lb 1.6 oz (84.4 kg)  07/06/18 184 lb 8 oz (83.7 kg)  ECOG FS:1 GENERAL: Patient is a well appearing female in no acute distress HEENT:  Sclerae anicteric.  Oropharynx clear and moist. No  ulcerations or evidence of oropharyngeal candidiasis. Neck is supple.  NODES:  No cervical, supraclavicular, or axillary lymphadenopathy palpated.  BREAST EXAM:  Unable to palpate a left breast mass, right breast benign LUNGS:  Clear to auscultation bilaterally.  No wheezes or rhonchi. HEART:  Regular rate and rhythm. No murmur appreciated. ABDOMEN:  Soft, nontender.  Positive, normoactive bowel sounds. No organomegaly palpated. MSK:  No focal spinal tenderness to palpation. Full range of motion bilaterally in the upper extremities. EXTREMITIES:  No peripheral edema.   SKIN:  Clear with no obvious rashes or skin changes. + nail discoloration of her nail beds, no weakening of the nails or peeling of the nails NEURO:  Nonfocal. Well oriented.  Appropriate affect.     LAB RESULTS:  CMP     Component Value Date/Time   NA 141 07/20/2018 1013   K 3.9 07/20/2018 1013   CL 106 07/20/2018 1013   CO2 26 07/20/2018 1013   GLUCOSE 99 07/20/2018 1013   BUN 15 07/20/2018 1013   CREATININE 0.70 07/20/2018 1013   CALCIUM 9.8 07/20/2018 1013   PROT 7.0 07/20/2018 1013   ALBUMIN 3.9 07/20/2018 1013   AST 18 07/20/2018 1013   ALT 31 07/20/2018 1013   ALKPHOS 89 07/20/2018 1013   BILITOT 0.3 07/20/2018 1013   GFRNONAA >60 07/20/2018 1013   GFRAA >60 07/20/2018 1013    No results found for: TOTALPROTELP, ALBUMINELP, A1GS, A2GS, BETS, BETA2SER, GAMS, MSPIKE, SPEI  No results found for: KPAFRELGTCHN, LAMBDASER, KAPLAMBRATIO  Lab Results  Component Value Date   WBC 3.9 (L) 07/20/2018   NEUTROABS 2.1 07/20/2018   HGB 10.3 (L) 07/20/2018   HCT 33.7 (L) 07/20/2018   MCV 94.4 07/20/2018   PLT 185 07/20/2018    _0 @  No results found for: LABCA2  No components found for: LALPFXT024 No results for input(s): INR in the last 168 hours.  No results found for: LABCA2  No results found for: COXB353 No results found for: CGDJ242 No results found for: CAST419  No results found  for: CA2729  No components found for: HGQUANT  No results found for: CEA1 / No results found for: CEA1   No results found for: AFPTUMOR  No results found for: CHROMOGRNA  No results found for: PSA1  Appointment on 07/20/2018  Component Date Value Ref Range Status  . Sodium 07/20/2018 141  135 - 145 mmol/L Final  . Potassium 07/20/2018 3.9  3.5 - 5.1 mmol/L Final  . Chloride 07/20/2018 106  98 - 111 mmol/L Final  . CO2 07/20/2018 26  22 - 32 mmol/L Final  . Glucose, Bld 07/20/2018 99  70 - 99 mg/dL Final  . BUN 07/20/2018 15  8 - 23 mg/dL Final  . Creatinine 07/20/2018 0.70  0.44 - 1.00 mg/dL Final  . Calcium 07/20/2018 9.8  8.9 - 10.3 mg/dL Final  . Total Protein 07/20/2018 7.0  6.5 - 8.1 g/dL Final  . Albumin 07/20/2018 3.9  3.5 - 5.0 g/dL Final  . AST 07/20/2018 18  15 - 41 U/L Final  . ALT 07/20/2018 31  0 - 44 U/L Final  . Alkaline Phosphatase 07/20/2018 89  38 - 126 U/L Final  . Total Bilirubin 07/20/2018 0.3  0.3 - 1.2 mg/dL Final  . GFR, Est Non Af Am 07/20/2018 >60  >60 mL/min Final  . GFR, Est AFR Am 07/20/2018 >60  >60 mL/min Final  . Anion gap 07/20/2018 9  5 - 15 Final   Performed at [facility], [place] [address] [place], [place] [address]  . WBC Count 07/20/2018 3.9* 4.0 - 10.5 K/uL Final  . RBC 07/20/2018 3.57* 3.87 - 5.11 MIL/uL Final  . Hemoglobin 07/20/2018 10.3* 12.0 - 15.0 g/dL Final  . HCT 07/20/2018 33.7* 36.0 - 46.0 % Final  . MCV 07/20/2018 94.4  80.0 - 100.0 fL Final  . MCH 07/20/2018 28.9  26.0 - 34.0 pg Final  . MCHC 07/20/2018 30.6  30.0 - 36.0 g/dL Final  . RDW 07/20/2018 14.2  11.5 - 15.5 % Final  . Platelet Count 07/20/2018 185  150 - 400 K/uL Final  . nRBC 07/20/2018 0.0  0.0 - 0.2 % Final  . Neutrophils Relative % 07/20/2018 53  % Final  . Neutro Abs 07/20/2018 2.1  1.7 - 7.7 K/uL Final  . Lymphocytes Relative 07/20/2018 35  % Final  . Lymphs Abs 07/20/2018 1.4  0.7 - 4.0 K/uL Final  . Monocytes Relative 07/20/2018  9  % Final  . Monocytes Absolute 07/20/2018 0.3  0.1 - 1.0 K/uL Final  . Eosinophils Relative 07/20/2018 1  % Final  . Eosinophils Absolute 07/20/2018 0.0  0.0 - 0.5 K/uL Final  . Basophils Relative 07/20/2018 1  % Final  . Basophils Absolute 07/20/2018 0.0  0.0 - 0.1 K/uL Final  . Immature Granulocytes 07/20/2018 1  % Final  . Abs Immature Granulocytes 07/20/2018 0.03  0.00 - 0.07 K/uL Final   Performed at [facility], [place] [address] [place], [place] [address]    (this displays the last labs from the last 3 days)  No results found for: TOTALPROTELP, ALBUMINELP, A1GS, A2GS, BETS, BETA2SER, GAMS, MSPIKE, SPEI (this displays SPEP labs)  No results found for: KPAFRELGTCHN, LAMBDASER, KAPLAMBRATIO (kappa/lambda light chains)  No results found for: HGBA, HGBA2QUANT, HGBFQUANT, HGBSQUAN (Hemoglobinopathy evaluation)   No results found for: LDH  No results found for: IRON, TIBC, IRONPCTSAT (Iron and TIBC)  No results found for: FERRITIN  Urinalysis    Component  Value Date/Time   COLORURINE YELLOW 04/12/2018 1504   APPEARANCEUR CLEAR 04/12/2018 1504   LABSPEC 1.020 04/12/2018 1504   PHURINE 5.0 04/12/2018 1504   GLUCOSEU NEGATIVE 04/12/2018 1504   HGBUR NEGATIVE 04/12/2018 1504   BILIRUBINUR NEGATIVE 04/12/2018 1504   KETONESUR NEGATIVE 04/12/2018 1504   PROTEINUR NEGATIVE 04/12/2018 1504   UROBILINOGEN 0.2 07/07/2012 1159   NITRITE NEGATIVE 04/12/2018 1504   LEUKOCYTESUR TRACE (A) 04/12/2018 1504     STUDIES: No results found.   ELIGIBLE FOR AVAILABLE RESEARCH PROTOCOL: Upbeat   ASSESSMENT: 68 y.o. 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, grade 2, estrogen and progesterone receptor positive, HER-2 not amplified, with an MIB-1 of 5%  (b) in the lower outer quadrant a clinical T1c N0, stage IA-B invasive ductal  carcinoma, grade 2, estrogen receptor only moderately positive at 10%, progesterone receptor negative, with an MIB-1 of 40%, and HER-2 not amplified  (1) neoadjuvant chemotherapy will consist of doxorubicin and cyclophosphamide in dose dense fashion x4 starting 03/16/2018, followed by paclitaxel and carboplatin weekly x12  (a) echo on 03/11/2018 shows well preserved EF of 60-65%  (2) definitive surgery to follow  (3) adjuvant radiation as appropriate  (4) antiestrogens to follow at the completion of local treatment (for upper outer quadrant tumor)  (5) genetics testing on 03/24/2018 showed no pathogenic mutations.  Genes tested include:  APC, ATM, AXIN2, BARD1, BMPR1A, BRCA1, BRCA2, BRIP1, CDH1, CDKN2A (p14ARF), CDKN2A (p16INK4a), CKD4, CHEK2, CTNNA1, DICER1, EPCAM (Deletion/duplication testing only), GREM1 (promoter region deletion/duplication testing only), KIT, MEN1, MLH1, MSH2, MSH3, MSH6, MUTYH, NBN, NF1, NHTL1, PALB2, PDGFRA, PMS2, POLD1, POLE, PTEN, RAD50, RAD51C, RAD51D, SDHB, SDHC, SDHD, SMAD4, SMARCA4. STK11, TP53, TSC1, TSC2, and VHL.  The following genes were evaluated for sequence changes only: SDHA and HOXB13 c.251G>A variant only.   PLAN: Emily Chambers is doing well today.  Her labs remain stable.  She continues to tolerate her neoadjuvant chemotherapy well and will continue this weekly until next week.  She will undergo MRI breasts on 07/30/2018 and has f/u with Dr. Barry Dienes on 08/02/2018.  She has done very well with treatment.  I continue to recommend exercise for Emily Chambers along with healthy diet with plenty of fruits and vegetables.  She notes she is doing this.    Emily Chambers and I discussed her treatment plan, and what to expect after radiation.  There was a question as to whether or not she may be eligible for [id number] Pembro study since she has one essentially triple negative breast cancer.  I reviewed this with Dr. Lindi Adie who is the medical director of research who verified that she is not  eligible, because of her other estrogen positive breast cancer.  Therefore, she can have her port removed at the time of her surgery.    We will continue to see Emily Chambers weekly for labs and f/u prior to her treatment at this point to monitor her closely for side effects as they are more common at this time period.  She will see Dr. Jana Hakim next week for her final treatment.  She knows to call for any questions or concerns that may arise prior to her next appointment.  A total of (20) minutes of face-to-face time was spent with this patient with greater than 50% of that time in counseling and care-coordination.   Wilber Bihari, NP  07/20/18 1:08 PM [facility]  [place], [place] [address] Tel. [phone]    Fax. [phone]

## 2018-07-20 NOTE — Patient Instructions (Signed)
[facility] °Discharge Instructions for Patients Receiving Chemotherapy ° °Today you received the following chemotherapy agents Taxol; Carboplatin ° °To help prevent nausea and vomiting after your treatment, we encourage you to take your nausea medication as directed °  °If you develop nausea and vomiting that is not controlled by your nausea medication, call the clinic.  ° °BELOW ARE SYMPTOMS THAT SHOULD BE REPORTED IMMEDIATELY: °· *FEVER GREATER THAN 100.5 F °· *CHILLS WITH OR WITHOUT FEVER °· NAUSEA AND VOMITING THAT IS NOT CONTROLLED WITH YOUR NAUSEA MEDICATION °· *UNUSUAL SHORTNESS OF BREATH °· *UNUSUAL BRUISING OR BLEEDING °· TENDERNESS IN MOUTH AND THROAT WITH OR WITHOUT PRESENCE OF ULCERS °· *URINARY PROBLEMS °· *BOWEL PROBLEMS °· UNUSUAL RASH °Items with * indicate a potential emergency and should be followed up as soon as possible. ° °Feel free to call the clinic should you have any questions or concerns. The clinic phone number is [phone]. ° °Please show the CHEMO ALERT CARD at check-in to the Emergency Department and triage nurse. ° ° °

## 2018-07-21 ENCOUNTER — Telehealth: Payer: Self-pay | Admitting: Oncology

## 2018-07-21 NOTE — Telephone Encounter (Signed)
Tried to reach regarding schedule °

## 2018-07-26 NOTE — Progress Notes (Signed)
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   She continues on neoadjuvant paclitaxel and carboplatin weekly x12. Today is day 1 cycle 12; she will finish today.  She continues to tolerate this well. She reports some neuropathy the last two treatments, which has been more noticeable recently, and diminished, metallic taste. She notes the sense of taste is improving.  She is scheduled to undergo bilateral breast MRI on 07/30/2018 and to see her surgeon on 08/02/2018.   REVIEW OF SYSTEMS: Lance reports doing well overall. She is glad to be finishing treatment today. The patient denies unusual headaches, visual changes, nausea, vomiting, stiff neck, dizziness, or gait imbalance. There has been no cough, phlegm production, or pleurisy, no chest pain or pressure, and no change in bowel or bladder habits. The patient denies fever, rash, bleeding, unexplained fatigue or unexplained weight loss. A detailed review of systems was otherwise entirely negative.   HISTORY OF CURRENT ILLNESS: From the original intake note:  [name] had routine screening mammography on 02/10/2018 showing a possible abnormality in the left breast. She underwent bilateral diagnostic mammography with tomography and left breast  ultrasonography at [facility] on 02/16/2018 showing: breast density category C; two left breast masses, one at 2 o'clock and the other at 3:30 o'clock. The 2 o'clock mass (1.3 x 1 x 1 cm) corresponds to the mammographic abnormality and is highly suspicious for breast carcinoma. The other mass (1.2 x 0.7 x 1.2 cm)  is suspicious for breast carcinoma. No left axillary adenopathy.   Accordingly on 02/19/2018 she proceeded to biopsy of the left breast area in question. The pathology from this procedure ([id number]) showed:  1) 3:30 o'clock specimen showed invasive mammary carcinoma, possibly lobular (weak and atypical e-cadherin expression), grade 2. Prognostic indicators significant for: estrogen receptor, 90% positive and progesterone receptor, 95% positive, both with strong staining intensity. Proliferation marker Ki67 at 5%. HER2 negative (1+).   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 Prognostic indicators significant for: estrogen receptor, 10% positive with moderate staining intensity and progesterone receptor, 0% negative. Proliferation marker Ki67 at 40%. HER2 negative (1+).   The patient's subsequent history is as detailed below.   PAST MEDICAL HISTORY: Past Medical History:  Diagnosis Date  . Family history of breast cancer   . Family history of lung cancer   . Family history of throat cancer   . High cholesterol   . Hypertension   . Kidney stone   . Kidney stones   . UTI (lower urinary tract infection)     PAST SURGICAL HISTORY: Past Surgical History:  Procedure Laterality Date  . BREAST EXCISIONAL BIOPSY Left 1999,2005   cysts removed  . BREAST SURGERY Left    cyst and biopsy  . PORTACATH PLACEMENT Left 03/10/2018   Procedure: INSERTION PORT-A-CATH;  Surgeon: Stark Klein, MD;  Location: [facility];  Service: General;  Laterality: Left;    FAMILY HISTORY: Family History  Problem Relation Age of Onset  . Hypertension Mother    . Heart disease Mother   . Dementia Mother   . Healthy Father   . Colon cancer Maternal Grandmother   . Cancer Paternal Grandmother        unk type  . Lung cancer Cousin        96s  . Breast cancer Cousin        22s  . Breast cancer Cousin        23s  . Breast cancer Cousin   . Cancer Paternal Aunt        unk type d. 11, possibly pancreatic  . Cancer Paternal Aunt        unk type d. 46s  . Cancer Paternal Uncle        unk type   . Cancer Paternal Uncle        unk type d. 74s  . Cancer Maternal Aunt        unk type, d. 31s  . Throat cancer Maternal Uncle        d. 85s  . Ovarian cancer Neg Hx    Patient father is alive at 68 years old. Patient mother died from heart disease and dementia at age 57.  The patient denies a family hx of ovarian cancer. She has 3 siblings, 1 brother and 2 sisters. She has a maternal cousin diagnosed with lung cancer in her 64s. She has 3 paternal cousins with breast cancer, one was diagnosed in her 16s and has passed away.   GYNECOLOGIC HISTORY:  No LMP recorded. Patient is postmenopausal. Menarche: 68 years old Age at first live birth: n/a GX P 0 LMP 2010 Contraceptive n/a HRT no  Hysterectomy? no BSo? no   SOCIAL HISTORY: She is single and works as a Scientist, water quality at [employer]). She lives alone, with no pets.    ADVANCED DIRECTIVES: Not in place.  At the 03/03/2018 visit she was given the appropriate documents to complete and notarize at her discretion.  She is planning to name her niece, [name], as her HCPOA.   HEALTH MAINTENANCE: Social History   Tobacco Use  . Smoking status: Never Smoker  . Smokeless tobacco: Never Used  Substance Use Topics  . Alcohol use: Yes    Comment: seldom  . Drug use: No     Colonoscopy: 2014? [facility]  PAP: 02/02/2018  Bone density: 03/15/2016, T-score 0.4, Dr. Alden Hipp   No Known Allergies  Current Outpatient Medications  Medication Sig Dispense  Refill  . amLODipine (NORVASC) 10 MG tablet Take 10 mg by mouth every morning.     Marland Kitchen aspirin 81 MG chewable tablet Chew 81 mg by mouth daily.    Marland Kitchen lidocaine-prilocaine (EMLA) cream Apply 1 application topically as needed. 30 g 0  . LORazepam (ATIVAN) 0.5 MG tablet Take 1 tablet (0.5 mg total) by mouth every 6 (six) hours as needed (Nausea or vomiting). 30 tablet 0  . losartan (COZAAR) 100 MG tablet Take 100 mg by mouth every morning.     Marland Kitchen omeprazole (PRILOSEC) 40 MG capsule Take 1 capsule (40 mg total) by mouth daily. 30 capsule 5  . oxyCODONE (OXY IR/ROXICODONE) 5 MG immediate release tablet Take 1 tablet (5 mg total) by mouth every 6 (six) hours as needed for severe pain. 10 tablet 0  . potassium chloride SA (K-DUR,KLOR-CON) 20 MEQ tablet Take 1  tablet (20 mEq total) by mouth daily. 5 tablet 0  . pravastatin (PRAVACHOL) 20 MG tablet Take 20 mg by mouth every morning.     . prochlorperazine (COMPAZINE) 10 MG tablet Take 1 tablet (10 mg total) by mouth every 6 (six) hours as needed (Nausea or vomiting). 30 tablet 1   No current facility-administered medications for this visit.     OBJECTIVE: Middle-aged African-American woman in no acute distress Vitals:   07/27/18 1044  BP: 124/75  Pulse: 92  Resp: 18  Temp: 98.7 F (37.1 C)  SpO2: 99%     Body mass index is 34 kg/m.   Wt Readings from Last 3 Encounters:  07/27/18 185 lb 14.4 oz (84.3 kg)  07/20/18 185 lb 12.8 oz (84.3 kg)  07/13/18 186 lb 1.6 oz (84.4 kg)  ECOG FS:1  Sclerae unicteric, EOMs intact Masked No cervical or supraclavicular adenopathy Lungs no rales or rhonchi Heart regular rate and rhythm Abd soft, nontender, positive bowel sounds MSK no focal spinal tenderness, no upper extremity lymphedema Neuro: nonfocal, well oriented, appropriate affect Breasts: I do not palpate a mass in the left breast.  There are no skin or nipple changes of concern.  Axillae are benign.   LAB RESULTS:  CMP     Component Value  Date/Time   NA 141 07/20/2018 1013   K 3.9 07/20/2018 1013   CL 106 07/20/2018 1013   CO2 26 07/20/2018 1013   GLUCOSE 99 07/20/2018 1013   BUN 15 07/20/2018 1013   CREATININE 0.70 07/20/2018 1013   CALCIUM 9.8 07/20/2018 1013   PROT 7.0 07/20/2018 1013   ALBUMIN 3.9 07/20/2018 1013   AST 18 07/20/2018 1013   ALT 31 07/20/2018 1013   ALKPHOS 89 07/20/2018 1013   BILITOT 0.3 07/20/2018 1013   GFRNONAA >60 07/20/2018 1013   GFRAA >60 07/20/2018 1013    No results found for: TOTALPROTELP, ALBUMINELP, A1GS, A2GS, BETS, BETA2SER, GAMS, MSPIKE, SPEI  No results found for: KPAFRELGTCHN, LAMBDASER, KAPLAMBRATIO  Lab Results  Component Value Date   WBC 3.8 (L) 07/27/2018   NEUTROABS 2.0 07/27/2018   HGB 10.4 (L) 07/27/2018   HCT 33.4 (L) 07/27/2018   MCV 94.6 07/27/2018   PLT 189 07/27/2018    _0 @  No results found for: LABCA2  No components found for: OEHOZY248  No results for input(s): INR in the last 168 hours.  No results found for: LABCA2  No results found for: GNO037  No results found for: CWU889  No results found for: VQX450  No results found for: CA2729  No components found for: HGQUANT  No results found for: CEA1 / No results found for: CEA1   No results found for: AFPTUMOR  No results found for: CHROMOGRNA  No results found for: PSA1  Appointment on 07/27/2018  Component Date Value Ref Range Status  . WBC Count 07/27/2018 3.8* 4.0 - 10.5 K/uL Final  . RBC 07/27/2018 3.53* 3.87 - 5.11 MIL/uL Final  . Hemoglobin 07/27/2018 10.4* 12.0 - 15.0 g/dL Final  . HCT 07/27/2018 33.4* 36.0 - 46.0 % Final  . MCV 07/27/2018 94.6  80.0 - 100.0 fL Final  . MCH 07/27/2018 29.5  26.0 - 34.0 pg Final  . MCHC 07/27/2018 31.1  30.0 - 36.0 g/dL Final  . RDW 07/27/2018 14.6  11.5 - 15.5 % Final  . Platelet Count 07/27/2018 189  150 - 400 K/uL Final  . nRBC 07/27/2018 0.0  0.0 - 0.2 % Final  . Neutrophils  Relative % 07/27/2018 53  % Final  . Neutro Abs  07/27/2018 2.0  1.7 - 7.7 K/uL Final  . Lymphocytes Relative 07/27/2018 37  % Final  . Lymphs Abs 07/27/2018 1.4  0.7 - 4.0 K/uL Final  . Monocytes Relative 07/27/2018 8  % Final  . Monocytes Absolute 07/27/2018 0.3  0.1 - 1.0 K/uL Final  . Eosinophils Relative 07/27/2018 0  % Final  . Eosinophils Absolute 07/27/2018 0.0  0.0 - 0.5 K/uL Final  . Basophils Relative 07/27/2018 1  % Final  . Basophils Absolute 07/27/2018 0.0  0.0 - 0.1 K/uL Final  . Immature Granulocytes 07/27/2018 1  % Final  . Abs Immature Granulocytes 07/27/2018 0.02  0.00 - 0.07 K/uL Final   Performed at [facility], [address] [place], [place] [address]    (this displays the last labs from the last 3 days)  No results found for: TOTALPROTELP, ALBUMINELP, A1GS, A2GS, BETS, BETA2SER, GAMS, MSPIKE, SPEI (this displays SPEP labs)  No results found for: KPAFRELGTCHN, LAMBDASER, KAPLAMBRATIO (kappa/lambda light chains)  No results found for: HGBA, HGBA2QUANT, HGBFQUANT, HGBSQUAN (Hemoglobinopathy evaluation)   No results found for: LDH  No results found for: IRON, TIBC, IRONPCTSAT (Iron and TIBC)  No results found for: FERRITIN  Urinalysis    Component Value Date/Time   COLORURINE YELLOW 04/12/2018 1504   APPEARANCEUR CLEAR 04/12/2018 1504   LABSPEC 1.020 04/12/2018 1504   PHURINE 5.0 04/12/2018 1504   GLUCOSEU NEGATIVE 04/12/2018 1504   HGBUR NEGATIVE 04/12/2018 1504   BILIRUBINUR NEGATIVE 04/12/2018 1504   KETONESUR NEGATIVE 04/12/2018 1504   PROTEINUR NEGATIVE 04/12/2018 1504   UROBILINOGEN 0.2 07/07/2012 1159   NITRITE NEGATIVE 04/12/2018 1504   LEUKOCYTESUR TRACE (A) 04/12/2018 1504     STUDIES: No results found.   ELIGIBLE FOR AVAILABLE RESEARCH PROTOCOL: Upbeat   ASSESSMENT: 68 y.o. 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, grade 2,  estrogen and progesterone receptor positive, HER-2 not amplified, with an MIB-1 of 5%  (b) in the lower outer quadrant a clinical T1c N0, stage IA-B invasive ductal carcinoma, grade 2, estrogen receptor only moderately positive at 10%, progesterone receptor negative, with an MIB-1 of 40%, and HER-2 not amplified  (1) neoadjuvant chemotherapy will consist of doxorubicin and cyclophosphamide in dose dense fashion x4 starting 03/16/2018, followed by paclitaxel and carboplatin weekly x12  (a) echo on 03/11/2018 shows well preserved EF of 60-65%  (2) definitive surgery to follow  (3) adjuvant radiation as appropriate  (4) antiestrogens to follow at the completion of local treatment (for upper outer quadrant tumor)  (5) genetics testing on 03/24/2018 showed no pathogenic mutations.  Genes tested include:  APC, ATM, AXIN2, BARD1, BMPR1A, BRCA1, BRCA2, BRIP1, CDH1, CDKN2A (p14ARF), CDKN2A (p16INK4a), CKD4, CHEK2, CTNNA1, DICER1, EPCAM (Deletion/duplication testing only), GREM1 (promoter region deletion/duplication testing only), KIT, MEN1, MLH1, MSH2, MSH3, MSH6, MUTYH, NBN, NF1, NHTL1, PALB2, PDGFRA, PMS2, POLD1, POLE, PTEN, RAD50, RAD51C, RAD51D, SDHB, SDHC, SDHD, SMAD4, SMARCA4. STK11, TP53, TSC1, TSC2, and VHL.  The following genes were evaluated for sequence changes only: SDHA and HOXB13 c.251G>A variant only.   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  Accordingly I am stopping the paclitaxel today.  I will substitute Gemzar 1 dose for this last chemo treatment.  Of course she will receive carboplatin today as planned  She did remarkably well with  her treatment I am hoping the peripheral neuropathy she is experiencing, which is not disabling, will in any case completely resolved in the near future  She is scheduled for breast MRI 07/30/2018 and then to see Dr. Barry Dienes to discuss those results 08/02/2018.  I would think that she will have her breast surgery sometime before the end  of this month.  She will see me again 09/13/2018.  She will probably be just starting radiation treatments around that time.  We will discuss antiestrogens in detail that  She knows to call for any other issue that may develop before the next visit.  Virgie Dad. Sri Clegg, MD  07/27/18 11:08 AM Medical Oncology and Hematology [facility] [address] [place], [place] [address] Tel. [phone]    Fax. [phone]   I, Wilburn Mylar, am acting as scribe for Dr. Virgie Dad. Kyndal Heringer.  I, Lurline Del MD, have reviewed the above documentation for accuracy and completeness, and I agree with the above.

## 2018-07-27 ENCOUNTER — Other Ambulatory Visit: Payer: Self-pay

## 2018-07-27 ENCOUNTER — Inpatient Hospital Stay: Payer: Medicare Other | Attending: Oncology

## 2018-07-27 ENCOUNTER — Telehealth: Payer: Self-pay | Admitting: *Deleted

## 2018-07-27 ENCOUNTER — Inpatient Hospital Stay (HOSPITAL_BASED_OUTPATIENT_CLINIC_OR_DEPARTMENT_OTHER): Payer: Medicare Other | Admitting: Oncology

## 2018-07-27 ENCOUNTER — Inpatient Hospital Stay: Payer: Medicare Other

## 2018-07-27 ENCOUNTER — Other Ambulatory Visit: Payer: Self-pay | Admitting: *Deleted

## 2018-07-27 VITALS — BP 124/75 | HR 92 | Temp 98.7°F | Resp 18 | Ht 62.0 in | Wt 185.9 lb

## 2018-07-27 DIAGNOSIS — Z171 Estrogen receptor negative status [ER-]: Secondary | ICD-10-CM

## 2018-07-27 DIAGNOSIS — C50512 Malignant neoplasm of lower-outer quadrant of left female breast: Secondary | ICD-10-CM

## 2018-07-27 DIAGNOSIS — G620 Drug-induced polyneuropathy: Secondary | ICD-10-CM | POA: Insufficient documentation

## 2018-07-27 DIAGNOSIS — T451X5A Adverse effect of antineoplastic and immunosuppressive drugs, initial encounter: Secondary | ICD-10-CM | POA: Diagnosis not present

## 2018-07-27 DIAGNOSIS — Z170 Estrogen receptor positive status [ER+]: Secondary | ICD-10-CM | POA: Diagnosis not present

## 2018-07-27 DIAGNOSIS — Z95828 Presence of other vascular implants and grafts: Secondary | ICD-10-CM

## 2018-07-27 DIAGNOSIS — C50412 Malignant neoplasm of upper-outer quadrant of left female breast: Secondary | ICD-10-CM | POA: Insufficient documentation

## 2018-07-27 DIAGNOSIS — Z5111 Encounter for antineoplastic chemotherapy: Secondary | ICD-10-CM | POA: Insufficient documentation

## 2018-07-27 LAB — CBC WITH DIFFERENTIAL (CANCER CENTER ONLY)
Abs Immature Granulocytes: 0.02 10*3/uL (ref 0.00–0.07)
Basophils Absolute: 0 10*3/uL (ref 0.0–0.1)
Basophils Relative: 1 %
Eosinophils Absolute: 0 10*3/uL (ref 0.0–0.5)
Eosinophils Relative: 0 %
HCT: 33.4 % — ABNORMAL LOW (ref 36.0–46.0)
Hemoglobin: 10.4 g/dL — ABNORMAL LOW (ref 12.0–15.0)
Immature Granulocytes: 1 %
Lymphocytes Relative: 37 %
Lymphs Abs: 1.4 10*3/uL (ref 0.7–4.0)
MCH: 29.5 pg (ref 26.0–34.0)
MCHC: 31.1 g/dL (ref 30.0–36.0)
MCV: 94.6 fL (ref 80.0–100.0)
Monocytes Absolute: 0.3 10*3/uL (ref 0.1–1.0)
Monocytes Relative: 8 %
Neutro Abs: 2 10*3/uL (ref 1.7–7.7)
Neutrophils Relative %: 53 %
Platelet Count: 189 10*3/uL (ref 150–400)
RBC: 3.53 MIL/uL — ABNORMAL LOW (ref 3.87–5.11)
RDW: 14.6 % (ref 11.5–15.5)
WBC Count: 3.8 10*3/uL — ABNORMAL LOW (ref 4.0–10.5)
nRBC: 0 % (ref 0.0–0.2)

## 2018-07-27 LAB — CMP (CANCER CENTER ONLY)
ALT: 30 U/L (ref 0–44)
AST: 19 U/L (ref 15–41)
Albumin: 3.9 g/dL (ref 3.5–5.0)
Alkaline Phosphatase: 84 U/L (ref 38–126)
Anion gap: 11 (ref 5–15)
BUN: 17 mg/dL (ref 8–23)
CO2: 25 mmol/L (ref 22–32)
Calcium: 9.6 mg/dL (ref 8.9–10.3)
Chloride: 108 mmol/L (ref 98–111)
Creatinine: 0.74 mg/dL (ref 0.44–1.00)
GFR, Est AFR Am: >60 mL/min (ref >60)
GFR, Estimated: >60 mL/min (ref >60)
Glucose, Bld: 170 mg/dL — ABNORMAL HIGH (ref 70–99)
Potassium: 3.5 mmol/L (ref 3.5–5.1)
Sodium: 144 mmol/L (ref 135–145)
Total Bilirubin: 0.2 mg/dL — ABNORMAL LOW (ref 0.3–1.2)
Total Protein: 7 g/dL (ref 6.5–8.1)

## 2018-07-27 MED ORDER — SODIUM CHLORIDE 0.9 % IV SOLN
800.0000 mg/m2 | Freq: Once | INTRAVENOUS | Status: AC
  Administered 2018-07-27: 1482 mg via INTRAVENOUS
  Filled 2018-07-27: qty 38.98

## 2018-07-27 MED ORDER — SODIUM CHLORIDE 0.9% FLUSH
10.0000 mL | Freq: Once | INTRAVENOUS | Status: AC
  Administered 2018-07-27: 10 mL
  Filled 2018-07-27: qty 10

## 2018-07-27 MED ORDER — DIPHENHYDRAMINE HCL 50 MG/ML IJ SOLN
25.0000 mg | Freq: Once | INTRAMUSCULAR | Status: AC
  Administered 2018-07-27: 25 mg via INTRAVENOUS

## 2018-07-27 MED ORDER — SODIUM CHLORIDE 0.9% FLUSH
10.0000 mL | INTRAVENOUS | Status: DC | PRN
  Administered 2018-07-27: 15:00:00 10 mL
  Filled 2018-07-27: qty 10

## 2018-07-27 MED ORDER — DIPHENHYDRAMINE HCL 50 MG/ML IJ SOLN
INTRAMUSCULAR | Status: AC
  Filled 2018-07-27: qty 1

## 2018-07-27 MED ORDER — SODIUM CHLORIDE 0.9 % IV SOLN
Freq: Once | INTRAVENOUS | Status: AC
  Administered 2018-07-27: 12:00:00 via INTRAVENOUS
  Filled 2018-07-27: qty 250

## 2018-07-27 MED ORDER — FAMOTIDINE IN NACL 20-0.9 MG/50ML-% IV SOLN
20.0000 mg | Freq: Once | INTRAVENOUS | Status: AC
  Administered 2018-07-27: 20 mg via INTRAVENOUS

## 2018-07-27 MED ORDER — PALONOSETRON HCL INJECTION 0.25 MG/5ML
INTRAVENOUS | Status: AC
  Filled 2018-07-27: qty 5

## 2018-07-27 MED ORDER — FAMOTIDINE IN NACL 20-0.9 MG/50ML-% IV SOLN
INTRAVENOUS | Status: AC
  Filled 2018-07-27: qty 50

## 2018-07-27 MED ORDER — HEPARIN SOD (PORK) LOCK FLUSH 100 UNIT/ML IV SOLN
500.0000 [IU] | Freq: Once | INTRAVENOUS | Status: AC | PRN
  Administered 2018-07-27: 500 [IU]
  Filled 2018-07-27: qty 5

## 2018-07-27 MED ORDER — PALONOSETRON HCL INJECTION 0.25 MG/5ML
0.2500 mg | Freq: Once | INTRAVENOUS | Status: AC
  Administered 2018-07-27: 0.25 mg via INTRAVENOUS

## 2018-07-27 MED ORDER — DEXAMETHASONE SODIUM PHOSPHATE 10 MG/ML IJ SOLN
4.0000 mg | Freq: Once | INTRAMUSCULAR | Status: AC
  Administered 2018-07-27: 4 mg via INTRAVENOUS

## 2018-07-27 MED ORDER — DEXAMETHASONE SODIUM PHOSPHATE 10 MG/ML IJ SOLN
INTRAMUSCULAR | Status: AC
  Filled 2018-07-27: qty 1

## 2018-07-27 MED ORDER — SODIUM CHLORIDE 0.9 % IV SOLN: 187.2000 mg | Freq: Once | INTRAVENOUS | Status: DC

## 2018-07-27 MED ORDER — SODIUM CHLORIDE 0.9 % IV SOLN
187.2000 mg | Freq: Once | INTRAVENOUS | Status: AC
  Administered 2018-07-27: 190 mg via INTRAVENOUS
  Filled 2018-07-27: qty 19

## 2018-07-27 NOTE — Telephone Encounter (Signed)
Spoke with patient to congratulate her on her last chemo.  She states she is excited but also scared.  She has her MRI later this week and appointment with Dr. Barry Dienes on 6/8.  Encouraged her to call with any needs or concerns.

## 2018-07-27 NOTE — Patient Instructions (Signed)

## 2018-07-27 NOTE — Patient Instructions (Signed)
[facility] Discharge Instructions for Patients Receiving Chemotherapy  Today you received the following chemotherapy agents Gemzar/Carboplatin  To help prevent nausea and vomiting after your treatment, we encourage you to take your nausea medication as directed.   If you develop nausea and vomiting that is not controlled by your nausea medication, call the clinic.   BELOW ARE SYMPTOMS THAT SHOULD BE REPORTED IMMEDIATELY:  *FEVER GREATER THAN 100.5 F  *CHILLS WITH OR WITHOUT FEVER  NAUSEA AND VOMITING THAT IS NOT CONTROLLED WITH YOUR NAUSEA MEDICATION  *UNUSUAL SHORTNESS OF BREATH  *UNUSUAL BRUISING OR BLEEDING  TENDERNESS IN MOUTH AND THROAT WITH OR WITHOUT PRESENCE OF ULCERS  *URINARY PROBLEMS  *BOWEL PROBLEMS  UNUSUAL RASH Items with * indicate a potential emergency and should be followed up as soon as possible.  Feel free to call the clinic should you have any questions or concerns. The clinic phone number is [phone].  Please show the [facility] at check-in to the Emergency Department and triage nurse.  Gemcitabine injection What is this medicine? GEMCITABINE (jem SYE ta been) is a chemotherapy drug. This medicine is used to treat many types of cancer like breast cancer, lung cancer, pancreatic cancer, and ovarian cancer. This medicine may be used for other purposes; ask your health care provider or pharmacist if you have questions. COMMON BRAND NAME(S): Gemzar, Infugem What should I tell my health care provider before I take this medicine? They need to know if you have any of these conditions: -blood disorders -infection -kidney disease -liver disease -lung or breathing disease, like asthma -recent or ongoing radiation therapy -an unusual or allergic reaction to gemcitabine, other chemotherapy, other medicines, foods, dyes, or preservatives -pregnant or trying to get pregnant -breast-feeding How should I use this medicine? This drug  is given as an infusion into a vein. It is administered in a hospital or clinic by a specially trained health care professional. Talk to your pediatrician regarding the use of this medicine in children. Special care may be needed. Overdosage: If you think you have taken too much of this medicine contact a poison control center or emergency room at once. NOTE: This medicine is only for you. Do not share this medicine with others. What if I miss a dose? It is important not to miss your dose. Call your doctor or health care professional if you are unable to keep an appointment. What may interact with this medicine? -medicines to increase blood counts like filgrastim, pegfilgrastim, sargramostim -some other chemotherapy drugs like cisplatin -vaccines Talk to your doctor or health care professional before taking any of these medicines: -acetaminophen -aspirin -ibuprofen -ketoprofen -naproxen This list may not describe all possible interactions. Give your health care provider a list of all the medicines, herbs, non-prescription drugs, or dietary supplements you use. Also tell them if you smoke, drink alcohol, or use illegal drugs. Some items may interact with your medicine. What should I watch for while using this medicine? Visit your doctor for checks on your progress. This drug may make you feel generally unwell. This is not uncommon, as chemotherapy can affect healthy cells as well as cancer cells. Report any side effects. Continue your course of treatment even though you feel ill unless your doctor tells you to stop. In some cases, you may be given additional medicines to help with side effects. Follow all directions for their use. Call your doctor or health care professional for advice if you get a fever, chills or sore throat,  or other symptoms of a cold or flu. Do not treat yourself. This drug decreases your body's ability to fight infections. Try to avoid being around people who are sick. This  medicine may increase your risk to bruise or bleed. Call your doctor or health care professional if you notice any unusual bleeding. Be careful brushing and flossing your teeth or using a toothpick because you may get an infection or bleed more easily. If you have any dental work done, tell your dentist you are receiving this medicine. Avoid taking products that contain aspirin, acetaminophen, ibuprofen, naproxen, or ketoprofen unless instructed by your doctor. These medicines may hide a fever. Do not become pregnant while taking this medicine or for 6 months after stopping it. Women should inform their doctor if they wish to become pregnant or think they might be pregnant. Men should not father a child while taking this medicine and for 3 months after stopping it. There is a potential for serious side effects to an unborn child. Talk to your health care professional or pharmacist for more information. Do not breast-feed an infant while taking this medicine or for at least 1 week after stopping it. Men should inform their doctors if they wish to father a child. This medicine may lower sperm counts. Talk with your doctor or health care professional if you are concerned about your fertility. What side effects may I notice from receiving this medicine? Side effects that you should report to your doctor or health care professional as soon as possible: -allergic reactions like skin rash, itching or hives, swelling of the face, lips, or tongue -breathing problems -pain, redness, or irritation at site where injected -signs and symptoms of a dangerous change in heartbeat or heart rhythm like chest pain; dizziness; fast or irregular heartbeat; palpitations; feeling faint or lightheaded, falls; breathing problems -signs of decreased platelets or bleeding - bruising, pinpoint red spots on the skin, black, tarry stools, blood in the urine -signs of decreased red blood cells - unusually weak or tired, feeling faint  or lightheaded, falls -signs of infection - fever or chills, cough, sore throat, pain or difficulty passing urine -signs and symptoms of kidney injury like trouble passing urine or change in the amount of urine -signs and symptoms of liver injury like dark yellow or brown urine; general ill feeling or flu-like symptoms; light-colored stools; loss of appetite; nausea; right upper belly pain; unusually weak or tired; yellowing of the eyes or skin -swelling of ankles, feet, hands Side effects that usually do not require medical attention (report to your doctor or health care professional if they continue or are bothersome): -constipation -diarrhea -hair loss -loss of appetite -nausea -rash -vomiting This list may not describe all possible side effects. Call your doctor for medical advice about side effects. You may report side effects to FDA at 1-800-FDA-1088. Where should I keep my medicine? This drug is given in a hospital or clinic and will not be stored at home. NOTE: This sheet is a summary. It may not cover all possible information. If you have questions about this medicine, talk to your doctor, pharmacist, or health care provider.  2019 Elsevier/Gold Standard (2017-05-06 18:06:11)

## 2018-07-28 ENCOUNTER — Telehealth: Payer: Self-pay | Admitting: *Deleted

## 2018-07-28 NOTE — Telephone Encounter (Signed)
Brenda [phone]).  Message left requesting a return call for chemotherapy follow up.  Awaiting return call from patient.

## 2018-07-28 NOTE — Telephone Encounter (Signed)
-----   Message from Belva Chimes, RN sent at 07/27/2018  2:41 PM EDT ----- Regarding: Magrinat 1st time Gemzar First time gemzar, patient tolerated well.

## 2018-07-30 ENCOUNTER — Ambulatory Visit
Admission: RE | Admit: 2018-07-30 | Discharge: 2018-07-30 | Disposition: A | Discharge status: Home / Self Care | Payer: Medicare Other | Source: Ambulatory Visit | Attending: Adult Health | Admitting: Adult Health

## 2018-07-30 ENCOUNTER — Other Ambulatory Visit: Payer: Self-pay

## 2018-07-30 DIAGNOSIS — Z170 Estrogen receptor positive status [ER+]: Secondary | ICD-10-CM

## 2018-07-30 DIAGNOSIS — C50412 Malignant neoplasm of upper-outer quadrant of left female breast: Secondary | ICD-10-CM

## 2018-07-30 DIAGNOSIS — C50512 Malignant neoplasm of lower-outer quadrant of left female breast: Secondary | ICD-10-CM

## 2018-07-30 DIAGNOSIS — Z171 Estrogen receptor negative status [ER-]: Secondary | ICD-10-CM

## 2018-07-30 MED ORDER — GADOBUTROL 1 MMOL/ML IV SOLN
8.0000 mL | Freq: Once | INTRAVENOUS | Status: AC | PRN
  Administered 2018-07-30: 13:00:00 8 mL via INTRAVENOUS

## 2018-08-02 ENCOUNTER — Other Ambulatory Visit: Payer: Self-pay | Admitting: General Surgery

## 2018-08-02 ENCOUNTER — Telehealth: Payer: Self-pay | Admitting: Adult Health

## 2018-08-02 ENCOUNTER — Encounter: Payer: Self-pay | Admitting: Adult Health

## 2018-08-02 DIAGNOSIS — C50812 Malignant neoplasm of overlapping sites of left female breast: Secondary | ICD-10-CM

## 2018-08-02 NOTE — Telephone Encounter (Signed)
Called Emily Chambers with good news results about her MRI.  She was happy to hear the results.  She will see Dr. Barry Dienes today.  She asked about a medication that was prescribed earlier this month that was very expensive.  She couldn't recall the name of the medication but noted she had to get paperwork to take to her pharmacy.  I reviewed her medication, and don't see anything that we have prescribed that this would fall under.  I asked Mikah to call her pharmacy and get the medication details, and follow up with [place], if there are any more concerns.  Patient voiced appreciation of call.   Wilber Bihari, NP

## 2018-08-08 ENCOUNTER — Other Ambulatory Visit: Payer: Self-pay | Admitting: Oncology

## 2018-08-10 ENCOUNTER — Other Ambulatory Visit: Payer: Self-pay | Admitting: General Surgery

## 2018-08-10 ENCOUNTER — Other Ambulatory Visit: Payer: Self-pay | Admitting: *Deleted

## 2018-08-10 DIAGNOSIS — C50512 Malignant neoplasm of lower-outer quadrant of left female breast: Secondary | ICD-10-CM

## 2018-08-10 DIAGNOSIS — R928 Other abnormal and inconclusive findings on diagnostic imaging of breast: Secondary | ICD-10-CM

## 2018-08-10 DIAGNOSIS — C50812 Malignant neoplasm of overlapping sites of left female breast: Secondary | ICD-10-CM

## 2018-08-24 ENCOUNTER — Telehealth: Payer: Self-pay | Admitting: Oncology

## 2018-08-24 NOTE — Telephone Encounter (Signed)
[facility] PAL 7/20 appointments moved to 7/29. Confirmed with patient and mailed schedule per patient request.

## 2018-08-25 ENCOUNTER — Other Ambulatory Visit: Payer: Self-pay

## 2018-08-25 ENCOUNTER — Encounter (HOSPITAL_BASED_OUTPATIENT_CLINIC_OR_DEPARTMENT_OTHER): Payer: Self-pay | Admitting: *Deleted

## 2018-08-30 ENCOUNTER — Other Ambulatory Visit (HOSPITAL_COMMUNITY)
Admission: RE | Admit: 2018-08-30 | Discharge: 2018-08-30 | Disposition: A | Discharge status: Home / Self Care | Payer: Medicare Other | Source: Ambulatory Visit | Attending: General Surgery | Admitting: General Surgery

## 2018-08-30 ENCOUNTER — Other Ambulatory Visit: Payer: Self-pay

## 2018-08-30 ENCOUNTER — Encounter (HOSPITAL_BASED_OUTPATIENT_CLINIC_OR_DEPARTMENT_OTHER)
Admission: RE | Admit: 2018-08-30 | Discharge: 2018-08-30 | Disposition: A | Discharge status: Home / Self Care | Payer: Medicare Other | Source: Ambulatory Visit | Attending: General Surgery | Admitting: General Surgery

## 2018-08-30 DIAGNOSIS — R9431 Abnormal electrocardiogram [ECG] [EKG]: Secondary | ICD-10-CM | POA: Insufficient documentation

## 2018-08-30 DIAGNOSIS — I10 Essential (primary) hypertension: Secondary | ICD-10-CM | POA: Insufficient documentation

## 2018-08-30 DIAGNOSIS — Z01818 Encounter for other preprocedural examination: Secondary | ICD-10-CM | POA: Insufficient documentation

## 2018-08-30 DIAGNOSIS — Z1159 Encounter for screening for other viral diseases: Secondary | ICD-10-CM | POA: Insufficient documentation

## 2018-08-30 LAB — SARS CORONAVIRUS 2 (TAT 6-24 HRS): SARS Coronavirus 2: NEGATIVE

## 2018-08-30 NOTE — Progress Notes (Signed)
Ensure pre surgery drink given with instructions to complete by [place], surgical soap given with instructions, pt verbalized understanding.

## 2018-08-31 ENCOUNTER — Ambulatory Visit
Admission: RE | Admit: 2018-08-31 | Discharge: 2018-08-31 | Disposition: A | Discharge status: Home / Self Care | Payer: Medicare Other | Source: Ambulatory Visit | Attending: General Surgery | Admitting: General Surgery

## 2018-08-31 DIAGNOSIS — C50812 Malignant neoplasm of overlapping sites of left female breast: Secondary | ICD-10-CM

## 2018-08-31 NOTE — Anesthesia Preprocedure Evaluation (Signed)
Anesthesia Evaluation  Patient identified by MRN, date of birth, ID band Patient awake    Reviewed: Allergy & Precautions, NPO status , Patient's Chart, lab work & pertinent test results  Airway Mallampati: II  TM Distance: >3 FB Neck ROM: Full    Dental  (+) Dental Advisory Given   Pulmonary neg pulmonary ROS,    Pulmonary exam normal breath sounds clear to auscultation       Cardiovascular hypertension, Pt. on medications negative cardio ROS Normal cardiovascular exam Rhythm:Regular Rate:Normal  Echo 1/20 Study Conclusions  - Left ventricle: The cavity size was normal. There was mild   concentric hypertrophy. Systolic function was normal. The   estimated ejection fraction was in the range of 60% to 65%. Wall   motion was normal; there were no regional wall motion   abnormalities. Doppler parameters are consistent with abnormal   left ventricular relaxation (grade 1 diastolic dysfunction).   Neuro/Psych negative neurological ROS  negative psych ROS   GI/Hepatic negative GI ROS, Neg liver ROS,   Endo/Other  negative endocrine ROS  Renal/GU Renal disease     Musculoskeletal negative musculoskeletal ROS (+)   Abdominal (+) + obese,   Peds  Hematology negative hematology ROS (+)   Anesthesia Other Findings   Reproductive/Obstetrics negative OB ROS                             Anesthesia Physical  Anesthesia Plan  ASA: III  Anesthesia Plan: General   Post-op Pain Management: GA combined w/ Regional for post-op pain   Induction: Intravenous  PONV Risk Score and Plan: 4 or greater and Ondansetron, Dexamethasone, Treatment may vary due to age or medical condition and Midazolam  Airway Management Planned: LMA  Additional Equipment: None  Intra-op Plan:   Post-operative Plan: Extubation in OR  Informed Consent: I have reviewed the patients History and Physical, chart, labs and  discussed the procedure including the risks, benefits and alternatives for the proposed anesthesia with the patient or authorized representative who has indicated his/her understanding and acceptance.     Dental advisory given  Plan Discussed with: CRNA, Anesthesiologist and Surgeon  Anesthesia Plan Comments: (Discussed both nerve block for pain relief post-op and GA; including NV, sore throat, dental injury, and pulmonary complications)        Anesthesia Quick Evaluation

## 2018-08-31 NOTE — H&P (Signed)
Emily Chambers Location: [facility] Patient #: [id number] DOB: [birth year] Undefined / Language: Cleophus Molt / Race: Black or African American Female   History of Present Illness The patient is a 68 year old female who presents for a follow-up for Breast cancer. Patient is a 68 year old female who is referred for screening detected left breast cancer 01/2018. She was found to have a 1.3 cm mass at 2:00 in the left breast and a 1.2 cm mass at 330 on the left breast. Diagnostic imaging was performed which confirmed these areas of suspicion. They were biopsied and the one at 2:00 was weakly ER positive, PR negative, and HER-2 negative. Ki-67 was 40%. The one at 330 o'clock was hormone receptor positive and HER-2 negative. The patient has breast density category C. 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 She is relatively healthy. She has had a benign breast biopsy on the left by Dr. Michiel Sites prior to his retirement. She has a several cousins with cancer of the breast on her mother side. She is not smoke cigarettes or use drugs. She drinks alcohol occasionally. She had menarche at age 60. She has not had periods since the age of 9. She is a G0. She has had a colonoscopy and a Pap smear.  Pt has undergone neoadjuvant chemotherapy. She tolerated this well. After her MRI, she required biopsy on the right. A papilloma was found. Repeat MR shows resolution of cancer.   MRI 07/30/2018. IMPRESSION: 1. Smaller enhancing mass associated with known papilloma in MEDIAL portion of the RIGHT breast. Currently mass measures 0.4 centimeters, previously 0.5 centimeters. 2. No residual enhancement adjacent to 2 tissue marker clips in the LATERAL portion of the LEFT breast at site of known malignancy. 3. No new areas of concern in either breast.  RECOMMENDATION: Treatment plan for known LEFT breast malignancy.  BI-RADS CATEGORY 6: Known biopsy-proven  malignancy.    Allergies  No Known Allergies   Allergies Reconciled   Medication History Norvasc (10MG Tablet, Oral) Active. Aspirin (81MG Tablet Chewable, Oral) Active. Losartan Potassium (100MG Tablet, Oral) Active. Pravachol (20MG Tablet, Oral) Active.    Review of Systems  All other systems negative  Vitals Weight: 185.13 lb Height: 62in Body Surface Area: 1.85 m Body Mass Index: 33.86 kg/m  Temp.: 97.79F (Temporal)  Pulse: 110 (Regular)  BP: 146/84(Sitting, Left Arm, Standard)       Physical Exam  General Mental Status-Alert. General Appearance-Consistent with stated age. Hydration-Well hydrated. Voice-Normal.  Head and Neck Head-normocephalic, atraumatic with no lesions or palpable masses.  Eye Sclera/Conjunctiva - Bilateral-No scleral icterus.  Chest and Lung Exam Chest and lung exam reveals -quiet, even and easy respiratory effort with no use of accessory muscles. Inspection Chest Wall - Normal. Back - normal.  Breast Note: no palpable masses. relatively symmetric. no nipple retraction/discharge. no LAD.   Cardiovascular Cardiovascular examination reveals -normal pedal pulses bilaterally. Note: regular rate and rhythm  Abdomen Inspection-Inspection Normal. Palpation/Percussion Palpation and Percussion of the abdomen reveal - Soft, Non Tender, No Rebound tenderness, No Rigidity (guarding) and No hepatosplenomegaly.  Peripheral Vascular Upper Extremity Inspection - Bilateral - Normal - No Clubbing, No Cyanosis, No Edema, Pulses Intact. Lower Extremity Palpation - Edema - Bilateral - No edema.  Neurologic Neurologic evaluation reveals -alert and oriented x 3 with no impairment of recent or remote memory. Mental Status-Normal.  Musculoskeletal Global Assessment -Note: no gross deformities.  Normal Exam - Left-Upper Extremity Strength Normal and Lower Extremity Strength Normal. Normal  Exam -  Right-Upper Extremity Strength Normal and Lower Extremity Strength Normal.  Lymphatic Head & Neck  General Head & Neck Lymphatics: Bilateral - Description - Normal. Axillary  General Axillary Region: Bilateral - Description - Normal. Tenderness - Non Tender.    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 Will do SLN bx on left wtih blue dye and technicium.  The surgical procedure was described to the patient. I discussed the incision type and location and that we would need radiology involved on with a wire or seed marker and/or sentinel node.  The risks and benefits of the procedure were described to the patient and she wishes to proceed.  We discussed the risks bleeding, infection, damage to other structures, need for further procedures/surgeries. We discussed the risk of seroma. The patient was advised if the area in the breast in cancer, we may need to go back to surgery for additional tissue to obtain negative margins or for a lymph node biopsy. The patient was advised that these are the most common complications, but that others can occur as well. They were advised against taking aspirin or other anti-inflammatory agents/blood thinners the week before surgery.  Current Plans Pt Education - [facility] breast cancer surgery: discussed with patient and provided information. You are being scheduled for surgery- Our schedulers will call you.  You should hear from our office's scheduling department within 5 working days about the location, date, and time of surgery. We try to make accommodations for patient's preferences in scheduling surgery, but sometimes the OR schedule or the surgeon's schedule prevents [place] from making those accommodations.  If you have not heard from our office [phone]) in 5 working days, call the office and ask for your  surgeon's nurse.  If you have other questions about your diagnosis, plan, or surgery, call the office and ask for your surgeon's nurse.    Signed by Stark Klein, MD

## 2018-09-01 ENCOUNTER — Encounter (HOSPITAL_BASED_OUTPATIENT_CLINIC_OR_DEPARTMENT_OTHER)
Admission: RE | Disposition: A | Discharge status: Home / Self Care | Payer: Self-pay | Source: Home / Self Care | Attending: General Surgery

## 2018-09-01 ENCOUNTER — Ambulatory Visit
Admission: RE | Admit: 2018-09-01 | Discharge: 2018-09-01 | Disposition: A | Discharge status: Home / Self Care | Payer: Medicare Other | Source: Ambulatory Visit | Attending: General Surgery | Admitting: General Surgery

## 2018-09-01 ENCOUNTER — Ambulatory Visit (HOSPITAL_COMMUNITY)
Admission: RE | Admit: 2018-09-01 | Discharge: 2018-09-01 | Disposition: A | Discharge status: Home / Self Care | Payer: Medicare Other | Source: Ambulatory Visit | Attending: General Surgery | Admitting: General Surgery

## 2018-09-01 ENCOUNTER — Ambulatory Visit (HOSPITAL_BASED_OUTPATIENT_CLINIC_OR_DEPARTMENT_OTHER): Payer: Medicare Other | Admitting: Anesthesiology

## 2018-09-01 ENCOUNTER — Encounter (HOSPITAL_BASED_OUTPATIENT_CLINIC_OR_DEPARTMENT_OTHER): Payer: Self-pay | Admitting: Certified Registered Nurse Anesthetist

## 2018-09-01 ENCOUNTER — Ambulatory Visit (HOSPITAL_BASED_OUTPATIENT_CLINIC_OR_DEPARTMENT_OTHER)
Admission: RE | Admit: 2018-09-01 | Discharge: 2018-09-01 | Disposition: A | Discharge status: Home / Self Care | Payer: Medicare Other | Attending: General Surgery | Admitting: General Surgery

## 2018-09-01 ENCOUNTER — Other Ambulatory Visit: Payer: Self-pay

## 2018-09-01 DIAGNOSIS — Z803 Family history of malignant neoplasm of breast: Secondary | ICD-10-CM | POA: Diagnosis not present

## 2018-09-01 DIAGNOSIS — I129 Hypertensive chronic kidney disease with stage 1 through stage 4 chronic kidney disease, or unspecified chronic kidney disease: Secondary | ICD-10-CM | POA: Diagnosis not present

## 2018-09-01 DIAGNOSIS — R928 Other abnormal and inconclusive findings on diagnostic imaging of breast: Secondary | ICD-10-CM | POA: Diagnosis present

## 2018-09-01 DIAGNOSIS — C50812 Malignant neoplasm of overlapping sites of left female breast: Secondary | ICD-10-CM

## 2018-09-01 DIAGNOSIS — Z7982 Long term (current) use of aspirin: Secondary | ICD-10-CM | POA: Diagnosis not present

## 2018-09-01 DIAGNOSIS — Z170 Estrogen receptor positive status [ER+]: Secondary | ICD-10-CM | POA: Insufficient documentation

## 2018-09-01 DIAGNOSIS — N189 Chronic kidney disease, unspecified: Secondary | ICD-10-CM | POA: Insufficient documentation

## 2018-09-01 DIAGNOSIS — Z79899 Other long term (current) drug therapy: Secondary | ICD-10-CM | POA: Insufficient documentation

## 2018-09-01 DIAGNOSIS — N6011 Diffuse cystic mastopathy of right breast: Secondary | ICD-10-CM | POA: Diagnosis not present

## 2018-09-01 HISTORY — PX: BREAST LUMPECTOMY WITH RADIOACTIVE SEED AND SENTINEL LYMPH NODE BIOPSY: SHX6550

## 2018-09-01 HISTORY — PX: RADIOACTIVE SEED GUIDED EXCISIONAL BREAST BIOPSY: SHX6490

## 2018-09-01 SURGERY — BREAST LUMPECTOMY WITH RADIOACTIVE SEED AND SENTINEL LYMPH NODE BIOPSY
Anesthesia: General | Site: Breast | Laterality: Right

## 2018-09-01 MED ORDER — ACETAMINOPHEN 160 MG/5ML PO SOLN: 325.0000 mg | ORAL | Status: DC | PRN

## 2018-09-01 MED ORDER — OXYCODONE HCL 5 MG/5ML PO SOLN: 5.0000 mg | Freq: Once | ORAL | Status: DC | PRN

## 2018-09-01 MED ORDER — GABAPENTIN 300 MG PO CAPS
300.0000 mg | ORAL_CAPSULE | ORAL | Status: AC
  Administered 2018-09-01: 300 mg via ORAL

## 2018-09-01 MED ORDER — ONDANSETRON HCL 4 MG/2ML IJ SOLN
INTRAMUSCULAR | Status: AC
  Filled 2018-09-01: qty 2

## 2018-09-01 MED ORDER — PHENYLEPHRINE 40 MCG/ML (10ML) SYRINGE FOR IV PUSH (FOR BLOOD PRESSURE SUPPORT)
PREFILLED_SYRINGE | INTRAVENOUS | Status: AC
  Filled 2018-09-01: qty 10

## 2018-09-01 MED ORDER — EPHEDRINE SULFATE-NACL 50-0.9 MG/10ML-% IV SOSY
PREFILLED_SYRINGE | INTRAVENOUS | Status: DC | PRN
  Administered 2018-09-01 (×5): 10 mg via INTRAVENOUS

## 2018-09-01 MED ORDER — FENTANYL CITRATE (PF) 100 MCG/2ML IJ SOLN
INTRAMUSCULAR | Status: AC
  Filled 2018-09-01: qty 2

## 2018-09-01 MED ORDER — ONDANSETRON HCL 4 MG/2ML IJ SOLN
INTRAMUSCULAR | Status: DC | PRN
  Administered 2018-09-01: 4 mg via INTRAVENOUS

## 2018-09-01 MED ORDER — CEFAZOLIN SODIUM-DEXTROSE 2-4 GM/100ML-% IV SOLN
INTRAVENOUS | Status: AC
  Filled 2018-09-01: qty 100

## 2018-09-01 MED ORDER — SODIUM CHLORIDE (PF) 0.9 % IJ SOLN
INTRAVENOUS | Status: DC | PRN
  Administered 2018-09-01: 5 mL via INTRAMUSCULAR

## 2018-09-01 MED ORDER — EPHEDRINE 5 MG/ML INJ
INTRAVENOUS | Status: AC
  Filled 2018-09-01: qty 10

## 2018-09-01 MED ORDER — BUPIVACAINE LIPOSOME 1.3 % IJ SUSP
INTRAMUSCULAR | Status: DC | PRN
  Administered 2018-09-01: 10 mL

## 2018-09-01 MED ORDER — MEPERIDINE HCL 25 MG/ML IJ SOLN: 6.2500 mg | INTRAMUSCULAR | Status: DC | PRN

## 2018-09-01 MED ORDER — MIDAZOLAM HCL 2 MG/2ML IJ SOLN
1.0000 mg | INTRAMUSCULAR | Status: DC | PRN
  Administered 2018-09-01: 2 mg via INTRAVENOUS

## 2018-09-01 MED ORDER — ACETAMINOPHEN 500 MG PO TABS
ORAL_TABLET | ORAL | Status: AC
  Filled 2018-09-01: qty 2

## 2018-09-01 MED ORDER — MIDAZOLAM HCL 2 MG/2ML IJ SOLN
INTRAMUSCULAR | Status: AC
  Filled 2018-09-01: qty 2

## 2018-09-01 MED ORDER — BUPIVACAINE-EPINEPHRINE (PF) 0.25% -1:200000 IJ SOLN
INTRAMUSCULAR | Status: DC | PRN
  Administered 2018-09-01: 30 mL

## 2018-09-01 MED ORDER — DEXAMETHASONE SODIUM PHOSPHATE 10 MG/ML IJ SOLN
INTRAMUSCULAR | Status: DC | PRN
  Administered 2018-09-01: 10 mg via INTRAVENOUS

## 2018-09-01 MED ORDER — PHENYLEPHRINE 40 MCG/ML (10ML) SYRINGE FOR IV PUSH (FOR BLOOD PRESSURE SUPPORT)
PREFILLED_SYRINGE | INTRAVENOUS | Status: DC | PRN
  Administered 2018-09-01: 80 ug via INTRAVENOUS
  Administered 2018-09-01: 40 ug via INTRAVENOUS
  Administered 2018-09-01 (×2): 80 ug via INTRAVENOUS
  Administered 2018-09-01: 120 ug via INTRAVENOUS
  Administered 2018-09-01 (×2): 80 ug via INTRAVENOUS

## 2018-09-01 MED ORDER — ACETAMINOPHEN 325 MG PO TABS: 325.0000 mg | ORAL_TABLET | ORAL | Status: DC | PRN

## 2018-09-01 MED ORDER — SODIUM CHLORIDE (PF) 0.9 % IJ SOLN
INTRAMUSCULAR | Status: AC
  Filled 2018-09-01: qty 10

## 2018-09-01 MED ORDER — LIDOCAINE-EPINEPHRINE (PF) 1 %-1:200000 IJ SOLN
INTRAMUSCULAR | Status: DC | PRN
  Administered 2018-09-01: 15 mL
  Administered 2018-09-01: 20 mL

## 2018-09-01 MED ORDER — LACTATED RINGERS IV SOLN
INTRAVENOUS | Status: DC
  Administered 2018-09-01 (×2): via INTRAVENOUS

## 2018-09-01 MED ORDER — PROPOFOL 10 MG/ML IV BOLUS
INTRAVENOUS | Status: DC | PRN
  Administered 2018-09-01: 100 mg via INTRAVENOUS

## 2018-09-01 MED ORDER — METHYLENE BLUE 0.5 % INJ SOLN
INTRAVENOUS | Status: AC
  Filled 2018-09-01: qty 10

## 2018-09-01 MED ORDER — CELECOXIB 200 MG PO CAPS
200.0000 mg | ORAL_CAPSULE | ORAL | Status: AC
  Administered 2018-09-01: 200 mg via ORAL

## 2018-09-01 MED ORDER — LIDOCAINE 2% (20 MG/ML) 5 ML SYRINGE
INTRAMUSCULAR | Status: DC | PRN
  Administered 2018-09-01: 60 mg via INTRAVENOUS

## 2018-09-01 MED ORDER — HEPARIN SOD (PORK) LOCK FLUSH 100 UNIT/ML IV SOLN
INTRAVENOUS | Status: AC
  Filled 2018-09-01: qty 5

## 2018-09-01 MED ORDER — SCOPOLAMINE 1 MG/3DAYS TD PT72: 1.0000 | MEDICATED_PATCH | Freq: Once | TRANSDERMAL | Status: DC

## 2018-09-01 MED ORDER — CHLORHEXIDINE GLUCONATE CLOTH 2 % EX PADS: 6.0000 | MEDICATED_PAD | Freq: Once | CUTANEOUS | Status: DC

## 2018-09-01 MED ORDER — CEFAZOLIN SODIUM-DEXTROSE 2-4 GM/100ML-% IV SOLN
2.0000 g | INTRAVENOUS | Status: AC
  Administered 2018-09-01: 2 g via INTRAVENOUS

## 2018-09-01 MED ORDER — OXYCODONE HCL 5 MG PO TABS: 5.0000 mg | ORAL_TABLET | Freq: Once | ORAL | Status: DC | PRN

## 2018-09-01 MED ORDER — DEXAMETHASONE SODIUM PHOSPHATE 10 MG/ML IJ SOLN
INTRAMUSCULAR | Status: AC
  Filled 2018-09-01: qty 1

## 2018-09-01 MED ORDER — FENTANYL CITRATE (PF) 100 MCG/2ML IJ SOLN: 25.0000 ug | INTRAMUSCULAR | Status: DC | PRN

## 2018-09-01 MED ORDER — LIDOCAINE 2% (20 MG/ML) 5 ML SYRINGE
INTRAMUSCULAR | Status: AC
  Filled 2018-09-01: qty 5

## 2018-09-01 MED ORDER — OXYCODONE HCL 5 MG PO TABS: 5.0000 mg | ORAL_TABLET | Freq: Four times a day (QID) | ORAL | 0 refills | Status: DC | PRN

## 2018-09-01 MED ORDER — PROPOFOL 500 MG/50ML IV EMUL
INTRAVENOUS | Status: AC
  Filled 2018-09-01: qty 50

## 2018-09-01 MED ORDER — BUPIVACAINE HCL (PF) 0.25 % IJ SOLN
INTRAMUSCULAR | Status: AC
  Filled 2018-09-01: qty 60

## 2018-09-01 MED ORDER — FENTANYL CITRATE (PF) 100 MCG/2ML IJ SOLN
50.0000 ug | INTRAMUSCULAR | Status: DC | PRN
  Administered 2018-09-01: 50 ug via INTRAVENOUS

## 2018-09-01 MED ORDER — CELECOXIB 200 MG PO CAPS
ORAL_CAPSULE | ORAL | Status: AC
  Filled 2018-09-01: qty 1

## 2018-09-01 MED ORDER — TECHNETIUM TC 99M SULFUR COLLOID FILTERED
1.0000 | Freq: Once | INTRAVENOUS | Status: AC | PRN
  Administered 2018-09-01: 09:00:00 1 via INTRADERMAL

## 2018-09-01 MED ORDER — ONDANSETRON HCL 4 MG/2ML IJ SOLN: 4.0000 mg | Freq: Once | INTRAMUSCULAR | Status: DC | PRN

## 2018-09-01 MED ORDER — FENTANYL CITRATE (PF) 100 MCG/2ML IJ SOLN
INTRAMUSCULAR | Status: DC | PRN
  Administered 2018-09-01: 25 ug via INTRAVENOUS

## 2018-09-01 MED ORDER — LIDOCAINE-EPINEPHRINE (PF) 1 %-1:200000 IJ SOLN
INTRAMUSCULAR | Status: AC
  Filled 2018-09-01: qty 30

## 2018-09-01 MED ORDER — HEPARIN (PORCINE) IN NACL 1000-0.9 UT/500ML-% IV SOLN
INTRAVENOUS | Status: AC
  Filled 2018-09-01: qty 500

## 2018-09-01 MED ORDER — GABAPENTIN 300 MG PO CAPS
ORAL_CAPSULE | ORAL | Status: AC
  Filled 2018-09-01: qty 1

## 2018-09-01 MED ORDER — ACETAMINOPHEN 500 MG PO TABS
1000.0000 mg | ORAL_TABLET | ORAL | Status: AC
  Administered 2018-09-01: 1000 mg via ORAL

## 2018-09-01 SURGICAL SUPPLY — 72 items
ADH SKN CLS APL DERMABOND .7 (GAUZE/BANDAGES/DRESSINGS) ×4
APL PRP STRL LF DISP 70% ISPRP (MISCELLANEOUS) ×4
BINDER BREAST LRG (GAUZE/BANDAGES/DRESSINGS) IMPLANT
BINDER BREAST MEDIUM (GAUZE/BANDAGES/DRESSINGS) IMPLANT
BINDER BREAST XLRG (GAUZE/BANDAGES/DRESSINGS) ×2 IMPLANT
BINDER BREAST XXLRG (GAUZE/BANDAGES/DRESSINGS) IMPLANT
BLADE SURG 10 STRL SS (BLADE) ×4 IMPLANT
BLADE SURG 15 STRL LF DISP TIS (BLADE) ×2 IMPLANT
BLADE SURG 15 STRL SS (BLADE) ×4
BNDG COHESIVE 4X5 TAN STRL (GAUZE/BANDAGES/DRESSINGS) ×4 IMPLANT
CANISTER SUC SOCK COL 7IN (MISCELLANEOUS) IMPLANT
CANISTER SUCT 1200ML W/VALVE (MISCELLANEOUS) ×4 IMPLANT
CHLORAPREP W/TINT 26 (MISCELLANEOUS) ×6 IMPLANT
CLIP VESOCCLUDE LG 6/CT (CLIP) ×6 IMPLANT
CLIP VESOCCLUDE MED 6/CT (CLIP) ×8 IMPLANT
CLIP VESOCCLUDE SM WIDE 6/CT (CLIP) IMPLANT
CLOSURE WOUND 1/2 X4 (GAUZE/BANDAGES/DRESSINGS) ×1
COVER BACK TABLE REUSABLE LG (DRAPES) ×4 IMPLANT
COVER MAYO STAND REUSABLE (DRAPES) ×8 IMPLANT
COVER PROBE W GEL 5X96 (DRAPES) ×4 IMPLANT
COVER WAND RF STERILE (DRAPES) IMPLANT
DECANTER SPIKE VIAL GLASS SM (MISCELLANEOUS) IMPLANT
DERMABOND ADVANCED (GAUZE/BANDAGES/DRESSINGS) ×4
DERMABOND ADVANCED .7 DNX12 (GAUZE/BANDAGES/DRESSINGS) ×2 IMPLANT
DRAPE LAPAROSCOPIC ABDOMINAL (DRAPES) ×4 IMPLANT
DRAPE UTILITY XL STRL (DRAPES) ×4 IMPLANT
DRSG PAD ABDOMINAL 8X10 ST (GAUZE/BANDAGES/DRESSINGS) ×6 IMPLANT
ELECT COATED BLADE 2.86 ST (ELECTRODE) ×4 IMPLANT
ELECT REM PT RETURN 9FT ADLT (ELECTROSURGICAL) ×4
ELECTRODE REM PT RTRN 9FT ADLT (ELECTROSURGICAL) ×2 IMPLANT
GAUZE SPONGE 4X4 12PLY STRL LF (GAUZE/BANDAGES/DRESSINGS) ×4 IMPLANT
GLOVE BIO SURGEON STRL SZ 6 (GLOVE) ×6 IMPLANT
GLOVE BIO SURGEON STRL SZ7 (GLOVE) ×2 IMPLANT
GLOVE BIOGEL PI IND STRL 6.5 (GLOVE) ×2 IMPLANT
GLOVE BIOGEL PI INDICATOR 6.5 (GLOVE) ×2
GLOVE EXAM NITRILE MD LF STRL (GLOVE) ×2 IMPLANT
GOWN STRL REUS W/ TWL LRG LVL3 (GOWN DISPOSABLE) ×2 IMPLANT
GOWN STRL REUS W/TWL 2XL LVL3 (GOWN DISPOSABLE) ×4 IMPLANT
GOWN STRL REUS W/TWL LRG LVL3 (GOWN DISPOSABLE) ×4
KIT MARKER MARGIN INK (KITS) ×4 IMPLANT
LIGHT WAVEGUIDE WIDE FLAT (MISCELLANEOUS) ×2 IMPLANT
NDL HYPO 25X1 1.5 SAFETY (NEEDLE) ×2 IMPLANT
NDL SAFETY ECLIPSE 18X1.5 (NEEDLE) ×2 IMPLANT
NEEDLE HYPO 18GX1.5 SHARP (NEEDLE)
NEEDLE HYPO 25X1 1.5 SAFETY (NEEDLE) ×8 IMPLANT
NS IRRIG 1000ML POUR BTL (IV SOLUTION) ×4 IMPLANT
PACK BASIN DAY SURGERY FS (CUSTOM PROCEDURE TRAY) ×4 IMPLANT
PACK UNIVERSAL I (CUSTOM PROCEDURE TRAY) ×4 IMPLANT
PENCIL BUTTON HOLSTER BLD 10FT (ELECTRODE) ×4 IMPLANT
SLEEVE SCD COMPRESS KNEE MED (MISCELLANEOUS) ×4 IMPLANT
SPONGE LAP 18X18 RF (DISPOSABLE) ×8 IMPLANT
STAPLER VISISTAT 35W (STAPLE) IMPLANT
STOCKINETTE IMPERVIOUS LG (DRAPES) ×4 IMPLANT
STRIP CLOSURE SKIN 1/2X4 (GAUZE/BANDAGES/DRESSINGS) ×3 IMPLANT
SUT ETHILON 2 0 FS 18 (SUTURE) IMPLANT
SUT MNCRL AB 4-0 PS2 18 (SUTURE) ×6 IMPLANT
SUT MON AB 4-0 PC3 18 (SUTURE) ×2 IMPLANT
SUT MON AB 5-0 PS2 18 (SUTURE) IMPLANT
SUT SILK 2 0 SH (SUTURE) IMPLANT
SUT VIC AB 2-0 SH 18 (SUTURE) IMPLANT
SUT VIC AB 2-0 SH 27 (SUTURE) ×8
SUT VIC AB 2-0 SH 27XBRD (SUTURE) ×2 IMPLANT
SUT VIC AB 3-0 SH 27 (SUTURE) ×4
SUT VIC AB 3-0 SH 27X BRD (SUTURE) ×2 IMPLANT
SUT VICRYL 3-0 CR8 SH (SUTURE) ×4 IMPLANT
SYR BULB 3OZ (MISCELLANEOUS) ×4 IMPLANT
SYR CONTROL 10ML LL (SYRINGE) ×6 IMPLANT
TOWEL GREEN STERILE FF (TOWEL DISPOSABLE) ×4 IMPLANT
TRAY FAXITRON CT DISP (TRAY / TRAY PROCEDURE) ×8 IMPLANT
TUBE CONNECTING 20'X1/4 (TUBING) ×1
TUBE CONNECTING 20X1/4 (TUBING) ×3 IMPLANT
YANKAUER SUCT BULB TIP NO VENT (SUCTIONS) ×4 IMPLANT

## 2018-09-01 NOTE — Anesthesia Procedure Notes (Signed)
Procedure Name: LMA Insertion Date/Time: 09/01/2018 9:19 AM Performed by: Genelle Bal, CRNA Pre-anesthesia Checklist: Patient identified, Emergency Drugs available, Suction available and Patient being monitored Patient Re-evaluated:Patient Re-evaluated prior to induction Oxygen Delivery Method: Circle system utilized Preoxygenation: Pre-oxygenation with 100% oxygen Induction Type: IV induction Ventilation: Mask ventilation without difficulty LMA: LMA inserted LMA Size: 4.0 Number of attempts: 1 Airway Equipment and Method: Bite block Placement Confirmation: positive ETCO2 Tube secured with: Tape Dental Injury: Teeth and Oropharynx as per pre-operative assessment

## 2018-09-01 NOTE — Anesthesia Procedure Notes (Signed)
Anesthesia Regional Block: Pectoralis block   Pre-Anesthetic Checklist: ,, timeout performed, Correct Patient, Correct Site, Correct Laterality, Correct Procedure, Correct Position, site marked, Risks and benefits discussed,  Surgical consent,  Pre-op evaluation,  At surgeon's request and post-op pain management  Laterality: Left  Prep: chloraprep       Needles:  Injection technique: Single-shot  Needle Type: Echogenic Stimulator Needle     Needle Length: 5cm  Needle Gauge: 22     Additional Needles:   Procedures:, nerve stimulator,,, ultrasound used (permanent image in chart),,,,   Nerve Stimulator or Paresthesia:  Response: 0.45 mA,   Additional Responses:   Narrative:  Start time: 09/01/2018 8:50 AM End time: 09/01/2018 8:55 AM Injection made incrementally with aspirations every 5 mL.  Performed by: Personally  Anesthesiologist: Janeece Riggers, MD  Additional Notes: Functioning IV was confirmed and monitors were applied.  A 31mm 22ga Arrow echogenic stimulator needle was used. Sterile prep and drape,hand hygiene and sterile gloves were used. Ultrasound guidance: relevant anatomy identified, needle position confirmed, local anesthetic spread visualized around nerve(s)., vascular puncture avoided.  Image printed for medical record. Negative aspiration and negative test dose prior to incremental administration of local anesthetic. The patient tolerated the procedure well.

## 2018-09-01 NOTE — Op Note (Signed)
Left Breast Radioactive seed localized lumpectomy x 2 and sentinel lymph node mapping and biopsy, right seed localized excisional biopsy  Indications: This patient presents with history of left breast cancer, UOQ cT1N0 grade 2, very weak +/-/-;  LOQ, cT1cN0M0, G2 +/+/-, right abnormal breast MRI, s/p neoadjuvant chemotherapy  Pre-operative Diagnosis: left breast cancer, right abnormal breast MRI  Post-operative Diagnosis: Same  Surgeon: Stark Klein   Anesthesia: General endotracheal anesthesia  ASA Class: 3  Procedure Details  The patient was seen in the Holding Room. The risks, benefits, complications, treatment options, and expected outcomes were discussed with the patient. The possibilities of bleeding, infection, the need for additional procedures, failure to diagnose a condition, and creating a complication requiring transfusion or operation were discussed with the patient. The patient concurred with the proposed plan, giving informed consent.  The site of surgery properly noted/marked. The patient was taken to Operating Room # 1, identified, and the procedure verified as left Breast seed localized Lumpectomy x 2 with SLN biopsy, right seed localized excisional biopsy. A Time Out was held and the above information confirmed.  The methylene blue was administered in the left subareolar location.    The left arm, bilateral breast, and chest were prepped and draped in standard fashion. The right side was addressed first.  The excisional biopsy  was performed by creating a medial circumareolar incision near the previously placed radioactive seed.  Dissection was carried down to around the point of maximum signal intensity. The cautery was used to perform the dissection.  Hemostasis was achieved with cautery.   The specimen was inked with the margin marker paint kit.    Specimen radiography confirmed inclusion of the mammographic lesion, the clip, and the seed.  The background signal in the breast  was zero.  The wound was irrigated and closed with 3-0 vicryl in layers and 4-0 monocryl subcuticular suture.   The left side was addressed next.  The two seeds on the left were both lateral.  A transverse incision was made laterally between the two seeds.  The 2 o'clock lesion was addressed first.  The lumpectomy was created by using cautery to go around the point of maximal signal intensity.  This cavity was irrigated and  Hemostasis was achieved.  The specimen was marked with the margin marker paint kit.  Specimen radiography confirmed the seed and clip.  The second area was taken out as well similarly and imaged.  There was only a small bridge of tissue between the two cavities, so the bridge was taken as an extra margin.  The cavity was irrigated and hemostasis was achieved with cautery.  Local was administered around the skin/cavity and large clips were placed at the borders of the lumpectomy cavity.  The skin was closed with 3-0 vicryl interrupted deep dermal sutures and 4-0 monocryl running subcuticular suture.     Using a hand-held gamma probe, left axillary sentinel nodes were identified transcutaneously.  An oblique incision was created below the axillary hairline.  Dissection was carried through the clavipectoral fascia.  Two deep level 2 axillary sentinel nodes were removed.  Counts per second were 1190 and 15.    The background count was 0 cps.  The wound was irrigated.  Hemostasis was achieved with cautery.  The axillary incision was closed with a 3-0 vicryl deep dermal interrupted sutures and a 4-0 monocryl subcuticular closure.    Sterile dressings were applied. At the end of the operation, all sponge, instrument, and needle counts were  correct.  Findings: grossly clear surgical margins and no adenopathy.  Posterior margin is pectoralis on left. Lumpectomy cavities abut each other.      Estimated Blood Loss:  min         Specimens: right breast tissue with seed (excisional biopsy), left  breast lumpectomy 2 o'clock, left breast lumpectomy 3:30 o'clock, additional margin between the two cavities, and two left axillary sentinel lymph nodes.             Complications:  None; patient tolerated the procedure well.         Disposition: PACU - hemodynamically stable.         Condition: stable

## 2018-09-01 NOTE — Progress Notes (Signed)
Assisted Dr. Oddono with left, ultrasound guided, pectoralis block. Side rails up, monitors on throughout procedure. See vital signs in flow sheet. Tolerated Procedure well. °

## 2018-09-01 NOTE — Transfer of Care (Signed)
Immediate Anesthesia Transfer of Care Note  Patient: Emily Chambers  Procedure(s) Performed: LEFT BREAST  RADIOACTIVE SEED LUMPECTOMY X2 AND LEFT SENTINEL LYMPH NODE BIOPSY AND MAPPING (Left Breast) RADIOACTIVE SEED GUIDED EXCISIONAL RIGHT BREAST BIOPSY (Right Breast)  Patient Location: PACU  Anesthesia Type:GA combined with regional for post-op pain  Level of Consciousness: sedated  Airway & Oxygen Therapy: Patient Spontanous Breathing and Patient connected to face mask oxygen  Post-op Assessment: Report given to RN and Post -op Vital signs reviewed and stable  Post vital signs: Reviewed and stable  Last Vitals:  Vitals Value Taken Time  BP 112/79 09/01/18 1115  Temp    Pulse 102 09/01/18 1116  Resp 16 09/01/18 1116  SpO2 100 % 09/01/18 1116  Vitals shown include unvalidated device data.  Last Pain:  Vitals:   09/01/18 0725  TempSrc: Oral  PainSc: 0-No pain         Complications: No apparent anesthesia complications

## 2018-09-01 NOTE — Anesthesia Postprocedure Evaluation (Signed)
Anesthesia Post Note  Patient: Emily Chambers  Procedure(s) Performed: LEFT BREAST  RADIOACTIVE SEED LUMPECTOMY X2 AND LEFT SENTINEL LYMPH NODE BIOPSY AND MAPPING (Left Breast) RADIOACTIVE SEED GUIDED EXCISIONAL RIGHT BREAST BIOPSY (Right Breast)     Patient location during evaluation: PACU Anesthesia Type: General Level of consciousness: awake and alert Pain management: pain level controlled Vital Signs Assessment: post-procedure vital signs reviewed and stable Respiratory status: spontaneous breathing, nonlabored ventilation, respiratory function stable and patient connected to nasal cannula oxygen Cardiovascular status: blood pressure returned to baseline and stable Postop Assessment: no apparent nausea or vomiting Anesthetic complications: no    Last Vitals:  Vitals:   09/01/18 1241 09/01/18 1245  BP:    Pulse: 100 (!) 101  Resp: 17 17  Temp: 37 C   SpO2: 96% 96%    Last Pain:  Vitals:   09/01/18 1241  TempSrc:   PainSc: 0-No pain                 Mohamud Mrozek

## 2018-09-01 NOTE — Interval H&P Note (Signed)
History and Physical Interval Note:  09/01/2018 8:54 AM  Emily Chambers  has presented today for surgery, with the diagnosis of LEFT BREAST CANCER, RIGHT ABNORMAL MAMMOGRAM.  The various methods of treatment have been discussed with the patient and family. After consideration of risks, benefits and other options for treatment, the patient has consented to  Procedure(s): LEFT BREAST  RADIOACTIVE SEED LUMPECTOMY X2 AND LEFT SENTINEL LYMPH NODE BIOPSY AND MAPPING (Left) RADIOACTIVE SEED GUIDED EXCISIONAL RIGHT BREAST BIOPSY (Right) as a surgical intervention.  The patient's history has been reviewed, patient examined, no change in status, stable for surgery.  I have reviewed the patient's chart and labs.  Questions were answered to the patient's satisfaction.     Stark Klein

## 2018-09-01 NOTE — Discharge Instructions (Addendum)
[facility] °Office Phone Number [phone] ° °BREAST BIOPSY/ PARTIAL MASTECTOMY: POST OP INSTRUCTIONS ° °Always review your discharge instruction sheet given to you by the facility where your surgery was performed. ° °IF YOU HAVE DISABILITY OR FAMILY LEAVE FORMS, YOU MUST BRING THEM TO THE OFFICE FOR PROCESSING.  DO NOT GIVE THEM TO YOUR DOCTOR. ° °1. A prescription for pain medication may be given to you upon discharge.  Take your pain medication as prescribed, if needed.  If narcotic pain medicine is not needed, then you may take acetaminophen (Tylenol) or ibuprofen (Advil) as needed. °2. Take your usually prescribed medications unless otherwise directed °3. If you need a refill on your pain medication, please contact your pharmacy.  They will contact our office to request authorization.  Prescriptions will not be filled after 5pm or on week-ends. °4. You should eat very light the first 24 hours after surgery, such as soup, crackers, pudding, etc.  Resume your normal diet the day after surgery. °5. Most patients will experience some swelling and bruising in the breast.  Ice packs and a good support bra will help.  Swelling and bruising can take several days to resolve.  °6. It is common to experience some constipation if taking pain medication after surgery.  Increasing fluid intake and taking a stool softener will usually help or prevent this problem from occurring.  A mild laxative (Milk of Magnesia or Miralax) should be taken according to package directions if there are no bowel movements after 48 hours. °7. Unless discharge instructions indicate otherwise, you may remove your bandages 48 hours after surgery, and you may shower at that time.  You may have steri-strips (small skin tapes) in place directly over the incision.  These strips should be left on the skin for 7-10 days.   Any sutures or staples will be removed at the office during your follow-up visit. °8. ACTIVITIES:  You may resume  regular daily activities (gradually increasing) beginning the next day.  Wearing a good support bra or sports bra (or the breast binder) minimizes pain and swelling.  You may have sexual intercourse when it is comfortable. °a. You may drive when you no longer are taking prescription pain medication, you can comfortably wear a seatbelt, and you can safely maneuver your car and apply brakes. °b. RETURN TO WORK:  __________1 week_______________ °9. You should see your doctor in the office for a follow-up appointment approximately two weeks after your surgery.  Your doctor’s nurse will typically make your follow-up appointment when she calls you with your pathology report.  Expect your pathology report 2-3 business days after your surgery.  You may call to check if you do not hear from us after three days. ° ° °WHEN TO CALL YOUR DOCTOR: °1. Fever over 101.0 °2. Nausea and/or vomiting. °3. Extreme swelling or bruising. °4. Continued bleeding from incision. °5. Increased pain, redness, or drainage from the incision. ° °The clinic staff is available to answer your questions during regular business hours.  Please don’t hesitate to call and ask to speak to one of the nurses for clinical concerns.  If you have a medical emergency, go to the nearest emergency room or call 911.  A surgeon from [facility] is always on call at the hospital. ° °For further questions, please visit [email or web address]  ° ° °Post Anesthesia Home Care Instructions ° °Activity: °Get plenty of rest for the remainder of the day. A responsible individual must stay with you for 24   hours following the procedure.  For the next 24 hours, DO NOT: -Drive a car -Paediatric nurse -Drink alcoholic beverages -Take any medication unless instructed by your physician -Make any legal decisions or sign important papers.  Meals: Start with liquid foods such as gelatin or soup. Progress to regular foods as tolerated. Avoid greasy, spicy,  heavy foods. If nausea and/or vomiting occur, drink only clear liquids until the nausea and/or vomiting subsides. Call your physician if vomiting continues.  Special Instructions/Symptoms: Your throat may feel dry or sore from the anesthesia or the breathing tube placed in your throat during surgery. If this causes discomfort, gargle with warm salt water. The discomfort should disappear within 24 hours.  If you had a scopolamine patch placed behind your ear for the management of post- operative nausea and/or vomiting:  1. The medication in the patch is effective for 72 hours, after which it should be removed.  Wrap patch in a tissue and discard in the trash. Wash hands thoroughly with soap and water. 2. You may remove the patch earlier than 72 hours if you experience unpleasant side effects which may include dry mouth, dizziness or visual disturbances. 3. Avoid touching the patch. Wash your hands with soap and water after contact with the patch.     Information for Discharge Teaching: EXPAREL (bupivacaine liposome injectable suspension)   Your surgeon or anesthesiologist gave you EXPAREL(bupivacaine) to help control your pain after surgery.   EXPAREL is a local anesthetic that provides pain relief by numbing the tissue around the surgical site.  EXPAREL is designed to release pain medication over time and can control pain for up to 72 hours.  Depending on how you respond to EXPAREL, you may require less pain medication during your recovery.  Possible side effects:  Temporary loss of sensation or ability to move in the area where bupivacaine was injected.  Nausea, vomiting, constipation  Rarely, numbness and tingling in your mouth or lips, lightheadedness, or anxiety may occur.  Call your doctor right away if you think you may be experiencing any of these sensations, or if you have other questions regarding possible side effects.  Follow all other discharge instructions given to you  by your surgeon or nurse. Eat a healthy diet and drink plenty of water or other fluids.  If you return to the hospital for any reason within 96 hours following the administration of EXPAREL, it is important for health care providers to know that you have received this anesthetic. A teal colored band has been placed on your arm with the date, time and amount of EXPAREL you have received in order to alert and inform your health care providers. Please leave this armband in place for the full 96 hours following administration, and then you may remove the band.

## 2018-09-02 ENCOUNTER — Encounter (HOSPITAL_BASED_OUTPATIENT_CLINIC_OR_DEPARTMENT_OTHER): Payer: Self-pay | Admitting: General Surgery

## 2018-09-06 NOTE — Progress Notes (Signed)
Please let patient know margins and LN are negative.  Right sided biopsy was benign.

## 2018-09-13 ENCOUNTER — Other Ambulatory Visit: Payer: Medicare Other

## 2018-09-13 ENCOUNTER — Ambulatory Visit: Payer: Medicare Other | Admitting: Oncology

## 2018-09-16 NOTE — Progress Notes (Signed)
Location of Breast Cancer: Malignant neoplasm of upper-outer quadrant of left breast in female, estrogen receptor positive ([place]). A diagnosis of Malignant neoplasm of lower-outer quadrant of left breast of female, estrogen receptor negative ([place]) was also pertinent to this visit.   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 Breast, lumpectomy, Right w/seed - FIBROCYSTIC CHANGE AND USUAL DUCTAL HYPERPLASIA. - BIOPSY SITE. - NO MALIGNANCY IDENTIFIED. 2. Breast, lumpectomy, Left @ 2 o'clock - INVASIVE DUCTAL CARCINOMA, GRADE 2, SPANNING 0.9 CM. - FOCAL HIGH GRADE DUCTAL CARCINOMA IN SITU. - MARGINS ARE NEGATIVE FOR INVASIVE CARCINOMA. - IN SITU CARCINOMA IS <1 MM FROM POSTERIOR MARGIN. - TREATMENT EFFECT. - BIOPSY SITE. - FIBROADENOMATOID CHANGE WITH CALCIFICATIONS. - INTRADUCTAL PAPILLOMA. - SEE ONCOLOGY TABLE. 3. Breast, lumpectomy, Left @ 3:30 - INVASIVE LOBULAR CARCINOMA, GRADE 2, SPANNING 1.4 CM. - LOBULAR NEOPLASIA (ATYPICAL LOBULAR HYPERPLASIA). - INVASIVE CARCINOMA IS <0.1 CM OF THE ANTERIOR MARGIN MULTIFOCALLY. - BIOPSY SITE. - SEE ONCOLOGY TABLE. 4. Breast, excision, additional Left Margin - LOBULAR NEOPLASIA (ATYPICAL LOBULAR HYPERPLASIA). - NO INVASIVE CARCINOMA.  LOWER OUTER Receptor Status: ER(90%), PR (95%), Her2-neu (negative, 1+), Ki-(5%)  UPPER OUTER Receptor Status: ER(10%), PR (0%), Her2-neu (negative, 1+), Ki-(40%)  Did patient present with symptoms (if so, please note symptoms) or was this found on screening mammography?: She had routine screening mammography on 02/10/18 showing a possible abnormality in the left breast. She underwent bilateral diagnostic mammography with tomography and left breast  ultrasonography at [facility] on 02/16/18 showing: two left breast masses, 1 at 2 o'clock and the other at 3:30 o'clock. Both masses were noted to be suspicious for carcinoma.   Accordingly on12/27/2019she proceeded to biopsy of the left breast area in question. The pathology from this procedure ([id number])showed: 1) 3:30 o'clock specimen showed invasive mammary carcinoma, with staining suggestive of a ductal phenotype, grade 2-3. Prognostic indicators significant for: estrogen receptor,90% positive and progesterone receptor,95% positive, both with strong staining intensity. Proliferation marker Ki67 at5%. HER2 negative(1+). 2) 2 o'clock specimen showed invasive ductal carcinoma, grade 2.Prognostic indicators significant for: estrogen receptor,10% positive with moderate staining intensity and progesterone receptor, 0% negative.Proliferation marker Ki67 at40%. HER2 negative (1+).  Past/Anticipated interventions by surgeon, if any: 09/01/18: Left Breast Radioactive seed localized lumpectomy x 2 and sentinel lymph node mapping and biopsy, right seed localized excisional biopsy  Indications: This patient presents with history of left breast cancer, UOQ cT1N0 grade 2, very weak +/-/-;  LOQ, cT1cN0M0, G2 +/+/-, right abnormal breast MRI, s/p neoadjuvant chemotherapy  Pre-operative Diagnosis: left breast cancer, right abnormal breast MRI  Post-operative Diagnosis: Same  Surgeon: Stark Klein    Past/Anticipated interventions by medical oncology, if any: Chemotherapy Per Dr. Jana Hakim 07/27/18:  ASSESSMENT: 68 y.o.  woman status post left breast biopsy x2 on 02/19/2018, showing             (a) in the upper outer quadrant, a clinical T1c N0, stage IA invasive carcinoma, likely lobular, grade 2, estrogen and progesterone receptor positive, HER-2 not amplified, with an MIB-1 of 5%             (b) in the lower outer quadrant a clinical T1c N0, stage IA-B invasive ductal carcinoma,  grade 2, estrogen receptor only moderately  positive at 10%, progesterone receptor negative, with an MIB-1 of 40%, and HER-2 not amplified  (1) neoadjuvant chemotherapy will consist of doxorubicin and cyclophosphamide in dose dense fashion x4 starting 03/16/2018, followed by paclitaxel and carboplatin weekly x12             (a) echo on 03/11/2018 shows well preserved EF of 60-65%  (2) definitive surgery to follow  (3) adjuvant radiation as appropriate  (4) antiestrogens to follow at the completion of local treatment (for upper outer quadrant tumor)  PLAN: 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  Accordingly I am stopping the paclitaxel today.  I will substitute Gemzar 1 dose for this last chemo treatment.  Of course she will receive carboplatin today as planned  She did remarkably well with her treatment I am hoping the peripheral neuropathy she is experiencing, which is not disabling, will in any case completely resolved in the near future  She is scheduled for breast MRI 07/30/2018 and then to see Dr. Barry Dienes to discuss those results 08/02/2018.  I would think that she will have her breast surgery sometime before the end of this month.  She will see me again 09/13/2018.  She will probably be just starting radiation treatments around that time.  We will discuss antiestrogens in detail that  Lymphedema issues, if any: Patient states she has swelling in armpit.  Pain issues, if any:  Denies any pain  SAFETY ISSUES:  Prior radiation? No  Pacemaker/ICD? No  Possible current pregnancy? No  Is the patient on methotrexate? No  Current Complaints / other details:  Patient states she has finished chemotherapy last dose was 07/27/18. No other issues at this time.    Loma Sousa, RN 09/16/2018,3:40 PM

## 2018-09-20 ENCOUNTER — Other Ambulatory Visit: Payer: Self-pay

## 2018-09-20 ENCOUNTER — Encounter: Payer: Self-pay | Admitting: Radiation Oncology

## 2018-09-20 ENCOUNTER — Ambulatory Visit
Admission: RE | Admit: 2018-09-20 | Discharge: 2018-09-20 | Disposition: A | Discharge status: Home / Self Care | Payer: Medicare Other | Source: Ambulatory Visit | Attending: Radiation Oncology | Admitting: Radiation Oncology

## 2018-09-20 VITALS — BP 136/82 | HR 97 | Temp 98.3°F | Resp 20 | Ht 62.0 in | Wt 188.4 lb

## 2018-09-20 DIAGNOSIS — C50412 Malignant neoplasm of upper-outer quadrant of left female breast: Secondary | ICD-10-CM | POA: Insufficient documentation

## 2018-09-20 DIAGNOSIS — Z171 Estrogen receptor negative status [ER-]: Secondary | ICD-10-CM

## 2018-09-20 DIAGNOSIS — Z170 Estrogen receptor positive status [ER+]: Secondary | ICD-10-CM | POA: Insufficient documentation

## 2018-09-20 DIAGNOSIS — Z7982 Long term (current) use of aspirin: Secondary | ICD-10-CM | POA: Insufficient documentation

## 2018-09-20 DIAGNOSIS — D241 Benign neoplasm of right breast: Secondary | ICD-10-CM | POA: Insufficient documentation

## 2018-09-20 DIAGNOSIS — C50512 Malignant neoplasm of lower-outer quadrant of left female breast: Secondary | ICD-10-CM

## 2018-09-20 DIAGNOSIS — Z79899 Other long term (current) drug therapy: Secondary | ICD-10-CM | POA: Insufficient documentation

## 2018-09-20 DIAGNOSIS — Z9221 Personal history of antineoplastic chemotherapy: Secondary | ICD-10-CM | POA: Diagnosis not present

## 2018-09-20 NOTE — Patient Instructions (Signed)
Coronavirus (COVID-19) Are you at risk?  Are you at risk for the Coronavirus (COVID-19)?  To be considered HIGH RISK for Coronavirus (COVID-19), you have to meet the following criteria:  . Traveled to [place], [place], [place], [place] or [place]; or in the [place] to [place], [place], [place], or [place]; and have fever, cough, and shortness of breath within the last 2 weeks of travel OR . Been in close contact with a person diagnosed with COVID-19 within the last 2 weeks and have fever, cough, and shortness of breath . IF YOU DO NOT MEET THESE CRITERIA, YOU ARE CONSIDERED LOW RISK FOR COVID-19.  What to do if you are HIGH RISK for COVID-19?  . If you are having a medical emergency, call 911. . Seek medical care right away. Before you go to a doctor's office, urgent care or emergency department, call ahead and tell them about your recent travel, contact with someone diagnosed with COVID-19, and your symptoms. You should receive instructions from your physician's office regarding next steps of care.  . When you arrive at healthcare provider, tell the healthcare staff immediately you have returned from visiting [place], [place], [place], [place] or [place]; or traveled in the [place] to [place], [place], [place], or [place]; in the last two weeks or you have been in close contact with a person diagnosed with COVID-19 in the last 2 weeks.   . Tell the health care staff about your symptoms: fever, cough and shortness of breath. . After you have been seen by a medical provider, you will be either: o Tested for (COVID-19) and discharged home on quarantine except to seek medical care if symptoms worsen, and asked to  - Stay home and avoid contact with others until you get your results (4-5 days)  - Avoid travel on public transportation if possible (such as bus, train, or airplane) or o Sent to the Emergency Department by EMS for evaluation, COVID-19 testing, and possible  admission depending on your condition and test results.  What to do if you are LOW RISK for COVID-19?  Reduce your risk of any infection by using the same precautions used for avoiding the common cold or flu:  . Wash your hands often with soap and warm water for at least 20 seconds.  If soap and water are not readily available, use an alcohol-based hand sanitizer with at least 60% alcohol.  . If coughing or sneezing, cover your mouth and nose by coughing or sneezing into the elbow areas of your shirt or coat, into a tissue or into your sleeve (not your hands). . Avoid shaking hands with others and consider head nods or verbal greetings only. . Avoid touching your eyes, nose, or mouth with unwashed hands.  . Avoid close contact with people who are sick. . Avoid places or events with large numbers of people in one location, like concerts or sporting events. . Carefully consider travel plans you have or are making. . If you are planning any travel outside or inside the [place], visit the CDC's Travelers' Health webpage for the latest health notices. . If you have some symptoms but not all symptoms, continue to monitor at home and seek medical attention if your symptoms worsen. . If you are having a medical emergency, call 911.   ADDITIONAL HEALTHCARE OPTIONS FOR PATIENTS  [facility] / e-Visit: [email or web address]         [facility]: [phone]  [facility]: [phone]                   [facility]: [phone]   

## 2018-09-20 NOTE — Progress Notes (Signed)
Radiation Oncology         [phone] ________________________________  Name: Emily Chambers MRN: [id number]  Date: 09/20/2018  DOB: Sep 25, 1950  Reevaluation Visit Note  CC: Gaynelle Arabian, MD  Magrinat, Virgie Dad, MD    ICD-10-CM   1. Malignant neoplasm of upper-outer quadrant of left breast in female, estrogen receptor positive ([place])  C50.412    Z17.0   2. 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 She was last seen in January 2020 at the [facility]. she is doing well overall. She is s/p 12 cycles of neoadjuvant paclitaxel and carboplatin, finished on 07/27/18 with Dr. Jana Hakim. She returns today following definitve surgery and chemotherapy for radiation discussion.   Since they were last seen in the office, they had bilateral lumpectomy with left sided SLN. Pathology from the procedure reported benign findings on the right side. At the 2 o'clock position on the left side was invasive ductal carcinoma, grade 2, spanning 0.9cm, along with focal high grade DCIS. Margins were negative. DCIS was <1 mm from the posterior margin. At the 3:30 position on the left side was invasive lobular carcinoma, grade 2, spanning 1.4 cm as well as lobular neoplasia. Invasive carcinoma is <0.1 cm of the anterior margin multifocally. Breast excision was performed on the left margin - lobular neoplasia was present only. Both left axillary lymph nodes were negative for carcinoma.               On review of systems, she reports she has swelling in armpit. she denies pain and any other  symptoms. Pertinent positives are listed and detailed within the above HPI.                 ALLERGIES:  has No Known Allergies.  Meds: Current Outpatient Medications  Medication Sig Dispense Refill   amLODipine (NORVASC) 10 MG tablet Take 10 mg by mouth every morning.      aspirin 81 MG chewable tablet Chew 81 mg by mouth daily.     losartan (COZAAR) 100 MG tablet Take 100 mg by mouth every morning.      potassium chloride SA (K-DUR,KLOR-CON) 20 MEQ tablet Take 1 tablet (20 mEq total) by mouth daily. 5 tablet 0   lidocaine-prilocaine (EMLA) cream Apply 1 application topically as needed. (Patient not taking: Reported on 09/20/2018) 30 g 0   LORazepam (ATIVAN) 0.5 MG tablet Take 1 tablet (0.5 mg total) by mouth every 6 (six) hours as needed (Nausea or vomiting). (Patient not taking: Reported on 09/20/2018) 30 tablet 0   oxyCODONE (OXY IR/ROXICODONE) 5 MG immediate release tablet Take 1 tablet (5 mg total) by mouth every 6 (six) hours as needed for severe pain. (Patient not taking: Reported on 09/20/2018) 15 tablet 0   pravastatin (PRAVACHOL) 20 MG tablet Take 20 mg by mouth every morning.      prochlorperazine (COMPAZINE) 10 MG tablet Take 1 tablet (10 mg total) by mouth every 6 (six) hours as needed (Nausea or vomiting). (Patient not taking: Reported on  tablet 1  ° °No current facility-administered medications for this encounter.   ° ° °Physical Findings: °The patient is in no acute distress. Patient is alert and oriented. ° height is 5' 2" (1.575 m) and weight is 188 lb 6.4 oz (85.5 kg). Her oral temperature is 98.3 °F (36.8 °C). Her blood pressure is 136/82 and her pulse is 97. Her respiration is 20 and oxygen saturation is 98%. .  Clear to auscultation.  The heart has a regular rhythm and rate.  No palpable cervical supraclavicular or axillary adenopathy.  Examination of the right breast reveals no palpable mass nipple discharge or bleeding.  Examination of the left breast  reveals well-healing lumpectomy scar.  Patient also has a scar in the axillary region which is also healing well.  No dominant mass appreciated breast nipple discharge or bleeding. ° °Lab Findings: °Lab Results  °Component Value Date  ° WBC 3.8 (L) 07/27/2018  ° HGB 10.4 (L) 07/27/2018  ° HCT 33.4 (L) 07/27/2018  ° MCV 94.6 07/27/2018  ° PLT 189 07/27/2018  ° ° °Radiographic Findings: °Nm Sentinel Node Inj-no Rpt (breast) ° °Result Date: 09/01/2018 °Sulfur colloid was injected by the nuclear medicine technologist for melanoma sentinel node.  ° °Mm Breast Surgical Specimen ° °Result Date: 09/01/2018 °CLINICAL DATA:  Two sites of invasive mammary carcinoma in the left breast. EXAM: SPECIMEN RADIOGRAPH OF THE LEFT BREAST COMPARISON:  Previous exam(s). FINDINGS: Status post excision of the left breast. The radioactive seed and biopsy marker clip (coil shaped clip) are present, completely intact, and were marked for pathology. IMPRESSION: Specimen radiograph of the left breast. Electronically Signed   By: Dina  Arceo M.D.   On: 09/01/2018 10:32  ° °Mm Breast Surgical Specimen ° °Result Date: 09/01/2018 °CLINICAL DATA:  Two areas of invasive mammary carcinoma in the left breast. EXAM: SPECIMEN RADIOGRAPH OF THE LEFT BREAST COMPARISON:  Previous exam(s). FINDINGS: Status post excision of the left breast. The radioactive seed and biopsy marker clip (ribbon shaped clip) are present, completely intact, and were marked for pathology. IMPRESSION: Specimen radiograph of the left breast. Electronically Signed   By: Dina  Arceo M.D.   On: 09/01/2018 10:31  ° °Mm Breast Surgical Specimen ° °Result Date: 09/01/2018 °CLINICAL DATA:  Right breast papilloma. EXAM: SPECIMEN RADIOGRAPH OF THE RIGHT BREAST COMPARISON:  Previous exam(s). FINDINGS: Status post excision of the right breast. The radioactive seed and biopsy marker clip are present, completely intact, and were marked for pathology. IMPRESSION: Specimen radiograph of the right breast.  Electronically Signed   By: Dina  Arceo M.D.   On: 09/01/2018 09:53  ° °Mm Lt Radioactive Seed Loc Mammo Guide ° °Result Date: 08/31/2018 °CLINICAL DATA:  Patient with 2 sites invasive carcinoma within the LEFT breast scheduled for breast conservation surgery requiring 2 preoperative radioactive seed localizations. Patient also with a RIGHT breast papilloma scheduled for surgical excision requiring preoperative radioactive seed localization. EXAM: MAMMOGRAPHIC GUIDED RADIOACTIVE SEED LOCALIZATION OF THE RIGHT BREAST MAMMOGRAPHIC GUIDED RADIOACTIVE SEED LOCALIZATION OF THE LEFT BREAST (2 sites). COMPARISON:  Previous exam(s). FINDINGS: Patient presents for radioactive seed localizations prior to breast conservation surgery the LEFT breast (2 sites of invasive carcinoma) and surgical excision of the RIGHT breast. I met with the patient and we discussed the procedure of seed localization including benefits and alternatives. We discussed the high likelihood of a successful procedure. We discussed the risks of the procedure including infection, bleeding, tissue injury and further surgery. We discussed the low dose of radioactivity involved   in the procedure. Informed, written consent was given. The usual time-out protocol was performed immediately prior to the procedure. Site 1: Using mammographic guidance, sterile technique, 1% lidocaine and an I-125 radioactive seed, the ribbon shaped clip within the inner RIGHT breast was localized using a medial approach. The follow-up mammogram images confirm the seed in the expected location and were marked for Dr. Byerly. Follow-up survey of the patient confirms presence of the radioactive seed. Order number of I-125 seed:  [id number]. Total activity:  0.250 millicuries reference Date: 08/10/2018 Site 2: Using mammographic guidance, sterile technique, 1% lidocaine and an I-125 radioactive seed, the coil shaped clip within the upper LEFT breast was localized using a lateral approach.  The follow-up mammogram images confirm the seed in the expected location and were marked for Dr. Byerly. Follow-up survey of the patient confirms presence of the radioactive seed. Order number of I-125 seed:  [id number]. Total activity:  0.250 millicuries reference Date: 08/10/2018 Site 3: Using mammographic guidance, sterile technique, 1% lidocaine and an I-125 radioactive seed, the ribbon shaped clip within the lower outer quadrant of the LEFT breast was localized using a lateral approach. The follow-up mammogram images confirm the seed in the expected location and were marked for Dr. Byerly. Follow-up survey of the patient confirms presence of the radioactive seed. Order number of I-125 seed:  [id number]. Total activity:  0.254 millicuries reference Date: 07/29/2018 The patient tolerated the procedure well and was [facility]. She was given instructions regarding seed removal. IMPRESSION: 1.  Radioactive seed localization RIGHT breast. 2.  Radioactive seed localization left breast x2 No apparent complications. Electronically Signed   By: Stan  Maynard M.D.   On: 08/31/2018 14:15  ° °Mm Lt Rad Seed Ea Add Lesion Loc Mammo ° °Result Date: 08/31/2018 °CLINICAL DATA:  Patient with 2 sites invasive carcinoma within the LEFT breast scheduled for breast conservation surgery requiring 2 preoperative radioactive seed localizations. Patient also with a RIGHT breast papilloma scheduled for surgical excision requiring preoperative radioactive seed localization. EXAM: MAMMOGRAPHIC GUIDED RADIOACTIVE SEED LOCALIZATION OF THE RIGHT BREAST MAMMOGRAPHIC GUIDED RADIOACTIVE SEED LOCALIZATION OF THE LEFT BREAST (2 sites). COMPARISON:  Previous exam(s). FINDINGS: Patient presents for radioactive seed localizations prior to breast conservation surgery the LEFT breast (2 sites of invasive carcinoma) and surgical excision of the RIGHT breast. I met with the patient and we discussed the procedure of seed localization  including benefits and alternatives. We discussed the high likelihood of a successful procedure. We discussed the risks of the procedure including infection, bleeding, tissue injury and further surgery. We discussed the low dose of radioactivity involved in the procedure. Informed, written consent was given. The usual time-out protocol was performed immediately prior to the procedure. Site 1: Using mammographic guidance, sterile technique, 1% lidocaine and an I-125 radioactive seed, the ribbon shaped clip within the inner RIGHT breast was localized using a medial approach. The follow-up mammogram images confirm the seed in the expected location and were marked for Dr. Byerly. Follow-up survey of the patient confirms presence of the radioactive seed. Order number of I-125 seed:  [id number]. Total activity:  0.250 millicuries reference Date: 08/10/2018 Site 2: Using mammographic guidance, sterile technique, 1% lidocaine and an I-125 radioactive seed, the coil shaped clip within the upper LEFT breast was localized using a lateral approach. The follow-up mammogram images confirm the seed in the expected location and were marked for Dr. Byerly. Follow-up survey of the patient confirms presence of the radioactive seed. Order number   of I-125 seed:  [id number]. Total activity:  0.250 millicuries reference Date: 08/10/2018 Site 3: Using mammographic guidance, sterile technique, 1% lidocaine and an I-125 radioactive seed, the ribbon shaped clip within the lower outer quadrant of the LEFT breast was localized using a lateral approach. The follow-up mammogram images confirm the seed in the expected location and were marked for Dr. Byerly. Follow-up survey of the patient confirms presence of the radioactive seed. Order number of I-125 seed:  [id number]. Total activity:  0.254 millicuries reference Date: 07/29/2018 The patient tolerated the procedure well and was released from [facility]. She was given instructions regarding  seed removal. IMPRESSION: 1.  Radioactive seed localization RIGHT breast. 2.  Radioactive seed localization left breast x2 No apparent complications. Electronically Signed   By: Stan  Maynard M.D.   On: 08/31/2018 14:15  ° °Mm Rt Radioactive Seed Loc Mammo Guide ° °Result Date: 08/31/2018 °CLINICAL DATA:  Patient with 2 sites invasive carcinoma within the LEFT breast scheduled for breast conservation surgery requiring 2 preoperative radioactive seed localizations. Patient also with a RIGHT breast papilloma scheduled for surgical excision requiring preoperative radioactive seed localization. EXAM: MAMMOGRAPHIC GUIDED RADIOACTIVE SEED LOCALIZATION OF THE RIGHT BREAST MAMMOGRAPHIC GUIDED RADIOACTIVE SEED LOCALIZATION OF THE LEFT BREAST (2 sites). COMPARISON:  Previous exam(s). FINDINGS: Patient presents for radioactive seed localizations prior to breast conservation surgery the LEFT breast (2 sites of invasive carcinoma) and surgical excision of the RIGHT breast. I met with the patient and we discussed the procedure of seed localization including benefits and alternatives. We discussed the high likelihood of a successful procedure. We discussed the risks of the procedure including infection, bleeding, tissue injury and further surgery. We discussed the low dose of radioactivity involved in the procedure. Informed, written consent was given. The usual time-out protocol was performed immediately prior to the procedure. Site 1: Using mammographic guidance, sterile technique, 1% lidocaine and an I-125 radioactive seed, the ribbon shaped clip within the inner RIGHT breast was localized using a medial approach. The follow-up mammogram images confirm the seed in the expected location and were marked for Dr. Byerly. Follow-up survey of the patient confirms presence of the radioactive seed. Order number of I-125 seed:  [id number]. Total activity:  0.250 millicuries reference Date: 08/10/2018 Site 2: Using mammographic guidance,  sterile technique, 1% lidocaine and an I-125 radioactive seed, the coil shaped clip within the upper LEFT breast was localized using a lateral approach. The follow-up mammogram images confirm the seed in the expected location and were marked for Dr. Byerly. Follow-up survey of the patient confirms presence of the radioactive seed. Order number of I-125 seed:  [id number]. Total activity:  0.250 millicuries reference Date: 08/10/2018 Site 3: Using mammographic guidance, sterile technique, 1% lidocaine and an I-125 radioactive seed, the ribbon shaped clip within the lower outer quadrant of the LEFT breast was localized using a lateral approach. The follow-up mammogram images confirm the seed in the expected location and were marked for Dr. Byerly. Follow-up survey of the patient confirms presence of the radioactive seed. Order number of I-125 seed:  [id number]. Total activity:  0.254 millicuries reference Date: 07/29/2018 The patient tolerated the procedure well and was released from the [facility]. She was given instructions regarding seed removal. IMPRESSION: 1.  Radioactive seed localization RIGHT breast. 2.  Radioactive seed localization left breast x2 No apparent complications. Electronically Signed   By: Stan  Maynard M.D.   On: 08/31/2018 14:15  ° ° °Impression:   Multifocal left breast cancer.    breast cancer.  Patient is now ready to proceed with radiation therapy as breast conserving treatment.  Discussed the overall treatment course side effects and potential toxicities of radiation therapy in the situation with patient.  She appears to understand and wished to proceed with radiation treatment as planned.   Plan: Patient will return on August 3 at 10 AM for CT simulation with treatments to begin approximately a week later.  Patient would appear to be a good candidate for hypofractionated accelerated radiation therapy.  ____________________________________   Blair Promise, PhD, MD    This document serves as a  record of services personally performed by Gery Pray, MD. It was created on his behalf by Mary-Margaret Loma Messing, a trained medical scribe. The creation of this record is based on the scribe's personal observations and the provider's statements to them. This document has been checked and approved by the attending provider.

## 2018-09-21 NOTE — Progress Notes (Signed)
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   Since her last visit, she underwent bilateral breast MRI on 07/30/2018. This showed: breast composition category C; smaller enhancing mass associated with known papilloma in medial portion of the right breast, currently 0.4 cm and previously 0.5 cm; no residual enhancement to 2 tissue marker clips in the lateral portion of the left breast at site of known malignancy; no new areas on concern in either breast.  She proceeded with bilateral breast lumpectomies and left sentinel lymph node biopsy on 09/01/2018. Pathology from the procedure ([id number]) showed: 1. Right Breast  - fibrocystic change and usual ductal hyperplasia, no malignancy identified 2. 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 Left Breast, at 3:30  - invasive lobular carcinoma, grade 2, spanning 1.4 cm  - lobular neoplasia  - ER+ /PR+ /Her2- 4. Left Axillary Lymph Nodes (2)  - negative for carcinoma (0/2)  She met with radiation oncology on 09/20/2018 and tells me she is planning  for 2 4-1/2 weeks of treatment to start mid August  REVIEW OF SYSTEMS: Raechal did very well with her surgery, with no unusual bleeding, redness, or dehiscence.  The left breast is still little swollen she says.  It is a little tender particularly in the axilla.  Otherwise a detailed review of systems today was noncontributory   HISTORY OF CURRENT ILLNESS: From the original intake note:  CORRINNA KARAPETYAN had routine screening mammography on 02/10/2018 showing a possible abnormality in the left breast. She underwent bilateral diagnostic mammography with tomography and left breast ultrasonography at [facility] on 02/16/2018 showing: breast density category C; two left breast masses, one at 2 o'clock and the other at 3:30 o'clock. The 2 o'clock mass (1.3 x 1 x 1 cm) corresponds to the mammographic abnormality and is highly suspicious for breast carcinoma. The other mass (1.2 x 0.7 x 1.2 cm)  is suspicious for breast carcinoma. No left axillary adenopathy.   Accordingly on 02/19/2018 she proceeded to biopsy of the left breast area in question. The pathology from this procedure ([id number]) showed:  1) 3:30 o'clock specimen showed invasive mammary carcinoma, possibly lobular (weak and atypical e-cadherin expression), grade 2. Prognostic indicators significant for: estrogen receptor, 90% positive and progesterone receptor, 95% positive, both with strong staining intensity. Proliferation marker Ki67 at 5%. HER2 negative (1+).   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 Prognostic indicators significant for: estrogen receptor, 10% positive with moderate staining intensity and progesterone receptor, 0% negative. Proliferation marker Ki67 at 40%. HER2 negative (1+).   The patient's subsequent history is as detailed below.   PAST MEDICAL HISTORY: Past Medical History:  Diagnosis Date  Family history of breast cancer    Family history of lung cancer    Family history of  throat cancer    High cholesterol    Hypertension    Kidney stone    Kidney stones    UTI (lower urinary tract infection)     PAST SURGICAL HISTORY: Past Surgical History:  Procedure Laterality Date   BREAST EXCISIONAL BIOPSY Left 1999,2005   cysts removed   BREAST LUMPECTOMY WITH RADIOACTIVE SEED AND SENTINEL LYMPH NODE BIOPSY Left 09/01/2018   Procedure: LEFT BREAST  RADIOACTIVE SEED LUMPECTOMY X2 AND LEFT SENTINEL LYMPH NODE BIOPSY AND MAPPING;  Surgeon: Stark Klein, MD;  Location: [facility];  Service: General;  Laterality: Left;   BREAST SURGERY Left    cyst and biopsy   PORTACATH PLACEMENT Left 03/10/2018   Procedure: INSERTION PORT-A-CATH;  Surgeon: Stark Klein, MD;  Location: [facility];  Service: General;  Laterality: Left;   RADIOACTIVE SEED GUIDED EXCISIONAL BREAST BIOPSY Right 09/01/2018   Procedure: RADIOACTIVE SEED GUIDED EXCISIONAL RIGHT BREAST BIOPSY;  Surgeon: Stark Klein, MD;  Location: [facility];  Service: General;  Laterality: Right;    FAMILY HISTORY: Family History  Problem Relation Age of Onset   Hypertension Mother    Heart disease Mother    Dementia Mother    Healthy Father    Colon cancer Maternal Grandmother    Cancer Paternal Grandmother        unk type   Lung cancer Cousin        56s   Breast cancer Cousin        60s   Breast cancer Cousin        5s   Breast cancer Cousin    Cancer Paternal Aunt        unk type d. 63, possibly pancreatic   Cancer Paternal Aunt        unk type d. 79s   Cancer Paternal Uncle        unk type    Cancer Paternal Uncle        unk type d. 52s   Cancer Maternal Aunt        unk type, d. 58s   Throat cancer Maternal Uncle        d. 36s   Ovarian cancer Neg Hx    Patient father is alive at 21 years old. Patient mother died from heart disease and dementia at age 35.  The patient denies a family hx of ovarian cancer. She has 3 siblings, 1  brother and 2 sisters. She has a maternal cousin diagnosed with lung cancer in her 65s. She has 3 paternal cousins with breast cancer, one was diagnosed in her 43s and has passed away.   GYNECOLOGIC HISTORY:  No LMP recorded. Patient is postmenopausal. Menarche: 68 years old Age at first live birth: n/a GX P 0 LMP 2010 Contraceptive n/a HRT no  Hysterectomy? no BSo? no   SOCIAL HISTORY: She is single and works as a Scientist, water quality at [employer]). She lives alone, with no pets.    ADVANCED DIRECTIVES: Not in place.  At the 03/03/2018 visit she was given the appropriate documents to complete and notarize at her discretion.  She is planning to name her niece, [name], as her HCPOA.   HEALTH MAINTENANCE: Social History   Tobacco Use   Smoking status: Never Smoker   Smokeless tobacco: Never Used  Substance Use Topics  Alcohol use: Yes    Comment: seldom   Drug use: No     Colonoscopy: 2014? [facility]  PAP: 02/02/2018  Bone density: 03/15/2016, T-score 0.4, Dr. Alden Hipp   No Known Allergies  Current Outpatient Medications  Medication Sig Dispense Refill   amLODipine (NORVASC) 10 MG tablet Take 10 mg by mouth every morning.      aspirin 81 MG chewable tablet Chew 81 mg by mouth daily.     lidocaine-prilocaine (EMLA) cream Apply 1 application topically as needed. (Patient not taking: Reported on 09/20/2018) 30 g 0   LORazepam (ATIVAN) 0.5 MG tablet Take 1 tablet (0.5 mg total) by mouth every 6 (six) hours as needed (Nausea or vomiting). (Patient not taking: Reported on 09/20/2018) 30 tablet 0   losartan (COZAAR) 100 MG tablet Take 100 mg by mouth every morning.      oxyCODONE (OXY IR/ROXICODONE) 5 MG immediate release tablet Take 1 tablet (5 mg total) by mouth every 6 (six) hours as needed for severe pain. (Patient not taking: Reported on 09/20/2018) 15 tablet 0   potassium chloride SA (K-DUR,KLOR-CON) 20 MEQ tablet Take 1 tablet  (20 mEq total) by mouth daily. 5 tablet 0   pravastatin (PRAVACHOL) 20 MG tablet Take 20 mg by mouth every morning.      prochlorperazine (COMPAZINE) 10 MG tablet Take 1 tablet (10 mg total) by mouth every 6 (six) hours as needed (Nausea or vomiting). (Patient not taking: Reported on 09/20/2018) 30 tablet 1   No current facility-administered medications for this visit.     OBJECTIVE: Middle-aged African-American woman who appears stated age  47:   09/22/18 1007  BP: 128/77  Pulse: 81  Resp: 18  Temp: 98.3 F (36.8 C)  SpO2: 99%     Body mass index is 34.07 kg/m.   Wt Readings from Last 3 Encounters:  09/22/18 186 lb 4.8 oz (84.5 kg)  09/20/18 188 lb 6.4 oz (85.5 kg)  09/01/18 182 lb 5.1 oz (82.7 kg)  ECOG FS:1  Sclerae unicteric, EOMs intact Wearing a mask No cervical or supraclavicular adenopathy Lungs no rales or rhonchi Heart regular rate and rhythm Abd soft, nontender, positive bowel sounds MSK no focal spinal tenderness, no upper extremity lymphedema Neuro: nonfocal, well oriented, appropriate affect Breasts: The right breast is benign.  The left breast is status post recent lumpectomy.  The incisions are healing very nicely.  The cosmetic result is excellent.  The left breast is somewhat swollen, but not erythematous.  Both axillae are benign.   LAB RESULTS:  CMP     Component Value Date/Time   NA 144 07/27/2018 1030   K 3.5 07/27/2018 1030   CL 108 07/27/2018 1030   CO2 25 07/27/2018 1030   GLUCOSE 170 (H) 07/27/2018 1030   BUN 17 07/27/2018 1030   CREATININE 0.74 07/27/2018 1030   CALCIUM 9.6 07/27/2018 1030   PROT 7.0 07/27/2018 1030   ALBUMIN 3.9 07/27/2018 1030   AST 19 07/27/2018 1030   ALT 30 07/27/2018 1030   ALKPHOS 84 07/27/2018 1030   BILITOT 0.2 (L) 07/27/2018 1030   GFRNONAA >60 07/27/2018 1030   GFRAA >60 07/27/2018 1030    No results found for: TOTALPROTELP, ALBUMINELP, A1GS, A2GS, BETS, BETA2SER, GAMS, MSPIKE, SPEI  No results  found for: KPAFRELGTCHN, LAMBDASER, KAPLAMBRATIO  Lab Results  Component Value Date   WBC 8.0 09/22/2018   NEUTROABS 4.3 09/22/2018   HGB 10.0 (L) 09/22/2018   HCT 32.1 (L) 09/22/2018  MCV 93.3 09/22/2018   PLT 166 09/22/2018    '@LASTCHEMISTRY'$ @  No results found for: LABCA2  No components found for: [id number]  No results for input(s): INR in the last 168 hours.  No results found for: LABCA2  No results found for: TDD220  No results found for: URK270  No results found for: WCB762  No results found for: CA2729  No components found for: HGQUANT  No results found for: CEA1 / No results found for: CEA1   No results found for: AFPTUMOR  No results found for: CHROMOGRNA  No results found for: PSA1  Appointment on 09/22/2018  Component Date Value Ref Range Status   WBC Count 09/22/2018 8.0  4.0 - 10.5 K/uL Final   RBC 09/22/2018 3.44* 3.87 - 5.11 MIL/uL Final   Hemoglobin 09/22/2018 10.0* 12.0 - 15.0 g/dL Final   HCT 09/22/2018 32.1* 36.0 - 46.0 % Final   MCV 09/22/2018 93.3  80.0 - 100.0 fL Final   MCH 09/22/2018 29.1  26.0 - 34.0 pg Final   MCHC 09/22/2018 31.2  30.0 - 36.0 g/dL Final   RDW 09/22/2018 14.6  11.5 - 15.5 % Final   Platelet Count 09/22/2018 166  150 - 400 K/uL Final   nRBC 09/22/2018 0.0  0.0 - 0.2 % Final   Neutrophils Relative % 09/22/2018 52  % Final   Neutro Abs 09/22/2018 4.3  1.7 - 7.7 K/uL Final   Lymphocytes Relative 09/22/2018 27  % Final   Lymphs Abs 09/22/2018 2.2  0.7 - 4.0 K/uL Final   Monocytes Relative 09/22/2018 7  % Final   Monocytes Absolute 09/22/2018 0.5  0.1 - 1.0 K/uL Final   Eosinophils Relative 09/22/2018 12  % Final   Eosinophils Absolute 09/22/2018 0.9* 0.0 - 0.5 K/uL Final   Basophils Relative 09/22/2018 1  % Final   Basophils Absolute 09/22/2018 0.0  0.0 - 0.1 K/uL Final   Immature Granulocytes 09/22/2018 1  % Final   Abs Immature Granulocytes 09/22/2018 0.04  0.00 - 0.07 K/uL Final   Performed  at [facility], [address] [place], [place] [address]    (this displays the last labs from the last 3 days)  No results found for: TOTALPROTELP, ALBUMINELP, A1GS, A2GS, BETS, BETA2SER, GAMS, MSPIKE, SPEI (this displays SPEP labs)  No results found for: KPAFRELGTCHN, LAMBDASER, KAPLAMBRATIO (kappa/lambda light chains)  No results found for: HGBA, HGBA2QUANT, HGBFQUANT, HGBSQUAN (Hemoglobinopathy evaluation)   No results found for: LDH  No results found for: IRON, TIBC, IRONPCTSAT (Iron and TIBC)  No results found for: FERRITIN  Urinalysis    Component Value Date/Time   COLORURINE YELLOW 04/12/2018 1504   APPEARANCEUR CLEAR 04/12/2018 1504   LABSPEC 1.020 04/12/2018 1504   PHURINE 5.0 04/12/2018 1504   GLUCOSEU NEGATIVE 04/12/2018 1504   HGBUR NEGATIVE 04/12/2018 1504   BILIRUBINUR NEGATIVE 04/12/2018 1504   KETONESUR NEGATIVE 04/12/2018 1504   PROTEINUR NEGATIVE 04/12/2018 1504   UROBILINOGEN 0.2 07/07/2012 1159   NITRITE NEGATIVE 04/12/2018 1504   LEUKOCYTESUR TRACE (A) 04/12/2018 1504     STUDIES: Nm Sentinel Node Inj-no Rpt (breast)  Result Date: 09/01/2018 Sulfur colloid was injected by the nuclear medicine technologist for melanoma sentinel node.   Mm Breast Surgical Specimen  Result Date: 09/01/2018 CLINICAL DATA:  Two sites of invasive mammary carcinoma in the left breast. EXAM: SPECIMEN RADIOGRAPH OF THE LEFT BREAST COMPARISON:  Previous exam(s). FINDINGS: Status post excision of the left breast. The radioactive seed and biopsy marker  clip (coil shaped clip) are present, completely intact, and were marked for pathology. IMPRESSION: Specimen radiograph of the left breast. Electronically Signed   By: Lillia Mountain M.D.   On: 09/01/2018 10:32   Mm Breast Surgical Specimen  Result Date: 09/01/2018 CLINICAL DATA:  Two areas of invasive mammary carcinoma in the left breast. EXAM: SPECIMEN RADIOGRAPH OF THE LEFT BREAST COMPARISON:  Previous  exam(s). FINDINGS: Status post excision of the left breast. The radioactive seed and biopsy marker clip (ribbon shaped clip) are present, completely intact, and were marked for pathology. IMPRESSION: Specimen radiograph of the left breast. Electronically Signed   By: Lillia Mountain M.D.   On: 09/01/2018 10:31   Mm Breast Surgical Specimen  Result Date: 09/01/2018 CLINICAL DATA:  Right breast papilloma. EXAM: SPECIMEN RADIOGRAPH OF THE RIGHT BREAST COMPARISON:  Previous exam(s). FINDINGS: Status post excision of the right breast. The radioactive seed and biopsy marker clip are present, completely intact, and were marked for pathology. IMPRESSION: Specimen radiograph of the right breast. Electronically Signed   By: Lillia Mountain M.D.   On: 09/01/2018 09:53   Mm Lt Radioactive Seed Loc Mammo Guide  Result Date: 08/31/2018 CLINICAL DATA:  Patient with 2 sites invasive carcinoma within the LEFT breast scheduled for breast conservation surgery requiring 2 preoperative radioactive seed localizations. Patient also with a RIGHT breast papilloma scheduled for surgical excision requiring preoperative radioactive seed localization. EXAM: MAMMOGRAPHIC GUIDED RADIOACTIVE SEED LOCALIZATION OF THE RIGHT BREAST MAMMOGRAPHIC GUIDED RADIOACTIVE SEED LOCALIZATION OF THE LEFT BREAST (2 sites). COMPARISON:  Previous exam(s). FINDINGS: Patient presents for radioactive seed localizations prior to breast conservation surgery the LEFT breast (2 sites of invasive carcinoma) and surgical excision of the RIGHT breast. I met with the patient and we discussed the procedure of seed localization including benefits and alternatives. We discussed the high likelihood of a successful procedure. We discussed the risks of the procedure including infection, bleeding, tissue injury and further surgery. We discussed the low dose of radioactivity involved in the procedure. Informed, written consent was given. The usual time-out protocol was performed  immediately prior to the procedure. Site 1: Using mammographic guidance, sterile technique, 1% lidocaine and an I-125 radioactive seed, the ribbon shaped clip within the inner RIGHT breast was localized using a medial approach. The follow-up mammogram images confirm the seed in the expected location and were marked for Dr. Barry Dienes. Follow-up survey of the patient confirms presence of the radioactive seed. Order number of I-125 seed:  [id number]. Total activity:  1.779 millicuries reference Date: 08/10/2018 Site 2: Using mammographic guidance, sterile technique, 1% lidocaine and an I-125 radioactive seed, the coil shaped clip within the upper LEFT breast was localized using a lateral approach. The follow-up mammogram images confirm the seed in the expected location and were marked for Dr. Barry Dienes. Follow-up survey of the patient confirms presence of the radioactive seed. Order number of I-125 seed:  [id number]. Total activity:  3.007 millicuries reference Date: 08/10/2018 Site 3: Using mammographic guidance, sterile technique, 1% lidocaine and an I-125 radioactive seed, the ribbon shaped clip within the lower outer quadrant of the LEFT breast was localized using a lateral approach. The follow-up mammogram images confirm the seed in the expected location and were marked for Dr. Barry Dienes. Follow-up survey of the patient confirms presence of the radioactive seed. Order number of I-125 seed:  [id number]. Total activity:  5.625 millicuries reference Date: 07/29/2018 The patient tolerated the procedure well and was released from the [facility]. She was given instructions  regarding seed removal. IMPRESSION: 1.  Radioactive seed localization RIGHT breast. 2.  Radioactive seed localization left breast x2 No apparent complications. Electronically Signed   By: Franki Cabot M.D.   On: 08/31/2018 14:15   Mm Lt Rad Seed Ea Add Lesion Loc Mammo  Result Date: 08/31/2018 CLINICAL DATA:  Patient with 2 sites invasive carcinoma within  the LEFT breast scheduled for breast conservation surgery requiring 2 preoperative radioactive seed localizations. Patient also with a RIGHT breast papilloma scheduled for surgical excision requiring preoperative radioactive seed localization. EXAM: MAMMOGRAPHIC GUIDED RADIOACTIVE SEED LOCALIZATION OF THE RIGHT BREAST MAMMOGRAPHIC GUIDED RADIOACTIVE SEED LOCALIZATION OF THE LEFT BREAST (2 sites). COMPARISON:  Previous exam(s). FINDINGS: Patient presents for radioactive seed localizations prior to breast conservation surgery the LEFT breast (2 sites of invasive carcinoma) and surgical excision of the RIGHT breast. I met with the patient and we discussed the procedure of seed localization including benefits and alternatives. We discussed the high likelihood of a successful procedure. We discussed the risks of the procedure including infection, bleeding, tissue injury and further surgery. We discussed the low dose of radioactivity involved in the procedure. Informed, written consent was given. The usual time-out protocol was performed immediately prior to the procedure. Site 1: Using mammographic guidance, sterile technique, 1% lidocaine and an I-125 radioactive seed, the ribbon shaped clip within the inner RIGHT breast was localized using a medial approach. The follow-up mammogram images confirm the seed in the expected location and were marked for Dr. Barry Dienes. Follow-up survey of the patient confirms presence of the radioactive seed. Order number of I-125 seed:  [id number]. Total activity:  1.791 millicuries reference Date: 08/10/2018 Site 2: Using mammographic guidance, sterile technique, 1% lidocaine and an I-125 radioactive seed, the coil shaped clip within the upper LEFT breast was localized using a lateral approach. The follow-up mammogram images confirm the seed in the expected location and were marked for Dr. Barry Dienes. Follow-up survey of the patient confirms presence of the radioactive seed. Order number of I-125  seed:  [id number]. Total activity:  0.165 millicuries reference Date: 08/10/2018 Site 3: Using mammographic guidance, sterile technique, 1% lidocaine and an I-125 radioactive seed, the ribbon shaped clip within the lower outer quadrant of the LEFT breast was localized using a lateral approach. The follow-up mammogram images confirm the seed in the expected location and were marked for Dr. Barry Dienes. Follow-up survey of the patient confirms presence of the radioactive seed. Order number of I-125 seed:  [id number]. Total activity:  8.675 millicuries reference Date: 07/29/2018 The patient tolerated the procedure well and was released from the [facility]. She was given instructions regarding seed removal. IMPRESSION: 1.  Radioactive seed localization RIGHT breast. 2.  Radioactive seed localization left breast x2 No apparent complications. Electronically Signed   By: Franki Cabot M.D.   On: 08/31/2018 14:15   Mm Rt Radioactive Seed Loc Mammo Guide  Result Date: 08/31/2018 CLINICAL DATA:  Patient with 2 sites invasive carcinoma within the LEFT breast scheduled for breast conservation surgery requiring 2 preoperative radioactive seed localizations. Patient also with a RIGHT breast papilloma scheduled for surgical excision requiring preoperative radioactive seed localization. EXAM: MAMMOGRAPHIC GUIDED RADIOACTIVE SEED LOCALIZATION OF THE RIGHT BREAST MAMMOGRAPHIC GUIDED RADIOACTIVE SEED LOCALIZATION OF THE LEFT BREAST (2 sites). COMPARISON:  Previous exam(s). FINDINGS: Patient presents for radioactive seed localizations prior to breast conservation surgery the LEFT breast (2 sites of invasive carcinoma) and surgical excision of the RIGHT breast. I met with the patient and we discussed the  procedure of seed localization including benefits and alternatives. We discussed the high likelihood of a successful procedure. We discussed the risks of the procedure including infection, bleeding, tissue injury and further surgery. We  discussed the low dose of radioactivity involved in the procedure. Informed, written consent was given. The usual time-out protocol was performed immediately prior to the procedure. Site 1: Using mammographic guidance, sterile technique, 1% lidocaine and an I-125 radioactive seed, the ribbon shaped clip within the inner RIGHT breast was localized using a medial approach. The follow-up mammogram images confirm the seed in the expected location and were marked for Dr. Barry Dienes. Follow-up survey of the patient confirms presence of the radioactive seed. Order number of I-125 seed:  [id number]. Total activity:  2.010 millicuries reference Date: 08/10/2018 Site 2: Using mammographic guidance, sterile technique, 1% lidocaine and an I-125 radioactive seed, the coil shaped clip within the upper LEFT breast was localized using a lateral approach. The follow-up mammogram images confirm the seed in the expected location and were marked for Dr. Barry Dienes. Follow-up survey of the patient confirms presence of the radioactive seed. Order number of I-125 seed:  [id number]. Total activity:  8.325 millicuries reference Date: 08/10/2018 Site 3: Using mammographic guidance, sterile technique, 1% lidocaine and an I-125 radioactive seed, the ribbon shaped clip within the lower outer quadrant of the LEFT breast was localized using a lateral approach. The follow-up mammogram images confirm the seed in the expected location and were marked for Dr. Barry Dienes. Follow-up survey of the patient confirms presence of the radioactive seed. Order number of I-125 seed:  [id number]. Total activity:  3.094 millicuries reference Date: 07/29/2018 The patient tolerated the procedure well and was released from the [facility]. She was given instructions regarding seed removal. IMPRESSION: 1.  Radioactive seed localization RIGHT breast. 2.  Radioactive seed localization left breast x2 No apparent complications. Electronically Signed   By: Franki Cabot M.D.   On:  08/31/2018 14:15     ELIGIBLE FOR AVAILABLE RESEARCH PROTOCOL: Upbeat   ASSESSMENT: 68 y.o. [place] woman status post left breast biopsy x2 on 02/19/2018, showing  (a) in the upper outer quadrant, a clinical T1c N0, stage IA invasive carcinoma, likely lobular, grade 2, estrogen and progesterone receptor positive, HER-2 not amplified, with an MIB-1 of 5%  (b) in the lower outer quadrant a clinical T1c N0, stage IA-B invasive ductal carcinoma, grade 2, estrogen receptor only moderately positive at 10%, progesterone receptor negative, with an MIB-1 of 40%, and HER-2 not amplified  (1) neoadjuvant chemotherapy will consist of doxorubicin and cyclophosphamide in dose dense fashion x4 starting 03/16/2018, followed by paclitaxel and carboplatin weekly x12  (a) echo on 03/11/2018 shows well preserved EF of 60-65%  (2) status post left lumpectomy and sentinel lymph node sampling 09/01/2018 showing  (a) invasive ductal carcinoma, grade 2, pT1b pN0, triple negative  (b) invasive lobular carcinoma, grade 2, pT1c pN0, estrogen and progesterone receptor positive, HER-2 not amplified  (3) adjuvant radiation pending  (4) antiestrogens to follow at the completion of local treatment (for upper outer quadrant tumor)  (5) genetics testing on 03/24/2018 showed no pathogenic mutations.  Genes tested include:  APC, ATM, AXIN2, BARD1, BMPR1A, BRCA1, BRCA2, BRIP1, CDH1, CDKN2A (p14ARF), CDKN2A (p16INK4a), CKD4, CHEK2, CTNNA1, DICER1, EPCAM (Deletion/duplication testing only), GREM1 (promoter region deletion/duplication testing only), KIT, MEN1, MLH1, MSH2, MSH3, MSH6, MUTYH, NBN, NF1, NHTL1, PALB2, PDGFRA, PMS2, POLD1, POLE, PTEN, RAD50, RAD51C, RAD51D, SDHB, SDHC, SDHD, SMAD4, SMARCA4. STK11, TP53, TSC1, TSC2, and VHL.  The  following genes were evaluated for sequence changes only: SDHA and HOXB13 c.251G>A variant only.   PLAN: Carina tolerated her chemotherapy well and has had a good response in terms of her  surgery.  She is very pleased with the cosmetic result.  She is now ready to proceed to radiation.  We discussed her pathology report in detail so she understands the complexity of her situation, with 2 separate breast cancers in the same breast  She would like to return to work as soon as radiation is completed and I am writing her a note stating that.  She will return to see me in October.  At that time we will discuss in more detail which are those agents she will be on for the next 5 years.  She knows to call for any other issues that may develop before the next visit.      Virgie Dad. Jetaime Pinnix, MD  09/22/18 10:41 AM Medical Oncology and Hematology [facility] [address] [place], [place] [address] Tel. [phone]    Fax. [phone]   I, Wilburn Mylar, am acting as scribe for Dr. Virgie Dad. Crystelle Ferrufino.  I, Lurline Del MD, have reviewed the above documentation for accuracy and completeness, and I agree with the above.

## 2018-09-22 ENCOUNTER — Inpatient Hospital Stay: Payer: Medicare Other | Attending: Oncology

## 2018-09-22 ENCOUNTER — Other Ambulatory Visit: Payer: Self-pay

## 2018-09-22 ENCOUNTER — Inpatient Hospital Stay (HOSPITAL_BASED_OUTPATIENT_CLINIC_OR_DEPARTMENT_OTHER): Payer: Medicare Other | Admitting: Oncology

## 2018-09-22 ENCOUNTER — Encounter: Payer: Self-pay | Admitting: Oncology

## 2018-09-22 VITALS — BP 128/77 | HR 81 | Temp 98.3°F | Resp 18 | Ht 62.0 in | Wt 186.3 lb

## 2018-09-22 DIAGNOSIS — C50412 Malignant neoplasm of upper-outer quadrant of left female breast: Secondary | ICD-10-CM | POA: Insufficient documentation

## 2018-09-22 DIAGNOSIS — Z79899 Other long term (current) drug therapy: Secondary | ICD-10-CM

## 2018-09-22 DIAGNOSIS — Z170 Estrogen receptor positive status [ER+]: Secondary | ICD-10-CM | POA: Insufficient documentation

## 2018-09-22 DIAGNOSIS — Z801 Family history of malignant neoplasm of trachea, bronchus and lung: Secondary | ICD-10-CM

## 2018-09-22 DIAGNOSIS — I10 Essential (primary) hypertension: Secondary | ICD-10-CM | POA: Insufficient documentation

## 2018-09-22 DIAGNOSIS — Z803 Family history of malignant neoplasm of breast: Secondary | ICD-10-CM | POA: Insufficient documentation

## 2018-09-22 DIAGNOSIS — Z7982 Long term (current) use of aspirin: Secondary | ICD-10-CM

## 2018-09-22 DIAGNOSIS — C50512 Malignant neoplasm of lower-outer quadrant of left female breast: Secondary | ICD-10-CM

## 2018-09-22 DIAGNOSIS — Z171 Estrogen receptor negative status [ER-]: Secondary | ICD-10-CM

## 2018-09-22 LAB — CMP (CANCER CENTER ONLY)
ALT: 21 U/L (ref 0–44)
AST: 18 U/L (ref 15–41)
Albumin: 3.7 g/dL (ref 3.5–5.0)
Alkaline Phosphatase: 90 U/L (ref 38–126)
Anion gap: 12 (ref 5–15)
BUN: 15 mg/dL (ref 8–23)
CO2: 23 mmol/L (ref 22–32)
Calcium: 9.8 mg/dL (ref 8.9–10.3)
Chloride: 108 mmol/L (ref 98–111)
Creatinine: 0.78 mg/dL (ref 0.44–1.00)
GFR, Est AFR Am: >60 mL/min (ref >60)
GFR, Estimated: >60 mL/min (ref >60)
Glucose, Bld: 124 mg/dL — ABNORMAL HIGH (ref 70–99)
Potassium: 3.3 mmol/L — ABNORMAL LOW (ref 3.5–5.1)
Sodium: 143 mmol/L (ref 135–145)
Total Bilirubin: 0.4 mg/dL (ref 0.3–1.2)
Total Protein: 7.3 g/dL (ref 6.5–8.1)

## 2018-09-22 LAB — CBC WITH DIFFERENTIAL (CANCER CENTER ONLY)
Abs Immature Granulocytes: 0.04 10*3/uL (ref 0.00–0.07)
Basophils Absolute: 0 10*3/uL (ref 0.0–0.1)
Basophils Relative: 1 %
Eosinophils Absolute: 0.9 10*3/uL — ABNORMAL HIGH (ref 0.0–0.5)
Eosinophils Relative: 12 %
HCT: 32.1 % — ABNORMAL LOW (ref 36.0–46.0)
Hemoglobin: 10 g/dL — ABNORMAL LOW (ref 12.0–15.0)
Immature Granulocytes: 1 %
Lymphocytes Relative: 27 %
Lymphs Abs: 2.2 10*3/uL (ref 0.7–4.0)
MCH: 29.1 pg (ref 26.0–34.0)
MCHC: 31.2 g/dL (ref 30.0–36.0)
MCV: 93.3 fL (ref 80.0–100.0)
Monocytes Absolute: 0.5 10*3/uL (ref 0.1–1.0)
Monocytes Relative: 7 %
Neutro Abs: 4.3 10*3/uL (ref 1.7–7.7)
Neutrophils Relative %: 52 %
Platelet Count: 166 10*3/uL (ref 150–400)
RBC: 3.44 MIL/uL — ABNORMAL LOW (ref 3.87–5.11)
RDW: 14.6 % (ref 11.5–15.5)
WBC Count: 8 10*3/uL (ref 4.0–10.5)
nRBC: 0 % (ref 0.0–0.2)

## 2018-09-23 ENCOUNTER — Telehealth: Payer: Self-pay | Admitting: Oncology

## 2018-09-23 NOTE — Telephone Encounter (Signed)
I talk with patient regarding schedule  

## 2018-09-27 ENCOUNTER — Other Ambulatory Visit: Payer: Self-pay

## 2018-09-27 ENCOUNTER — Ambulatory Visit
Admission: RE | Admit: 2018-09-27 | Discharge: 2018-09-27 | Disposition: A | Discharge status: Home / Self Care | Payer: Medicare Other | Source: Ambulatory Visit | Attending: Radiation Oncology | Admitting: Radiation Oncology

## 2018-09-27 DIAGNOSIS — Z510 Encounter for antineoplastic radiation therapy: Secondary | ICD-10-CM | POA: Diagnosis not present

## 2018-09-27 DIAGNOSIS — C50412 Malignant neoplasm of upper-outer quadrant of left female breast: Secondary | ICD-10-CM | POA: Diagnosis not present

## 2018-09-27 DIAGNOSIS — Z170 Estrogen receptor positive status [ER+]: Secondary | ICD-10-CM | POA: Insufficient documentation

## 2018-09-27 NOTE — Progress Notes (Signed)
Radiation Oncology         (336) [phone] ________________________________  Name: Emily Chambers MRN: [id number]  Date: 09/27/2018  DOB: 10/10/1950  SIMULATION AND TREATMENT PLANNING NOTE    ICD-10-CM   1. Malignant neoplasm of upper-outer quadrant of left breast in female, estrogen receptor positive ([place])  C50.412    Z17.0     DIAGNOSIS:  Multifocal left breast cancer  StageIA (ypT1b, ypN0) (cT1c, cN0, cM0),invasive ductal carcinoma of leftBreast, UOQ 2o'clock, ER+, PR-, HER2-, grade2  StageIA (ypT1c, ypN0) (cT1c, cN0, cM0)leftBreast LOQinvasive mammary carcinoma, ER+/ PR+/ Her2-, Grade2-3   NARRATIVE:  The patient was brought to the [facility].  Identity was confirmed.  All relevant records and images related to the planned course of therapy were reviewed.  The patient freely provided informed written consent to proceed with treatment after reviewing the details related to the planned course of therapy. The consent form was witnessed and verified by the simulation staff.  Then, the patient was set-up in a stable reproducible  supine position for radiation therapy.  CT images were obtained.  Surface markings were placed.  The CT images were loaded into the planning software.  Then the target and avoidance structures were contoured.  Treatment planning then occurred.  The radiation prescription was entered and confirmed.  Then, I designed and supervised the construction of a total of 5 medically necessary complex treatment devices.  I have requested : 3D Simulation  I have requested a DVH of the following structures: heart, lungs, lumpectomy cavity.  I have ordered:CBC  PLAN:  The patient will receive 40.05 Gy in 15 fractions followed by boost to lumpectomy cavity of 12 Gy in 6 fractions.    Optical Surface Tracking Plan:  Since intensity modulated radiotherapy (IMRT) and 3D conformal radiation treatment methods are predicated on accurate and precise  positioning for treatment, intrafraction motion monitoring is medically necessary to ensure accurate and safe treatment delivery.  The ability to quantify intrafraction motion without excessive ionizing radiation dose can only be performed with optical surface tracking. Accordingly, surface imaging offers the opportunity to obtain 3D measurements of patient position throughout IMRT and 3D treatments without excessive radiation exposure.  I am ordering optical surface tracking for this patient's upcoming course of radiotherapy. ________________________________  Special treatment procedure   was performed today due to the extra time and effort required by myself to plan and prepare this patient for deep inspiration breath hold technique.  I have determined cardiac sparing to be of benefit to this patient to prevent long term cardiac damage due to radiation of the heart.  Bellows were placed on the patient's abdomen. To facilitate cardiac sparing, the patient was coached by the radiation therapists on breath hold techniques and breathing practice was performed. Practice waveforms were obtained. The patient was then scanned while maintaining breath hold in the treatment position.  This image was then transferred over to the imaging specialist. The imaging specialist then created a fusion of the free breathing and breath hold scans using the chest wall as the stable structure. I personally reviewed the fusion in axial, coronal and sagittal image planes.  Excellent cardiac sparing was obtained.  I felt the patient is an appropriate candidate for breath hold and the patient will be treated as such.  The image fusion was then reviewed with the patient to reinforce the necessity of reproducible breath hold.    -----------------------------------  Blair Promise, PhD, MD  This document serves as a record  of services personally performed by Gery Pray, MD. It was created on his behalf by Wilburn Mylar, a  trained medical scribe. The creation of this record is based on the scribe's personal observations and the provider's statements to them. This document has been checked and approved by the attending provider.

## 2018-09-29 DIAGNOSIS — C50412 Malignant neoplasm of upper-outer quadrant of left female breast: Secondary | ICD-10-CM | POA: Diagnosis not present

## 2018-10-04 ENCOUNTER — Ambulatory Visit
Admission: RE | Admit: 2018-10-04 | Discharge: 2018-10-04 | Disposition: A | Discharge status: Home / Self Care | Payer: Medicare Other | Source: Ambulatory Visit | Attending: Radiation Oncology | Admitting: Radiation Oncology

## 2018-10-04 ENCOUNTER — Other Ambulatory Visit: Payer: Self-pay

## 2018-10-04 DIAGNOSIS — C50412 Malignant neoplasm of upper-outer quadrant of left female breast: Secondary | ICD-10-CM

## 2018-10-04 DIAGNOSIS — Z170 Estrogen receptor positive status [ER+]: Secondary | ICD-10-CM

## 2018-10-04 NOTE — Progress Notes (Signed)
  Radiation Oncology         [phone]) [phone] ________________________________  Name: AUBRINA NIEMAN MRN: [id number]  Date: 10/04/2018  DOB: [birth year]  Simulation Verification Note    ICD-10-CM   1. Malignant neoplasm of upper-outer quadrant of left breast in female, estrogen receptor positive ([place])  C50.412    Z17.0     Status: outpatient  NARRATIVE: The patient was brought to the treatment unit and placed in the planned treatment position. The clinical setup was verified. Then port films were obtained and uploaded to the radiation oncology medical record software.  The treatment beams were carefully compared against the planned radiation fields. The position location and shape of the radiation fields was reviewed. They targeted volume of tissue appears to be appropriately covered by the radiation beams. Organs at risk appear to be excluded as planned.  Based on my personal review, I approved the simulation verification. The patient's treatment will proceed as planned.  -----------------------------------  Blair Promise, PhD, MD  This document serves as a record of services personally performed by Gery Pray, MD. It was created on his behalf by Wilburn Mylar, a trained medical scribe. The creation of this record is based on the scribe's personal observations and the provider's statements to them. This document has been checked and approved by the attending provider.

## 2018-10-05 ENCOUNTER — Other Ambulatory Visit: Payer: Self-pay

## 2018-10-05 ENCOUNTER — Ambulatory Visit
Admission: RE | Admit: 2018-10-05 | Discharge: 2018-10-05 | Disposition: A | Discharge status: Home / Self Care | Payer: Medicare Other | Source: Ambulatory Visit | Attending: Radiation Oncology | Admitting: Radiation Oncology

## 2018-10-05 DIAGNOSIS — C50412 Malignant neoplasm of upper-outer quadrant of left female breast: Secondary | ICD-10-CM

## 2018-10-05 DIAGNOSIS — Z170 Estrogen receptor positive status [ER+]: Secondary | ICD-10-CM

## 2018-10-05 MED ORDER — ALRA NON-METALLIC DEODORANT (RAD-ONC)
1.0000 "application " | Freq: Once | TOPICAL | Status: AC
  Administered 2018-10-05: 1 via TOPICAL

## 2018-10-05 MED ORDER — RADIAPLEXRX EX GEL
Freq: Once | CUTANEOUS | Status: AC
  Administered 2018-10-05: 16:00:00 via TOPICAL

## 2018-10-06 ENCOUNTER — Ambulatory Visit
Admission: RE | Admit: 2018-10-06 | Discharge: 2018-10-06 | Disposition: A | Discharge status: Home / Self Care | Payer: Medicare Other | Source: Ambulatory Visit | Attending: Radiation Oncology | Admitting: Radiation Oncology

## 2018-10-06 ENCOUNTER — Other Ambulatory Visit: Payer: Self-pay

## 2018-10-06 DIAGNOSIS — C50412 Malignant neoplasm of upper-outer quadrant of left female breast: Secondary | ICD-10-CM | POA: Diagnosis not present

## 2018-10-07 ENCOUNTER — Ambulatory Visit
Admission: RE | Admit: 2018-10-07 | Discharge: 2018-10-07 | Disposition: A | Discharge status: Home / Self Care | Payer: Medicare Other | Source: Ambulatory Visit | Attending: Radiation Oncology | Admitting: Radiation Oncology

## 2018-10-07 ENCOUNTER — Other Ambulatory Visit: Payer: Self-pay

## 2018-10-07 DIAGNOSIS — C50412 Malignant neoplasm of upper-outer quadrant of left female breast: Secondary | ICD-10-CM | POA: Diagnosis not present

## 2018-10-08 ENCOUNTER — Ambulatory Visit
Admission: RE | Admit: 2018-10-08 | Discharge: 2018-10-08 | Disposition: A | Discharge status: Home / Self Care | Payer: Medicare Other | Source: Ambulatory Visit | Attending: Radiation Oncology | Admitting: Radiation Oncology

## 2018-10-08 ENCOUNTER — Telehealth: Payer: Self-pay | Admitting: *Deleted

## 2018-10-08 ENCOUNTER — Other Ambulatory Visit: Payer: Self-pay

## 2018-10-08 DIAGNOSIS — C50412 Malignant neoplasm of upper-outer quadrant of left female breast: Secondary | ICD-10-CM | POA: Diagnosis not present

## 2018-10-08 NOTE — Telephone Encounter (Signed)
Pt came by after her radiation today & states that she has some aching in her Left inner arm.  She has been doing radiation & having her arm raised.  She feels like she may have some swelling but not obvious to this nurse.  No warmth. She states she had 2 nodes removed from that arm.  Encouraged to try Tylenol/Advil & warm bath to see if that helps & if not & symptoms worse to let us know.  She expressed understanding.

## 2018-10-11 ENCOUNTER — Other Ambulatory Visit: Payer: Self-pay

## 2018-10-11 ENCOUNTER — Ambulatory Visit
Admission: RE | Admit: 2018-10-11 | Discharge: 2018-10-11 | Disposition: A | Discharge status: Home / Self Care | Payer: Medicare Other | Source: Ambulatory Visit | Attending: Radiation Oncology | Admitting: Radiation Oncology

## 2018-10-11 DIAGNOSIS — C50412 Malignant neoplasm of upper-outer quadrant of left female breast: Secondary | ICD-10-CM | POA: Diagnosis not present

## 2018-10-12 ENCOUNTER — Ambulatory Visit
Admission: RE | Admit: 2018-10-12 | Discharge: 2018-10-12 | Disposition: A | Discharge status: Home / Self Care | Payer: Medicare Other | Source: Ambulatory Visit | Attending: Radiation Oncology | Admitting: Radiation Oncology

## 2018-10-12 ENCOUNTER — Other Ambulatory Visit: Payer: Self-pay

## 2018-10-12 DIAGNOSIS — C50412 Malignant neoplasm of upper-outer quadrant of left female breast: Secondary | ICD-10-CM | POA: Diagnosis not present

## 2018-10-13 ENCOUNTER — Ambulatory Visit
Admission: RE | Admit: 2018-10-13 | Discharge: 2018-10-13 | Disposition: A | Discharge status: Home / Self Care | Payer: Medicare Other | Source: Ambulatory Visit | Attending: Radiation Oncology | Admitting: Radiation Oncology

## 2018-10-13 ENCOUNTER — Other Ambulatory Visit: Payer: Self-pay

## 2018-10-13 DIAGNOSIS — C50412 Malignant neoplasm of upper-outer quadrant of left female breast: Secondary | ICD-10-CM | POA: Diagnosis not present

## 2018-10-14 ENCOUNTER — Other Ambulatory Visit: Payer: Self-pay

## 2018-10-14 ENCOUNTER — Ambulatory Visit
Admission: RE | Admit: 2018-10-14 | Discharge: 2018-10-14 | Disposition: A | Discharge status: Home / Self Care | Payer: Medicare Other | Source: Ambulatory Visit | Attending: Radiation Oncology | Admitting: Radiation Oncology

## 2018-10-14 DIAGNOSIS — C50412 Malignant neoplasm of upper-outer quadrant of left female breast: Secondary | ICD-10-CM | POA: Diagnosis not present

## 2018-10-15 ENCOUNTER — Other Ambulatory Visit: Payer: Self-pay

## 2018-10-15 ENCOUNTER — Ambulatory Visit
Admission: RE | Admit: 2018-10-15 | Discharge: 2018-10-15 | Disposition: A | Discharge status: Home / Self Care | Payer: Medicare Other | Source: Ambulatory Visit | Attending: Radiation Oncology | Admitting: Radiation Oncology

## 2018-10-15 DIAGNOSIS — C50412 Malignant neoplasm of upper-outer quadrant of left female breast: Secondary | ICD-10-CM | POA: Diagnosis not present

## 2018-10-18 ENCOUNTER — Ambulatory Visit
Admission: RE | Admit: 2018-10-18 | Discharge: 2018-10-18 | Disposition: A | Discharge status: Home / Self Care | Payer: Medicare Other | Source: Ambulatory Visit | Attending: Radiation Oncology | Admitting: Radiation Oncology

## 2018-10-18 ENCOUNTER — Other Ambulatory Visit: Payer: Self-pay

## 2018-10-18 DIAGNOSIS — C50412 Malignant neoplasm of upper-outer quadrant of left female breast: Secondary | ICD-10-CM | POA: Diagnosis not present

## 2018-10-19 ENCOUNTER — Other Ambulatory Visit: Payer: Self-pay

## 2018-10-19 ENCOUNTER — Ambulatory Visit
Admission: RE | Admit: 2018-10-19 | Discharge: 2018-10-19 | Disposition: A | Discharge status: Home / Self Care | Payer: Medicare Other | Source: Ambulatory Visit | Attending: Radiation Oncology | Admitting: Radiation Oncology

## 2018-10-19 ENCOUNTER — Ambulatory Visit: Payer: Medicare Other | Admitting: Radiation Oncology

## 2018-10-19 DIAGNOSIS — C50412 Malignant neoplasm of upper-outer quadrant of left female breast: Secondary | ICD-10-CM | POA: Diagnosis not present

## 2018-10-20 ENCOUNTER — Other Ambulatory Visit: Payer: Self-pay

## 2018-10-20 ENCOUNTER — Ambulatory Visit
Admission: RE | Admit: 2018-10-20 | Discharge: 2018-10-20 | Disposition: A | Discharge status: Home / Self Care | Payer: Medicare Other | Source: Ambulatory Visit | Attending: Radiation Oncology | Admitting: Radiation Oncology

## 2018-10-20 DIAGNOSIS — C50412 Malignant neoplasm of upper-outer quadrant of left female breast: Secondary | ICD-10-CM | POA: Diagnosis not present

## 2018-10-21 ENCOUNTER — Other Ambulatory Visit: Payer: Self-pay

## 2018-10-21 ENCOUNTER — Ambulatory Visit
Admission: RE | Admit: 2018-10-21 | Discharge: 2018-10-21 | Disposition: A | Discharge status: Home / Self Care | Payer: Medicare Other | Source: Ambulatory Visit | Attending: Radiation Oncology | Admitting: Radiation Oncology

## 2018-10-21 DIAGNOSIS — C50412 Malignant neoplasm of upper-outer quadrant of left female breast: Secondary | ICD-10-CM | POA: Diagnosis not present

## 2018-10-22 ENCOUNTER — Other Ambulatory Visit: Payer: Self-pay

## 2018-10-22 ENCOUNTER — Ambulatory Visit
Admission: RE | Admit: 2018-10-22 | Discharge: 2018-10-22 | Disposition: A | Discharge status: Home / Self Care | Payer: Medicare Other | Source: Ambulatory Visit | Attending: Radiation Oncology | Admitting: Radiation Oncology

## 2018-10-22 DIAGNOSIS — C50412 Malignant neoplasm of upper-outer quadrant of left female breast: Secondary | ICD-10-CM | POA: Diagnosis not present

## 2018-10-25 ENCOUNTER — Other Ambulatory Visit: Payer: Self-pay

## 2018-10-25 ENCOUNTER — Ambulatory Visit
Admission: RE | Admit: 2018-10-25 | Discharge: 2018-10-25 | Disposition: A | Discharge status: Home / Self Care | Payer: Medicare Other | Source: Ambulatory Visit | Attending: Radiation Oncology | Admitting: Radiation Oncology

## 2018-10-25 DIAGNOSIS — C50412 Malignant neoplasm of upper-outer quadrant of left female breast: Secondary | ICD-10-CM | POA: Diagnosis not present

## 2018-10-26 ENCOUNTER — Other Ambulatory Visit: Payer: Self-pay

## 2018-10-26 ENCOUNTER — Ambulatory Visit
Admission: RE | Admit: 2018-10-26 | Discharge: 2018-10-26 | Disposition: A | Discharge status: Home / Self Care | Payer: Medicare Other | Source: Ambulatory Visit | Attending: Radiation Oncology | Admitting: Radiation Oncology

## 2018-10-26 DIAGNOSIS — C50412 Malignant neoplasm of upper-outer quadrant of left female breast: Secondary | ICD-10-CM | POA: Diagnosis not present

## 2018-10-26 DIAGNOSIS — Z170 Estrogen receptor positive status [ER+]: Secondary | ICD-10-CM | POA: Insufficient documentation

## 2018-10-26 DIAGNOSIS — Z510 Encounter for antineoplastic radiation therapy: Secondary | ICD-10-CM | POA: Diagnosis not present

## 2018-10-27 ENCOUNTER — Other Ambulatory Visit: Payer: Self-pay

## 2018-10-27 ENCOUNTER — Ambulatory Visit
Admission: RE | Admit: 2018-10-27 | Discharge: 2018-10-27 | Disposition: A | Discharge status: Home / Self Care | Payer: Medicare Other | Source: Ambulatory Visit | Attending: Radiation Oncology | Admitting: Radiation Oncology

## 2018-10-27 DIAGNOSIS — C50412 Malignant neoplasm of upper-outer quadrant of left female breast: Secondary | ICD-10-CM | POA: Diagnosis not present

## 2018-10-28 ENCOUNTER — Ambulatory Visit
Admission: RE | Admit: 2018-10-28 | Discharge: 2018-10-28 | Disposition: A | Discharge status: Home / Self Care | Payer: Medicare Other | Source: Ambulatory Visit | Attending: Radiation Oncology | Admitting: Radiation Oncology

## 2018-10-28 ENCOUNTER — Other Ambulatory Visit: Payer: Self-pay

## 2018-10-28 DIAGNOSIS — C50412 Malignant neoplasm of upper-outer quadrant of left female breast: Secondary | ICD-10-CM | POA: Diagnosis not present

## 2018-10-29 ENCOUNTER — Other Ambulatory Visit: Payer: Self-pay

## 2018-10-29 ENCOUNTER — Ambulatory Visit
Admission: RE | Admit: 2018-10-29 | Discharge: 2018-10-29 | Disposition: A | Discharge status: Home / Self Care | Payer: Medicare Other | Source: Ambulatory Visit | Attending: Radiation Oncology | Admitting: Radiation Oncology

## 2018-10-29 DIAGNOSIS — C50412 Malignant neoplasm of upper-outer quadrant of left female breast: Secondary | ICD-10-CM | POA: Diagnosis not present

## 2018-11-02 ENCOUNTER — Encounter: Payer: Self-pay | Admitting: Radiation Oncology

## 2018-11-02 ENCOUNTER — Encounter: Payer: Self-pay | Admitting: *Deleted

## 2018-11-02 ENCOUNTER — Ambulatory Visit
Admission: RE | Admit: 2018-11-02 | Discharge: 2018-11-02 | Disposition: A | Discharge status: Home / Self Care | Payer: Medicare Other | Source: Ambulatory Visit | Attending: Radiation Oncology | Admitting: Radiation Oncology

## 2018-11-02 ENCOUNTER — Other Ambulatory Visit: Payer: Self-pay

## 2018-11-02 ENCOUNTER — Telehealth: Payer: Self-pay | Admitting: Adult Health

## 2018-11-02 DIAGNOSIS — C50412 Malignant neoplasm of upper-outer quadrant of left female breast: Secondary | ICD-10-CM | POA: Diagnosis not present

## 2018-11-02 NOTE — Telephone Encounter (Signed)
Added appt per 9/8 sch message - pt to get an updated schedule next visit.

## 2018-12-01 NOTE — Progress Notes (Signed)
Radiation Oncology         [phone]) [phone] ________________________________  Name: Emily Chambers MRN: [id number]  Date: 12/02/2018  DOB: 1950-03-26  Follow-Up Visit Note  CC: Gaynelle Arabian, MD  Magrinat, Virgie Dad, MD    ICD-10-CM   1. Malignant neoplasm of upper-outer quadrant of left breast in female, estrogen receptor positive ([place])  C50.412    Z17.0     Diagnosis:   Multifocal Left Breast Cancer:  StageIA (ypT1b, ypN0) (cT1c, cN0, cM0),invasive ductal carcinoma of leftBreast, UOQ 2o'clock, ER+, PR-, HER2-, grade2  StageIA (ypT1c, ypN0) (cT1c, cN0, cM0)leftBreast LOQinvasive mammary carcinoma, ER+/ PR+/ Her2-, Grade2-3  Interval Since Last Radiation:  1 month   /11/2018 through 11/02/2018 Site Technique Total Dose Dose per Fx Completed Fx Beam Energies  Breast: Breast_Lt 3D 40.05/40.05 2.67 15/15 6X, 10X  Breast: Breast_Lt_Bst 3D 12/12 2 6/6 6X, 10X   Narrative:  The patient returns today for routine follow-up. She is scheduled to follow up with Dr. Jana Hakim on 12/06/2018.  On review of systems, she continues to have some mild fatigue but does not slow her down.  She has some mild soreness along the breast and nipple area  ALLERGIES:  has No Known Allergies.  Meds: Current Outpatient Medications  Medication Sig Dispense Refill  . amLODipine (NORVASC) 10 MG tablet Take 10 mg by mouth every morning.     Marland Kitchen aspirin 81 MG chewable tablet Chew 81 mg by mouth daily.    Marland Kitchen lidocaine-prilocaine (EMLA) cream Apply 1 application topically as needed. 30 g 0  . LORazepam (ATIVAN) 0.5 MG tablet Take 1 tablet (0.5 mg total) by mouth every 6 (six) hours as needed (Nausea or vomiting). 30 tablet 0  . losartan (COZAAR) 100 MG tablet Take 100 mg by mouth every morning.     Marland Kitchen oxyCODONE (OXY IR/ROXICODONE) 5 MG immediate release tablet Take 1 tablet (5 mg total) by mouth every 6 (six) hours as needed for severe pain. 15 tablet 0  . potassium chloride SA (K-DUR,KLOR-CON) 20 MEQ  tablet Take 1 tablet (20 mEq total) by mouth daily. 5 tablet 0  . pravastatin (PRAVACHOL) 20 MG tablet Take 20 mg by mouth every morning.     . prochlorperazine (COMPAZINE) 10 MG tablet Take 1 tablet (10 mg total) by mouth every 6 (six) hours as needed (Nausea or vomiting). 30 tablet 1   No current facility-administered medications for this encounter.     Physical Findings: The patient is in no acute distress. Patient is alert and oriented.  height is 5' 2"  (1.575 m) and weight is 180 lb 2 oz (81.7 kg). Her temporal temperature is 98.5 F (36.9 C). Her blood pressure is 126/85 and her pulse is 101 (abnormal). Her respiration is 18 and oxygen saturation is 98%. .  No significant changes. Lungs are clear to auscultation bilaterally. Heart has regular rate and rhythm. No palpable cervical, supraclavicular, or axillary adenopathy. Abdomen soft, non-tender, normal bowel sounds. Right Breast: no palpable mass, nipple discharge or bleeding. Left Breast: Mild hyperpigmentation changes noted.  The patient skin is healed well.  No dominant mass appreciated in the breast nipple discharge or bleeding  Lab Findings: Lab Results  Component Value Date   WBC 8.0 09/22/2018   HGB 10.0 (L) 09/22/2018   HCT 32.1 (L) 09/22/2018   MCV 93.3 09/22/2018   PLT 166 09/22/2018    Radiographic Findings: No results found.  Impression: The patient is doing well since completion of her radiation therapy.  No  evidence of recurrence on clinical exam.   Plan: PRN follow-up in radiation oncology.  She will continue close follow-up with medical oncology.  ____________________________________ Gery Pray, MD   This document serves as a record of services personally performed by Gery Pray, MD. It was created on his behalf by Wilburn Mylar, a trained medical scribe. The creation of this record is based on the scribe's personal observations and the provider's statements to them. This document has been checked and  approved by the attending provider.

## 2018-12-01 NOTE — Progress Notes (Signed)
  Patient Name: Emily Chambers MRN: [id number] DOB: 01-30-1951 Referring Physician: Lurline Del (Profile Not Attached) Date of Service: 11/02/2018  [facility] [place]                                                        End Of Treatment Note  Diagnoses: C50.412-Malignant neoplasm of upper-outer quadrant of left female breast  Cancer Staging: Multifocal left breast cancer:  StageIA (ypT1b, ypN0) (cT1c, cN0, cM0), Left Breast UOQinvasive ductal carcinoma  StageIA (ypT1c, ypN0) (cT1c, cN0, cM0)LeftBreast LOQinvasive mammary carcinoma  Intent: Curative  Radiation Treatment Dates: 10/04/2018 through 11/02/2018 Site Technique Total Dose Dose per Fx Completed Fx Beam Energies  Breast: Breast_Lt 3D 40.05/40.05 2.67 15/15 6X, 10X  Breast: Breast_Lt_Bst 3D 12/12 2 6/6 6X, 10X   Narrative: The patient tolerated radiation therapy relatively well. She reported moderate fatigue and breast tenderness. She denied pain and any other complaints throughout treatment. She was noted to have no moist desquamation or dry peeling.  Plan: The patient will follow-up with radiation oncology in 1 month.  ________________________________________________   Blair Promise, PhD, MD  This document serves as a record of services personally performed by Gery Pray, MD. It was created on his behalf by Wilburn Mylar, a trained medical scribe. The creation of this record is based on the scribe's personal observations and the provider's statements to them. This document has been checked and approved by the attending provider.

## 2018-12-02 ENCOUNTER — Other Ambulatory Visit: Payer: Self-pay

## 2018-12-02 ENCOUNTER — Ambulatory Visit
Admission: RE | Admit: 2018-12-02 | Discharge: 2018-12-02 | Disposition: A | Discharge status: Home / Self Care | Payer: Medicare Other | Source: Ambulatory Visit | Attending: Radiation Oncology | Admitting: Radiation Oncology

## 2018-12-02 ENCOUNTER — Encounter: Payer: Self-pay | Admitting: Radiation Oncology

## 2018-12-02 VITALS — BP 126/85 | HR 101 | Temp 98.5°F | Resp 18 | Ht 62.0 in | Wt 180.1 lb

## 2018-12-02 DIAGNOSIS — C50412 Malignant neoplasm of upper-outer quadrant of left female breast: Secondary | ICD-10-CM | POA: Insufficient documentation

## 2018-12-02 DIAGNOSIS — Z7982 Long term (current) use of aspirin: Secondary | ICD-10-CM | POA: Insufficient documentation

## 2018-12-02 DIAGNOSIS — Z79899 Other long term (current) drug therapy: Secondary | ICD-10-CM | POA: Diagnosis not present

## 2018-12-02 DIAGNOSIS — Z923 Personal history of irradiation: Secondary | ICD-10-CM | POA: Diagnosis not present

## 2018-12-02 DIAGNOSIS — Z170 Estrogen receptor positive status [ER+]: Secondary | ICD-10-CM | POA: Diagnosis not present

## 2018-12-02 NOTE — Progress Notes (Signed)
Pt presents today for f/u with Dr. Sondra Come. Pt denies c/o pain. Pt reports fatigue continues but rates fatigue as mild. Pt continues to use skin care cream provided by radiation. Pt reports breast is still hyperpigmented with light peeling. Pt reports breast is "sore" "under the skin near the nipple".   BP 126/85 (BP Location: Right Arm, Patient Position: Sitting)   Pulse (!) 101   Temp 98.5 F (36.9 C) (Temporal)   Resp 18   Ht 5\' 2"  (1.575 m)   Wt 180 lb 2 oz (81.7 kg)   SpO2 98%   BMI 32.95 kg/m   Wt Readings from Last 3 Encounters:  12/02/18 180 lb 2 oz (81.7 kg)  09/22/18 186 lb 4.8 oz (84.5 kg)  09/20/18 188 lb 6.4 oz (85.5 kg)   Loma Sousa, RN BSN

## 2018-12-02 NOTE — Patient Instructions (Signed)
Coronavirus (COVID-19) Are you at risk?  Are you at risk for the Coronavirus (COVID-19)?  To be considered HIGH RISK for Coronavirus (COVID-19), you have to meet the following criteria:  . Traveled to [place], [place], [place], [place] or [place]; or in the [place] to [place], [place], [place], or [place]; and have fever, cough, and shortness of breath within the last 2 weeks of travel OR . Been in close contact with a person diagnosed with COVID-19 within the last 2 weeks and have fever, cough, and shortness of breath . IF YOU DO NOT MEET THESE CRITERIA, YOU ARE CONSIDERED LOW RISK FOR COVID-19.  What to do if you are HIGH RISK for COVID-19?  . If you are having a medical emergency, call 911. . Seek medical care right away. Before you go to a doctor's office, urgent care or emergency department, call ahead and tell them about your recent travel, contact with someone diagnosed with COVID-19, and your symptoms. You should receive instructions from your physician's office regarding next steps of care.  . When you arrive at healthcare provider, tell the healthcare staff immediately you have returned from visiting [place], [place], [place], [place] or [place]; or traveled in the [place] to [place], [place], [place], or [place]; in the last two weeks or you have been in close contact with a person diagnosed with COVID-19 in the last 2 weeks.   . Tell the health care staff about your symptoms: fever, cough and shortness of breath. . After you have been seen by a medical provider, you will be either: o Tested for (COVID-19) and discharged home on quarantine except to seek medical care if symptoms worsen, and asked to  - Stay home and avoid contact with others until you get your results (4-5 days)  - Avoid travel on public transportation if possible (such as bus, train, or airplane) or o Sent to the Emergency Department by EMS for evaluation, COVID-19 testing, and possible  admission depending on your condition and test results.  What to do if you are LOW RISK for COVID-19?  Reduce your risk of any infection by using the same precautions used for avoiding the common cold or flu:  . Wash your hands often with soap and warm water for at least 20 seconds.  If soap and water are not readily available, use an alcohol-based hand sanitizer with at least 60% alcohol.  . If coughing or sneezing, cover your mouth and nose by coughing or sneezing into the elbow areas of your shirt or coat, into a tissue or into your sleeve (not your hands). . Avoid shaking hands with others and consider head nods or verbal greetings only. . Avoid touching your eyes, nose, or mouth with unwashed hands.  . Avoid close contact with people who are sick. . Avoid places or events with large numbers of people in one location, like concerts or sporting events. . Carefully consider travel plans you have or are making. . If you are planning any travel outside or inside the [place], visit the CDC's Travelers' Health webpage for the latest health notices. . If you have some symptoms but not all symptoms, continue to monitor at home and seek medical attention if your symptoms worsen. . If you are having a medical emergency, call 911.   ADDITIONAL HEALTHCARE OPTIONS FOR PATIENTS  [facility] / e-Visit: [email or web address]         [facility]: [phone]  [facility]: [phone]                   [facility]: [phone]   

## 2018-12-05 NOTE — Progress Notes (Signed)
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 She was last seen here on 09/22/2018.   She completed radiation therapy 10/04/2018 through 11/02/2018 Site Technique Total Dose Dose per Fx Completed Fx Beam Energies  Breast: Breast_Lt 3D 40.05/40.05 2.67 15/15 6X, 10X  Breast: Breast_Lt_Bst 3D 12/12 2 6/6 6X, 10X   Since her last visit here, she has not undergone any additional studies.    REVIEW OF SYSTEMS: Emily Chambers did remarkably well with her radiation, with mild fatigue which is pretty much resolved, and mild hyperpigmentation which is fading.  She is eager to get back to work but she tells me someone downstairs told her she would not be able to work because her work involves lifting (food trays) and she had no maxillary lymph nodes removed.  Incidentally she has not been to physical therapy.  A detailed review of systems today was otherwise stable  HISTORY OF CURRENT ILLNESS: From the original intake note:  Emily Chambers had routine screening mammography on 02/10/2018 showing a possible abnormality in the left breast. She underwent bilateral diagnostic mammography with tomography and left breast ultrasonography at [facility] on 02/16/2018 showing: breast density category C; two left breast masses, one at 2 o'clock and the other at 3:30 o'clock.  The 2 o'clock mass (1.3 x 1 x 1 cm) corresponds to the mammographic abnormality and is highly suspicious for breast carcinoma. The other mass (1.2 x 0.7 x 1.2 cm)  is suspicious for breast carcinoma. No left axillary adenopathy.   Accordingly on 02/19/2018 she proceeded to biopsy of the left breast area in question. The pathology from this procedure ([id number]) showed:  1) 3:30 o'clock specimen showed invasive mammary carcinoma, possibly lobular (weak and atypical e-cadherin expression), grade 2. Prognostic indicators significant for: estrogen receptor, 90% positive and progesterone receptor, 95% positive, both with strong staining intensity. Proliferation marker Ki67 at 5%. HER2 negative (1+).   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 Prognostic indicators significant for: estrogen receptor, 10% positive with moderate staining intensity and progesterone receptor, 0% negative. Proliferation marker Ki67 at 40%. HER2 negative (1+).   The patient's subsequent history is as detailed below.   PAST MEDICAL HISTORY: Past Medical History:  Diagnosis Date   Family history of breast cancer    Family history of lung cancer    Family history of throat cancer    High cholesterol    Hypertension    Kidney stone    Kidney stones    UTI (lower urinary tract infection)     PAST SURGICAL HISTORY: Past Surgical History:  Procedure Laterality Date   BREAST EXCISIONAL BIOPSY Left 1999,2005   cysts removed   BREAST LUMPECTOMY WITH RADIOACTIVE SEED AND SENTINEL LYMPH NODE BIOPSY Left 09/01/2018   Procedure: LEFT BREAST  RADIOACTIVE SEED LUMPECTOMY X2 AND LEFT SENTINEL LYMPH NODE BIOPSY AND MAPPING;  Surgeon: Stark Klein, MD;  Location: [facility];  Service: General;  Laterality: Left;  BREAST SURGERY Left    cyst and biopsy   PORTACATH PLACEMENT Left 03/10/2018   Procedure: INSERTION PORT-A-CATH;  Surgeon: Stark Klein, MD;  Location: [facility];  Service: General;  Laterality: Left;   RADIOACTIVE SEED GUIDED EXCISIONAL BREAST BIOPSY Right 09/01/2018   Procedure: RADIOACTIVE SEED GUIDED EXCISIONAL RIGHT BREAST BIOPSY;  Surgeon: Stark Klein, MD;  Location: [facility];  Service: General;  Laterality: Right;    FAMILY HISTORY: Family History  Problem Relation Age of Onset   Hypertension Mother    Heart disease Mother    Dementia Mother    Healthy Father    Colon cancer Maternal Grandmother    Cancer Paternal Grandmother        unk type   Lung cancer Cousin        89s   Breast cancer Cousin        60s   Breast cancer Cousin        57s   Breast cancer Cousin    Cancer Paternal Aunt        unk type d. 50, possibly pancreatic   Cancer Paternal Aunt        unk type d. 74s   Cancer Paternal Uncle        unk type    Cancer Paternal Uncle        unk type d. 82s   Cancer Maternal Aunt        unk type, d. 73s   Throat cancer Maternal Uncle        d. 65s   Ovarian cancer Neg Hx    Patient father is alive at 1 years old. Patient mother died from heart disease and dementia at age 37.  The patient denies a family hx of ovarian cancer. She has 3 siblings, 1 brother and 2 sisters. She has a maternal cousin diagnosed with lung cancer in her 69s. She has 3 paternal cousins with breast cancer, one was diagnosed in her 55s and has passed away.   GYNECOLOGIC HISTORY:  No LMP recorded. Patient is postmenopausal. Menarche: 68 years old Age at first live birth: n/a GX P 0 LMP 2010 Contraceptive n/a HRT no  Hysterectomy? no BSo? no   SOCIAL HISTORY: She is single and works as a Scientist, water quality at [employer]). She lives alone, with no pets.    ADVANCED DIRECTIVES: Not in place.  At the 03/03/2018 visit she was given the appropriate documents to complete and notarize at her discretion.  She is planning to name her niece, [name], as her HCPOA.   HEALTH  MAINTENANCE: Social History   Tobacco Use   Smoking status: Never Smoker   Smokeless tobacco: Never Used  Substance Use Topics   Alcohol use: Yes    Comment: seldom   Drug use: No     Colonoscopy: 2014? [facility]  PAP: 02/02/2018  Bone density: 03/15/2016, T-score 0.4, Dr. Alden Hipp   No Known Allergies  Current Outpatient Medications  Medication Sig Dispense Refill   amLODipine (NORVASC) 10 MG tablet Take 10 mg by mouth every morning.      anastrozole (ARIMIDEX) 1 MG tablet Take 1 tablet (1 mg total) by mouth daily. 90 tablet 4   aspirin 81 MG chewable tablet Chew 81 mg by mouth daily.     cephALEXin (KEFLEX) 500 MG capsule Take 1 capsule (500 mg total) by mouth 2 (two) times daily. 10 capsule 0   lidocaine-prilocaine (EMLA) cream  Apply 1 application topically as needed. 30 g 0   LORazepam (ATIVAN) 0.5 MG tablet Take 1 tablet (0.5 mg total) by mouth every 6 (six) hours as needed (Nausea or vomiting). 30 tablet 0   losartan (COZAAR) 100 MG tablet Take 100 mg by mouth every morning.      oxyCODONE (OXY IR/ROXICODONE) 5 MG immediate release tablet Take 1 tablet (5 mg total) by mouth every 6 (six) hours as needed for severe pain. 15 tablet 0   potassium chloride SA (K-DUR,KLOR-CON) 20 MEQ tablet Take 1 tablet (20 mEq total) by mouth daily. 5 tablet 0   pravastatin (PRAVACHOL) 20 MG tablet Take 20 mg by mouth every morning.      prochlorperazine (COMPAZINE) 10 MG tablet Take 1 tablet (10 mg total) by mouth every 6 (six) hours as needed (Nausea or vomiting). 30 tablet 1   No current facility-administered medications for this visit.     OBJECTIVE: Middle-aged African-American woman in no acute distress  Vitals:   12/06/18 1036  BP: 138/77  Pulse: 66  Resp: 18  Temp: 98.2 F (36.8 C)  SpO2: 100%   Wt Readings from Last 3 Encounters:  12/06/18 180 lb 14.4 oz (82.1 kg)  12/02/18 180 lb 2 oz (81.7 kg)  09/22/18 186 lb 4.8 oz (84.5 kg)   Body mass index is 33.09  kg/m.    ECOG FS:1 - Symptomatic but completely ambulatory  Ocular: Sclerae unicteric, pupils round and equal Ear-nose-throat: Wearing a mask Lymphatic: No cervical or supraclavicular adenopathy Lungs no rales or rhonchi Heart regular rate and rhythm Abd soft, nontender, positive bowel sounds MSK no focal spinal tenderness, no joint edema Neuro: non-focal, well-oriented, appropriate affect Breasts: The right breast is benign.  The left breast is status post lumpectomy and radiation.  The hyperpigmentation is almost completely resolved.  The cosmetic result is excellent.  There is no evidence of local recurrence.  Both axillae are benign. Skin: She has a "boil" in the upper left back, measuring approximately 1-1/2 cm and looking ready to "point".  LAB RESULTS:  CMP     Component Value Date/Time   NA 142 12/06/2018 1019   K 3.5 12/06/2018 1019   CL 108 12/06/2018 1019   CO2 25 12/06/2018 1019   GLUCOSE 114 (H) 12/06/2018 1019   BUN 11 12/06/2018 1019   CREATININE 0.75 12/06/2018 1019   CALCIUM 9.7 12/06/2018 1019   PROT 7.7 12/06/2018 1019   ALBUMIN 3.8 12/06/2018 1019   AST 18 12/06/2018 1019   ALT 21 12/06/2018 1019   ALKPHOS 95 12/06/2018 1019   BILITOT 0.3 12/06/2018 1019   GFRNONAA >60 12/06/2018 1019   GFRAA >60 12/06/2018 1019    No results found for: TOTALPROTELP, ALBUMINELP, A1GS, A2GS, BETS, BETA2SER, GAMS, MSPIKE, SPEI  No results found for: KPAFRELGTCHN, LAMBDASER, KAPLAMBRATIO  Lab Results  Component Value Date   WBC 7.0 12/06/2018   NEUTROABS 4.6 12/06/2018   HGB 11.2 (L) 12/06/2018   HCT 35.8 (L) 12/06/2018   MCV 89.1 12/06/2018   PLT 190 12/06/2018    _0 @  No results found for: LABCA2  No components found for: HYWVPX106  No results for input(s): INR in the last 168 hours.  No results found for: LABCA2  No results found for: YIR485  No results found for: IOE703  No results found for: JKK938  No results found for:  CA2729  No components found for: HGQUANT  No results found for: CEA1 / No results found for: CEA1  No results found for: AFPTUMOR  No results found for: [place]  No results found for: PSA1  Appointment on 12/06/2018  Component Date Value Ref Range Status   WBC Count 12/06/2018 7.0  4.0 - 10.5 K/uL Final   RBC 12/06/2018 4.02  3.87 - 5.11 MIL/uL Final   Hemoglobin 12/06/2018 11.2* 12.0 - 15.0 g/dL Final   HCT 12/06/2018 35.8* 36.0 - 46.0 % Final   MCV 12/06/2018 89.1  80.0 - 100.0 fL Final   MCH 12/06/2018 27.9  26.0 - 34.0 pg Final   MCHC 12/06/2018 31.3  30.0 - 36.0 g/dL Final   RDW 12/06/2018 12.7  11.5 - 15.5 % Final   Platelet Count 12/06/2018 190  150 - 400 K/uL Final   nRBC 12/06/2018 0.0  0.0 - 0.2 % Final   Neutrophils Relative % 12/06/2018 65  % Final   Neutro Abs 12/06/2018 4.6  1.7 - 7.7 K/uL Final   Lymphocytes Relative 12/06/2018 23  % Final   Lymphs Abs 12/06/2018 1.6  0.7 - 4.0 K/uL Final   Monocytes Relative 12/06/2018 7  % Final   Monocytes Absolute 12/06/2018 0.5  0.1 - 1.0 K/uL Final   Eosinophils Relative 12/06/2018 4  % Final   Eosinophils Absolute 12/06/2018 0.3  0.0 - 0.5 K/uL Final   Basophils Relative 12/06/2018 1  % Final   Basophils Absolute 12/06/2018 0.1  0.0 - 0.1 K/uL Final   Immature Granulocytes 12/06/2018 0  % Final   Abs Immature Granulocytes 12/06/2018 0.01  0.00 - 0.07 K/uL Final   Performed at [facility], [place] [address] [place], [place] [address]   Sodium 12/06/2018 142  135 - 145 mmol/L Final   Potassium 12/06/2018 3.5  3.5 - 5.1 mmol/L Final   Chloride 12/06/2018 108  98 - 111 mmol/L Final   CO2 12/06/2018 25  22 - 32 mmol/L Final   Glucose, Bld 12/06/2018 114* 70 - 99 mg/dL Final   BUN 12/06/2018 11  8 - 23 mg/dL Final   Creatinine 12/06/2018 0.75  0.44 - 1.00 mg/dL Final   Calcium 12/06/2018 9.7  8.9 - 10.3 mg/dL Final   Total Protein 12/06/2018 7.7  6.5 - 8.1 g/dL  Final   Albumin 12/06/2018 3.8  3.5 - 5.0 g/dL Final   AST 12/06/2018 18  15 - 41 U/L Final   ALT 12/06/2018 21  0 - 44 U/L Final   Alkaline Phosphatase 12/06/2018 95  38 - 126 U/L Final   Total Bilirubin 12/06/2018 0.3  0.3 - 1.2 mg/dL Final   GFR, Est Non Af Am 12/06/2018 >60  >60 mL/min Final   GFR, Est AFR Am 12/06/2018 >60  >60 mL/min Final   Anion gap 12/06/2018 9  5 - 15 Final   Performed at [facility], [address] [place], [place] [address]    (this displays the last labs from the last 3 days)  No results found for: TOTALPROTELP, ALBUMINELP, A1GS, A2GS, BETS, BETA2SER, GAMS, MSPIKE, SPEI (this displays SPEP labs)  No results found for: KPAFRELGTCHN, LAMBDASER, KAPLAMBRATIO (kappa/lambda light chains)  No results found for: HGBA, HGBA2QUANT, HGBFQUANT, HGBSQUAN (Hemoglobinopathy evaluation)   No results found for: LDH  No results found for: IRON, TIBC, IRONPCTSAT (Iron and TIBC)  No results found for: FERRITIN  Urinalysis    Component Value Date/Time   COLORURINE YELLOW 04/12/2018 [place] 04/12/2018 1504   LABSPEC 1.020 04/12/2018 1504   PHURINE 5.0 04/12/2018  [place] 04/12/2018 1504   HGBUR NEGATIVE 04/12/2018 1504   BILIRUBINUR NEGATIVE 04/12/2018 1504   KETONESUR NEGATIVE 04/12/2018 1504   PROTEINUR NEGATIVE 04/12/2018 1504   UROBILINOGEN 0.2 07/07/2012 1159   NITRITE NEGATIVE 04/12/2018 1504   LEUKOCYTESUR TRACE (A) 04/12/2018 1504     STUDIES: No results found.   ELIGIBLE FOR AVAILABLE RESEARCH PROTOCOL: Upbeat   ASSESSMENT: 68 y.o. [place] woman status post left breast biopsy x2 on 02/19/2018, showing  (a) in the upper outer quadrant, a clinical T1c N0, stage IA invasive carcinoma, likely lobular, grade 2, estrogen and progesterone receptor positive, HER-2 not amplified, with an MIB-1 of 5%  (b) in the lower outer quadrant a clinical T1c N0, stage IA-B invasive ductal  carcinoma, grade 2, estrogen receptor only moderately positive at 10%, progesterone receptor negative, with an MIB-1 of 40%, and HER-2 not amplified  (1) neoadjuvant chemotherapy will consist of doxorubicin and cyclophosphamide in dose dense fashion x4 starting 03/16/2018, 08/14/2018 04/27/2018, followed by paclitaxel and carboplatin weekly x12 starting 05/11/2018, completed 07/27/2018  (a) echo on 03/11/2018 shows well preserved EF of 60-65%  (2) status post left lumpectomy and sentinel lymph node sampling 09/01/2018 showing  (a) invasive ductal carcinoma, grade 2, ypT1b ypN0, triple negative  (b) invasive lobular carcinoma, grade 2, ypT1c ypN0, estrogen and progesterone receptor positive, HER-2 not amplified  (3) adjuvant radiation 10/04/2018 through 11/02/2018 Site Technique Total Dose Dose per Fx Completed Fx Beam Energies  Breast: Breast_Lt 3D 40.05/40.05 2.67 15/15 6X, 10X  Breast: Breast_Lt_Bst 3D 12/12 2 6/6 6X, 10X    (4) anastrozole (for upper outer quadrant tumor) started 12/06/2018  (a) bone density 03/25/2016 at the breast center showed a T score of 0.4 this is normal).  (5) genetics testing on 03/24/2018 showed no pathogenic mutations.  Genes tested include:  APC, ATM, AXIN2, BARD1, BMPR1A, BRCA1, BRCA2, BRIP1, CDH1, CDKN2A (p14ARF), CDKN2A (p16INK4a), CKD4, CHEK2, CTNNA1, DICER1, EPCAM (Deletion/duplication testing only), GREM1 (promoter region deletion/duplication testing only), KIT, MEN1, MLH1, MSH2, MSH3, MSH6, MUTYH, NBN, NF1, NHTL1, PALB2, PDGFRA, PMS2, POLD1, POLE, PTEN, RAD50, RAD51C, RAD51D, SDHB, SDHC, SDHD, SMAD4, SMARCA4. STK11, TP53, TSC1, TSC2, and VHL.  The following genes were evaluated for sequence changes only: SDHA and HOXB13 c.251G>A variant only.   PLAN: Emily Chambers did remarkably well with her radiation treatments and she is now ready to start antiestrogens.  We are going to go with anastrozole and today we discussed the possible toxicities side effects and  complications of this agent.  I have placed a prescription for her optimum Rx and she will start it as soon as she receives it.  She will see [place] in about 3 months to discuss tolerance.  I put in a referral to physical therapy so that she can learn how to do massage and recognized lymphedema if it develops.  I also wrote her a prescription for left compression sleeve and they can measure her for that and give her instructions on how to obtain that  I do not know who told her that she could not work because she had some lymph nodes removed.  She is very eager to get back to work and I wrote her a letter today saying that she is ready to do so.   I am putting her on low-dose Keflex for her "boil" on her upper left back, and I also suggested she could use hot compresses.  If that does not resolve within the next few days she will let us  know.  She will return to see [place] in approximately 3 months but she will call us if any problems develop before that visit. Tarig Zimmers, Virgie Dad, MD  12/06/18 11:07 AM Medical Oncology and Hematology [facility] [place], [place] [address] Tel. [phone]    Fax. [phone]  I, Jacqualyn Posey am acting as a Education administrator for Chauncey Cruel, MD.   I, Lurline Del MD, have reviewed the above documentation for accuracy and completeness, and I agree with the above.

## 2018-12-06 ENCOUNTER — Other Ambulatory Visit: Payer: Self-pay

## 2018-12-06 ENCOUNTER — Inpatient Hospital Stay: Payer: Medicare Other

## 2018-12-06 ENCOUNTER — Inpatient Hospital Stay: Payer: Medicare Other | Attending: Oncology | Admitting: Oncology

## 2018-12-06 ENCOUNTER — Encounter: Payer: Self-pay | Admitting: Oncology

## 2018-12-06 VITALS — BP 138/77 | HR 66 | Temp 98.2°F | Resp 18 | Ht 62.0 in | Wt 180.9 lb

## 2018-12-06 DIAGNOSIS — Z79811 Long term (current) use of aromatase inhibitors: Secondary | ICD-10-CM | POA: Insufficient documentation

## 2018-12-06 DIAGNOSIS — Z171 Estrogen receptor negative status [ER-]: Secondary | ICD-10-CM | POA: Diagnosis not present

## 2018-12-06 DIAGNOSIS — C50512 Malignant neoplasm of lower-outer quadrant of left female breast: Secondary | ICD-10-CM | POA: Insufficient documentation

## 2018-12-06 DIAGNOSIS — C50412 Malignant neoplasm of upper-outer quadrant of left female breast: Secondary | ICD-10-CM | POA: Insufficient documentation

## 2018-12-06 DIAGNOSIS — Z923 Personal history of irradiation: Secondary | ICD-10-CM | POA: Insufficient documentation

## 2018-12-06 DIAGNOSIS — Z9221 Personal history of antineoplastic chemotherapy: Secondary | ICD-10-CM | POA: Diagnosis not present

## 2018-12-06 DIAGNOSIS — Z170 Estrogen receptor positive status [ER+]: Secondary | ICD-10-CM | POA: Insufficient documentation

## 2018-12-06 LAB — CBC WITH DIFFERENTIAL (CANCER CENTER ONLY)
Abs Immature Granulocytes: 0.01 10*3/uL (ref 0.00–0.07)
Basophils Absolute: 0.1 10*3/uL (ref 0.0–0.1)
Basophils Relative: 1 %
Eosinophils Absolute: 0.3 10*3/uL (ref 0.0–0.5)
Eosinophils Relative: 4 %
HCT: 35.8 % — ABNORMAL LOW (ref 36.0–46.0)
Hemoglobin: 11.2 g/dL — ABNORMAL LOW (ref 12.0–15.0)
Immature Granulocytes: 0 %
Lymphocytes Relative: 23 %
Lymphs Abs: 1.6 10*3/uL (ref 0.7–4.0)
MCH: 27.9 pg (ref 26.0–34.0)
MCHC: 31.3 g/dL (ref 30.0–36.0)
MCV: 89.1 fL (ref 80.0–100.0)
Monocytes Absolute: 0.5 10*3/uL (ref 0.1–1.0)
Monocytes Relative: 7 %
Neutro Abs: 4.6 10*3/uL (ref 1.7–7.7)
Neutrophils Relative %: 65 %
Platelet Count: 190 10*3/uL (ref 150–400)
RBC: 4.02 MIL/uL (ref 3.87–5.11)
RDW: 12.7 % (ref 11.5–15.5)
WBC Count: 7 10*3/uL (ref 4.0–10.5)
nRBC: 0 % (ref 0.0–0.2)

## 2018-12-06 LAB — CMP (CANCER CENTER ONLY)
ALT: 21 U/L (ref 0–44)
AST: 18 U/L (ref 15–41)
Albumin: 3.8 g/dL (ref 3.5–5.0)
Alkaline Phosphatase: 95 U/L (ref 38–126)
Anion gap: 9 (ref 5–15)
BUN: 11 mg/dL (ref 8–23)
CO2: 25 mmol/L (ref 22–32)
Calcium: 9.7 mg/dL (ref 8.9–10.3)
Chloride: 108 mmol/L (ref 98–111)
Creatinine: 0.75 mg/dL (ref 0.44–1.00)
GFR, Est AFR Am: >60 mL/min (ref >60)
GFR, Estimated: >60 mL/min (ref >60)
Glucose, Bld: 114 mg/dL — ABNORMAL HIGH (ref 70–99)
Potassium: 3.5 mmol/L (ref 3.5–5.1)
Sodium: 142 mmol/L (ref 135–145)
Total Bilirubin: 0.3 mg/dL (ref 0.3–1.2)
Total Protein: 7.7 g/dL (ref 6.5–8.1)

## 2018-12-06 MED ORDER — ANASTROZOLE 1 MG PO TABS: 1.0000 mg | ORAL_TABLET | Freq: Every day | ORAL | 4 refills | Status: DC

## 2018-12-06 MED ORDER — CEPHALEXIN 500 MG PO CAPS: 500.0000 mg | ORAL_CAPSULE | Freq: Two times a day (BID) | ORAL | 0 refills | Status: DC

## 2018-12-07 ENCOUNTER — Other Ambulatory Visit: Payer: Self-pay | Admitting: General Surgery

## 2018-12-08 ENCOUNTER — Other Ambulatory Visit: Payer: Self-pay | Admitting: *Deleted

## 2018-12-08 ENCOUNTER — Other Ambulatory Visit: Payer: Self-pay | Admitting: Oncology

## 2018-12-08 MED ORDER — DOXYCYCLINE HYCLATE 100 MG PO TABS: 100.0000 mg | ORAL_TABLET | Freq: Two times a day (BID) | ORAL | 0 refills | Status: DC

## 2018-12-09 ENCOUNTER — Ambulatory Visit: Payer: Medicare Other | Attending: Oncology | Admitting: Rehabilitation

## 2018-12-09 ENCOUNTER — Other Ambulatory Visit: Payer: Self-pay

## 2018-12-09 ENCOUNTER — Encounter: Payer: Self-pay | Admitting: Rehabilitation

## 2018-12-09 DIAGNOSIS — Z170 Estrogen receptor positive status [ER+]: Secondary | ICD-10-CM | POA: Diagnosis present

## 2018-12-09 DIAGNOSIS — M25612 Stiffness of left shoulder, not elsewhere classified: Secondary | ICD-10-CM | POA: Insufficient documentation

## 2018-12-09 DIAGNOSIS — R293 Abnormal posture: Secondary | ICD-10-CM | POA: Insufficient documentation

## 2018-12-09 DIAGNOSIS — M6281 Muscle weakness (generalized): Secondary | ICD-10-CM | POA: Diagnosis present

## 2018-12-09 DIAGNOSIS — C50512 Malignant neoplasm of lower-outer quadrant of left female breast: Secondary | ICD-10-CM | POA: Insufficient documentation

## 2018-12-09 NOTE — Therapy (Signed)
[place], [place], [address] Phone: [phone]   Fax:  [phone]  Physical Therapy Evaluation  Patient Details  Name: Emily Chambers MRN: [id number] Date of Birth: Feb 03, 1951 Referring Provider (PT): Dr. Jana Hakim   Encounter Date: 12/09/2018  PT End of Session - 12/09/18 1701    Visit Number  1    Number of Visits  7    Date for PT Re-Evaluation  01/20/19    Authorization Type  Medicare [employer]    PT Start Time  1600    PT Stop Time  1650    PT Time Calculation (min)  50 min    Activity Tolerance  Patient tolerated treatment well    Behavior During Therapy  [facility] for tasks assessed/performed       Past Medical History:  Diagnosis Date  . Family history of breast cancer   . Family history of lung cancer   . Family history of throat cancer   . High cholesterol   . Hypertension   . Kidney stone   . Kidney stones   . UTI (lower urinary tract infection)     Past Surgical History:  Procedure Laterality Date  . BREAST EXCISIONAL BIOPSY Left 1999,2005   cysts removed  . BREAST LUMPECTOMY WITH RADIOACTIVE SEED AND SENTINEL LYMPH NODE BIOPSY Left 09/01/2018   Procedure: LEFT BREAST  RADIOACTIVE SEED LUMPECTOMY X2 AND LEFT SENTINEL LYMPH NODE BIOPSY AND MAPPING;  Surgeon: Stark Klein, MD;  Location: [place];  Service: General;  Laterality: Left;  . BREAST SURGERY Left    cyst and biopsy  . PORTACATH PLACEMENT Left 03/10/2018   Procedure: INSERTION PORT-A-CATH;  Surgeon: Stark Klein, MD;  Location: [place];  Service: General;  Laterality: Left;  . RADIOACTIVE SEED GUIDED EXCISIONAL BREAST BIOPSY Right 09/01/2018   Procedure: RADIOACTIVE SEED GUIDED EXCISIONAL RIGHT BREAST BIOPSY;  Surgeon: Stark Klein, MD;  Location: [place];  Service: General;  Laterality: Right;    There were no vitals filed for this visit.   Subjective Assessment -  12/09/18 1552    Subjective  I work for [employer] and need to start back in [employer] lifting.  I have had some swelling in the breast but it is okay now.  just wearing underwire bra.  Some tingling in the toes and fingertips    Pertinent History  Pt presents post chemotherapy doxorubicin and cyclosphosphamide, paclitaxel and carboplatin completed 07/27/18.  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  Radiation completed to the left breast on 11/02/18.  On anastrozole.  Other include HTN, cholesterol.    Limitations  Lifting    Patient Stated Goals  get ready for work    Currently in Pain?  No/denies         [facility] PT Assessment - 12/09/18 0001      Assessment   Medical Diagnosis  left breast cancer and SLNB    Referring Provider (PT)  Dr. Jana Hakim    Onset Date/Surgical Date  09/01/18    Hand Dominance  Right    Next MD Visit  as needed    Prior Therapy  no      Precautions   Precaution Comments  lymphedema       Restrictions   Weight Bearing Restrictions  No      Balance Screen   Has the patient fallen in  the past 6 months  No    Has the patient had a decrease in activity level because of a fear of falling?   No    Is the patient reluctant to leave their home because of a fear of falling?   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    Leisure  walking      Cognition   Overall Cognitive Status  Within Functional Limits for tasks assessed      Observation/Other Assessments   Observations  wearing underwire bra    Skin Integrity  darkened nipple and skin on the left, well healed incisions, no evidence of lymphedema      Sensation    Additional Comments  left axilla reported numb      Coordination   Gross Motor Movements are Fluid and Coordinated  Yes      Posture/Postural Control   Posture/Postural Control  Postural limitations    Postural Limitations  Rounded Shoulders;Forward head;Increased thoracic kyphosis      ROM / Strength   AROM / PROM / Strength  AROM;PROM;Strength      AROM   AROM Assessment Site  Shoulder    Right/Left Shoulder  Right;Left    Right Shoulder Extension  60 Degrees    Right Shoulder Flexion  140 Degrees    Right Shoulder ABduction  167 Degrees    Right Shoulder Internal Rotation  70 Degrees    Right Shoulder External Rotation  75 Degrees    Right Shoulder Horizontal ABduction  33 Degrees    Left Shoulder Extension  50 Degrees    Left Shoulder Flexion  150 Degrees   pull   Left Shoulder ABduction  153 Degrees    Left Shoulder Internal Rotation  75 Degrees    Left Shoulder External Rotation  72 Degrees    Left Shoulder Horizontal ABduction  10 Degrees      Strength   Strength Assessment Site  Shoulder    Right/Left Shoulder  Right;Left    Right Shoulder Flexion  4/5    Right Shoulder ABduction  4+/5    Right Shoulder Internal Rotation  4+/5    Right Shoulder External Rotation  4+/5    Left Shoulder Flexion  4/5    Left Shoulder ABduction  4+/5    Left Shoulder Internal Rotation  4+/5    Left Shoulder External Rotation  4/5        LYMPHEDEMA/ONCOLOGY QUESTIONNAIRE - 12/09/18 1617      Type   Cancer Type  Left breast cancer      Surgeries   Lumpectomy Date  09/01/18    Sentinel Lymph Node Biopsy Date  09/01/18    Number Lymph Nodes Removed  2      Treatment   Active Chemotherapy Treatment  No    Past Chemotherapy Treatment  Yes    Active Radiation Treatment  No    Past Radiation Treatment  Yes    Current Hormone Treatment  Yes      What other symptoms do you have   Are you Having Heaviness or Tightness  No    Are you having Pain  No      Lymphedema Assessments    Lymphedema Assessments  Upper extremities      Right Upper Extremity Lymphedema   15 cm Proximal to Olecranon Process  32.5 cm    10 cm Proximal to Olecranon Process  32.3 cm    Olecranon Process  29.5 cm    15 cm Proximal to Ulnar Styloid Process  26.3 cm    10 cm Proximal to Ulnar Styloid Process  24 cm    Just Proximal to Ulnar Styloid Process  18.7 cm    Across Hand at [employer]  20.5 cm    At [place] of 2nd Digit  6.6 cm    Other  7      Left Upper Extremity Lymphedema   15 cm Proximal to Olecranon Process  33.5 cm    10 cm Proximal to Olecranon Process  33.5 cm    Olecranon Process  29 cm    15 cm Proximal to Ulnar Styloid Process  26.7 cm    10 cm Proximal to Ulnar Styloid Process  23.6 cm    Just Proximal to Ulnar Styloid Process  18.5 cm    Across Hand at [employer]  20.6 cm    At [place] of 2nd Digit  6.6 cm          Quick Dash - 12/09/18 0001    Open a tight or new jar  Mild difficulty    Do heavy household chores (wash walls, wash floors)  Moderate difficulty    Carry a shopping bag or briefcase  No difficulty    Wash your back  Moderate difficulty    Use a knife to cut food  Mild difficulty    Recreational activities in which you take some force or impact through your arm, shoulder, or hand (golf, hammering, tennis)  Mild difficulty    During the past week, to what extent has your arm, shoulder or hand problem interfered with your normal social activities with family, friends, neighbors, or groups?  Not at all    During the past week, to what extent has your arm, shoulder or hand problem limited your work or other regular daily activities  Slightly    Arm, shoulder, or hand pain.  Mild    Tingling (pins and needles) in your arm, shoulder, or hand  Mild    Difficulty Sleeping  No difficulty    DASH Score  22.73 %        Objective measurements completed on examination: See above findings.              PT Education - 12/09/18 1659     Education Details  lymphedema, risk reduction, how to get bras and compression, initial HEP    Person(s) Educated  Patient    Methods  Explanation;Demonstration;Tactile cues;Verbal cues;Handout    Comprehension  Verbalized understanding;Returned demonstration;Verbal cues required;Tactile cues required          PT Long Term Goals - 12/09/18 1707      PT LONG TERM GOAL #1   Title  Pt will be screened for balance limitations with score of >45/56 on the BERG    Time  6    Period  Weeks    Status  New      PT LONG TERM GOAL #2   Title  Pt will demonstrate readiness for work by performing all strengthening activities in PT without limitations or pain    Time  6  Period  Weeks    Status  New      PT LONG TERM GOAL #3   Title  Pt will be educated on lymphedema risk reduction and use of compression garments for work    Time  6    Period  Weeks    Status  New      PT LONG TERM GOAL #4   Title  Pt will obtain appropriate bras post surgery    Time  6    Period  Weeks    Status  New             Plan - 12/09/18 1702    Clinical Impression Statement  Pt presents after left lumpectomy, radiation, and 2 lymph nodes wanting to work on strengthening to be ready for back to work.  Dr. Jana Hakim also mentioned getting a compression garment which pt is interested in doing through [employer] after discussion about cost of garments with medicare.  Overall pt is doing well with just some decreased ROM into flexion, extension, and horizontal abduction and with mild weakness into ER.  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    Personal Factors and Comorbidities  Comorbidity 2    Comorbidities  SLNB, radiation    Examination-Activity Limitations  Lift    Examination-Participation Restrictions  Other   work   Stability/Clinical Decision Making  Stable/Uncomplicated    Clinical Decision Making  Low    Rehab Potential  Excellent    PT Frequency  2x /  week    PT Duration  6 weeks    PT Treatment/Interventions  ADLs/Self Care Home Management;Therapeutic exercise;Therapeutic activities;Gait training;Neuromuscular re-education;Manual techniques;Passive range of motion    PT Next Visit Plan  script back for bras? get appt for bras?, script for compression for [facility] when back and sign [employer] form, begin left UE strength for work readiness    PT Home Exercise Plan  Access Code: [email or web address]    Recommended Other Services  bras, compression sleeve and gauntlet for work    [employer] and Agree with Plan of Care  Patient       Patient will benefit from skilled therapeutic intervention in order to improve the following deficits and impairments:  Decreased knowledge of use of DME, Postural dysfunction, Decreased activity tolerance, Decreased range of motion, Decreased strength, Decreased knowledge of precautions  Visit Diagnosis: Abnormal posture  Muscle weakness (generalized)  Stiffness of left shoulder, not elsewhere classified     Problem List Patient Active Problem List   Diagnosis Date Noted  . Genetic testing 03/24/2018  . Port-A-Cath in place 03/17/2018  . Family history of breast cancer   . Family history of throat cancer   . Family history of lung cancer   . Malignant neoplasm of lower-outer quadrant of left breast of female, estrogen receptor negative ([place]) 02/26/2018  . Malignant neoplasm of upper-outer quadrant of left breast in female, estrogen receptor positive ([place]) 02/26/2018  . Essential hypertension 10/23/2014  . Hyperlipidemia 10/23/2014    [name] 12/09/2018, 5:10 PM  [place], [place], [address] Phone: [phone]   Fax:  [phone]  Name: XARA PAULDING MRN: [id number] Date of Birth: 07/17/1950

## 2018-12-09 NOTE — Patient Instructions (Addendum)
Make an appointment at [facility] for bras [phone] I will get a prescription for the bras and the sleeve  .cr  Access Code: [email or web address]  URL: [email or web address]  Date: 12/09/2018  Prepared by: Shan Levans   Exercises  Standing Shoulder Abduction AAROM with Dowel - 10 reps - 1-3 sets - 1-2second hold - 1x daily - 7x weekly  Shoulder Flexion Overhead with Dowel - 10 reps - 1-3 sets - 1-2 second hold - 1x daily - 7x weekly  Standing Shoulder Extension with Dowel - 10 reps - 3 sets - 1x daily - 7x weekly  Doorway Pec Stretch at 90 Degrees Abduction - 10 reps - 1-3 sets - 20-30 seconds hold - 1x daily - 7x weekly

## 2018-12-14 ENCOUNTER — Ambulatory Visit: Payer: Medicare Other

## 2018-12-16 ENCOUNTER — Encounter: Payer: Medicare Other | Admitting: Rehabilitation

## 2018-12-22 ENCOUNTER — Other Ambulatory Visit: Payer: Self-pay

## 2018-12-22 ENCOUNTER — Encounter: Payer: Self-pay | Admitting: Rehabilitation

## 2018-12-22 ENCOUNTER — Ambulatory Visit: Payer: Medicare Other | Admitting: Rehabilitation

## 2018-12-22 DIAGNOSIS — R293 Abnormal posture: Secondary | ICD-10-CM

## 2018-12-22 DIAGNOSIS — M25612 Stiffness of left shoulder, not elsewhere classified: Secondary | ICD-10-CM

## 2018-12-22 DIAGNOSIS — C50512 Malignant neoplasm of lower-outer quadrant of left female breast: Secondary | ICD-10-CM

## 2018-12-22 DIAGNOSIS — M6281 Muscle weakness (generalized): Secondary | ICD-10-CM

## 2018-12-22 NOTE — Patient Instructions (Signed)
Access Code: [email or web address]  URL: [email or web address]  Date: 12/22/2018  Prepared by: Shan Levans   Exercises  Standing Shoulder Single Arm Flexion with Anchored Resistance - 10-20 reps - 1 sets - 1x daily - 3-4x weekly  Shoulder External Rotation with Anchored Resistance - 10-20 reps - 1 sets - 1x daily - 3-4x weekly  Single Arm Shoulder Extension - Palm Forward - 10-20 reps - 1 sets - 1x daily - 3-4x weekly  Shoulder Internal Rotation with Resistance - 10-20 reps - 1 sets - 1x daily - 3-4x weekly  Standing Single Arm Shoulder Abduction with Resistance - 10-20 reps - 1 sets - 1x daily - 3-4x weekly

## 2018-12-22 NOTE — Therapy (Signed)
[place], [place], [address] Phone: [phone]   Fax:  [phone]  Physical Therapy Treatment  Patient Details  Name: Emily Chambers MRN: [id number] Date of Birth: 1950-02-26 Referring Provider (PT): Dr. Jana Hakim   Encounter Date: 12/22/2018  PT End of Session - 12/22/18 1636    Visit Number  2    Number of Visits  7    Date for PT Re-Evaluation  01/20/19    Authorization Type  Medicare [employer]HC    PT Start Time  1600    PT Stop Time  1630    PT Time Calculation (min)  30 min    Activity Tolerance  Patient tolerated treatment well    Behavior During Therapy  [facility] for tasks assessed/performed       Past Medical History:  Diagnosis Date  . Family history of breast cancer   . Family history of lung cancer   . Family history of throat cancer   . High cholesterol   . Hypertension   . Kidney stone   . Kidney stones   . UTI (lower urinary tract infection)     Past Surgical History:  Procedure Laterality Date  . BREAST EXCISIONAL BIOPSY Left 1999,2005   cysts removed  . BREAST LUMPECTOMY WITH RADIOACTIVE SEED AND SENTINEL LYMPH NODE BIOPSY Left 09/01/2018   Procedure: LEFT BREAST  RADIOACTIVE SEED LUMPECTOMY X2 AND LEFT SENTINEL LYMPH NODE BIOPSY AND MAPPING;  Surgeon: Stark Klein, MD;  Location: [place];  Service: General;  Laterality: Left;  . BREAST SURGERY Left    cyst and biopsy  . PORTACATH PLACEMENT Left 03/10/2018   Procedure: INSERTION PORT-A-CATH;  Surgeon: Stark Klein, MD;  Location: [place];  Service: General;  Laterality: Left;  . RADIOACTIVE SEED GUIDED EXCISIONAL BREAST BIOPSY Right 09/01/2018   Procedure: RADIOACTIVE SEED GUIDED EXCISIONAL RIGHT BREAST BIOPSY;  Surgeon: Stark Klein, MD;  Location: [place];  Service: General;  Laterality: Right;    There were no vitals filed for this visit.  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  Radiation completed to the left breast on 11/02/18.  On anastrozole.  Other include HTN, cholesterol.    Patient Stated Goals  get ready for work    Currently in Pain?  No/denies                       [facility] Adult PT Treatment/Exercise - 12/22/18 0001      Exercises   Exercises  Shoulder      Shoulder Exercises: Standing   Other Standing Exercises  4 way shoulder yellow band x 15 each left with cueing for all with band anchored in doorway.  Also added standing abduction wihth the band and demonstrated how pt could do weighted abduction as an alternative.       Shoulder Exercises: Pulleys   Flexion  2 minutes    Flexion Limitations  with cueing for performance and to slow down      Shoulder Exercises: Therapy Ball   Flexion  Both;10 reps      Manual Therapy  Manual Therapy  Edema management    Edema Management  gave you prescription for bras and had her sign [employer] form to send to [facility]              PT Education - 12/22/18 1636    Education Details  HEP    Person(s) Educated  Patient    Methods  Explanation;Demonstration;Tactile cues;Verbal cues;Handout    Comprehension  Verbalized understanding;Returned demonstration;Verbal cues required          PT Long Term Goals - 12/22/18 1639      PT LONG TERM GOAL #1   Title  Pt will be screened for balance limitations with score of >45/56 on the BERG    Status  On-going      PT LONG TERM GOAL #2   Title  Pt will demonstrate readiness for work by performing all strengthening activities in PT without limitations or pain    Status  Partially Met      PT LONG TERM GOAL #3    Title  Pt will be educated on lymphedema risk reduction and use of compression garments for work    Status  Partially Met      PT LONG TERM GOAL #4   Title  Pt will obtain appropriate bras post surgery    Status  On-going            Plan - 12/22/18 1636    Clinical Impression Statement  Started HEP today with education on 4 way shoulder and abduction included.  Given yellow and red bands for progression.  Pt did very will with minimal cueing needed and after discussion we decided that she could work on these for 3 weeks and then check back in for ROM and strength assessment and to see if any additional needs are present.  Faxed [facility]ight to [facility] and pt has already been measured for garments.  Pt also has prescription for bras if wanted.    PT Treatment/Interventions  ADLs/Self Care Home Management;Therapeutic exercise;Therapeutic activities;Gait training;Neuromuscular re-education;Manual techniques;Passive range of motion    PT Next Visit Plan  assess ROM and strength left UE.  Is work getting any easier? Any more strengthening needs? balance screen still needed per goal? any questions on garments if they have arrived?    PT Home Exercise Plan  Access Code: [email or web address], Access Code: [email or web address] for bands    Consulted and Agree with Plan of Care  Patient       Patient will benefit from skilled therapeutic intervention in order to improve the following deficits and impairments:     Visit Diagnosis: Muscle weakness (generalized)  Abnormal posture  Stiffness of left shoulder, not elsewhere classified  Malignant neoplasm of lower-outer quadrant of left breast of female, estrogen receptor positive ([place])     Problem List Patient Active Problem List   Diagnosis Date Noted  . Genetic testing 03/24/2018  . Port-A-Cath in place 03/17/2018  . Family history of breast cancer   . Family history of throat cancer   . Family history of lung cancer   . Malignant neoplasm of lower-outer quadrant of  left breast of female, estrogen receptor negative ([place]) 02/26/2018  . Malignant neoplasm of upper-outer quadrant of left breast in female, estrogen receptor positive ([place]) 02/26/2018  . Essential hypertension 10/23/2014  . Hyperlipidemia 10/23/2014    [name] 12/22/2018, 4:41 PM  [place], [place], [address] Phone: [phone]   Fax:  [phone]  Name: Emily Chambers MRN: [id number] Date of Birth: May 26, 1950

## 2018-12-27 ENCOUNTER — Encounter: Payer: Medicare Other | Admitting: Rehabilitation

## 2018-12-29 ENCOUNTER — Encounter: Payer: Medicare Other | Admitting: Rehabilitation

## 2018-12-31 ENCOUNTER — Other Ambulatory Visit: Payer: Self-pay

## 2018-12-31 ENCOUNTER — Encounter (HOSPITAL_BASED_OUTPATIENT_CLINIC_OR_DEPARTMENT_OTHER): Payer: Self-pay | Admitting: *Deleted

## 2019-01-03 ENCOUNTER — Other Ambulatory Visit (HOSPITAL_COMMUNITY)
Admission: RE | Admit: 2019-01-03 | Discharge: 2019-01-03 | Disposition: A | Discharge status: Home / Self Care | Payer: Medicare Other | Source: Ambulatory Visit | Attending: General Surgery | Admitting: General Surgery

## 2019-01-03 ENCOUNTER — Encounter: Payer: Medicare Other | Admitting: Rehabilitation

## 2019-01-03 DIAGNOSIS — Z01812 Encounter for preprocedural laboratory examination: Secondary | ICD-10-CM | POA: Diagnosis present

## 2019-01-03 DIAGNOSIS — Z20828 Contact with and (suspected) exposure to other viral communicable diseases: Secondary | ICD-10-CM | POA: Insufficient documentation

## 2019-01-03 NOTE — Progress Notes (Signed)

## 2019-01-04 LAB — NOVEL CORONAVIRUS, NAA (HOSP ORDER, SEND-OUT TO REF LAB; TAT 18-24 HRS): SARS-CoV-2, NAA: NOT DETECTED

## 2019-01-05 ENCOUNTER — Encounter: Payer: Medicare Other | Admitting: Rehabilitation

## 2019-01-06 ENCOUNTER — Ambulatory Visit (HOSPITAL_BASED_OUTPATIENT_CLINIC_OR_DEPARTMENT_OTHER)
Admission: RE | Admit: 2019-01-06 | Discharge: 2019-01-06 | Disposition: A | Discharge status: Home / Self Care | Payer: Medicare Other | Attending: General Surgery | Admitting: General Surgery

## 2019-01-06 ENCOUNTER — Ambulatory Visit (HOSPITAL_BASED_OUTPATIENT_CLINIC_OR_DEPARTMENT_OTHER): Payer: Medicare Other | Admitting: Anesthesiology

## 2019-01-06 ENCOUNTER — Encounter (HOSPITAL_BASED_OUTPATIENT_CLINIC_OR_DEPARTMENT_OTHER)
Admission: RE | Disposition: A | Discharge status: Home / Self Care | Payer: Self-pay | Source: Home / Self Care | Attending: General Surgery

## 2019-01-06 ENCOUNTER — Other Ambulatory Visit: Payer: Self-pay

## 2019-01-06 ENCOUNTER — Encounter (HOSPITAL_BASED_OUTPATIENT_CLINIC_OR_DEPARTMENT_OTHER): Payer: Self-pay

## 2019-01-06 DIAGNOSIS — Z9221 Personal history of antineoplastic chemotherapy: Secondary | ICD-10-CM | POA: Diagnosis not present

## 2019-01-06 DIAGNOSIS — Z79899 Other long term (current) drug therapy: Secondary | ICD-10-CM | POA: Insufficient documentation

## 2019-01-06 DIAGNOSIS — Z801 Family history of malignant neoplasm of trachea, bronchus and lung: Secondary | ICD-10-CM | POA: Diagnosis not present

## 2019-01-06 DIAGNOSIS — Z79811 Long term (current) use of aromatase inhibitors: Secondary | ICD-10-CM | POA: Insufficient documentation

## 2019-01-06 DIAGNOSIS — Z923 Personal history of irradiation: Secondary | ICD-10-CM | POA: Diagnosis not present

## 2019-01-06 DIAGNOSIS — E7800 Pure hypercholesterolemia, unspecified: Secondary | ICD-10-CM | POA: Diagnosis not present

## 2019-01-06 DIAGNOSIS — Z452 Encounter for adjustment and management of vascular access device: Secondary | ICD-10-CM | POA: Diagnosis present

## 2019-01-06 DIAGNOSIS — Z8249 Family history of ischemic heart disease and other diseases of the circulatory system: Secondary | ICD-10-CM | POA: Diagnosis not present

## 2019-01-06 DIAGNOSIS — Z7982 Long term (current) use of aspirin: Secondary | ICD-10-CM | POA: Diagnosis not present

## 2019-01-06 DIAGNOSIS — Z803 Family history of malignant neoplasm of breast: Secondary | ICD-10-CM | POA: Diagnosis not present

## 2019-01-06 DIAGNOSIS — C50912 Malignant neoplasm of unspecified site of left female breast: Secondary | ICD-10-CM | POA: Diagnosis not present

## 2019-01-06 HISTORY — PX: PORT-A-CATH REMOVAL: SHX5289

## 2019-01-06 HISTORY — DX: Malignant (primary) neoplasm, unspecified: C80.1

## 2019-01-06 SURGERY — REMOVAL PORT-A-CATH
Anesthesia: Monitor Anesthesia Care | Site: Chest | Laterality: Left

## 2019-01-06 MED ORDER — CHLORHEXIDINE GLUCONATE CLOTH 2 % EX PADS: 6.0000 | MEDICATED_PAD | Freq: Once | CUTANEOUS | Status: DC

## 2019-01-06 MED ORDER — ACETAMINOPHEN 500 MG PO TABS
1000.0000 mg | ORAL_TABLET | Freq: Once | ORAL | Status: AC
  Administered 2019-01-06: 1000 mg via ORAL

## 2019-01-06 MED ORDER — BUPIVACAINE HCL (PF) 0.25 % IJ SOLN
INTRAMUSCULAR | Status: DC | PRN
  Administered 2019-01-06: 5 mL

## 2019-01-06 MED ORDER — MIDAZOLAM HCL 2 MG/2ML IJ SOLN: 1.0000 mg | INTRAMUSCULAR | Status: DC | PRN

## 2019-01-06 MED ORDER — CEFAZOLIN SODIUM-DEXTROSE 2-4 GM/100ML-% IV SOLN
INTRAVENOUS | Status: AC
  Filled 2019-01-06: qty 100

## 2019-01-06 MED ORDER — FENTANYL CITRATE (PF) 100 MCG/2ML IJ SOLN: 50.0000 ug | INTRAMUSCULAR | Status: DC | PRN

## 2019-01-06 MED ORDER — LIDOCAINE-EPINEPHRINE (PF) 1 %-1:200000 IJ SOLN
INTRAMUSCULAR | Status: DC | PRN
  Administered 2019-01-06: 5 mL

## 2019-01-06 MED ORDER — ACETAMINOPHEN 500 MG PO TABS
ORAL_TABLET | ORAL | Status: AC
  Filled 2019-01-06: qty 2

## 2019-01-06 MED ORDER — OXYCODONE HCL 5 MG PO TABS: 5.0000 mg | ORAL_TABLET | Freq: Four times a day (QID) | ORAL | 0 refills | Status: DC | PRN

## 2019-01-06 MED ORDER — CEFAZOLIN SODIUM-DEXTROSE 2-4 GM/100ML-% IV SOLN
2.0000 g | INTRAVENOUS | Status: AC
  Administered 2019-01-06: 2 g via INTRAVENOUS

## 2019-01-06 MED ORDER — KETOROLAC TROMETHAMINE 30 MG/ML IJ SOLN: 30.0000 mg | Freq: Once | INTRAMUSCULAR | Status: DC | PRN

## 2019-01-06 MED ORDER — OXYCODONE HCL 5 MG PO TABS: 5.0000 mg | ORAL_TABLET | Freq: Once | ORAL | Status: DC | PRN

## 2019-01-06 MED ORDER — PROPOFOL 10 MG/ML IV BOLUS
INTRAVENOUS | Status: DC | PRN
  Administered 2019-01-06: 30 ug via INTRAVENOUS
  Administered 2019-01-06: 20 ug via INTRAVENOUS
  Administered 2019-01-06: 30 ug via INTRAVENOUS

## 2019-01-06 MED ORDER — FENTANYL CITRATE (PF) 100 MCG/2ML IJ SOLN: 25.0000 ug | INTRAMUSCULAR | Status: DC | PRN

## 2019-01-06 MED ORDER — ACETAMINOPHEN 500 MG PO TABS: 1000.0000 mg | ORAL_TABLET | ORAL | Status: DC

## 2019-01-06 MED ORDER — LACTATED RINGERS IV SOLN
INTRAVENOUS | Status: DC
  Administered 2019-01-06: 12:00:00 via INTRAVENOUS

## 2019-01-06 MED ORDER — OXYCODONE HCL 5 MG/5ML PO SOLN: 5.0000 mg | Freq: Once | ORAL | Status: DC | PRN

## 2019-01-06 SURGICAL SUPPLY — 36 items
ADH SKN CLS APL DERMABOND .7 (GAUZE/BANDAGES/DRESSINGS) ×1
APL PRP STRL LF DISP 70% ISPRP (MISCELLANEOUS) ×1
BLADE HEX COATED 2.75 (ELECTRODE) ×3 IMPLANT
BLADE SURG 15 STRL LF DISP TIS (BLADE) ×1 IMPLANT
BLADE SURG 15 STRL SS (BLADE) ×3
CANISTER SUCT 1200ML W/VALVE (MISCELLANEOUS) IMPLANT
CHLORAPREP W/TINT 26 (MISCELLANEOUS) ×3 IMPLANT
COVER BACK TABLE REUSABLE LG (DRAPES) ×3 IMPLANT
COVER MAYO STAND REUSABLE (DRAPES) ×3 IMPLANT
COVER WAND RF STERILE (DRAPES) IMPLANT
DECANTER SPIKE VIAL GLASS SM (MISCELLANEOUS) ×2 IMPLANT
DERMABOND ADVANCED (GAUZE/BANDAGES/DRESSINGS) ×2
DERMABOND ADVANCED .7 DNX12 (GAUZE/BANDAGES/DRESSINGS) ×1 IMPLANT
DRAPE LAPAROTOMY 100X72 PEDS (DRAPES) ×3 IMPLANT
DRAPE UTILITY XL STRL (DRAPES) ×3 IMPLANT
ELECT REM PT RETURN 9FT ADLT (ELECTROSURGICAL) ×3
ELECTRODE REM PT RTRN 9FT ADLT (ELECTROSURGICAL) ×1 IMPLANT
GLOVE BIO SURGEON STRL SZ 6 (GLOVE) ×3 IMPLANT
GLOVE BIOGEL PI IND STRL 6.5 (GLOVE) ×1 IMPLANT
GLOVE BIOGEL PI INDICATOR 6.5 (GLOVE) ×2
GOWN STRL REUS W/ TWL LRG LVL3 (GOWN DISPOSABLE) ×1 IMPLANT
GOWN STRL REUS W/TWL 2XL LVL3 (GOWN DISPOSABLE) ×3 IMPLANT
GOWN STRL REUS W/TWL LRG LVL3 (GOWN DISPOSABLE) ×3
NDL HYPO 25X1 1.5 SAFETY (NEEDLE) ×1 IMPLANT
NEEDLE HYPO 25X1 1.5 SAFETY (NEEDLE) ×3 IMPLANT
NS IRRIG 1000ML POUR BTL (IV SOLUTION) IMPLANT
PACK BASIN DAY SURGERY FS (CUSTOM PROCEDURE TRAY) ×3 IMPLANT
PENCIL SMOKE EVACUATOR (MISCELLANEOUS) ×3 IMPLANT
SUT MNCRL AB 4-0 PS2 18 (SUTURE) ×3 IMPLANT
SUT VIC AB 3-0 SH 27 (SUTURE) ×3
SUT VIC AB 3-0 SH 27X BRD (SUTURE) ×1 IMPLANT
SYR CONTROL 10ML LL (SYRINGE) ×3 IMPLANT
TOWEL GREEN STERILE FF (TOWEL DISPOSABLE) ×3 IMPLANT
TUBE CONNECTING 20'X1/4 (TUBING)
TUBE CONNECTING 20X1/4 (TUBING) IMPLANT
YANKAUER SUCT BULB TIP NO VENT (SUCTIONS) IMPLANT

## 2019-01-06 NOTE — Anesthesia Postprocedure Evaluation (Signed)
Anesthesia Post Note  Patient: Emily Chambers  Procedure(s) Performed: REMOVAL PORT-A-CATH (Left Chest)     Patient location during evaluation: PACU Anesthesia Type: MAC Level of consciousness: awake and alert Pain management: pain level controlled Vital Signs Assessment: post-procedure vital signs reviewed and stable Respiratory status: spontaneous breathing, nonlabored ventilation and respiratory function stable Cardiovascular status: blood pressure returned to baseline and stable Postop Assessment: no apparent nausea or vomiting Anesthetic complications: no    Last Vitals:  Vitals:   01/06/19 1326 01/06/19 1330  BP: 112/77 107/74  Pulse: 73 67  Resp: 18 10  Temp: 36.5 C   SpO2: 98% 99%    Last Pain:  Vitals:   01/06/19 1326  TempSrc:   PainSc: 0-No pain                 Jarome Matin Demi Trieu

## 2019-01-06 NOTE — Op Note (Signed)
  PRE-OPERATIVE DIAGNOSIS:  un-needed Port-A-Cath for left breast cancer  POST-OPERATIVE DIAGNOSIS:  Same   PROCEDURE:  Procedure(s):  REMOVAL PORT-A-CATH  SURGEON:  Surgeon(s):  Stark Klein, MD  ANESTHESIA:   MAC + local  EBL:   Minimal  SPECIMEN:  None  Complications : none known  Procedure:   Pt was  identified in the holding area and taken to the operating room where she was placed supine on the operating room table.  MAC anesthesia was induced.  The left upper chest was prepped and draped.  The prior incision was anesthetized with local anesthetic.  The incision was opened with a #15 blade.  The subcutaneous tissue was divided with the cautery.  The port was identified and the capsule opened.  The four 2-0 prolene sutures were removed.  The port was then removed and pressure held on the tract.  The catheter appeared intact without evidence of breakage, length was 25.5 cm.  The wound was inspected for hemostasis, which was achieved with cautery.  The wound was closed with 3-0 vicryl deep dermal interrupted sutures and 4-0 Monocryl running subcuticular suture.  The wound was cleaned, dried, and dressed with dermabond.  The patient was awakened from anesthesia and taken to the PACU in stable condition.  Needle, sponge, and instrument counts are correct.

## 2019-01-06 NOTE — Anesthesia Preprocedure Evaluation (Signed)
Anesthesia Evaluation  Patient identified by MRN, date of birth, ID band Patient awake    Reviewed: Allergy & Precautions, NPO status , Patient's Chart, lab work & pertinent test results  Airway Mallampati: II  TM Distance: >3 FB Neck ROM: Full    Dental  (+) Dental Advisory Given   Pulmonary neg pulmonary ROS,    Pulmonary exam normal breath sounds clear to auscultation       Cardiovascular hypertension, Pt. on medications negative cardio ROS Normal cardiovascular exam Rhythm:Regular Rate:Normal  Echo 1/20 Study Conclusions  - Left ventricle: The cavity size was normal. There was mild   concentric hypertrophy. Systolic function was normal. The   estimated ejection fraction was in the range of 60% to 65%. Wall   motion was normal; there were no regional wall motion   abnormalities. Doppler parameters are consistent with abnormal   left ventricular relaxation (grade 1 diastolic dysfunction).   Neuro/Psych negative neurological ROS  negative psych ROS   GI/Hepatic negative GI ROS, Neg liver ROS,   Endo/Other  negative endocrine ROS  Renal/GU Renal disease   Breast ca     Musculoskeletal negative musculoskeletal ROS (+)   Abdominal (+) + obese,   Peds  Hematology negative hematology ROS (+)   Anesthesia Other Findings HLD  Reproductive/Obstetrics negative OB ROS                             Anesthesia Physical  Anesthesia Plan  ASA: III  Anesthesia Plan: MAC   Post-op Pain Management:    Induction:   PONV Risk Score and Plan: 2 and Ondansetron, Dexamethasone, Treatment may vary due to age or medical condition and Midazolam  Airway Management Planned:   Additional Equipment: None  Intra-op Plan:   Post-operative Plan:   Informed Consent: I have reviewed the patients History and Physical, chart, labs and discussed the procedure including the risks, benefits and  alternatives for the proposed anesthesia with the patient or authorized representative who has indicated his/her understanding and acceptance.       Plan Discussed with: CRNA  Anesthesia Plan Comments:         Anesthesia Quick Evaluation

## 2019-01-06 NOTE — H&P (Signed)
Emily Chambers is an 68 y.o. female.   Chief Complaint: port in place HPI:  Pt is a 68 yo F s/p surgery, chemo, and radiation for left breast cancer.  She desires port removal.    Past Medical History:  Diagnosis Date  . Cancer [facility])    left breast cancer  . Family history of breast cancer   . Family history of lung cancer   . Family history of throat cancer   . High cholesterol   . Hypertension   . Kidney stone   . Kidney stones   . UTI (lower urinary tract infection)     Past Surgical History:  Procedure Laterality Date  . BREAST EXCISIONAL BIOPSY Left 1999,2005   cysts removed  . BREAST LUMPECTOMY WITH RADIOACTIVE SEED AND SENTINEL LYMPH NODE BIOPSY Left 09/01/2018   Procedure: LEFT BREAST  RADIOACTIVE SEED LUMPECTOMY X2 AND LEFT SENTINEL LYMPH NODE BIOPSY AND MAPPING;  Surgeon: Stark Klein, MD;  Location: [place];  Service: General;  Laterality: Left;  . BREAST SURGERY Left    cyst and biopsy  . PORTACATH PLACEMENT Left 03/10/2018   Procedure: INSERTION PORT-A-CATH;  Surgeon: Stark Klein, MD;  Location: [place];  Service: General;  Laterality: Left;  . RADIOACTIVE SEED GUIDED EXCISIONAL BREAST BIOPSY Right 09/01/2018   Procedure: RADIOACTIVE SEED GUIDED EXCISIONAL RIGHT BREAST BIOPSY;  Surgeon: Stark Klein, MD;  Location: [place];  Service: General;  Laterality: Right;    Family History  Problem Relation Age of Onset  . Hypertension Mother   . Heart disease Mother   . Dementia Mother   . Healthy Father   . Colon cancer Maternal Grandmother   . Cancer Paternal Grandmother        unk type  . Lung cancer Cousin        66s  . Breast cancer Cousin        29s  . Breast cancer Cousin        68s  . Breast cancer Cousin   . Cancer Paternal Aunt        unk type d. 39, possibly pancreatic  . Cancer Paternal Aunt        unk type d. 2s  . Cancer Paternal Uncle        unk type   . Cancer Paternal Uncle        unk  type d. 56s  . Cancer Maternal Aunt        unk type, d. 44s  . Throat cancer Maternal Uncle        d. 74s  . Ovarian cancer Neg Hx    Social History:  reports that she has never smoked. She has never used smokeless tobacco. She reports current alcohol use. She reports that she does not use drugs.  Allergies: No Known Allergies  Medications Prior to Admission  Medication Sig Dispense Refill  . amLODipine (NORVASC) 10 MG tablet Take 10 mg by mouth every morning.     Marland Kitchen anastrozole (ARIMIDEX) 1 MG tablet Take 1 tablet (1 mg total) by mouth daily. 90 tablet 4  . aspirin 81 MG chewable tablet Chew 81 mg by mouth daily.    Marland Kitchen losartan (COZAAR) 100 MG tablet Take 100 mg by mouth every morning.     . pravastatin (PRAVACHOL) 20 MG tablet Take 20 mg by mouth every morning.     . lidocaine-prilocaine (EMLA) cream Apply 1 application topically as needed. 30 g 0  No results found for this or any previous visit (from the past 48 hour(s)). No results found.  Review of Systems  All other systems reviewed and are negative.   Blood pressure 125/74, pulse 87, temperature 98.2 F (36.8 C), temperature source Oral, resp. rate 18, height 5\' 2"  (1.575 m), weight 81.5 kg, SpO2 97 %. Physical Exam  Constitutional: She is oriented to person, place, and time. She appears well-developed and well-nourished. No distress.  HENT:  Head: Normocephalic and atraumatic.  Eyes: Pupils are equal, round, and reactive to light. Conjunctivae are normal. Right eye exhibits no discharge. Left eye exhibits no discharge. No scleral icterus.  Neck: Normal range of motion. No tracheal deviation present. No thyromegaly present.  Cardiovascular: Normal rate and regular rhythm.  Respiratory: Effort normal. No respiratory distress.  Left sided port in place.    GI: Soft. Bowel sounds are normal.  Musculoskeletal: Normal range of motion.  Lymphadenopathy:    She has no cervical adenopathy.  Neurological: She is alert and  oriented to person, place, and time. Coordination normal.  Skin: Skin is warm and dry. She is not diaphoretic.  Psychiatric: She has a normal mood and affect. Her behavior is normal. Judgment and thought content normal.     Assessment/Plan Left breast cancer, s/p chemo.  Port in place. Plan port removal. Discussed surgery.    Stark Klein, MD 01/06/2019, 12:21 PM

## 2019-01-06 NOTE — Discharge Instructions (Addendum)
[place] Office Phone Number [phone]   POST OP INSTRUCTIONS  Always review your discharge instruction sheet given to you by the facility where your surgery was performed.  IF YOU HAVE DISABILITY OR FAMILY LEAVE FORMS, YOU MUST BRING THEM TO THE OFFICE FOR PROCESSING.  DO NOT GIVE THEM TO YOUR DOCTOR.  1. A prescription for pain medication may be given to you upon discharge.  Take your pain medication as prescribed, if needed.  If narcotic pain medicine is not needed, then you may take acetaminophen (Tylenol) or ibuprofen (Advil) as needed. 2. Take your usually prescribed medications unless otherwise directed 3. If you need a refill on your pain medication, please contact your pharmacy.  They will contact our office to request authorization.  Prescriptions will not be filled after 5pm or on week-ends. 4. You should eat very light the first 24 hours after surgery, such as soup, crackers, pudding, etc.  Resume your normal diet the day after surgery 5. It is common to experience some constipation if taking pain medication after surgery.  Increasing fluid intake and taking a stool softener will usually help or prevent this problem from occurring.  A mild laxative (Milk of Magnesia or Miralax) should be taken according to package directions if there are no bowel movements after 48 hours. 6. You may shower in 48 hours.  The surgical glue will flake off in 2-3 weeks.   7. ACTIVITIES:  No strenuous activity or heavy lifting for 1 week.   a. You may drive when you no longer are taking prescription pain medication, you can comfortably wear a seatbelt, and you can safely maneuver your car and apply brakes. b. RETURN TO WORK:  __________1 week_______________ Dennis Bast should see your doctor in the office for a follow-up appointment approximately three-four weeks after your surgery.    WHEN TO CALL YOUR DOCTOR: 1. Fever over 101.0 2. Nausea and/or vomiting. 3. Extreme swelling or  bruising. 4. Continued bleeding from incision. 5. Increased pain, redness, or drainage from the incision.  The clinic staff is available to answer your questions during regular business hours.  Please dont hesitate to call and ask to speak to one of the nurses for clinical concerns.  If you have a medical emergency, go to the nearest emergency room or call 911.  A surgeon from [facility] is always on call at the hospital.  For further questions, please visit [email or web address]     Post Anesthesia Home Care Instructions  Activity: Get plenty of rest for the remainder of the day. A responsible individual must stay with you for 24 hours following the procedure.  For the next 24 hours, DO NOT: -Drive a car -Paediatric nurse -Drink alcoholic beverages -Take any medication unless instructed by your physician -Make any legal decisions or sign important papers.  Meals: Start with liquid foods such as gelatin or soup. Progress to regular foods as tolerated. Avoid greasy, spicy, heavy foods. If nausea and/or vomiting occur, drink only clear liquids until the nausea and/or vomiting subsides. Call your physician if vomiting continues.  Special Instructions/Symptoms: Your throat may feel dry or sore from the anesthesia or the breathing tube placed in your throat during surgery. If this causes discomfort, gargle with warm salt water. The discomfort should disappear within 24 hours.  If you had a scopolamine patch placed behind your ear for the management of post- operative nausea and/or vomiting:  1. The medication in the patch is effective for 72 hours, after which it  should be removed.  Wrap patch in a tissue and discard in the trash. Wash hands thoroughly with soap and water. 2. You may remove the patch earlier than 72 hours if you experience unpleasant side effects which may include dry mouth, dizziness or visual disturbances. 3. Avoid touching the patch. Wash your hands  with soap and water after contact with the patch.  No tylenol until after 6pm today.     Information for Discharge Teaching: EXPAREL (bupivacaine liposome injectable suspension)   Your surgeon or anesthesiologist gave you EXPAREL(bupivacaine) to help control your pain after surgery.   EXPAREL is a local anesthetic that provides pain relief by numbing the tissue around the surgical site.  EXPAREL is designed to release pain medication over time and can control pain for up to 72 hours.  Depending on how you respond to EXPAREL, you may require less pain medication during your recovery.  Possible side effects:  Temporary loss of sensation or ability to move in the area where bupivacaine was injected.  Nausea, vomiting, constipation  Rarely, numbness and tingling in your mouth or lips, lightheadedness, or anxiety may occur.  Call your doctor right away if you think you may be experiencing any of these sensations, or if you have other questions regarding possible side effects.  Follow all other discharge instructions given to you by your surgeon or nurse. Eat a healthy diet and drink plenty of water or other fluids.  If you return to the hospital for any reason within 96 hours following the administration of EXPAREL, it is important for health care providers to know that you have received this anesthetic. A teal colored band has been placed on your arm with the date, time and amount of EXPAREL you have received in order to alert and inform your health care providers. Please leave this armband in place for the full 96 hours following administration, and then you may remove the band.

## 2019-01-06 NOTE — Transfer of Care (Signed)
Immediate Anesthesia Transfer of Care Note  Patient: ROSEALIE REACH  Procedure(s) Performed: REMOVAL PORT-A-CATH (Left Chest)  Patient Location: PACU  Anesthesia Type:MAC  Level of Consciousness: awake, alert  and oriented  Airway & Oxygen Therapy: Patient Spontanous Breathing and Patient connected to face mask oxygen  Post-op Assessment: Report given to RN and Post -op Vital signs reviewed and stable  Post vital signs: Reviewed and stable  Last Vitals:  Vitals Value Taken Time  BP    Temp    Pulse 77 01/06/19 1325  Resp    SpO2 97 % 01/06/19 1325  Vitals shown include unvalidated device data.  Last Pain:  Vitals:   01/06/19 1150  TempSrc: Oral  PainSc: 0-No pain      Patients Stated Pain Goal: 3 (16/57/90 3833)  Complications: No apparent anesthesia complications

## 2019-01-07 ENCOUNTER — Encounter (HOSPITAL_BASED_OUTPATIENT_CLINIC_OR_DEPARTMENT_OTHER): Payer: Self-pay | Admitting: General Surgery

## 2019-01-10 NOTE — Addendum Note (Signed)
Addendum  created 01/10/19 0756 by Pervis Hocking, DO   [place] recorded in [place], [place] filed

## 2019-01-12 ENCOUNTER — Ambulatory Visit: Payer: Medicare Other | Attending: Oncology | Admitting: Rehabilitation

## 2019-01-12 ENCOUNTER — Encounter: Payer: Self-pay | Admitting: Rehabilitation

## 2019-01-12 ENCOUNTER — Other Ambulatory Visit: Payer: Self-pay

## 2019-01-12 DIAGNOSIS — M6281 Muscle weakness (generalized): Secondary | ICD-10-CM | POA: Diagnosis present

## 2019-01-12 DIAGNOSIS — R293 Abnormal posture: Secondary | ICD-10-CM | POA: Diagnosis present

## 2019-01-12 DIAGNOSIS — C50412 Malignant neoplasm of upper-outer quadrant of left female breast: Secondary | ICD-10-CM

## 2019-01-12 DIAGNOSIS — M25612 Stiffness of left shoulder, not elsewhere classified: Secondary | ICD-10-CM | POA: Diagnosis present

## 2019-01-12 DIAGNOSIS — Z170 Estrogen receptor positive status [ER+]: Secondary | ICD-10-CM | POA: Diagnosis present

## 2019-01-12 DIAGNOSIS — C50512 Malignant neoplasm of lower-outer quadrant of left female breast: Secondary | ICD-10-CM | POA: Diagnosis present

## 2019-01-12 NOTE — Therapy (Addendum)
Emily Chambers, [place], [address] Phone: [phone]   Fax:  [phone]  Physical Therapy Treatment  Patient Details  Name: Emily Chambers MRN: [id number] Date of Birth: [birth year] Referring Provider (PT): Dr. Jana Hakim   Encounter Date: 01/12/2019  PT End of Session - 01/12/19 1441    Visit Number  3    Number of Visits  7    Date for PT Re-Evaluation  01/20/19    PT Start Time  1829    PT Stop Time  9371   DC visit check in   PT Time Calculation (min)  29 min    Activity Tolerance  Patient tolerated treatment well    Behavior During Therapy  [facility] for tasks assessed/performed       Past Medical History:  Diagnosis Date  . Cancer [facility])    left breast cancer  . Family history of breast cancer   . Family history of lung cancer   . Family history of throat cancer   . High cholesterol   . Hypertension   . Kidney stone   . Kidney stones   . UTI (lower urinary tract infection)     Past Surgical History:  Procedure Laterality Date  . BREAST EXCISIONAL BIOPSY Left 1999,2005   cysts removed  . BREAST LUMPECTOMY WITH RADIOACTIVE SEED AND SENTINEL LYMPH NODE BIOPSY Left 09/01/2018   Procedure: LEFT BREAST  RADIOACTIVE SEED LUMPECTOMY X2 AND LEFT SENTINEL LYMPH NODE BIOPSY AND MAPPING;  Surgeon: Stark Klein, MD;  Location: [place];  Service: General;  Laterality: Left;  . BREAST SURGERY Left    cyst and biopsy  . PORT-A-CATH REMOVAL Left 01/06/2019   Procedure: REMOVAL PORT-A-CATH;  Surgeon: Stark Klein, MD;  Location: [place];  Service: General;  Laterality: Left;  . PORTACATH PLACEMENT Left 03/10/2018   Procedure: INSERTION PORT-A-CATH;  Surgeon: Stark Klein, MD;  Location: [place];  Service: General;  Laterality: Left;  . RADIOACTIVE SEED GUIDED EXCISIONAL BREAST BIOPSY Right 09/01/2018   Procedure: RADIOACTIVE SEED GUIDED EXCISIONAL RIGHT  BREAST BIOPSY;  Surgeon: Stark Klein, MD;  Location: [place];  Service: General;  Laterality: Right;    There were no vitals filed for this visit.  Subjective Assessment - 01/12/19 1410    Subjective  I feel good I just try to be careful.  I did my exercises but not like I should.  i think the compression is on order.    Pertinent History  Pt presents post chemotherapy doxorubicin and cyclosphosphamide, paclitaxel and carboplatin completed 07/27/18.  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  Radiation completed to the left breast on 11/02/18.  On anastrozole.  Other include HTN, cholesterol.    Patient Stated Goals  get ready for work    Currently in Pain?  No/denies         [facility] PT Assessment - 01/12/19 0001      AROM   Right Shoulder Flexion  142 Degrees    Left Shoulder Flexion  150 Degrees    Left Shoulder ABduction  167 Degrees    Left Shoulder Horizontal ABduction  23 Degrees      Strength   Right Shoulder Flexion  5/5    Right Shoulder ABduction  5/5    Right Shoulder Internal Rotation  5/5    Right Shoulder External Rotation  5/5  Left Shoulder Flexion  5/5    Left Shoulder ABduction  5/5    Left Shoulder Internal Rotation  5/5      Standardized Balance Assessment   Standardized Balance Assessment  Berg Balance Test      Berg Balance Test   Sit to Stand  Able to stand without using hands and stabilize independently    Standing Unsupported  Able to stand safely 2 minutes    Sitting with Back Unsupported but Feet Supported on Floor or Stool  Able to sit safely and securely 2 minutes    Stand to Sit  Sits safely with minimal use of hands    Transfers  Able to transfer safely, minor use of hands    Standing Unsupported with Eyes Closed  Able to stand 10 seconds safely    Standing Unsupported with Feet Together  Able to place feet together independently and stand 1 minute safely     From Standing, Reach Forward with Outstretched Arm  Can reach confidently >25 cm (10")    From Standing Position, Pick up Object from Floor  Able to pick up shoe safely and easily    From Standing Position, Turn to Look Behind Over each Shoulder  Looks behind from both sides and weight shifts well    Turn 360 Degrees  Able to turn 360 degrees safely in 4 seconds or less    Standing Unsupported, Alternately Place Feet on Step/Stool  Able to stand independently and safely and complete 8 steps in 20 seconds    Standing Unsupported, One Foot in Front  Able to place foot tandem independently and hold 30 seconds    Standing on One Leg  Able to lift leg independently and hold > 10 seconds    Total Score  56                   [facility] Adult PT Treatment/Exercise - 01/12/19 0001      Self-Care   Self-Care  Other Self-Care Comments    Other Self-Care Comments   Pt arrived for last visit check in except for garment fit check if needed.  Pt was educated on garment donning using PT sleeve, garment care and use of garment during work.  Pt with no questions on home exercises and would like to keep performing the same ones at home.                    PT Long Term Goals - 01/12/19 1415      PT LONG TERM GOAL #1   Title  Pt will be screened for balance limitations with score of >45/56 on the BERG    Baseline  scored 56/56    Time  6    Period  Weeks    Status  Achieved      PT LONG TERM GOAL #2   Title  Pt will demonstrate readiness for work by performing all strengthening activities in PT without limitations or pain    Status  Achieved      PT LONG TERM GOAL #3   Title  Pt will be educated on lymphedema risk reduction and use of compression garments for work    Baseline  garments ordered    Status  Partially Met      PT LONG TERM GOAL #4   Title  Pt will obtain appropriate bras post surgery    Baseline  Has the prescription but has decided not to use them yet.  Status   Partially Met       Addend #3 met and #4 deferred     Plan - 01/12/19 1442    Clinical Impression Statement  Pt returns 3 weeks after getting HEP for improving work lifting tolerance.  Pt reports she feels 100% but likes to just take it easy at work if she can.  She is now ind with her HEP, has garmnets on the way from [facility], and demonstrates ROM of the right shoulder now equal to the left.  Pt also scored a perfect 56/56 on the BERG.  Pt will most likely not return but will keep her chart open until 02/02/19 to see if she calls for garment check.       Patient will benefit from skilled therapeutic intervention in order to improve the following deficits and impairments:     Visit Diagnosis: Muscle weakness (generalized)  Abnormal posture  Stiffness of left shoulder, not elsewhere classified  Malignant neoplasm of lower-outer quadrant of left breast of female, estrogen receptor positive ([place])  Malignant neoplasm of upper-outer quadrant of left breast in female, estrogen receptor positive ([place])     Problem List Patient Active Problem List   Diagnosis Date Noted  . Genetic testing 03/24/2018  . Port-A-Cath in place 03/17/2018  . Family history of breast cancer   . Family history of throat cancer   . Family history of lung cancer   . Malignant neoplasm of lower-outer quadrant of left breast of female, estrogen receptor negative ([place]) 02/26/2018  . Malignant neoplasm of upper-outer quadrant of left breast in female, estrogen receptor positive ([place]) 02/26/2018  . Essential hypertension 10/23/2014  . Hyperlipidemia 10/23/2014    [name] 01/12/2019, 2:46 PM  [place], [place], [address] Phone: [phone]   Fax:  [phone]  Name: Emily Chambers MRN: [id number] Date of Birth: 02/12/1951  PHYSICAL THERAPY DISCHARGE SUMMARY  Visits from Start of Care: 3  Current functional level  related to goals / functional outcomes: See above   Remaining deficits: Chronic lymphedema   Education / Equipment: HEP and garments Plan: Patient agrees to discharge.  Patient goals were met. Patient is being discharged due to meeting the stated rehab goals.  ?????     Shan Levans, PT

## 2019-02-16 ENCOUNTER — Telehealth: Payer: Self-pay | Admitting: Adult Health

## 2019-02-16 NOTE — Telephone Encounter (Signed)
I talk with patient regarding video visit °

## 2019-02-24 ENCOUNTER — Other Ambulatory Visit: Payer: Self-pay | Admitting: *Deleted

## 2019-02-24 DIAGNOSIS — Z171 Estrogen receptor negative status [ER-]: Secondary | ICD-10-CM

## 2019-02-24 DIAGNOSIS — C50512 Malignant neoplasm of lower-outer quadrant of left female breast: Secondary | ICD-10-CM

## 2019-02-24 NOTE — Progress Notes (Signed)
No note required.

## 2019-02-27 NOTE — Progress Notes (Signed)
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   She started on anastrozole at her last visit on 12/06/2018.  She is tolerating this well.  She has had minimal to no hot flashes, certainly no sweating.  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  There has not been any change in vaginal dryness issues.  Since her last visit here, she underwent coronavirus testing on 01/03/2019, which was negative.  She also underwent port removal on 01/06/2019 under Dr. Barry Dienes.  She also had bilateral cataract surgery.   REVIEW OF SYSTEMS: Emily Chambers is back at work in an [employer], working in [employer].  She tells me the school provides free lunches, for any student in [place].  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  She is keeping appropriate pandemic precautions, wearing her mask and distancing.  Otherwise a detailed review of systems today was stable.   HISTORY OF CURRENT ILLNESS: From the original intake note:  Emily Chambers had routine screening mammography on 02/10/2018 showing a possible abnormality in the left breast. She underwent bilateral diagnostic mammography with  tomography and left breast ultrasonography at [facility] on 02/16/2018 showing: breast density category C; two left breast masses, one at 2 o'clock and the other at 3:30 o'clock. The 2 o'clock mass (1.3 x 1 x 1 cm) corresponds to the mammographic abnormality and is highly suspicious for breast carcinoma. The other mass (1.2 x 0.7 x 1.2 cm)  is suspicious for breast carcinoma. No left axillary adenopathy.   Accordingly on 02/19/2018 she proceeded to biopsy of the left breast area in question. The pathology from this procedure ([id number]) showed:  1) 3:30 o'clock specimen showed invasive mammary carcinoma, possibly lobular (weak and atypical e-cadherin expression), grade 2. Prognostic indicators significant for: estrogen receptor, 90% positive and progesterone receptor, 95% positive, both with strong staining intensity. Proliferation marker Ki67 at 5%. HER2 negative (1+).   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 Prognostic indicators significant for: estrogen receptor, 10% positive with moderate staining intensity and progesterone receptor, 0% negative. Proliferation marker Ki67 at 40%. HER2 negative (1+).   The patient's subsequent history is as detailed below.   PAST MEDICAL HISTORY: Past Medical History:  Diagnosis Date  . Cancer [facility])    left breast cancer  . Family history of breast cancer   . Family history of lung cancer   . Family history of throat cancer   . High cholesterol   . Hypertension   . Kidney stone   . Kidney stones   . UTI (lower urinary tract infection)     PAST SURGICAL HISTORY: Past Surgical History:  Procedure Laterality Date  . BREAST EXCISIONAL BIOPSY Left 1999,2005   cysts removed  . BREAST LUMPECTOMY WITH RADIOACTIVE SEED  AND SENTINEL LYMPH NODE BIOPSY Left 09/01/2018   Procedure: LEFT BREAST  RADIOACTIVE SEED LUMPECTOMY X2 AND LEFT SENTINEL LYMPH NODE BIOPSY AND MAPPING;  Surgeon: Stark Klein, MD;  Location: [place];  Service: General;  Laterality: Left;  . BREAST SURGERY Left    cyst and biopsy  . PORT-A-CATH REMOVAL Left 01/06/2019   Procedure: REMOVAL PORT-A-CATH;  Surgeon: Stark Klein, MD;  Location: [place];  Service: General;  Laterality: Left;  . PORTACATH PLACEMENT Left 03/10/2018   Procedure: INSERTION PORT-A-CATH;  Surgeon: Stark Klein, MD;  Location: [place];  Service: General;  Laterality: Left;  . RADIOACTIVE SEED GUIDED EXCISIONAL BREAST BIOPSY Right 09/01/2018   Procedure: RADIOACTIVE SEED GUIDED EXCISIONAL RIGHT BREAST BIOPSY;  Surgeon: Stark Klein, MD;  Location: [place];  Service: General;  Laterality: Right;    FAMILY HISTORY: Family History  Problem Relation Age of Onset  . Hypertension Mother   . Heart disease Mother   . Dementia Mother   . Healthy Father   . Colon cancer Maternal Grandmother   . Cancer Paternal Grandmother        unk type  . Lung cancer Cousin        61s  . Breast cancer Cousin        51s  . Breast cancer Cousin        75s  . Breast cancer Cousin   . Cancer Paternal Aunt        unk type d. 52, possibly pancreatic  . Cancer Paternal Aunt        unk type d. 77s  . Cancer Paternal Uncle        unk type   . Cancer Paternal Uncle        unk type d. 10s  . Cancer Maternal Aunt        unk type, d. 18s  . Throat cancer Maternal Uncle        d. 52s  . Ovarian cancer Neg Hx    Patient father is alive at 46 years old. Patient mother died from heart disease and dementia at age 80.  The patient denies a family hx of ovarian cancer. She has 3 siblings, 1 brother and 2 sisters. She has a maternal cousin diagnosed with lung cancer in her 75s. She has 3 paternal cousins with breast cancer, one was diagnosed in her 61s and has passed away.   GYNECOLOGIC HISTORY:  No LMP recorded. Patient is postmenopausal. Menarche: 69 years old Age at first live birth: n/a GX P 0 LMP 2010 Contraceptive  n/a HRT no  Hysterectomy? no BSo? no   SOCIAL HISTORY: She is single and works as a Scientist, water quality at [employer]). She lives alone, with no pets.    ADVANCED DIRECTIVES: Not in place.  At the 03/03/2018 visit she was given the appropriate documents to complete and notarize at her discretion.  She is planning to name her niece, [name], as her HCPOA.   HEALTH MAINTENANCE: Social History   Tobacco Use  . Smoking status: Never Smoker  . Smokeless tobacco: Never Used  Substance Use Topics  . Alcohol use: Yes    Comment: seldom  . Drug use: No     Colonoscopy: 2014? [facility]  PAP: 02/02/2018  Bone density: 03/15/2016, T-score 0.4, Dr. Alden Hipp   No Known Allergies  Current Outpatient Medications  Medication Sig Dispense Refill  . amLODipine (NORVASC) 10 MG  tablet Take 10 mg by mouth every morning.     Marland Kitchen anastrozole (ARIMIDEX) 1 MG tablet Take 1 tablet (1 mg total) by mouth daily. 90 tablet 4  . aspirin 81 MG chewable tablet Chew 81 mg by mouth daily.    Marland Kitchen losartan (COZAAR) 100 MG tablet Take 100 mg by mouth every morning.     . pravastatin (PRAVACHOL) 20 MG tablet Take 20 mg by mouth every morning.      No current facility-administered medications for this visit.    OBJECTIVE: Middle-aged African-American woman who appears stated age  69:   02/28/19 0932  BP: (!) 144/82  Pulse: 68  Resp: 18  Temp: 98 F (36.7 C)  SpO2: 99%   Wt Readings from Last 3 Encounters:  02/28/19 178 lb (80.7 kg)  01/06/19 179 lb 10.8 oz (81.5 kg)  12/06/18 180 lb 14.4 oz (82.1 kg)   Body mass index is 32.56 kg/m.    ECOG FS:1 - Symptomatic but completely ambulatory  Sclerae unicteric, EOMs intact Wearing a mask No cervical or supraclavicular adenopathy Lungs no rales or rhonchi Heart regular rate and rhythm Abd soft, nontender, positive bowel sounds MSK no focal spinal tenderness, no upper extremity lymphedema Neuro: nonfocal, well  oriented, appropriate affect Breasts: The right breast is benign.  The left breast is status post lumpectomy followed by radiation.  There is no evidence of local recurrence.  Both axillae are benign.   LAB RESULTS:  CMP     Component Value Date/Time   NA 142 12/06/2018 1019   K 3.5 12/06/2018 1019   CL 108 12/06/2018 1019   CO2 25 12/06/2018 1019   GLUCOSE 114 (H) 12/06/2018 1019   BUN 11 12/06/2018 1019   CREATININE 0.75 12/06/2018 1019   CALCIUM 9.7 12/06/2018 1019   PROT 7.7 12/06/2018 1019   ALBUMIN 3.8 12/06/2018 1019   AST 18 12/06/2018 1019   ALT 21 12/06/2018 1019   ALKPHOS 95 12/06/2018 1019   BILITOT 0.3 12/06/2018 1019   GFRNONAA >60 12/06/2018 1019   GFRAA >60 12/06/2018 1019    No results found for: TOTALPROTELP, ALBUMINELP, A1GS, A2GS, BETS, BETA2SER, GAMS, MSPIKE, SPEI  No results found for: KPAFRELGTCHN, LAMBDASER, KAPLAMBRATIO  Lab Results  Component Value Date   WBC 6.6 02/28/2019   NEUTROABS 4.4 02/28/2019   HGB 11.6 (L) 02/28/2019   HCT 38.1 02/28/2019   MCV 90.5 02/28/2019   PLT 179 02/28/2019   No results found for: LABCA2  No components found for: [id number]  No results for input(s): INR in the last 168 hours.  No results found for: LABCA2  No results found for: RCV893  No results found for: YBO175  No results found for: ZWC585  No results found for: CA2729  No components found for: HGQUANT  No results found for: CEA1 / No results found for: CEA1   No results found for: AFPTUMOR  No results found for: [facility]  Appointment on 02/28/2019  Component Date Value Ref Range Status  . WBC Count 02/28/2019 6.6  4.0 - 10.5 K/uL Final  . RBC 02/28/2019 4.21  3.87 - 5.11 MIL/uL Final  . Hemoglobin 02/28/2019 11.6* 12.0 - 15.0 g/dL Final  . HCT 02/28/2019 38.1  36.0 - 46.0 % Final  . MCV 02/28/2019 90.5  80.0 - 100.0 fL Final  . MCH 02/28/2019 27.6  26.0 - 34.0 pg Final  . MCHC 02/28/2019 30.4  30.0 - 36.0 g/dL Final  . RDW  02/28/2019 13.6  11.5 -  15.5 % Final  . Platelet Count 02/28/2019 179  150 - 400 K/uL Final  . nRBC 02/28/2019 0.0  0.0 - 0.2 % Final  . Neutrophils Relative % 02/28/2019 67  % Final  . Neutro Abs 02/28/2019 4.4  1.7 - 7.7 K/uL Final  . Lymphocytes Relative 02/28/2019 22  % Final  . Lymphs Abs 02/28/2019 1.5  0.7 - 4.0 K/uL Final  . Monocytes Relative 02/28/2019 7  % Final  . Monocytes Absolute 02/28/2019 0.5  0.1 - 1.0 K/uL Final  . Eosinophils Relative 02/28/2019 3  % Final  . Eosinophils Absolute 02/28/2019 0.2  0.0 - 0.5 K/uL Final  . Basophils Relative 02/28/2019 1  % Final  . Basophils Absolute 02/28/2019 0.1  0.0 - 0.1 K/uL Final  . Immature Granulocytes 02/28/2019 0  % Final  . Abs Immature Granulocytes 02/28/2019 0.02  0.00 - 0.07 K/uL Final   Performed at [facility], [place] [address], [place], [place] [address]    No results found for: HGBA, HGBA2QUANT, HGBFQUANT, HGBSQUAN (Hemoglobinopathy evaluation)   No results found for: LDH  No results found for: IRON, TIBC, IRONPCTSAT (Iron and TIBC)  No results found for: FERRITIN  Urinalysis    Component Value Date/Time   COLORURINE YELLOW 04/12/2018 [place] 04/12/2018 1504   LABSPEC 1.020 04/12/2018 1504   PHURINE 5.0 04/12/2018 1504   GLUCOSEU NEGATIVE 04/12/2018 1504   HGBUR NEGATIVE 04/12/2018 1504   BILIRUBINUR NEGATIVE 04/12/2018 1504   KETONESUR NEGATIVE 04/12/2018 1504   PROTEINUR NEGATIVE 04/12/2018 1504   UROBILINOGEN 0.2 07/07/2012 1159   NITRITE NEGATIVE 04/12/2018 1504   LEUKOCYTESUR TRACE (A) 04/12/2018 1504     STUDIES: No results found.   ELIGIBLE FOR AVAILABLE RESEARCH PROTOCOL: Upbeat   ASSESSMENT: 69 y.o. [place] woman status post left breast biopsy x2 on 02/19/2018, showing  (a) in the upper outer quadrant, a clinical T1c N0, stage IA invasive carcinoma, likely lobular, grade 2, estrogen and progesterone receptor positive, HER-2 not amplified,  with an MIB-1 of 5%  (b) in the lower outer quadrant a clinical T1c N0, stage IA-B invasive ductal carcinoma, grade 2, estrogen receptor only moderately positive at 10%, progesterone receptor negative, with an MIB-1 of 40%, and HER-2 not amplified  (1) neoadjuvant chemotherapy will consist of doxorubicin and cyclophosphamide in dose dense fashion x4 starting 03/16/2018, 08/14/2018 04/27/2018, followed by paclitaxel and carboplatin weekly x12 starting 05/11/2018, completed 07/27/2018  (a) echo on 03/11/2018 shows well preserved EF of 60-65%  (2) status post left lumpectomy and sentinel lymph node sampling 09/01/2018 showing  (a) invasive ductal carcinoma, grade 2, ypT1b ypN0, triple negative  (b) invasive lobular carcinoma, grade 2, ypT1c ypN0, estrogen and progesterone receptor positive, HER-2 not amplified  (3) adjuvant radiation 10/04/2018 through 11/02/2018 Site Technique Total Dose Dose per Fx Completed Fx Beam Energies  Breast: Breast_Lt 3D 40.05/40.05 2.67 15/15 6X, 10X  Breast: Breast_Lt_Bst 3D 12/12 2 6/6 6X, 10X    (4) anastrozole (for upper outer quadrant tumor) started 12/06/2018  (a) bone density 03/25/2016 at the breast center showed a T score of 0.4 (normal).  (5) genetics testing on 03/24/2018 showed no pathogenic mutations.  Genes tested include:  APC, ATM, AXIN2, BARD1, BMPR1A, BRCA1, BRCA2, BRIP1, CDH1, CDKN2A (p14ARF), CDKN2A (p16INK4a), CKD4, CHEK2, CTNNA1, DICER1, EPCAM (Deletion/duplication testing only), GREM1 (promoter region deletion/duplication testing only), KIT, MEN1, MLH1, MSH2, MSH3, MSH6, MUTYH, NBN, NF1, NHTL1, PALB2, PDGFRA, PMS2, POLD1, POLE, PTEN, RAD50, RAD51C, RAD51D, SDHB, SDHC, SDHD,  SMAD4, SMARCA4. STK11, TP53, TSC1, TSC2, and VHL.  The following genes were evaluated for sequence changes only: SDHA and HOXB13 c.251G>A variant only.   PLAN: Emily Chambers is now about half a year out from definitive surgery for her breast cancer.  There is no evidence of disease  activity.  She is tolerating anastrozole remarkably well and the plan is to continue that a minimum of 5 years.  She is currently obtaining it at no cost.  I reassured her that the sensitivity and shooting pains that she is having sometimes in her left breast are normal and are due to her surgery and radiation and not to breast cancer.  She wanted to know if she should proceed with colonoscopy.  Certainly whenever she and Dr. Paulita Fujita are comfortable given the current pandemic she should go ahead and get that done.  She will have her next set of mammograms in July--I put in the order--and I will see her approximately a week after that.  Assuming all goes well at that point I will start seeing her on a once a year basis thereafter.  She knows to call for any other issue that may develop before the next visit.  Emily Chambers, Virgie Dad, MD  02/28/19 10:01 AM Medical Oncology and Hematology [facility] [place], [place] [address] Tel. [phone]    Fax. [phone]   I, Wilburn Mylar, am acting as scribe for Dr. Virgie Dad. Gari Trovato.  I, Lurline Del MD, have reviewed the above documentation for accuracy and completeness, and I agree with the above.

## 2019-02-28 ENCOUNTER — Inpatient Hospital Stay: Payer: Medicare Other | Attending: Oncology | Admitting: Oncology

## 2019-02-28 ENCOUNTER — Telehealth: Payer: Self-pay | Admitting: Oncology

## 2019-02-28 ENCOUNTER — Other Ambulatory Visit: Payer: Self-pay

## 2019-02-28 ENCOUNTER — Inpatient Hospital Stay: Payer: Medicare Other

## 2019-02-28 VITALS — BP 144/82 | HR 68 | Temp 98.0°F | Resp 18 | Ht 62.0 in | Wt 178.0 lb

## 2019-02-28 DIAGNOSIS — Z170 Estrogen receptor positive status [ER+]: Secondary | ICD-10-CM

## 2019-02-28 DIAGNOSIS — Z79811 Long term (current) use of aromatase inhibitors: Secondary | ICD-10-CM | POA: Diagnosis not present

## 2019-02-28 DIAGNOSIS — C50512 Malignant neoplasm of lower-outer quadrant of left female breast: Secondary | ICD-10-CM

## 2019-02-28 DIAGNOSIS — Z90722 Acquired absence of ovaries, bilateral: Secondary | ICD-10-CM | POA: Insufficient documentation

## 2019-02-28 DIAGNOSIS — Z171 Estrogen receptor negative status [ER-]: Secondary | ICD-10-CM | POA: Diagnosis not present

## 2019-02-28 DIAGNOSIS — C50412 Malignant neoplasm of upper-outer quadrant of left female breast: Secondary | ICD-10-CM | POA: Diagnosis present

## 2019-02-28 LAB — CMP (CANCER CENTER ONLY)
ALT: 23 U/L (ref 0–44)
AST: 15 U/L (ref 15–41)
Albumin: 4.1 g/dL (ref 3.5–5.0)
Alkaline Phosphatase: 96 U/L (ref 38–126)
Anion gap: 11 (ref 5–15)
BUN: 13 mg/dL (ref 8–23)
CO2: 27 mmol/L (ref 22–32)
Calcium: 9.9 mg/dL (ref 8.9–10.3)
Chloride: 108 mmol/L (ref 98–111)
Creatinine: 0.79 mg/dL (ref 0.44–1.00)
GFR, Est AFR Am: >60 mL/min (ref >60)
GFR, Estimated: >60 mL/min (ref >60)
Glucose, Bld: 97 mg/dL (ref 70–99)
Potassium: 3.4 mmol/L — ABNORMAL LOW (ref 3.5–5.1)
Sodium: 146 mmol/L — ABNORMAL HIGH (ref 135–145)
Total Bilirubin: 0.4 mg/dL (ref 0.3–1.2)
Total Protein: 7.7 g/dL (ref 6.5–8.1)

## 2019-02-28 LAB — CBC WITH DIFFERENTIAL (CANCER CENTER ONLY)
Abs Immature Granulocytes: 0.02 10*3/uL (ref 0.00–0.07)
Basophils Absolute: 0.1 10*3/uL (ref 0.0–0.1)
Basophils Relative: 1 %
Eosinophils Absolute: 0.2 10*3/uL (ref 0.0–0.5)
Eosinophils Relative: 3 %
HCT: 38.1 % (ref 36.0–46.0)
Hemoglobin: 11.6 g/dL — ABNORMAL LOW (ref 12.0–15.0)
Immature Granulocytes: 0 %
Lymphocytes Relative: 22 %
Lymphs Abs: 1.5 10*3/uL (ref 0.7–4.0)
MCH: 27.6 pg (ref 26.0–34.0)
MCHC: 30.4 g/dL (ref 30.0–36.0)
MCV: 90.5 fL (ref 80.0–100.0)
Monocytes Absolute: 0.5 10*3/uL (ref 0.1–1.0)
Monocytes Relative: 7 %
Neutro Abs: 4.4 10*3/uL (ref 1.7–7.7)
Neutrophils Relative %: 67 %
Platelet Count: 179 10*3/uL (ref 150–400)
RBC: 4.21 MIL/uL (ref 3.87–5.11)
RDW: 13.6 % (ref 11.5–15.5)
WBC Count: 6.6 10*3/uL (ref 4.0–10.5)
nRBC: 0 % (ref 0.0–0.2)

## 2019-02-28 NOTE — Telephone Encounter (Signed)
I talk with patient regarding schedule  

## 2019-03-21 ENCOUNTER — Encounter: Payer: Medicare Other | Admitting: Adult Health

## 2019-04-18 ENCOUNTER — Telehealth: Payer: Self-pay

## 2019-04-18 NOTE — Telephone Encounter (Signed)
RN returned call, voicemail left for return call.  

## 2019-04-19 ENCOUNTER — Other Ambulatory Visit: Payer: Self-pay

## 2019-04-19 ENCOUNTER — Telehealth: Payer: Self-pay | Admitting: *Deleted

## 2019-04-19 ENCOUNTER — Inpatient Hospital Stay: Payer: Medicare Other | Attending: Oncology | Admitting: Medical

## 2019-04-19 ENCOUNTER — Telehealth: Payer: Self-pay | Admitting: Emergency Medicine

## 2019-04-19 VITALS — BP 128/75 | HR 86 | Temp 98.2°F | Resp 18 | Ht 62.0 in | Wt 177.6 lb

## 2019-04-19 DIAGNOSIS — Z923 Personal history of irradiation: Secondary | ICD-10-CM | POA: Diagnosis not present

## 2019-04-19 DIAGNOSIS — Z79899 Other long term (current) drug therapy: Secondary | ICD-10-CM | POA: Insufficient documentation

## 2019-04-19 DIAGNOSIS — Z170 Estrogen receptor positive status [ER+]: Secondary | ICD-10-CM

## 2019-04-19 DIAGNOSIS — Z9221 Personal history of antineoplastic chemotherapy: Secondary | ICD-10-CM | POA: Diagnosis not present

## 2019-04-19 DIAGNOSIS — L03313 Cellulitis of chest wall: Secondary | ICD-10-CM

## 2019-04-19 DIAGNOSIS — Z171 Estrogen receptor negative status [ER-]: Secondary | ICD-10-CM | POA: Diagnosis not present

## 2019-04-19 DIAGNOSIS — C50412 Malignant neoplasm of upper-outer quadrant of left female breast: Secondary | ICD-10-CM | POA: Diagnosis not present

## 2019-04-19 DIAGNOSIS — C50512 Malignant neoplasm of lower-outer quadrant of left female breast: Secondary | ICD-10-CM | POA: Insufficient documentation

## 2019-04-19 MED ORDER — SULFAMETHOXAZOLE-TRIMETHOPRIM 800-160 MG PO TABS: 1.0000 | ORAL_TABLET | Freq: Two times a day (BID) | ORAL | 0 refills | Status: DC

## 2019-04-19 MED ORDER — SODIUM CHLORIDE 0.9 % IV SOLN
Freq: Once | INTRAVENOUS | Status: AC
  Filled 2019-04-19: qty 250

## 2019-04-19 MED ORDER — DEXTROSE 5 % IV SOLN
2.0000 g | Freq: Once | INTRAVENOUS | Status: AC
  Administered 2019-04-19: 15:00:00 2 g via INTRAVENOUS
  Filled 2019-04-19: qty 20

## 2019-04-19 NOTE — Progress Notes (Signed)
Pt received IV Rocephin, tolerated well.

## 2019-04-19 NOTE — Telephone Encounter (Signed)
This RN received VM from pt as well as communication from [place]- regarding " aching pain with swelling in left breast x 1 week with worsening symptoms"  This RN returned call to pt and obtained identified VM- message left informing pt of need to be seen for possible cellulitis- and requested a return call- she is to inform the operator that she was requested and needs to speak directly to a nurse for visit asap.  This RN's name given with number to the office.

## 2019-04-19 NOTE — Patient Instructions (Signed)

## 2019-04-19 NOTE — Telephone Encounter (Signed)
Reached out to pt regarding reports of breast pain.  Pt states she is at work until 1 pm but is willing to come to [place] afterwards to be seen in [facility]  High priority scheduling message sent.  RN Val for MD Magrinat's office aware.

## 2019-04-19 NOTE — Progress Notes (Signed)
[facility] Progress Note   Emily Chambers [id number] 26-Nov-1950 69 y.o.  Emily Chambers is managed by Dr. Lurline Del  Actively treated with chemotherapy/immunotherapy/hormonal therapy: yes  Current therapy: Anastrozole  Next scheduled appointment with provider: 09/12/2019  Assessment: Plan:    Cellulitis of chest wall - Plan: cefTRIAXone (ROCEPHIN) 2 g in dextrose 5 % 50 mL IVPB, 0.9 %  sodium chloride infusion, sulfamethoxazole-trimethoprim (BACTRIM DS) 800-160 MG tablet  Malignant neoplasm of upper-outer quadrant of left breast in female, estrogen receptor positive ([place])   Cellulitis of the left breast: The patient was given Rocephin 2 grams IV x 1. Additionally, she was given Bactrim DS p.o. BID x 10 days.  The patient was instructed to return after completion of her antibiotics if her symptoms do not improve.  She was also told to return should her condition worsen.  She expressed understanding and agreement with this plan.  ER positive malignant neoplasm of the left breast: The patient is managed by Dr. Jana Hakim and continues on anastrozole. She is scheduled to be seen next on 09/12/2019.   Please see After Visit Summary for patient specific instructions.  Future Appointments  Date Time Provider [place]  04/20/2019 10:30 AM Gardenia Phlegm, NP [facility] None  09/12/2019 10:30 AM [facility] LAB 1 [facility] None  09/12/2019 11:00 AM Magrinat, Virgie Dad, MD [facility] None    No orders of the defined types were placed in this encounter.     Subjective:   Patient ID:  Emily Chambers is a 69 y.o. (DOB 08-27-1950) female.  Chief Complaint:  Chief Complaint  Patient presents with  . Breast Pain    Left    HPI Emily Chambers  Is a 70 y.o. female with a diagnosis of an ER positive malignant neoplasm of the left breast who is followed by Dr. Jana Hakim.  She continues on anastrozole.  She presents to the clinic today with a 1  week history of tenderness, swelling, erythema, and induration of her left breast.  She denies drainage, fevers, chills, sweats, shortness of breath, or chest pain.  She denies any injuries.  She reports that she has returned to work and is lifting more than she had been.  She is scheduled to see Dr. Jana Hakim in follow-up on 09/12/2019.  Medications: I have reviewed the patient's current medications.  Allergies: No Known Allergies  Past Medical History:  Diagnosis Date  . Cancer [facility])    left breast cancer  . Family history of breast cancer   . Family history of lung cancer   . Family history of throat cancer   . High cholesterol   . Hypertension   . Kidney stone   . Kidney stones   . UTI (lower urinary tract infection)     Past Surgical History:  Procedure Laterality Date  . BREAST EXCISIONAL BIOPSY Left 1999,2005   cysts removed  . BREAST LUMPECTOMY WITH RADIOACTIVE SEED AND SENTINEL LYMPH NODE BIOPSY Left 09/01/2018   Procedure: LEFT BREAST  RADIOACTIVE SEED LUMPECTOMY X2 AND LEFT SENTINEL LYMPH NODE BIOPSY AND MAPPING;  Surgeon: Stark Klein, MD;  Location: [place];  Service: General;  Laterality: Left;  . BREAST SURGERY Left    cyst and biopsy  . PORT-A-CATH REMOVAL Left 01/06/2019   Procedure: REMOVAL PORT-A-CATH;  Surgeon: Stark Klein, MD;  Location: [place];  Service: General;  Laterality: Left;  . PORTACATH PLACEMENT Left 03/10/2018   Procedure: INSERTION PORT-A-CATH;  Surgeon:  Stark Klein, MD;  Location: [place];  Service: General;  Laterality: Left;  . RADIOACTIVE SEED GUIDED EXCISIONAL BREAST BIOPSY Right 09/01/2018   Procedure: RADIOACTIVE SEED GUIDED EXCISIONAL RIGHT BREAST BIOPSY;  Surgeon: Stark Klein, MD;  Location: [place];  Service: General;  Laterality: Right;    Family History  Problem Relation Age of Onset  . Hypertension Mother   . Heart disease Mother   . Dementia Mother   .  Healthy Father   . Colon cancer Maternal Grandmother   . Cancer Paternal Grandmother        unk type  . Lung cancer Cousin        73s  . Breast cancer Cousin        65s  . Breast cancer Cousin        35s  . Breast cancer Cousin   . Cancer Paternal Aunt        unk type d. 74, possibly pancreatic  . Cancer Paternal Aunt        unk type d. 35s  . Cancer Paternal Uncle        unk type   . Cancer Paternal Uncle        unk type d. 22s  . Cancer Maternal Aunt        unk type, d. 25s  . Throat cancer Maternal Uncle        d. 6s  . Ovarian cancer Neg Hx     Social History   Socioeconomic History  . Marital status: Single    Spouse name: Not on file  . Number of children: 0  . Years of education: 55  . Highest education level: Not on file  Occupational History  . Occupation: Laborer  Tobacco Use  . Smoking status: Never Smoker  . Smokeless tobacco: Never Used  Substance and Sexual Activity  . Alcohol use: Yes    Comment: seldom  . Drug use: No  . Sexual activity: Not on file  Other Topics Concern  . Not on file  Social History Narrative   Fun: park, walking, music   Denies religious beliefs effecting health care.    Feels safe at home and denies abuse. Lives alone.   Social Determinants of Health   Financial Resource Strain:   . Difficulty of Paying Living Expenses: Not on file  Food Insecurity:   . Worried About Charity fundraiser in the Last Year: Not on file  . Ran Out of Food in the Last Year: Not on file  Transportation Needs:   . Lack of Transportation (Medical): Not on file  . Lack of Transportation (Non-Medical): Not on file  Physical Activity:   . Days of Exercise per Week: Not on file  . Minutes of Exercise per Session: Not on file  Stress:   . Feeling of Stress : Not on file  Social Connections:   . Frequency of Communication with Friends and Family: Not on file  . Frequency of Social Gatherings with Friends and Family: Not on file  . Attends  Religious Services: Not on file  . Active Member of Clubs or Organizations: Not on file  . Attends Archivist Meetings: Not on file  . Marital Status: Not on file  Intimate Partner Violence:   . Fear of Current or Ex-Partner: Not on file  . Emotionally Abused: Not on file  . Physically Abused: Not on file  . Sexually Abused: Not on file  Past Medical History, Surgical history, Social history, and Family history were reviewed and updated as appropriate.   Please see review of systems for further details on the patient's review from today.   Review of Systems:  Review of Systems  Constitutional: Negative for chills, diaphoresis and fever.  HENT: Negative for facial swelling and trouble swallowing.   Respiratory: Negative for cough, chest tightness and shortness of breath.   Cardiovascular: Negative for chest pain.  Skin: Positive for color change. Negative for rash and wound.    Objective:   Physical Exam:  BP 128/75 (BP Location: Right Arm, Patient Position: Sitting)   Pulse 86   Temp 98.2 F (36.8 C) (Oral)   Resp 18   Ht 5\' 2"  (1.575 m)   Wt 177 lb 9.6 oz (80.6 kg)   SpO2 99%   BMI 32.48 kg/m  ECOG: 0  Physical Exam Constitutional:      General: She is not in acute distress.    Appearance: Normal appearance. She is not ill-appearing, toxic-appearing or diaphoretic.  HENT:     Head: Normocephalic and atraumatic.  Eyes:     General: No scleral icterus.       Right eye: No discharge.        Left eye: No discharge.     Conjunctiva/sclera: Conjunctivae normal.  Skin:    General: Skin is warm and dry.     Findings: Erythema present.     Comments: The left breast shows a well-healed surgical incision and the inferior left areola.  The left breast is indurated, tender with increased warmth.  There is a well-healed left lateral surgical incision.  No open wounds are appreciated.  No exudate is noted.  Neurological:     Mental Status: She is alert.      Coordination: Coordination normal.     Gait: Gait normal.  Psychiatric:        Mood and Affect: Mood normal.        Behavior: Behavior normal.        Thought Content: Thought content normal.        Judgment: Judgment normal.     Lab Review:     Component Value Date/Time   NA 146 (H) 02/28/2019 0914   K 3.4 (L) 02/28/2019 0914   CL 108 02/28/2019 0914   CO2 27 02/28/2019 0914   GLUCOSE 97 02/28/2019 0914   BUN 13 02/28/2019 0914   CREATININE 0.79 02/28/2019 0914   CALCIUM 9.9 02/28/2019 0914   PROT 7.7 02/28/2019 0914   ALBUMIN 4.1 02/28/2019 0914   AST 15 02/28/2019 0914   ALT 23 02/28/2019 0914   ALKPHOS 96 02/28/2019 0914   BILITOT 0.4 02/28/2019 0914   GFRNONAA >60 02/28/2019 0914   GFRAA >60 02/28/2019 0914       Component Value Date/Time   WBC 6.6 02/28/2019 0914   WBC 4.3 05/28/2017 0956   RBC 4.21 02/28/2019 0914   HGB 11.6 (L) 02/28/2019 0914   HCT 38.1 02/28/2019 0914   PLT 179 02/28/2019 0914   MCV 90.5 02/28/2019 0914   MCH 27.6 02/28/2019 0914   MCHC 30.4 02/28/2019 0914   RDW 13.6 02/28/2019 0914   LYMPHSABS 1.5 02/28/2019 0914   MONOABS 0.5 02/28/2019 0914   EOSABS 0.2 02/28/2019 0914   BASOSABS 0.1 02/28/2019 0914   -------------------------------  Imaging from last 24 hours (if applicable):  Radiology interpretation: No results found.

## 2019-04-20 ENCOUNTER — Inpatient Hospital Stay (HOSPITAL_BASED_OUTPATIENT_CLINIC_OR_DEPARTMENT_OTHER): Payer: Medicare Other | Admitting: Adult Health

## 2019-04-20 ENCOUNTER — Telehealth: Payer: Self-pay | Admitting: Adult Health

## 2019-04-20 ENCOUNTER — Telehealth: Payer: Self-pay

## 2019-04-20 ENCOUNTER — Other Ambulatory Visit: Payer: Self-pay | Admitting: Adult Health

## 2019-04-20 DIAGNOSIS — Z171 Estrogen receptor negative status [ER-]: Secondary | ICD-10-CM

## 2019-04-20 DIAGNOSIS — E2839 Other primary ovarian failure: Secondary | ICD-10-CM | POA: Diagnosis not present

## 2019-04-20 DIAGNOSIS — C50512 Malignant neoplasm of lower-outer quadrant of left female breast: Secondary | ICD-10-CM | POA: Diagnosis not present

## 2019-04-20 DIAGNOSIS — C50412 Malignant neoplasm of upper-outer quadrant of left female breast: Secondary | ICD-10-CM | POA: Diagnosis not present

## 2019-04-20 DIAGNOSIS — Z170 Estrogen receptor positive status [ER+]: Secondary | ICD-10-CM

## 2019-04-20 NOTE — Telephone Encounter (Signed)
Called and given appt for tomorrow at breast center for 1030, arrive at 1015 for ultrasound. She verbalized understanding.

## 2019-04-20 NOTE — Telephone Encounter (Signed)
I talk with patient regarding schedule  

## 2019-04-20 NOTE — Progress Notes (Signed)
SURVIVORSHIP VIRTUAL VISIT:  I connected with Rodnisha Blomgren on 04/20/19 at 10:30 AM EST by telephone and verified that I am speaking with the correct person using two identifiers.  I discussed the limitations, risks, security and privacy concerns of performing an evaluation and management service via telephone and the availability of in person appointments. I also discussed with the patient that there may be a patient responsible charge related to this service. The patient expressed understanding and agreed to proceed.   Patient location: At home Provider location: [facility] office Others participating in call: none  BRIEF ONCOLOGIC HISTORY:  Oncology History  Malignant neoplasm of lower-outer quadrant of left breast of female, estrogen receptor negative ([place])  02/26/2018 Initial Diagnosis   Malignant neoplasm of lower-outer quadrant of left breast of female, estrogen receptor negative ([place])   03/17/2018 - 07/27/2018 Chemotherapy   DOXOrubicin (ADRIAMYCIN) chemo injection 112 mg, 60 mg/m2 = 112 mg, Intravenous,  Once, 4 of 4 cycles. Administration: 112 mg (03/17/2018), 112 mg (03/30/2018), 112 mg (04/13/2018), 112 mg (04/27/2018)  palonosetron (ALOXI) injection 0.25 mg, 0.25 mg, Intravenous,  Once, 8 of 8 cycles. Administration: 0.25 mg (03/17/2018), 0.25 mg (05/11/2018), 0.25 mg (03/30/2018), 0.25 mg (04/13/2018), 0.25 mg (04/27/2018), 0.25 mg (06/01/2018), 0.25 mg (05/18/2018), 0.25 mg (05/25/2018), 0.25 mg (06/08/2018), 0.25 mg (06/15/2018), 0.25 mg (06/22/2018), 0.25 mg (06/29/2018), 0.25 mg (07/06/2018), 0.25 mg (07/13/2018), 0.25 mg (07/20/2018), 0.25 mg (07/27/2018)  pegfilgrastim-cbqv (UDENYCA) injection 6 mg, 6 mg, Subcutaneous, Once, 4 of 4 cycles Administration: 6 mg (03/19/2018), 6 mg (04/01/2018), 6 mg (04/15/2018), 6 mg (04/29/2018)  CARBOplatin (PARAPLATIN) 190 mg in sodium chloride 0.9 % 250 mL chemo infusion, 190 mg (100 % of original dose 187.2 mg), Intravenous,  Once, 4 of 4 cycles. Dose modification:   (original dose  187.2 mg, Cycle 5),   (original dose 187.2 mg, Cycle 5). Administration: 190 mg (05/11/2018), 190 mg (05/18/2018), 190 mg (05/25/2018), 190 mg (06/01/2018), 190 mg (06/08/2018), 190 mg (06/15/2018), 190 mg (06/22/2018), 190 mg (06/29/2018), 190 mg (07/06/2018), 190 mg (07/13/2018), 190 mg (07/20/2018)  cyclophosphamide (CYTOXAN) 1,120 mg in sodium chloride 0.9 % 250 mL chemo infusion, 600 mg/m2 = 1,120 mg, Intravenous,  Once, 4 of 4 cycles Administration: 1,120 mg (03/17/2018), 1,120 mg (03/30/2018), 1,120 mg (04/13/2018), 1,120 mg (04/27/2018)  gemcitabine (GEMZAR) 1,482 mg in sodium chloride 0.9 % 250 mL chemo infusion, 800 mg/m2 = 1,482 mg (100 % of original dose 800 mg/m2), Intravenous,  Once, 1 of 1 cycle Dose modification: 800 mg/m2 (original dose 800 mg/m2, Cycle 8, Reason: Provider Judgment) Administration: 1,482 mg (07/27/2018)  PACLitaxel (TAXOL) 150 mg in sodium chloride 0.9 % 250 mL chemo infusion (</= 68m/m2), 80 mg/m2 = 150 mg, Intravenous,  Once, 4 of 4 cycles Administration: 150 mg (05/11/2018), 150 mg (05/18/2018), 150 mg (05/25/2018), 150 mg (06/01/2018), 150 mg (06/08/2018), 150 mg (06/15/2018), 150 mg (06/22/2018), 150 mg (06/29/2018), 150 mg (07/06/2018), 150 mg (07/13/2018), 150 mg (07/20/2018)  fosaprepitant (EMEND) 150 mg   dexamethasone (DECADRON) 12 mg in sodium chloride 0.9 % 145 mL IVPB, , Intravenous,  Once, 4 of 4 cycles Administration:  (03/17/2018),  (03/30/2018),  (04/13/2018),  (04/27/2018)    Malignant neoplasm of upper-outer quadrant of left breast in female, estrogen receptor positive ([place]  02/26/2018 Initial Diagnosis   Malignant neoplasm of upper-outer quadrant of left breast in female, estrogen receptor positive ([place]   03/17/2018 - 07/27/2018 Chemotherapy   DOXOrubicin (ADRIAMYCIN) chemo injection 112 mg, 60 mg/m2 = 112 mg, Intravenous,  Once, 4 of  4 cycles. Administration: 112 mg (03/17/2018), 112 mg (03/30/2018), 112 mg (04/13/2018), 112 mg (04/27/2018)  palonosetron (ALOXI) injection 0.25 mg, 0.25  mg, Intravenous,  Once, 8 of 8 cycles. Administration: 0.25 mg (03/17/2018), 0.25 mg (05/11/2018), 0.25 mg (03/30/2018), 0.25 mg (04/13/2018), 0.25 mg (04/27/2018), 0.25 mg (06/01/2018), 0.25 mg (05/18/2018), 0.25 mg (05/25/2018), 0.25 mg (06/08/2018), 0.25 mg (06/15/2018), 0.25 mg (06/22/2018), 0.25 mg (06/29/2018), 0.25 mg (07/06/2018), 0.25 mg (07/13/2018), 0.25 mg (07/20/2018), 0.25 mg (07/27/2018)  pegfilgrastim-cbqv (UDENYCA) injection 6 mg, 6 mg, Subcutaneous, Once, 4 of 4 cycles Administration: 6 mg (03/19/2018), 6 mg (04/01/2018), 6 mg (04/15/2018), 6 mg (04/29/2018)  CARBOplatin (PARAPLATIN) 190 mg in sodium chloride 0.9 % 250 mL chemo infusion, 190 mg (100 % of original dose 187.2 mg), Intravenous,  Once, 4 of 4 cycles. Dose modification:   (original dose 187.2 mg, Cycle 5),   (original dose 187.2 mg, Cycle 5). Administration: 190 mg (05/11/2018), 190 mg (05/18/2018), 190 mg (05/25/2018), 190 mg (06/01/2018), 190 mg (06/08/2018), 190 mg (06/15/2018), 190 mg (06/22/2018), 190 mg (06/29/2018), 190 mg (07/06/2018), 190 mg (07/13/2018), 190 mg (07/20/2018)  cyclophosphamide (CYTOXAN) 1,120 mg in sodium chloride 0.9 % 250 mL chemo infusion, 600 mg/m2 = 1,120 mg, Intravenous,  Once, 4 of 4 cycles Administration: 1,120 mg (03/17/2018), 1,120 mg (03/30/2018), 1,120 mg (04/13/2018), 1,120 mg (04/27/2018)  gemcitabine (GEMZAR) 1,482 mg in sodium chloride 0.9 % 250 mL chemo infusion, 800 mg/m2 = 1,482 mg (100 % of original dose 800 mg/m2), Intravenous,  Once, 1 of 1 cycle Dose modification: 800 mg/m2 (original dose 800 mg/m2, Cycle 8, Reason: Provider Judgment) Administration: 1,482 mg (07/27/2018)  PACLitaxel (TAXOL) 150 mg in sodium chloride 0.9 % 250 mL chemo infusion (</= 19m/m2), 80 mg/m2 = 150 mg, Intravenous,  Once, 4 of 4 cycles Administration: 150 mg (05/11/2018), 150 mg (05/18/2018), 150 mg (05/25/2018), 150 mg (06/01/2018), 150 mg (06/08/2018), 150 mg (06/15/2018), 150 mg (06/22/2018), 150 mg (06/29/2018), 150 mg (07/06/2018), 150 mg (07/13/2018), 150  mg (07/20/2018)  fosaprepitant (EMEND) 150 mg   dexamethasone (DECADRON) 12 mg in sodium chloride 0.9 % 145 mL IVPB, , Intravenous,  Once, 4 of 4 cycles Administration:  (03/17/2018),  (03/30/2018),  (04/13/2018),  (04/27/2018)    03/24/2018 Genetic Testing   Genetic testing showed no pathogenic mutations.  Genes tested include:  APC, ATM, AXIN2, BARD1, BMPR1A, BRCA1, BRCA2, BRIP1, CDH1, CDKN2A (p14ARF), CDKN2A (p16INK4a), CKD4, CHEK2, CTNNA1, DICER1, EPCAM (Deletion/duplication testing only), GREM1 (promoter region deletion/duplication testing only), KIT, MEN1, MLH1, MSH2, MSH3, MSH6, MUTYH, NBN, NF1, NHTL1, PALB2, PDGFRA, PMS2, POLD1, POLE, PTEN, RAD50, RAD51C, RAD51D, SDHB, SDHC, SDHD, SMAD4, SMARCA4. STK11, TP53, TSC1, TSC2, and VHL.  The following genes were evaluated for sequence changes only: SDHA and HOXB13 c.251G>A variant only.   09/01/2018 Surgery   Right breast lumpectomy w/seed (Dorris Fetch ([phone] revealed no malignancy.  Left breast lumpectomy @ 2 o'clock revealed IDC, grade 2, spanning 0.9 cm and focal high grade DCIS. Negative margins. Triple negative.  Left breast lumpectomy @ 3:30 revealed invasive lobular carcinoma, grade 2, spanning 1.4 cm, <0.1 cm of the anterior margin multifocally. ER positive, PR positive, HER-2 negative.  0/2 left axillary lymph nodes were positive for carcinoma.   10/04/2018 - 11/02/2018 Radiation Therapy   The patient initially received a dose of 40.05 Gy in 15 fractions to the breast using whole-breast tangent fields. This was delivered using a 3-D conformal technique. The pt received a boost delivering an additional 12 Gy in 2 fractions using a electron boost with 111m  electrons. The total dose was 52.05 Gy.   11/2018 - 11/2023 Anti-estrogen oral therapy   Anastrozole     INTERVAL HISTORY:  Ms. Sapp to review her survivorship care plan detailing her treatment course for breast cancer, as well as monitoring long-term side effects of that treatment,  education regarding health maintenance, screening, and overall wellness and health promotion.     Overall, Ms. Kumagai reports feeling quite well.  She is taking Anastrozole daily.  She notes that she has an occasional hot flash.  Otherwise she is feeling well.   She saw Lucianne Lei yesterday for left breast cellulitis.  She was started on antibiotics, received IV Rocephin in the office.  She is taking Bactrim DS BID and is tolerating it well.  She denies any breast improvement.   REVIEW OF SYSTEMS:  Review of Systems  Constitutional: Negative for appetite change, chills, fatigue, fever and unexpected weight change.  HENT:   Negative for hearing loss, lump/mass and trouble swallowing.   Eyes: Negative for eye problems and icterus.  Respiratory: Negative for chest tightness, cough and shortness of breath.   Cardiovascular: Negative for chest pain, leg swelling and palpitations.  Gastrointestinal: Negative for abdominal distention, abdominal pain, constipation, diarrhea, nausea and vomiting.  Endocrine: Negative for hot flashes.  Genitourinary: Negative for difficulty urinating.   Musculoskeletal: Negative for arthralgias.  Skin: Negative for itching and rash.  Neurological: Negative for dizziness, extremity weakness, headaches and numbness.  Hematological: Negative for adenopathy. Does not bruise/bleed easily.  Psychiatric/Behavioral: Negative for depression. The patient is not nervous/anxious.   Breast: Denies any new nodularity, masses, tenderness, nipple changes, or nipple discharge.      ONCOLOGY TREATMENT TEAM:  1. Surgeon:  Dr. Barry Dienes at [facility] 2. Medical Oncologist: Dr. Jana Hakim  3. Radiation Oncologist: Dr. Sondra Come    PAST MEDICAL/SURGICAL HISTORY:  Past Medical History:  Diagnosis Date  . Cancer [facility])    left breast cancer  . Family history of breast cancer   . Family history of lung cancer   . Family history of throat cancer   . High cholesterol   .  Hypertension   . Kidney stone   . Kidney stones   . UTI (lower urinary tract infection)    Past Surgical History:  Procedure Laterality Date  . BREAST EXCISIONAL BIOPSY Left 1999,2005   cysts removed  . BREAST LUMPECTOMY WITH RADIOACTIVE SEED AND SENTINEL LYMPH NODE BIOPSY Left 09/01/2018   Procedure: LEFT BREAST  RADIOACTIVE SEED LUMPECTOMY X2 AND LEFT SENTINEL LYMPH NODE BIOPSY AND MAPPING;  Surgeon: Stark Klein, MD;  Location: [place];  Service: General;  Laterality: Left;  . BREAST SURGERY Left    cyst and biopsy  . PORT-A-CATH REMOVAL Left 01/06/2019   Procedure: REMOVAL PORT-A-CATH;  Surgeon: Stark Klein, MD;  Location: [place];  Service: General;  Laterality: Left;  . PORTACATH PLACEMENT Left 03/10/2018   Procedure: INSERTION PORT-A-CATH;  Surgeon: Stark Klein, MD;  Location: [place];  Service: General;  Laterality: Left;  . RADIOACTIVE SEED GUIDED EXCISIONAL BREAST BIOPSY Right 09/01/2018   Procedure: RADIOACTIVE SEED GUIDED EXCISIONAL RIGHT BREAST BIOPSY;  Surgeon: Stark Klein, MD;  Location: [place];  Service: General;  Laterality: Right;     ALLERGIES:  No Known Allergies   CURRENT MEDICATIONS:  Outpatient Encounter Medications as of 04/20/2019  Medication Sig  . amLODipine (NORVASC) 10 MG tablet Take 10 mg by mouth every morning.   Marland Kitchen anastrozole (ARIMIDEX)  1 MG tablet Take 1 tablet (1 mg total) by mouth daily.  Marland Kitchen aspirin 81 MG chewable tablet Chew 81 mg by mouth daily.  Marland Kitchen losartan (COZAAR) 100 MG tablet Take 100 mg by mouth every morning.   . pravastatin (PRAVACHOL) 20 MG tablet Take 20 mg by mouth every morning.   . sulfamethoxazole-trimethoprim (BACTRIM DS) 800-160 MG tablet Take 1 tablet by mouth 2 (two) times daily.   No facility-administered encounter medications on file as of 04/20/2019.     ONCOLOGIC FAMILY HISTORY:  Family History  Problem Relation Age of Onset  . Hypertension  Mother   . Heart disease Mother   . Dementia Mother   . Healthy Father   . Colon cancer Maternal Grandmother   . Cancer Paternal Grandmother        unk type  . Lung cancer Cousin        73s  . Breast cancer Cousin        79s  . Breast cancer Cousin        26s  . Breast cancer Cousin   . Cancer Paternal Aunt        unk type d. 24, possibly pancreatic  . Cancer Paternal Aunt        unk type d. 78s  . Cancer Paternal Uncle        unk type   . Cancer Paternal Uncle        unk type d. 65s  . Cancer Maternal Aunt        unk type, d. 39s  . Throat cancer Maternal Uncle        d. 61s  . Ovarian cancer Neg Hx      GENETIC COUNSELING/TESTING: See above  SOCIAL HISTORY:  Social History   Socioeconomic History  . Marital status: Single    Spouse name: Not on file  . Number of children: 0  . Years of education: 58  . Highest education level: Not on file  Occupational History  . Occupation: Laborer  Tobacco Use  . Smoking status: Never Smoker  . Smokeless tobacco: Never Used  Substance and Sexual Activity  . Alcohol use: Yes    Comment: seldom  . Drug use: No  . Sexual activity: Not on file  Other Topics Concern  . Not on file  Social History Narrative   Fun: park, walking, music   Denies religious beliefs effecting health care.    Feels safe at home and denies abuse. Lives alone.   Social Determinants of Health   Financial Resource Strain:   . Difficulty of Paying Living Expenses: Not on file  Food Insecurity:   . Worried About Charity fundraiser in the Last Year: Not on file  . Ran Out of Food in the Last Year: Not on file  Transportation Needs:   . Lack of Transportation (Medical): Not on file  . Lack of Transportation (Non-Medical): Not on file  Physical Activity:   . Days of Exercise per Week: Not on file  . Minutes of Exercise per Session: Not on file  Stress:   . Feeling of Stress : Not on file  Social Connections:   . Frequency of Communication  with Friends and Family: Not on file  . Frequency of Social Gatherings with Friends and Family: Not on file  . Attends Religious Services: Not on file  . Active Member of Clubs or Organizations: Not on file  . Attends Archivist Meetings: Not on file  .  Marital Status: Not on file  Intimate Partner Violence:   . Fear of Current or Ex-Partner: Not on file  . Emotionally Abused: Not on file  . Physically Abused: Not on file  . Sexually Abused: Not on file     OBSERVATIONS/OBJECTIVE:  Patient appears well.  She is in no apparent distress.  Mood and behavior are normal.  Breathing is non labored.  Speech is normal.  LABORATORY DATA:  None for this visit.  DIAGNOSTIC IMAGING:  None for this visit.      ASSESSMENT AND PLAN:  Ms.. Chambers is a pleasant 69 y.o. female with Stage IA left breast invasive ductal carcinoma, ER+/PR+/HER2-, diagnosed in 02/2018, treated with neoadjuvant chemotherapy, lumpectomy, adjuvant radiation therapy, and anti-estrogen therapy with Anastrozole beginning in 11/2018.  She presents to the Survivorship Clinic for our initial meeting and routine follow-up post-completion of treatment for breast cancer.    1. Stage IA left breast cancer:  Emily Chambers is continuing to recover from definitive treatment for breast cancer. She will follow-up with her medical oncologist, Dr. Jana Hakim in 08/2019 with history and physical exam per surveillance protocol.  She will continue her anti-estrogen therapy with Anastrozole. Thus far, she is tolerating the Anastrozole well, with minimal side effects.   Her mammogram is due 08/2019. Today, a comprehensive survivorship care plan and treatment summary was reviewed with the patient today detailing her breast cancer diagnosis, treatment course, potential late/long-term effects of treatment, appropriate follow-up care with recommendations for the future, and patient education resources.  A copy of this summary, along with a letter  will be sent to the patient's primary care provider via mail/fax/In Basket message after today's visit.    2. Left breast cellulitis: her breast is unchanged since receiving IV antibiotics yesterday and starting on Bactrim DS.  I placed orders for her to have an ultrasound of the breast to rule out abscess.   3. Bone health:  Given Ms. Scholze's age/history of breast cancer and her current treatment regimen including anti-estrogen therapy with Anastrozole, she is at risk for bone demineralization.  Her last DEXA scan was 02/2016, which was normal.  She was given education on specific activities to promote bone health.  4. Cancer screening:  Due to Ms. Sneath's history and her age, she should receive screening for skin cancers, colon cancer, and gynecologic cancers.  The information and recommendations are listed on the patient's comprehensive care plan/treatment summary and were reviewed in detail with the patient.    5. Health maintenance and wellness promotion: Ms. Ruedas was encouraged to consume 5-7 servings of fruits and vegetables per day. We reviewed the "Nutrition Rainbow" handout, as well as the handout "Take Control of Your Health and Reduce Your Cancer Risk" from the [place].  She was also encouraged to engage in moderate to vigorous exercise for 30 minutes per day most days of the week. We discussed the [employer] fitness program, which is designed for cancer survivors to help them become more physically fit after cancer treatments.  She was instructed to limit her alcohol consumption and continue to abstain from tobacco use.     6. Support services/counseling: It is not uncommon for this period of the patient's cancer care trajectory to be one of many emotions and stressors.  We discussed how this can be increasingly difficult during the times of quarantine and social distancing due to the COVID-19 pandemic.   She was given information regarding our available services and  encouraged to contact  me with any questions or for help enrolling in any of our support group/programs.    Follow up instructions:    -Return to cancer center 08/2019  -Mammogram due in 08/2019 -Follow up with me in 02/2020 -She was recommended to continue with the appropriate pandemic precautions.   She knows to call for any questions that may arise between now and her next appointment.  We are happy to see her sooner if needed.  Total encounter time: 21 minutes of telephone time  Wilber Bihari, NP 04/20/19 9:08 AM Medical Oncology and Hematology [facility] [place], [place] [address] Tel. [phone]    Fax. [phone]  *Total Encounter Time as defined by the Centers for Medicare and Medicaid Services includes, in addition to the face-to-face time of a patient visit (documented in the note above) non-face-to-face time: obtaining and reviewing outside history, ordering and reviewing medications, tests or procedures, care coordination (communications with other health care professionals or caregivers) and documentation in the medical record.

## 2019-04-21 ENCOUNTER — Telehealth: Payer: Self-pay | Admitting: *Deleted

## 2019-04-21 ENCOUNTER — Ambulatory Visit
Admission: RE | Admit: 2019-04-21 | Discharge: 2019-04-21 | Disposition: A | Discharge status: Home / Self Care | Payer: Medicare Other | Source: Ambulatory Visit | Attending: Adult Health | Admitting: Adult Health

## 2019-04-21 ENCOUNTER — Other Ambulatory Visit: Payer: Self-pay

## 2019-04-21 ENCOUNTER — Ambulatory Visit
Admission: RE | Admit: 2019-04-21 | Discharge: 2019-04-21 | Disposition: A | Discharge status: Home / Self Care | Payer: Medicare Other | Source: Ambulatory Visit | Attending: Oncology | Admitting: Oncology

## 2019-04-21 ENCOUNTER — Telehealth: Payer: Self-pay | Admitting: Adult Health

## 2019-04-21 DIAGNOSIS — Z170 Estrogen receptor positive status [ER+]: Secondary | ICD-10-CM

## 2019-04-21 DIAGNOSIS — C50512 Malignant neoplasm of lower-outer quadrant of left female breast: Secondary | ICD-10-CM

## 2019-04-21 DIAGNOSIS — C50412 Malignant neoplasm of upper-outer quadrant of left female breast: Secondary | ICD-10-CM

## 2019-04-21 HISTORY — PX: BREAST CYST ASPIRATION: SHX578

## 2019-04-21 NOTE — Telephone Encounter (Signed)
Scheduled appt per 2/25 sch message - pt is aware of appt date and time   

## 2019-04-21 NOTE — Telephone Encounter (Signed)
Per Wilber Bihari, NP, called to schedule pt for office visit with provider tomorrow. OK per Threasa Beards to add 2hr IV infusion. Message sent to schedule for Lab/Port Flush appt.

## 2019-04-22 ENCOUNTER — Inpatient Hospital Stay: Payer: Medicare Other

## 2019-04-22 ENCOUNTER — Other Ambulatory Visit: Payer: Self-pay

## 2019-04-22 ENCOUNTER — Telehealth: Payer: Self-pay

## 2019-04-22 ENCOUNTER — Inpatient Hospital Stay: Payer: Medicare Other | Admitting: Adult Health

## 2019-04-22 VITALS — BP 121/63 | HR 76 | Temp 98.2°F | Resp 18 | Ht 62.0 in | Wt 176.9 lb

## 2019-04-22 DIAGNOSIS — C50412 Malignant neoplasm of upper-outer quadrant of left female breast: Secondary | ICD-10-CM

## 2019-04-22 DIAGNOSIS — Z170 Estrogen receptor positive status [ER+]: Secondary | ICD-10-CM | POA: Diagnosis not present

## 2019-04-22 DIAGNOSIS — C50512 Malignant neoplasm of lower-outer quadrant of left female breast: Secondary | ICD-10-CM

## 2019-04-22 LAB — CMP (CANCER CENTER ONLY)
ALT: 17 U/L (ref 0–44)
AST: 14 U/L — ABNORMAL LOW (ref 15–41)
Albumin: 4 g/dL (ref 3.5–5.0)
Alkaline Phosphatase: 104 U/L (ref 38–126)
Anion gap: 12 (ref 5–15)
BUN: 19 mg/dL (ref 8–23)
CO2: 24 mmol/L (ref 22–32)
Calcium: 10.6 mg/dL — ABNORMAL HIGH (ref 8.9–10.3)
Chloride: 106 mmol/L (ref 98–111)
Creatinine: 1 mg/dL (ref 0.44–1.00)
GFR, Est AFR Am: >60 mL/min (ref >60)
GFR, Estimated: 58 mL/min — ABNORMAL LOW (ref >60)
Glucose, Bld: 131 mg/dL — ABNORMAL HIGH (ref 70–99)
Potassium: 3.5 mmol/L (ref 3.5–5.1)
Sodium: 142 mmol/L (ref 135–145)
Total Bilirubin: 0.4 mg/dL (ref 0.3–1.2)
Total Protein: 8.7 g/dL — ABNORMAL HIGH (ref 6.5–8.1)

## 2019-04-22 LAB — CBC WITH DIFFERENTIAL (CANCER CENTER ONLY)
Abs Immature Granulocytes: 0.1 10*3/uL — ABNORMAL HIGH (ref 0.00–0.07)
Basophils Absolute: 0.1 10*3/uL (ref 0.0–0.1)
Basophils Relative: 1 %
Eosinophils Absolute: 0.3 10*3/uL (ref 0.0–0.5)
Eosinophils Relative: 4 %
HCT: 40.5 % (ref 36.0–46.0)
Hemoglobin: 12.2 g/dL (ref 12.0–15.0)
Immature Granulocytes: 1 %
Lymphocytes Relative: 17 %
Lymphs Abs: 1.5 10*3/uL (ref 0.7–4.0)
MCH: 27.6 pg (ref 26.0–34.0)
MCHC: 30.1 g/dL (ref 30.0–36.0)
MCV: 91.6 fL (ref 80.0–100.0)
Monocytes Absolute: 0.3 10*3/uL (ref 0.1–1.0)
Monocytes Relative: 4 %
Neutro Abs: 6.3 10*3/uL (ref 1.7–7.7)
Neutrophils Relative %: 73 %
Platelet Count: 290 10*3/uL (ref 150–400)
RBC: 4.42 MIL/uL (ref 3.87–5.11)
RDW: 13.2 % (ref 11.5–15.5)
WBC Count: 8.6 10*3/uL (ref 4.0–10.5)
nRBC: 0 % (ref 0.0–0.2)

## 2019-04-22 MED ORDER — AZITHROMYCIN 250 MG PO TABS: ORAL_TABLET | ORAL | 0 refills | Status: DC

## 2019-04-22 NOTE — Progress Notes (Addendum)
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   She is taking Anastrozole daily.  I did her virtual SCP visit with her a few days ago, and she noted how she had been treated the day prior for breast cellulitis with Emily Chambers, with IV antibiotics and Bactrim DS.  She hasn't noted any improvement in the breast.  I sent her for a breast ultrasound which was completed on 04/21/2019 and potential aspiration, as I was concerned for abscess.  They removed 58ML of clear serous fluids which did not appear infected.  We are following up with her today to examine the breast and to see how she is doing.    REVIEW OF SYSTEMS: She is feeling better since the aspiration.  She denies any fever, chills, spread of the erythema, vomiting, bowel/bladder changes, headaches, vision issues, cough, shortness of breath, chest pain, or palpitations.  A detailed ROS was otherwise non contributory.     HISTORY OF CURRENT ILLNESS: From the original intake note:  Emily Chambers had routine screening mammography on 02/10/2018 showing a possible abnormality in the left breast. She underwent bilateral diagnostic mammography with tomography and left breast ultrasonography at [facility] on 02/16/2018 showing: breast density  category C; two left breast masses, one at 2 o'clock and the other at 3:30 o'clock. The 2 o'clock mass (1.3 x 1 x 1 cm) corresponds to the mammographic abnormality and is highly suspicious for breast carcinoma. The other mass (1.2 x 0.7 x 1.2 cm)  is suspicious for breast carcinoma. No left axillary adenopathy.   Accordingly on 02/19/2018 she proceeded to biopsy of the left breast area in question. The pathology from this procedure ([id number]) showed:  1) 3:30 o'clock specimen showed invasive mammary carcinoma, possibly lobular (weak and atypical e-cadherin expression), grade 2. Prognostic indicators significant for: estrogen receptor, 90% positive and progesterone receptor, 95% positive, both with strong staining intensity. Proliferation marker Ki67 at 5%. HER2 negative (1+).   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 Prognostic indicators significant for: estrogen receptor, 10% positive with moderate staining intensity and progesterone receptor, 0% negative. Proliferation marker Ki67 at 40%. HER2 negative (1+).   The patient's subsequent history is as detailed below.   PAST MEDICAL HISTORY: Past Medical History:  Diagnosis Date  . Cancer [facility])    left breast cancer  . Family history of breast cancer   . Family history of lung cancer   . Family history of throat cancer   . High cholesterol   . Hypertension   . Kidney stone   . Kidney stones   . UTI (lower urinary tract infection)     PAST SURGICAL HISTORY: Past Surgical History:  Procedure Laterality Date  . BREAST EXCISIONAL BIOPSY Left 1999,2005   cysts removed  . BREAST LUMPECTOMY Left 2020  . BREAST LUMPECTOMY WITH RADIOACTIVE SEED AND SENTINEL LYMPH NODE BIOPSY Left 09/01/2018   Procedure:  LEFT BREAST  RADIOACTIVE SEED LUMPECTOMY X2 AND LEFT SENTINEL LYMPH NODE BIOPSY AND MAPPING;  Surgeon: Stark Klein, MD;  Location: [place];  Service: General;  Laterality: Left;  . BREAST SURGERY  Left    cyst and biopsy  . PORT-A-CATH REMOVAL Left 01/06/2019   Procedure: REMOVAL PORT-A-CATH;  Surgeon: Stark Klein, MD;  Location: [place];  Service: General;  Laterality: Left;  . PORTACATH PLACEMENT Left 03/10/2018   Procedure: INSERTION PORT-A-CATH;  Surgeon: Stark Klein, MD;  Location: [place];  Service: General;  Laterality: Left;  . RADIOACTIVE SEED GUIDED EXCISIONAL BREAST BIOPSY Right 09/01/2018   Procedure: RADIOACTIVE SEED GUIDED EXCISIONAL RIGHT BREAST BIOPSY;  Surgeon: Stark Klein, MD;  Location: [place];  Service: General;  Laterality: Right;    FAMILY HISTORY: Family History  Problem Relation Age of Onset  . Hypertension Mother   . Heart disease Mother   . Dementia Mother   . Healthy Father   . Colon cancer Maternal Grandmother   . Cancer Paternal Grandmother        unk type  . Lung cancer Cousin        62s  . Breast cancer Cousin        60s  . Breast cancer Cousin        31s  . Breast cancer Cousin   . Cancer Paternal Aunt        unk type d. 70, possibly pancreatic  . Cancer Paternal Aunt        unk type d. 5s  . Cancer Paternal Uncle        unk type   . Cancer Paternal Uncle        unk type d. 46s  . Cancer Maternal Aunt        unk type, d. 6s  . Throat cancer Maternal Uncle        d. 70s  . Ovarian cancer Neg Hx    Patient father is alive at 23 years old. Patient mother died from heart disease and dementia at age 30.  The patient denies a family hx of ovarian cancer. She has 3 siblings, 1 brother and 2 sisters. She has a maternal cousin diagnosed with lung cancer in her 102s. She has 3 paternal cousins with breast cancer, one was diagnosed in her 83s and has passed away.   GYNECOLOGIC HISTORY:  No LMP recorded. Patient is postmenopausal. Menarche: 69 years old Age at first live birth: n/a GX P 0 LMP 2010 Contraceptive n/a HRT no  Hysterectomy? no BSo? no   SOCIAL HISTORY: She is  single and works as a Scientist, water quality at [employer]). She lives alone, with no pets.    ADVANCED DIRECTIVES: Not in place.  At the 03/03/2018 visit she was given the appropriate documents to complete and notarize at her discretion.  She is planning to name her niece, [name], as her HCPOA.   HEALTH MAINTENANCE: Social History   Tobacco Use  . Smoking status: Never Smoker  . Smokeless tobacco: Never Used  Substance Use Topics  . Alcohol use: Yes    Comment: seldom  . Drug use: No     Colonoscopy: 2014? [facility]  PAP: 02/02/2018  Bone density: 03/15/2016, T-score 0.4, Dr. Alden Hipp   No Known Allergies  Current Outpatient Medications  Medication Sig Dispense Refill  . amLODipine (NORVASC) 10 MG tablet Take 10 mg by mouth every morning.     Marland Kitchen  anastrozole (ARIMIDEX) 1 MG tablet Take 1 tablet (1 mg total) by mouth daily. 90 tablet 4  . aspirin 81 MG chewable tablet Chew 81 mg by mouth daily.    Marland Kitchen azithromycin (ZITHROMAX Z-PAK) 250 MG tablet 2 tabs on day 1 then one tab daily until complete 6 each 0  . losartan (COZAAR) 100 MG tablet Take 100 mg by mouth every morning.     . pravastatin (PRAVACHOL) 20 MG tablet Take 20 mg by mouth every morning.     . sulfamethoxazole-trimethoprim (BACTRIM DS) 800-160 MG tablet Take 1 tablet by mouth 2 (two) times daily. 20 tablet 0   No current facility-administered medications for this visit.    OBJECTIVE:   Vitals:   04/22/19 1215  BP: 121/63  Pulse: 76  Resp: 18  Temp: 98.2 F (36.8 C)  SpO2: 100%   Wt Readings from Last 3 Encounters:  04/22/19 176 lb 14.4 oz (80.2 kg)  04/19/19 177 lb 9.6 oz (80.6 kg)  02/28/19 178 lb (80.7 kg)   Body mass index is 32.36 kg/m.    ECOG FS:1 - Symptomatic but completely ambulatory GENERAL: Patient is a well appearing female in no acute distress HEENT:  Sclerae anicteric.  Oropharynx clear and moist. No ulcerations or evidence of oropharyngeal candidiasis.  Neck is supple.  NODES:  No cervical, supraclavicular, or axillary lymphadenopathy palpated.  BREAST EXAM:  Left breast with swelling, erythema, tenderness, and slight warmth; right breast benign LUNGS:  Clear to auscultation bilaterally.  No wheezes or rhonchi. HEART:  Regular rate and rhythm. No murmur appreciated. ABDOMEN:  Soft, nontender.  Positive, normoactive bowel sounds. No organomegaly palpated. MSK:  No focal spinal tenderness to palpation. Full range of motion bilaterally in the upper extremities. EXTREMITIES:  No peripheral edema.   SKIN:  Clear with no obvious rashes or skin changes. No nail dyscrasia. NEURO:  Nonfocal. Well oriented.  Appropriate affect.     LAB RESULTS:  CMP     Component Value Date/Time   NA 146 (H) 02/28/2019 0914   K 3.4 (L) 02/28/2019 0914   CL 108 02/28/2019 0914   CO2 27 02/28/2019 0914   GLUCOSE 97 02/28/2019 0914   BUN 13 02/28/2019 0914   CREATININE 0.79 02/28/2019 0914   CALCIUM 9.9 02/28/2019 0914   PROT 7.7 02/28/2019 0914   ALBUMIN 4.1 02/28/2019 0914   AST 15 02/28/2019 0914   ALT 23 02/28/2019 0914   ALKPHOS 96 02/28/2019 0914   BILITOT 0.4 02/28/2019 0914   GFRNONAA >60 02/28/2019 0914   GFRAA >60 02/28/2019 0914    No results found for: TOTALPROTELP, ALBUMINELP, A1GS, A2GS, BETS, BETA2SER, GAMS, MSPIKE, SPEI  No results found for: KPAFRELGTCHN, LAMBDASER, KAPLAMBRATIO  Lab Results  Component Value Date   WBC 8.6 04/22/2019   NEUTROABS 6.3 04/22/2019   HGB 12.2 04/22/2019   HCT 40.5 04/22/2019   MCV 91.6 04/22/2019   PLT 290 04/22/2019   No results found for: LABCA2  No components found for: TMLYYT035  No results for input(s): INR in the last 168 hours.  No results found for: LABCA2  No results found for: WSF681  No results found for: EXN170  No results found for: YFV494  No results found for: CA2729  No components found for: HGQUANT  No results found for: CEA1 / No results found for: CEA1   No  results found for: AFPTUMOR  No results found for: [facility]  Appointment on 04/22/2019  Component Date Value Ref Range Status  .  WBC Count 04/22/2019 8.6  4.0 - 10.5 K/uL Final  . RBC 04/22/2019 4.42  3.87 - 5.11 MIL/uL Final  . Hemoglobin 04/22/2019 12.2  12.0 - 15.0 g/dL Final  . HCT 04/22/2019 40.5  36.0 - 46.0 % Final  . MCV 04/22/2019 91.6  80.0 - 100.0 fL Final  . MCH 04/22/2019 27.6  26.0 - 34.0 pg Final  . MCHC 04/22/2019 30.1  30.0 - 36.0 g/dL Final  . RDW 04/22/2019 13.2  11.5 - 15.5 % Final  . Platelet Count 04/22/2019 290  150 - 400 K/uL Final  . nRBC 04/22/2019 0.0  0.0 - 0.2 % Final  . Neutrophils Relative % 04/22/2019 73  % Final  . Neutro Abs 04/22/2019 6.3  1.7 - 7.7 K/uL Final  . Lymphocytes Relative 04/22/2019 17  % Final  . Lymphs Abs 04/22/2019 1.5  0.7 - 4.0 K/uL Final  . Monocytes Relative 04/22/2019 4  % Final  . Monocytes Absolute 04/22/2019 0.3  0.1 - 1.0 K/uL Final  . Eosinophils Relative 04/22/2019 4  % Final  . Eosinophils Absolute 04/22/2019 0.3  0.0 - 0.5 K/uL Final  . Basophils Relative 04/22/2019 1  % Final  . Basophils Absolute 04/22/2019 0.1  0.0 - 0.1 K/uL Final  . Immature Granulocytes 04/22/2019 1  % Final  . Abs Immature Granulocytes 04/22/2019 0.10* 0.00 - 0.07 K/uL Final   Performed at [facility], [place] [address] [place], [place] [address]    No results found for: HGBA, HGBA2QUANT, HGBFQUANT, HGBSQUAN (Hemoglobinopathy evaluation)   No results found for: LDH  No results found for: IRON, TIBC, IRONPCTSAT (Iron and TIBC)  No results found for: FERRITIN  Urinalysis    Component Value Date/Time   COLORURINE YELLOW 04/12/2018 1504   APPEARANCEUR CLEAR 04/12/2018 1504   LABSPEC 1.020 04/12/2018 1504   PHURINE 5.0 04/12/2018 1504   GLUCOSEU NEGATIVE 04/12/2018 1504   HGBUR NEGATIVE 04/12/2018 1504   BILIRUBINUR NEGATIVE 04/12/2018 1504   KETONESUR NEGATIVE 04/12/2018 1504   PROTEINUR NEGATIVE 04/12/2018  1504   UROBILINOGEN 0.2 07/07/2012 1159   NITRITE NEGATIVE 04/12/2018 1504   LEUKOCYTESUR TRACE (A) 04/12/2018 1504     STUDIES: US BREAST LTD UNI LEFT INC AXILLA  Result Date: 04/21/2019 CLINICAL DATA:  As for bilateral diagnostic examination due to a 1 week history of left breast erythema with firm palpable abnormality over the outer breast. Patient was begun on Rocephin 2 days ago for presumed infection. Patient notes slight symptomatic improvement since beginning antibiotics. History of left malignant lumpectomy July 2020 with subsequent chemotherapy and radiation completed September 2020. EXAM: DIGITAL DIAGNOSTIC bilateral MAMMOGRAM WITH CAD AND TOMO ULTRASOUND left BREAST COMPARISON:  Previous exam(s). ACR Breast Density Category c: The breast tissue is heterogeneously dense, which may obscure small masses. FINDINGS: Examination demonstrates post lumpectomy changes over the posterior third of the upper-outer quadrant of the left breast. There is a 6.3 cm partially circumscribed and partially ill-defined oval mass over the lumpectomy site. Mild increased parenchymal markings of the left breast which may be due to patient's previous radiation versus underlying infection. Remainder of the left breast as well as the right breast is unchanged. Mammographic images were processed with CAD. On physical exam, there is mild to moderate erythema over the outer half of the left breast with a large firm palpable mass over the outer left breast from the 1 o'clock to the 5 o'clock position measuring approximately 10 x 10 cm. Targeted ultrasound is performed, showing  a large complicated fluid collection over the outer left breast from the 1 o'clock to the 5 o'clock positions 2 cm to 12 cm from the nipple over the lumpectomy site. This measures approximately 3.3 x 6.5 x 6.6 cm, although is too large to image completely within the field of view. This is compatible with a complicated noninfected versus infected  postoperative fluid collection. IMPRESSION: Post lumpectomy changes over the upper-outer quadrant of the left breast. Large 6.6 cm complicated fluid collection at the lumpectomy site in this patient with clinical presentation of left breast infection. This is compatible with an infectious versus noninfectious postoperative fluid collection. RECOMMENDATION: Recommend attempted aspiration of this complicated left lumpectomy site fluid collection and completion of patient's antibiotic course. Otherwise, recommend continued annual bilateral diagnostic mammographic evaluation. I have discussed the findings and recommendations with the patient. If applicable, a reminder letter will be sent to the patient regarding the next appointment. BI-RADS CATEGORY  2: Benign. Electronically Signed   By: Marin Olp M.D.   On: 04/21/2019 12:07   MM DIAG BREAST TOMO BILATERAL  Result Date: 04/21/2019 CLINICAL DATA:  As for bilateral diagnostic examination due to a 1 week history of left breast erythema with firm palpable abnormality over the outer breast. Patient was begun on Rocephin 2 days ago for presumed infection. Patient notes slight symptomatic improvement since beginning antibiotics. History of left malignant lumpectomy July 2020 with subsequent chemotherapy and radiation completed September 2020. EXAM: DIGITAL DIAGNOSTIC bilateral MAMMOGRAM WITH CAD AND TOMO ULTRASOUND left BREAST COMPARISON:  Previous exam(s). ACR Breast Density Category c: The breast tissue is heterogeneously dense, which may obscure small masses. FINDINGS: Examination demonstrates post lumpectomy changes over the posterior third of the upper-outer quadrant of the left breast. There is a 6.3 cm partially circumscribed and partially ill-defined oval mass over the lumpectomy site. Mild increased parenchymal markings of the left breast which may be due to patient's previous radiation versus underlying infection. Remainder of the left breast as well as the  right breast is unchanged. Mammographic images were processed with CAD. On physical exam, there is mild to moderate erythema over the outer half of the left breast with a large firm palpable mass over the outer left breast from the 1 o'clock to the 5 o'clock position measuring approximately 10 x 10 cm. Targeted ultrasound is performed, showing a large complicated fluid collection over the outer left breast from the 1 o'clock to the 5 o'clock positions 2 cm to 12 cm from the nipple over the lumpectomy site. This measures approximately 3.3 x 6.5 x 6.6 cm, although is too large to image completely within the field of view. This is compatible with a complicated noninfected versus infected postoperative fluid collection. IMPRESSION: Post lumpectomy changes over the upper-outer quadrant of the left breast. Large 6.6 cm complicated fluid collection at the lumpectomy site in this patient with clinical presentation of left breast infection. This is compatible with an infectious versus noninfectious postoperative fluid collection. RECOMMENDATION: Recommend attempted aspiration of this complicated left lumpectomy site fluid collection and completion of patient's antibiotic course. Otherwise, recommend continued annual bilateral diagnostic mammographic evaluation. I have discussed the findings and recommendations with the patient. If applicable, a reminder letter will be sent to the patient regarding the next appointment. BI-RADS CATEGORY  2: Benign. Electronically Signed   By: Marin Olp M.D.   On: 04/21/2019 12:07   US BREAST ASPIRATION LEFT  Result Date: 04/21/2019 CLINICAL DATA:  Patient presents with clinical left breast infection and  palpable complicated postoperative fluid collection at the lumpectomy site in the outer left breast. Patient here today for ultrasound-guided aspiration of this fluid collection. EXAM: ULTRASOUND GUIDED LEFT BREAST CYST ASPIRATION COMPARISON:  Previous exams. PROCEDURE: Using sterile  technique, 1% lidocaine, under direct ultrasound visualization, 13 gauge needle aspiration of the targeted fluid collection over the outer left breast was performed. Approximately 58 mL of clear serous fluid was removed which is diagnostic of a noninfected postoperative seroma. IMPRESSION: Ultrasound-guided aspiration of left breast seroma at the lumpectomy site. No apparent complications. RECOMMENDATIONS: Recommend completion of patient's course of Rocephin and follow-up with her healthcare provider. If infection does not resolve and further question remains of abscess/infected seroma, then reaspiration may be warranted. Electronically Signed   By: Marin Olp M.D.   On: 04/21/2019 12:12     ELIGIBLE FOR AVAILABLE RESEARCH PROTOCOL: Upbeat   ASSESSMENT: 69 y.o. 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, grade 2, estrogen and progesterone receptor positive, HER-2 not amplified, with an MIB-1 of 5%  (b) in the lower outer quadrant a clinical T1c N0, stage IA-B invasive ductal carcinoma, grade 2, estrogen receptor only moderately positive at 10%, progesterone receptor negative, with an MIB-1 of 40%, and HER-2 not amplified  (1) neoadjuvant chemotherapy will consist of doxorubicin and cyclophosphamide in dose dense fashion x4 starting 03/16/2018, 08/14/2018 04/27/2018, followed by paclitaxel and carboplatin weekly x12 starting 05/11/2018, completed 07/27/2018  (a) echo on 03/11/2018 shows well preserved EF of 60-65%  (2) status post left lumpectomy and sentinel lymph node sampling 09/01/2018 showing  (a) invasive ductal carcinoma, grade 2, ypT1b ypN0, triple negative  (b) invasive lobular carcinoma, grade 2, ypT1c ypN0, estrogen and progesterone receptor positive, HER-2 not amplified  (3) adjuvant radiation 10/04/2018 through 11/02/2018 Site Technique Total Dose Dose per Fx  Completed Fx Beam Energies  Breast: Breast_Lt 3D 40.05/40.05 2.67 15/15 6X, 10X  Breast: Breast_Lt_Bst 3D 12/12 2 6/6 6X, 10X    (4) anastrozole (for upper outer quadrant tumor) started 12/06/2018  (a) bone density 03/25/2016 at the breast center showed a T score of 0.4 (normal).  (5) genetics testing on 03/24/2018 showed no pathogenic mutations.  Genes tested include:  APC, ATM, AXIN2, BARD1, BMPR1A, BRCA1, BRCA2, BRIP1, CDH1, CDKN2A (p14ARF), CDKN2A (p16INK4a), CKD4, CHEK2, CTNNA1, DICER1, EPCAM (Deletion/duplication testing only), GREM1 (promoter region deletion/duplication testing only), KIT, MEN1, MLH1, MSH2, MSH3, MSH6, MUTYH, NBN, NF1, NHTL1, PALB2, PDGFRA, PMS2, POLD1, POLE, PTEN, RAD50, RAD51C, RAD51D, SDHB, SDHC, SDHD, SMAD4, SMARCA4. STK11, TP53, TSC1, TSC2, and VHL.  The following genes were evaluated for sequence changes only: SDHA and HOXB13 c.251G>A variant only.   PLAN: Emily Chambers met with myself and Dr. Jana Hakim today.  We examined her breast and discussed her antibiotics with Bactrim.  She will continue her current antibiotics until Saturday.  Additionally we have sent in A zpak for her to take.  She can go ahead and start this.  We will see her back in 1 week for f/u.    Total encounter time: 20 minutes  Wilber Bihari, NP 04/22/19 12:21 PM Medical Oncology and Hematology [facility] [place], [place] [address] Tel. [phone]    Fax. [phone]    ADDENDUM: Emily Chambers's left breast erythema has been present for about a week, without improvement on Bactrim.  She did have about 60 cc of fluid removed.  This appeared not  to be infected.  I did was sent for culture.  The patient denies systemic symptoms  Today there is no leakage.  The breast remains moderately erythematous, not greatly inflamed however.  The erythema does not extend outside the breast.  There is significant scar tissue particularly in the upper outer quadrant which is not  tender.  She has been on Bactrim 3 days.  I suggested she take another 2 days, through this Sunday, February 28.  She will start a Z-Pak today and take that for the next 4 days.  She will see [place] in a week.  If there has not been significant improvement by then we will proceed to additional imaging  I personally saw this patient and performed a substantive portion of this encounter with the listed APP documented above.   Encounter time 20 minutes.Chauncey Cruel, MD Medical Oncology and Hematology [facility] [address] [place],  [address] Tel. [phone]    Fax. [phone]   *Total Encounter Time as defined by the Centers for Medicare and Medicaid Services includes, in addition to the face-to-face time of a patient visit (documented in the note above) non-face-to-face time: obtaining and reviewing outside history, ordering and reviewing medications, tests or procedures, care coordination (communications with other health care professionals or caregivers) and documentation in the medical record.

## 2019-04-22 NOTE — Telephone Encounter (Signed)
Called and given below message. She verbalized understanding. She does not take and calcium supplement and she is going to increase her fluid intake.

## 2019-04-22 NOTE — Telephone Encounter (Signed)
-----   Message from Gardenia Phlegm, NP sent at 04/22/2019  1:03 PM EST ----- Is patient taking a calcium supplement?  IF so, how much?  Also, recommend she increases her fluid intake ----- Message ----- From: Interface, Lab In [place] Sent: 04/22/2019  12:06 PM EST To: Gardenia Phlegm, NP

## 2019-04-25 ENCOUNTER — Telehealth: Payer: Self-pay | Admitting: Adult Health

## 2019-04-25 NOTE — Telephone Encounter (Signed)
I left a message regarding schedule  

## 2019-04-28 ENCOUNTER — Telehealth: Payer: Self-pay

## 2019-04-28 NOTE — Telephone Encounter (Signed)
RN left voicemail for patient to call back [id number].

## 2019-04-29 ENCOUNTER — Other Ambulatory Visit: Payer: Self-pay

## 2019-04-29 ENCOUNTER — Inpatient Hospital Stay: Payer: Medicare Other | Attending: Oncology | Admitting: Adult Health

## 2019-04-29 VITALS — BP 121/67 | HR 86 | Temp 97.4°F | Resp 18 | Ht 62.0 in | Wt 177.4 lb

## 2019-04-29 DIAGNOSIS — Z171 Estrogen receptor negative status [ER-]: Secondary | ICD-10-CM

## 2019-04-29 DIAGNOSIS — Z79811 Long term (current) use of aromatase inhibitors: Secondary | ICD-10-CM | POA: Insufficient documentation

## 2019-04-29 DIAGNOSIS — C50512 Malignant neoplasm of lower-outer quadrant of left female breast: Secondary | ICD-10-CM

## 2019-04-29 DIAGNOSIS — Z170 Estrogen receptor positive status [ER+]: Secondary | ICD-10-CM | POA: Insufficient documentation

## 2019-04-29 DIAGNOSIS — C50412 Malignant neoplasm of upper-outer quadrant of left female breast: Secondary | ICD-10-CM | POA: Insufficient documentation

## 2019-04-29 NOTE — Progress Notes (Signed)
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   She is taking Anastrozole daily.  I saw Emily Chambers earlier this week due to breast infection s/p antibiotics and drainage.  She was recommended to continue on Bactrim and to start Azithromycin.  She is taking both of these and tolerating them well.  She notes her breast is improving, and has no fever, chills, or any other concerns.  REVIEW OF SYSTEMS: A detailed ROS was otherwise non contributory today.  HISTORY OF CURRENT ILLNESS: From the original intake note:  Emily Chambers had routine screening mammography on 02/10/2018 showing a possible abnormality in the left breast. She underwent bilateral diagnostic mammography with tomography and left breast ultrasonography at [facility] on 02/16/2018 showing: breast density category C; two left breast masses, one at 2 o'clock and the other at 3:30 o'clock. The 2 o'clock mass (1.3 x 1 x 1 cm) corresponds to the mammographic abnormality and is highly suspicious for breast carcinoma. The other mass (1.2 x 0.7 x 1.2 cm)  is suspicious for breast carcinoma. No left axillary adenopathy.   Accordingly on 02/19/2018 she proceeded to biopsy of the left breast area in question. The pathology from this  procedure ([id number]) showed:  1) 3:30 o'clock specimen showed invasive mammary carcinoma, possibly lobular (weak and atypical e-cadherin expression), grade 2. Prognostic indicators significant for: estrogen receptor, 90% positive and progesterone receptor, 95% positive, both with strong staining intensity. Proliferation marker Ki67 at 5%. HER2 negative (1+).   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 Prognostic indicators significant for: estrogen receptor, 10% positive with moderate staining intensity and progesterone receptor, 0% negative. Proliferation marker Ki67 at 40%. HER2 negative (1+).   The patient's subsequent history is as detailed below.   PAST MEDICAL HISTORY: Past Medical History:  Diagnosis Date  . Cancer [facility])    left breast cancer  . Family history of breast cancer   . Family history of lung cancer   . Family history of throat cancer   . High cholesterol   . Hypertension   . Kidney stone   . Kidney stones   . UTI (lower urinary tract infection)     PAST SURGICAL HISTORY: Past Surgical History:  Procedure Laterality Date  . BREAST EXCISIONAL BIOPSY Left 1999,2005   cysts removed  . BREAST LUMPECTOMY Left 2020  . BREAST LUMPECTOMY WITH RADIOACTIVE SEED AND SENTINEL LYMPH NODE BIOPSY Left 09/01/2018   Procedure: LEFT BREAST  RADIOACTIVE SEED LUMPECTOMY X2 AND LEFT SENTINEL LYMPH NODE BIOPSY AND MAPPING;  Surgeon: Emily Klein, MD;  Location: [place];  Service: General;  Laterality: Left;  . BREAST SURGERY Left    cyst and biopsy  . PORT-A-CATH REMOVAL Left 01/06/2019   Procedure: REMOVAL PORT-A-CATH;  Surgeon: Emily Klein, MD;  Location: [place];  Service: General;  Laterality: Left;  . PORTACATH PLACEMENT Left 03/10/2018   Procedure:  INSERTION PORT-A-CATH;  Surgeon: Emily Klein, MD;  Location: [place];  Service: General;  Laterality: Left;  . RADIOACTIVE SEED GUIDED EXCISIONAL BREAST  BIOPSY Right 09/01/2018   Procedure: RADIOACTIVE SEED GUIDED EXCISIONAL RIGHT BREAST BIOPSY;  Surgeon: Emily Klein, MD;  Location: [place];  Service: General;  Laterality: Right;    FAMILY HISTORY: Family History  Problem Relation Age of Onset  . Hypertension Mother   . Heart disease Mother   . Dementia Mother   . Healthy Father   . Colon cancer Maternal Grandmother   . Cancer Paternal Grandmother        unk type  . Lung cancer Cousin        45s  . Breast cancer Cousin        11s  . Breast cancer Cousin        85s  . Breast cancer Cousin   . Cancer Paternal Aunt        unk type d. 72, possibly pancreatic  . Cancer Paternal Aunt        unk type d. 60s  . Cancer Paternal Uncle        unk type   . Cancer Paternal Uncle        unk type d. 57s  . Cancer Maternal Aunt        unk type, d. 70s  . Throat cancer Maternal Uncle        d. 61s  . Ovarian cancer Neg Hx    Patient father is alive at 109 years old. Patient mother died from heart disease and dementia at age 63.  The patient denies a family hx of ovarian cancer. She has 3 siblings, 1 brother and 2 sisters. She has a maternal cousin diagnosed with lung cancer in her 41s. She has 3 paternal cousins with breast cancer, one was diagnosed in her 67s and has passed away.   GYNECOLOGIC HISTORY:  No LMP recorded. Patient is postmenopausal. Menarche: 69 years old Age at first live birth: n/a GX P 0 LMP 2010 Contraceptive n/a HRT no  Hysterectomy? no BSo? no   SOCIAL HISTORY: She is single and works as a Scientist, water quality at [employer]). She lives alone, with no pets.    ADVANCED DIRECTIVES: Not in place.  At the 03/03/2018 visit she was given the appropriate documents to complete and notarize at her discretion.  She is planning to name her niece, Emily Chambers, as her HCPOA.   HEALTH MAINTENANCE: Social History   Tobacco Use  . Smoking status: Never Smoker  . Smokeless  tobacco: Never Used  Substance Use Topics  . Alcohol use: Yes    Comment: seldom  . Drug use: No     Colonoscopy: 2014? [facility]  PAP: 02/02/2018  Bone density: 03/15/2016, T-score 0.4, Dr. Alden Chambers   No Known Allergies  Current Outpatient Medications  Medication Sig Dispense Refill  . amLODipine (NORVASC) 10 MG tablet Take 10 mg by mouth every morning.     Marland Kitchen anastrozole (ARIMIDEX) 1 MG tablet Take 1 tablet (1 mg total) by mouth daily. 90 tablet 4  . aspirin 81 MG chewable tablet Chew 81 mg by mouth daily.    Marland Kitchen azithromycin (ZITHROMAX Z-PAK) 250 MG tablet 2 tabs on day 1 then one tab daily until complete 6 each 0  . losartan (COZAAR) 100 MG tablet Take 100 mg by mouth every morning.     . pravastatin (PRAVACHOL) 20 MG  tablet Take 20 mg by mouth every morning.     . sulfamethoxazole-trimethoprim (BACTRIM DS) 800-160 MG tablet Take 1 tablet by mouth 2 (two) times daily. 20 tablet 0   No current facility-administered medications for this visit.    OBJECTIVE:   Vitals:   04/29/19 1314  BP: 121/67  Pulse: 86  Resp: 18  Temp: (!) 97.4 F (36.3 C)  SpO2: 99%   Wt Readings from Last 3 Encounters:  04/29/19 177 lb 6.4 oz (80.5 kg)  04/22/19 176 lb 14.4 oz (80.2 kg)  04/19/19 177 lb 9.6 oz (80.6 kg)   Body mass index is 32.45 kg/m.   Left breast s/p lumpectomy and radiation, seroma noted in left upper outer quadrant, breast erythema and warmth is improved.      LAB RESULTS:  CMP     Component Value Date/Time   NA 142 04/22/2019 1150   K 3.5 04/22/2019 1150   CL 106 04/22/2019 1150   CO2 24 04/22/2019 1150   GLUCOSE 131 (H) 04/22/2019 1150   BUN 19 04/22/2019 1150   CREATININE 1.00 04/22/2019 1150   CALCIUM 10.6 (H) 04/22/2019 1150   PROT 8.7 (H) 04/22/2019 1150   ALBUMIN 4.0 04/22/2019 1150   AST 14 (L) 04/22/2019 1150   ALT 17 04/22/2019 1150   ALKPHOS 104 04/22/2019 1150   BILITOT 0.4 04/22/2019 1150   GFRNONAA 58 (L) 04/22/2019 1150   GFRAA >60  04/22/2019 1150    No results found for: TOTALPROTELP, ALBUMINELP, A1GS, A2GS, BETS, BETA2SER, GAMS, MSPIKE, SPEI  No results found for: KPAFRELGTCHN, LAMBDASER, KAPLAMBRATIO  Lab Results  Component Value Date   WBC 8.6 04/22/2019   NEUTROABS 6.3 04/22/2019   HGB 12.2 04/22/2019   HCT 40.5 04/22/2019   MCV 91.6 04/22/2019   PLT 290 04/22/2019   No results found for: LABCA2  No components found for: RKYHCW237  No results for input(s): INR in the last 168 hours.  No results found for: LABCA2  No results found for: SEG315  No results found for: VVO160  No results found for: VPX106  No results found for: CA2729  No components found for: HGQUANT  No results found for: CEA1 / No results found for: CEA1   No results found for: AFPTUMOR  No results found for: [place]  No visits with results within 3 Day(s) from this visit.  Latest known visit with results is:  Appointment on 04/22/2019  Component Date Value Ref Range Status  . Sodium 04/22/2019 142  135 - 145 mmol/L Final  . Potassium 04/22/2019 3.5  3.5 - 5.1 mmol/L Final  . Chloride 04/22/2019 106  98 - 111 mmol/L Final  . CO2 04/22/2019 24  22 - 32 mmol/L Final  . Glucose, Bld 04/22/2019 131* 70 - 99 mg/dL Final   Glucose reference range applies only to samples taken after fasting for at least 8 hours.  . BUN 04/22/2019 19  8 - 23 mg/dL Final  . Creatinine 04/22/2019 1.00  0.44 - 1.00 mg/dL Final  . Calcium 04/22/2019 10.6* 8.9 - 10.3 mg/dL Final  . Total Protein 04/22/2019 8.7* 6.5 - 8.1 g/dL Final  . Albumin 04/22/2019 4.0  3.5 - 5.0 g/dL Final  . AST 04/22/2019 14* 15 - 41 U/L Final  . ALT 04/22/2019 17  0 - 44 U/L Final  . Alkaline Phosphatase 04/22/2019 104  38 - 126 U/L Final  . Total Bilirubin 04/22/2019 0.4  0.3 - 1.2 mg/dL Final  . GFR, Est Non  Af Am 04/22/2019 58* >60 mL/min Final  . GFR, Est AFR Am 04/22/2019 >60  >60 mL/min Final  . Anion gap 04/22/2019 12  5 - 15 Final   Performed at [facility], [place] [address], [place], [place] [address]  . WBC Count 04/22/2019 8.6  4.0 - 10.5 K/uL Final  . RBC 04/22/2019 4.42  3.87 - 5.11 MIL/uL Final  . Hemoglobin 04/22/2019 12.2  12.0 - 15.0 g/dL Final  . HCT 04/22/2019 40.5  36.0 - 46.0 % Final  . MCV 04/22/2019 91.6  80.0 - 100.0 fL Final  . MCH 04/22/2019 27.6  26.0 - 34.0 pg Final  . MCHC 04/22/2019 30.1  30.0 - 36.0 g/dL Final  . RDW 04/22/2019 13.2  11.5 - 15.5 % Final  . Platelet Count 04/22/2019 290  150 - 400 K/uL Final  . nRBC 04/22/2019 0.0  0.0 - 0.2 % Final  . Neutrophils Relative % 04/22/2019 73  % Final  . Neutro Abs 04/22/2019 6.3  1.7 - 7.7 K/uL Final  . Lymphocytes Relative 04/22/2019 17  % Final  . Lymphs Abs 04/22/2019 1.5  0.7 - 4.0 K/uL Final  . Monocytes Relative 04/22/2019 4  % Final  . Monocytes Absolute 04/22/2019 0.3  0.1 - 1.0 K/uL Final  . Eosinophils Relative 04/22/2019 4  % Final  . Eosinophils Absolute 04/22/2019 0.3  0.0 - 0.5 K/uL Final  . Basophils Relative 04/22/2019 1  % Final  . Basophils Absolute 04/22/2019 0.1  0.0 - 0.1 K/uL Final  . Immature Granulocytes 04/22/2019 1  % Final  . Abs Immature Granulocytes 04/22/2019 0.10* 0.00 - 0.07 K/uL Final   Performed at [facility], [place] [address], [place],  [address]    No results found for: HGBA, HGBA2QUANT, HGBFQUANT, HGBSQUAN (Hemoglobinopathy evaluation)   No results found for: LDH  No results found for: IRON, TIBC, IRONPCTSAT (Iron and TIBC)  No results found for: FERRITIN  Urinalysis    Component Value Date/Time   COLORURINE YELLOW 04/12/2018 1504   APPEARANCEUR CLEAR 04/12/2018 1504   LABSPEC 1.020 04/12/2018 1504   PHURINE 5.0 04/12/2018 1504   GLUCOSEU NEGATIVE 04/12/2018 1504   HGBUR NEGATIVE 04/12/2018 1504   BILIRUBINUR NEGATIVE 04/12/2018 1504   KETONESUR NEGATIVE 04/12/2018 1504   PROTEINUR NEGATIVE 04/12/2018 1504   UROBILINOGEN 0.2 07/07/2012 1159   NITRITE  NEGATIVE 04/12/2018 1504   LEUKOCYTESUR TRACE (A) 04/12/2018 1504     STUDIES: US BREAST LTD UNI LEFT INC AXILLA  Result Date: 04/21/2019 CLINICAL DATA:  As for bilateral diagnostic examination due to a 1 week history of left breast erythema with firm palpable abnormality over the outer breast. Patient was begun on Rocephin 2 days ago for presumed infection. Patient notes slight symptomatic improvement since beginning antibiotics. History of left malignant lumpectomy July 2020 with subsequent chemotherapy and radiation completed September 2020. EXAM: DIGITAL DIAGNOSTIC bilateral MAMMOGRAM WITH CAD AND TOMO ULTRASOUND left BREAST COMPARISON:  Previous exam(s). ACR Breast Density Category c: The breast tissue is heterogeneously dense, which may obscure small masses. FINDINGS: Examination demonstrates post lumpectomy changes over the posterior third of the upper-outer quadrant of the left breast. There is a 6.3 cm partially circumscribed and partially ill-defined oval mass over the lumpectomy site. Mild increased parenchymal markings of the left breast which may be due to patient's previous radiation versus underlying infection. Remainder of the left breast as well as the right breast is unchanged. Mammographic images were processed with  CAD. On physical exam, there is mild to moderate erythema over the outer half of the left breast with a large firm palpable mass over the outer left breast from the 1 o'clock to the 5 o'clock position measuring approximately 10 x 10 cm. Targeted ultrasound is performed, showing a large complicated fluid collection over the outer left breast from the 1 o'clock to the 5 o'clock positions 2 cm to 12 cm from the nipple over the lumpectomy site. This measures approximately 3.3 x 6.5 x 6.6 cm, although is too large to image completely within the field of view. This is compatible with a complicated noninfected versus infected postoperative fluid collection. IMPRESSION: Post lumpectomy  changes over the upper-outer quadrant of the left breast. Large 6.6 cm complicated fluid collection at the lumpectomy site in this patient with clinical presentation of left breast infection. This is compatible with an infectious versus noninfectious postoperative fluid collection. RECOMMENDATION: Recommend attempted aspiration of this complicated left lumpectomy site fluid collection and completion of patient's antibiotic course. Otherwise, recommend continued annual bilateral diagnostic mammographic evaluation. I have discussed the findings and recommendations with the patient. If applicable, a reminder letter will be sent to the patient regarding the next appointment. BI-RADS CATEGORY  2: Benign. Electronically Signed   By: Marin Olp M.D.   On: 04/21/2019 12:07   MM DIAG BREAST TOMO BILATERAL  Result Date: 04/21/2019 CLINICAL DATA:  As for bilateral diagnostic examination due to a 1 week history of left breast erythema with firm palpable abnormality over the outer breast. Patient was begun on Rocephin 2 days ago for presumed infection. Patient notes slight symptomatic improvement since beginning antibiotics. History of left malignant lumpectomy July 2020 with subsequent chemotherapy and radiation completed September 2020. EXAM: DIGITAL DIAGNOSTIC bilateral MAMMOGRAM WITH CAD AND TOMO ULTRASOUND left BREAST COMPARISON:  Previous exam(s). ACR Breast Density Category c: The breast tissue is heterogeneously dense, which may obscure small masses. FINDINGS: Examination demonstrates post lumpectomy changes over the posterior third of the upper-outer quadrant of the left breast. There is a 6.3 cm partially circumscribed and partially ill-defined oval mass over the lumpectomy site. Mild increased parenchymal markings of the left breast which may be due to patient's previous radiation versus underlying infection. Remainder of the left breast as well as the right breast is unchanged. Mammographic images were  processed with CAD. On physical exam, there is mild to moderate erythema over the outer half of the left breast with a large firm palpable mass over the outer left breast from the 1 o'clock to the 5 o'clock position measuring approximately 10 x 10 cm. Targeted ultrasound is performed, showing a large complicated fluid collection over the outer left breast from the 1 o'clock to the 5 o'clock positions 2 cm to 12 cm from the nipple over the lumpectomy site. This measures approximately 3.3 x 6.5 x 6.6 cm, although is too large to image completely within the field of view. This is compatible with a complicated noninfected versus infected postoperative fluid collection. IMPRESSION: Post lumpectomy changes over the upper-outer quadrant of the left breast. Large 6.6 cm complicated fluid collection at the lumpectomy site in this patient with clinical presentation of left breast infection. This is compatible with an infectious versus noninfectious postoperative fluid collection. RECOMMENDATION: Recommend attempted aspiration of this complicated left lumpectomy site fluid collection and completion of patient's antibiotic course. Otherwise, recommend continued annual bilateral diagnostic mammographic evaluation. I have discussed the findings and recommendations with the patient. If applicable, a reminder letter will  be sent to the patient regarding the next appointment. BI-RADS CATEGORY  2: Benign. Electronically Signed   By: Marin Olp M.D.   On: 04/21/2019 12:07   US BREAST ASPIRATION LEFT  Result Date: 04/21/2019 CLINICAL DATA:  Patient presents with clinical left breast infection and palpable complicated postoperative fluid collection at the lumpectomy site in the outer left breast. Patient here today for ultrasound-guided aspiration of this fluid collection. EXAM: ULTRASOUND GUIDED LEFT BREAST CYST ASPIRATION COMPARISON:  Previous exams. PROCEDURE: Using sterile technique, 1% lidocaine, under direct ultrasound  visualization, 13 gauge needle aspiration of the targeted fluid collection over the outer left breast was performed. Approximately 58 mL of clear serous fluid was removed which is diagnostic of a noninfected postoperative seroma. IMPRESSION: Ultrasound-guided aspiration of left breast seroma at the lumpectomy site. No apparent complications. RECOMMENDATIONS: Recommend completion of patient's course of Rocephin and follow-up with her healthcare provider. If infection does not resolve and further question remains of abscess/infected seroma, then reaspiration may be warranted. Electronically Signed   By: Marin Olp M.D.   On: 04/21/2019 12:12     ELIGIBLE FOR AVAILABLE RESEARCH PROTOCOL: Upbeat   ASSESSMENT: 69 y.o. 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, grade 2, estrogen and progesterone receptor positive, HER-2 not amplified, with an MIB-1 of 5%  (b) in the lower outer quadrant a clinical T1c N0, stage IA-B invasive ductal carcinoma, grade 2, estrogen receptor only moderately positive at 10%, progesterone receptor negative, with an MIB-1 of 40%, and HER-2 not amplified  (1) neoadjuvant chemotherapy will consist of doxorubicin and cyclophosphamide in dose dense fashion x4 starting 03/16/2018, 08/14/2018 04/27/2018, followed by paclitaxel and carboplatin weekly x12 starting 05/11/2018, completed 07/27/2018  (a) echo on 03/11/2018 shows well preserved EF of 60-65%  (2) status post left lumpectomy and sentinel lymph node sampling 09/01/2018 showing  (a) invasive ductal carcinoma, grade 2, ypT1b ypN0, triple negative  (b) invasive lobular carcinoma, grade 2, ypT1c ypN0, estrogen and progesterone receptor positive, HER-2 not amplified  (3) adjuvant radiation 10/04/2018 through 11/02/2018 Site Technique Total Dose Dose per Fx Completed Fx Beam Energies  Breast: Breast_Lt 3D  40.05/40.05 2.67 15/15 6X, 10X  Breast: Breast_Lt_Bst 3D 12/12 2 6/6 6X, 10X    (4) anastrozole (for upper outer quadrant tumor) started 12/06/2018  (a) bone density 03/25/2016 at the breast center showed a T score of 0.4 (normal).  (5) genetics testing on 03/24/2018 showed no pathogenic mutations.  Genes tested include:  APC, ATM, AXIN2, BARD1, BMPR1A, BRCA1, BRCA2, BRIP1, CDH1, CDKN2A (p14ARF), CDKN2A (p16INK4a), CKD4, CHEK2, CTNNA1, DICER1, EPCAM (Deletion/duplication testing only), GREM1 (promoter region deletion/duplication testing only), KIT, MEN1, MLH1, MSH2, MSH3, MSH6, MUTYH, NBN, NF1, NHTL1, PALB2, PDGFRA, PMS2, POLD1, POLE, PTEN, RAD50, RAD51C, RAD51D, SDHB, SDHC, SDHD, SMAD4, SMARCA4. STK11, TP53, TSC1, TSC2, and VHL.  The following genes were evaluated for sequence changes only: SDHA and HOXB13 c.251G>A variant only.   PLAN: Dovey's breast is improving, from an infection standpoint.  She will continue on her antibiotics until complete.  She still feels like she has some fluid that has re accumulated in her breast.  I sent a message to Dr. Barry Dienes and her CMA to see if she could get in next week for follow up.     Total encounter time: 20 minutes  Wilber Bihari, NP 04/29/19 1:47 PM Medical Oncology and Hematology [facility]  [place], [place] [address] Tel. [phone]    Fax. [phone]

## 2019-05-12 ENCOUNTER — Other Ambulatory Visit: Payer: Self-pay | Admitting: General Surgery

## 2019-05-12 DIAGNOSIS — C50812 Malignant neoplasm of overlapping sites of left female breast: Secondary | ICD-10-CM

## 2019-05-19 ENCOUNTER — Other Ambulatory Visit: Payer: Self-pay

## 2019-05-19 ENCOUNTER — Ambulatory Visit: Payer: Medicare Other | Attending: General Surgery

## 2019-05-19 DIAGNOSIS — R293 Abnormal posture: Secondary | ICD-10-CM | POA: Insufficient documentation

## 2019-05-19 DIAGNOSIS — M25612 Stiffness of left shoulder, not elsewhere classified: Secondary | ICD-10-CM | POA: Diagnosis present

## 2019-05-19 DIAGNOSIS — C50512 Malignant neoplasm of lower-outer quadrant of left female breast: Secondary | ICD-10-CM | POA: Diagnosis present

## 2019-05-19 DIAGNOSIS — Z170 Estrogen receptor positive status [ER+]: Secondary | ICD-10-CM | POA: Insufficient documentation

## 2019-05-19 DIAGNOSIS — I890 Lymphedema, not elsewhere classified: Secondary | ICD-10-CM | POA: Insufficient documentation

## 2019-05-19 DIAGNOSIS — M6281 Muscle weakness (generalized): Secondary | ICD-10-CM | POA: Diagnosis present

## 2019-05-19 DIAGNOSIS — C50412 Malignant neoplasm of upper-outer quadrant of left female breast: Secondary | ICD-10-CM | POA: Insufficient documentation

## 2019-05-19 NOTE — Therapy (Signed)
[place], [place], [address] Phone: [phone]   Fax:  [phone]  Physical Therapy Evaluation  Patient Details  Name: Emily Chambers MRN: [id number] Date of Birth: August 18, 1950 Referring Provider (PT): Stark Klein MD   Encounter Date: 05/19/2019  PT End of Session - 05/19/19 1549    Visit Number  1    Number of Visits  9    Date for PT Re-Evaluation  06/23/19    PT Start Time  1504    PT Stop Time  1540    PT Time Calculation (min)  36 min    Activity Tolerance  Patient tolerated treatment well    Behavior During Therapy  [facility] for tasks assessed/performed       Past Medical History:  Diagnosis Date  . Cancer [facility])    left breast cancer  . Family history of breast cancer   . Family history of lung cancer   . Family history of throat cancer   . High cholesterol   . Hypertension   . Kidney stone   . Kidney stones   . UTI (lower urinary tract infection)     Past Surgical History:  Procedure Laterality Date  . BREAST EXCISIONAL BIOPSY Left 1999,2005   cysts removed  . BREAST LUMPECTOMY Left 2020  . BREAST LUMPECTOMY WITH RADIOACTIVE SEED AND SENTINEL LYMPH NODE BIOPSY Left 09/01/2018   Procedure: LEFT BREAST  RADIOACTIVE SEED LUMPECTOMY X2 AND LEFT SENTINEL LYMPH NODE BIOPSY AND MAPPING;  Surgeon: Stark Klein, MD;  Location: [place];  Service: General;  Laterality: Left;  . BREAST SURGERY Left    cyst and biopsy  . PORT-A-CATH REMOVAL Left 01/06/2019   Procedure: REMOVAL PORT-A-CATH;  Surgeon: Stark Klein, MD;  Location: [place];  Service: General;  Laterality: Left;  . PORTACATH PLACEMENT Left 03/10/2018   Procedure: INSERTION PORT-A-CATH;  Surgeon: Stark Klein, MD;  Location: [place];  Service: General;  Laterality: Left;  . RADIOACTIVE SEED GUIDED EXCISIONAL BREAST BIOPSY Right 09/01/2018   Procedure: RADIOACTIVE SEED GUIDED  EXCISIONAL RIGHT BREAST BIOPSY;  Surgeon: Stark Klein, MD;  Location: [place];  Service: General;  Laterality: Right;    There were no vitals filed for this visit.   Subjective Assessment - 05/19/19 1507    Subjective  Pt reports that she has noticed swelling in her L breast that started about the end of January. She states that she notified her MD and was given an antibiotic. She states that she had an ultrasound and had some fluid aspirated. She states that she has stopped wearing an underwire bra at this time.    Pertinent History  chemotherapy followed by 2 lumpectomy of the L breast on 09/01/2018 with 2 lymph node removal and radiation that she finished on 11/02/2018    Patient Stated Goals  I want to get the swelling down.    Currently in Pain?  No/denies         [facility] PT Assessment - 05/19/19 0001      Assessment   Medical Diagnosis  L breast cancer     Referring Provider (PT)  Stark Klein MD    Onset Date/Surgical Date  09/01/18    Hand Dominance  Right    Next MD Visit  06/02/2019    Prior Therapy  Yes right after surgery       Precautions   Precautions  Other (comment)    Precaution  Comments  lymphedema and hx radiation/cancer      Restrictions   Weight Bearing Restrictions  No      Balance Screen   Has the patient fallen in the past 6 months  No    Has the patient had a decrease in activity level because of a fear of falling?   No    Is the patient reluctant to leave their home because of a fear of falling?   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      Cognition   Overall Cognitive Status  Within Functional Limits for tasks assessed      Observation/Other Assessments   Observations  fibrotic changes in the lateral aspect of the L breast, with thickening of the skin  and fluid build up      ROM / Strength   AROM / PROM / Strength  AROM      AROM   AROM Assessment Site  Shoulder    Right/Left Shoulder  Right;Left    Right Shoulder Flexion  144 Degrees    Right Shoulder ABduction  143 Degrees    Right Shoulder Internal Rotation  83 Degrees    Right Shoulder External Rotation  55 Degrees    Left Shoulder Flexion  140 Degrees    Left Shoulder ABduction  120 Degrees    Left Shoulder Internal Rotation  87 Degrees    Left Shoulder External Rotation  66 Degrees        LYMPHEDEMA/ONCOLOGY QUESTIONNAIRE - 05/19/19 1514      Type   Cancer Type  L breast cancer      Surgeries   Lumpectomy Date  09/01/18    Sentinel Lymph Node Biopsy Date  09/01/18    Number Lymph Nodes Removed  2      Treatment   Active Chemotherapy Treatment  No    Past Chemotherapy Treatment  Yes    Active Radiation Treatment  No    Past Radiation Treatment  Yes    Body Site  L breast/axilla    Current Hormone Treatment  Yes    Drug Name  anastrozole      What other symptoms do you have   Are you Having Heaviness or Tightness  Yes    Are you having Pain  Yes    Are you having pitting edema  No    Is it Hard or Difficult finding clothes that fit  No    Do you have infections  Yes    Comments  1x    Is there Decreased scar mobility  Yes      Lymphedema Assessments   Lymphedema Assessments  Upper extremities      Right Upper Extremity Lymphedema   15 cm Proximal to Olecranon Process  32 cm    10 cm Proximal to Olecranon Process  31.7 cm    Olecranon Process  27.3 cm    15 cm Proximal to Ulnar Styloid Process  26.4 cm    10 cm Proximal to Ulnar Styloid Process  24.2 cm    Just Proximal to Ulnar Styloid Process  18.8 cm    Across Hand at [employer]  18.9 cm    At [place] of 2nd Digit  6.3  cm      Left Upper Extremity Lymphedema   15 cm Proximal to Olecranon Process  32.4 cm    10 cm Proximal to Olecranon Process  32.5 cm    Olecranon Process  27.5 cm    15 cm  Proximal to Ulnar Styloid Process  26.4 cm    10 cm Proximal to Ulnar Styloid Process  23 cm    Just Proximal to Ulnar Styloid Process  18.2 cm    Across Hand at [employer]  19 cm    At [place] of 2nd Digit  6.4 cm             Objective measurements completed on examination: See above findings.      [place] Adult PT Treatment/Exercise - 05/19/19 0001      Manual Therapy   Manual Therapy  Edema management    Edema Management  1/2 inch gray foam pad for the L lateral breast             PT Education - 05/19/19 1548    Education Details  Pt was educated on the anatomy and physiology of the lymphatic system. Discussed the role of compression and manual lymph drainage in managing fluid. Discussed lymphedema risk following lumpectomy with radiation and the role of inflammation and immobility in regards to lymphedema. Pt was educated on the use of gray 1/2 inch foam pad and the use to help facilitate fluid flow out of the breast and soften fibrotic tissue.Pt was educated on risk reduction practices and provided with a handout.    Person(s) Educated  Patient    Methods  Explanation;Demonstration;Handout    Comprehension  Verbalized understanding       PT Short Term Goals - 05/19/19 1553      PT SHORT TERM GOAL #1   Title  Pt will be independent with HEp and self MLD within 2 weeks in order to demonstrate autonomy of care.    Baseline  pt is not performing an HEP and does not know how to perform MLD    Time  2    Period  Weeks    Status  New    Target Date  06/09/19        PT Long Term Goals - 05/19/19 1554      PT LONG TERM GOAL #1   Title  Pt will have a compression bra to wear on a daily basis in order to manage swelling in the L breast.    Baseline  pt does not have a compression bra. She does have a compression sleeve.    Time  4    Period  Weeks    Status  New    Target Date  06/23/19      PT LONG TERM GOAL #2   Title  Pt will demonstrate 140 degrees L  shoulder abduction within 4 weeks in order to demonstrate improved functional ROM.    Baseline  L shoulder abduction 120    Time  4    Period  Weeks    Status  New    Target Date  06/23/19      PT LONG TERM GOAL #3   Title  Pt will demonstrate softening and decrease density of tissue in the L lateral breast to demonstrate decreased fluid in order to decrease risk for infection within 4 weeks.    Baseline  pt currently has thickening of the L breast and skin due to edema in the  L breast    Time  4    Period  Weeks    Status  New    Target Date  06/23/19             Plan - 05/19/19 1549    Clinical Impression Statement  Pt presents to physical therapy with increased edema in her L breast compared to her R breast. No noted edemain her LUE compared to the RUE at this time. Fibrotic tissue and thickening noted on the lateral aspect of the L breast with indentations and 1x thick crevice. Pt has decreased ROM into abduction of the L shoulder compared to the R shoulder with no cording noted this session but pt does have decreased scar mobility. Pt will benefit from skilled physical therapy services 2x/week for 4 weeks if pt is able due to financial concerns related to co-pay in order to address the above limitations to decrease risk for immobility and infection related to scarring and edema.    Stability/Clinical Decision Making  Stable/Uncomplicated    Clinical Decision Making  Low    Rehab Potential  Good    PT Frequency  2x / week    PT Duration  4 weeks    PT Treatment/Interventions  Moist Heat;Therapeutic activities;Therapeutic exercise;Neuromuscular re-education;Manual techniques    PT Next Visit Plan  begin MLD and teaching self MLD, ask how the foam pad worked, see if pt has been to second to nature, begin stretching for the L shoulder and myofascial release/STM    PT Home Exercise Plan  post-op exercises    Consulted and Agree with Plan of Care  Patient       Patient will  benefit from skilled therapeutic intervention in order to improve the following deficits and impairments:  Increased edema, Decreased range of motion, Decreased knowledge of precautions  Visit Diagnosis: Malignant neoplasm of upper-outer quadrant of left breast in female, estrogen receptor positive ([place])  Malignant neoplasm of lower-outer quadrant of left breast of female, estrogen receptor positive ([place])  Stiffness of left shoulder, not elsewhere classified  Lymphedema, not elsewhere classified     Problem List Patient Active Problem List   Diagnosis Date Noted  . Genetic testing 03/24/2018  . Port-A-Cath in place 03/17/2018  . Family history of breast cancer   . Family history of throat cancer   . Family history of lung cancer   . Malignant neoplasm of lower-outer quadrant of left breast of female, estrogen receptor negative ([place]) 02/26/2018  . Malignant neoplasm of upper-outer quadrant of left breast in female, estrogen receptor positive ([place]) 02/26/2018  . Essential hypertension 10/23/2014  . Hyperlipidemia 10/23/2014    Ander Purpura, PT 05/19/2019, 3:56 PM  [place], [place], [address] Phone: [phone]   Fax:  [phone]  Name: Emily Chambers MRN: [id number] Date of Birth: 1951-02-19

## 2019-05-23 ENCOUNTER — Ambulatory Visit: Payer: Medicare Other

## 2019-05-23 ENCOUNTER — Other Ambulatory Visit: Payer: Self-pay

## 2019-05-23 DIAGNOSIS — C50512 Malignant neoplasm of lower-outer quadrant of left female breast: Secondary | ICD-10-CM

## 2019-05-23 DIAGNOSIS — C50412 Malignant neoplasm of upper-outer quadrant of left female breast: Secondary | ICD-10-CM

## 2019-05-23 DIAGNOSIS — I890 Lymphedema, not elsewhere classified: Secondary | ICD-10-CM

## 2019-05-23 DIAGNOSIS — Z170 Estrogen receptor positive status [ER+]: Secondary | ICD-10-CM

## 2019-05-23 DIAGNOSIS — R293 Abnormal posture: Secondary | ICD-10-CM

## 2019-05-23 DIAGNOSIS — M6281 Muscle weakness (generalized): Secondary | ICD-10-CM

## 2019-05-23 DIAGNOSIS — M25612 Stiffness of left shoulder, not elsewhere classified: Secondary | ICD-10-CM

## 2019-05-23 NOTE — Patient Instructions (Signed)
Self manual lymph drainage: Perform this sequence once a day.  Only give enough pressure no your skin to make the skin move.  Diaphragmatic - Supine   Inhale through nose making navel move out toward hands. Exhale through puckered lips, hands follow navel in. Repeat _5__ times. Rest _10__ seconds between repeats.   Copyright  VHI. All rights reserved.  Hug yourself.  Do circles at your neck just above your collarbones.  Repeat this 10 times.  Axilla - One at a Time   Using full weight of flat hand and fingers at center of both armpits, make _10__ in-place circles.   Copyright  VHI. All rights reserved.  LEG: Inguinal Nodes Stimulation   With small finger side of hand against hip crease on involved side, gently perform circles at the crease. Repeat __10_ times.   Copyright  VHI. All rights reserved.  1) Axilla to Inguinal Nodes - Sweep   On involved side, sweep _4__ times from armpit along side of trunk to hip crease.  Now gently stretch skin from the involved side to the uninvolved side across the chest at the shoulder line.  Repeat that 4 times.  Draw an imaginary diagonal line from upper outer breast through the nipple area toward lower inner breast.  Direct fluid upward and inward from this line toward the pathway across your upper chest .  Do this in three rows to treat all of the upper inner breast tissue, and do each row 3-4x.      Direct fluid to treat all of lower outer breast tissue downward and outward toward      pathway that is aimed at the left groin.                    Finish by doing the pathways as described above going from your involved armpit to the same side groin and going across your upper chest from the involved shoulder to the uninvolved shoulder.  Repeat the steps above where you do circles in your left groin and right armpit. Copyright  VHI. All rights reserved.      

## 2019-05-23 NOTE — Therapy (Signed)
[place], [place], [address] Phone: [phone]   Fax:  [phone]  Physical Therapy Treatment  Patient Details  Name: Emily Chambers MRN: [id number] Date of Birth: 1950/08/31 Referring Provider (PT): Stark Klein MD   Encounter Date: 05/23/2019  PT End of Session - 05/23/19 0910    Visit Number  2    Number of Visits  9    Date for PT Re-Evaluation  06/23/19    PT Start Time  0907    PT Stop Time  1000    PT Time Calculation (min)  53 min    Activity Tolerance  Patient tolerated treatment well    Behavior During Therapy  [facility] for tasks assessed/performed       Past Medical History:  Diagnosis Date  . Cancer [facility])    left breast cancer  . Family history of breast cancer   . Family history of lung cancer   . Family history of throat cancer   . High cholesterol   . Hypertension   . Kidney stone   . Kidney stones   . UTI (lower urinary tract infection)     Past Surgical History:  Procedure Laterality Date  . BREAST EXCISIONAL BIOPSY Left 1999,2005   cysts removed  . BREAST LUMPECTOMY Left 2020  . BREAST LUMPECTOMY WITH RADIOACTIVE SEED AND SENTINEL LYMPH NODE BIOPSY Left 09/01/2018   Procedure: LEFT BREAST  RADIOACTIVE SEED LUMPECTOMY X2 AND LEFT SENTINEL LYMPH NODE BIOPSY AND MAPPING;  Surgeon: Stark Klein, MD;  Location: [place];  Service: General;  Laterality: Left;  . BREAST SURGERY Left    cyst and biopsy  . PORT-A-CATH REMOVAL Left 01/06/2019   Procedure: REMOVAL PORT-A-CATH;  Surgeon: Stark Klein, MD;  Location: [place];  Service: General;  Laterality: Left;  . PORTACATH PLACEMENT Left 03/10/2018   Procedure: INSERTION PORT-A-CATH;  Surgeon: Stark Klein, MD;  Location: [place];  Service: General;  Laterality: Left;  . RADIOACTIVE SEED GUIDED EXCISIONAL BREAST BIOPSY Right 09/01/2018   Procedure: RADIOACTIVE SEED GUIDED  EXCISIONAL RIGHT BREAST BIOPSY;  Surgeon: Stark Klein, MD;  Location: [place];  Service: General;  Laterality: Right;    There were no vitals filed for this visit.  Subjective Assessment - 05/23/19 0911    Subjective  Pt states that the foam pad seems to be working. She reports that she has not yet called and set up an appointment with second to nature becuase she was concerned about getting a bra when one side was so much bigger than the other side.    Pertinent History  chemotherapy followed by 2 lumpectomy of the L breast on 09/01/2018 with 2 lymph node removal and radiation that she finished on 11/02/2018    Patient Stated Goals  I want to get the swelling down.    Currently in Pain?  No/denies                               PT Education - 05/23/19 0957    Education Details  Pt was educated on self MLD with an emphasis on skin stretch, direction and pressure with step instruction using demonstration, hand over hand facilitation and VC for correct technique.    Person(s) Educated  Patient    Methods  Explanation;Demonstration;Verbal cues;Handout;Tactile cues    Comprehension  Verbalized understanding;Returned demonstration  PT Short Term Goals - 05/19/19 1553      PT SHORT TERM GOAL #1   Title  Pt will be independent with HEp and self MLD within 2 weeks in order to demonstrate autonomy of care.    Baseline  pt is not performing an HEP and does not know how to perform MLD    Time  2    Period  Weeks    Status  New    Target Date  06/09/19        PT Long Term Goals - 05/19/19 1554      PT LONG TERM GOAL #1   Title  Pt will have a compression bra to wear on a daily basis in order to manage swelling in the L breast.    Baseline  pt does not have a compression bra. She does have a compression sleeve.    Time  4    Period  Weeks    Status  New    Target Date  06/23/19      PT LONG TERM GOAL #2   Title  Pt will demonstrate 140  degrees L shoulder abduction within 4 weeks in order to demonstrate improved functional ROM.    Baseline  L shoulder abduction 120    Time  4    Period  Weeks    Status  New    Target Date  06/23/19      PT LONG TERM GOAL #3   Title  Pt will demonstrate softening and decrease density of tissue in the L lateral breast to demonstrate decreased fluid in order to decrease risk for infection within 4 weeks.    Baseline  pt currently has thickening of the L breast and skin due to edema in the L breast    Time  4    Period  Weeks    Status  New    Target Date  06/23/19            Plan - 05/23/19 0909    Clinical Impression Statement  Pt presents to physical therapy with continued edema in her L breast and 1x cord noted from the L incision area into the proximal medial brachium. Pt L breast reduces well with MLD; pt was taught MLD with emphasis on skin stretch, direction and pressure. MLD was performed by physical therapist for the L breast with significant reduction in fluid by end of session. Myofascial release was performed over scar tissue and into the L axilla to medial brachium with improvement in pliability of scar tissue; continues with cord by end of session. Discussed the role of compression in scar tissue break down and fluid facilitation out of the L breast; pt was responsive and will go to get a compression bra soon. Pt will benefit from continued POC at this time.    Rehab Potential  Good    PT Frequency  2x / week    PT Duration  4 weeks    PT Treatment/Interventions  Moist Heat;Therapeutic activities;Therapeutic exercise;Neuromuscular re-education;Manual techniques    PT Next Visit Plan  ask about second to nature, ask how self MLD is going, continue with MLD/myofascial release, begin stretching for the L shoulder and AA/ROM    PT Home Exercise Plan  post-op exercises/MLD    Consulted and Agree with Plan of Care  Patient       Patient will benefit from skilled therapeutic  intervention in order to improve the following deficits and impairments:  Increased  edema, Decreased range of motion, Decreased knowledge of precautions  Visit Diagnosis: Malignant neoplasm of upper-outer quadrant of left breast in female, estrogen receptor positive ([place])  Malignant neoplasm of lower-outer quadrant of left breast of female, estrogen receptor positive ([place])  Stiffness of left shoulder, not elsewhere classified  Lymphedema, not elsewhere classified  Muscle weakness (generalized)  Abnormal posture     Problem List Patient Active Problem List   Diagnosis Date Noted  . Genetic testing 03/24/2018  . Port-A-Cath in place 03/17/2018  . Family history of breast cancer   . Family history of throat cancer   . Family history of lung cancer   . Malignant neoplasm of lower-outer quadrant of left breast of female, estrogen receptor negative ([place]) 02/26/2018  . Malignant neoplasm of upper-outer quadrant of left breast in female, estrogen receptor positive ([place]) 02/26/2018  . Essential hypertension 10/23/2014  . Hyperlipidemia 10/23/2014    Ander Purpura, PT 05/23/2019, 10:05 AM  [place], [place], [address] Phone: [phone]   Fax:  [phone]  Name: Emily Chambers MRN: [id number] Date of Birth: 1950-10-31

## 2019-05-30 ENCOUNTER — Ambulatory Visit: Payer: Medicare Other | Attending: General Surgery

## 2019-05-30 ENCOUNTER — Other Ambulatory Visit: Payer: Self-pay

## 2019-05-30 DIAGNOSIS — R293 Abnormal posture: Secondary | ICD-10-CM | POA: Insufficient documentation

## 2019-05-30 DIAGNOSIS — C50412 Malignant neoplasm of upper-outer quadrant of left female breast: Secondary | ICD-10-CM | POA: Diagnosis not present

## 2019-05-30 DIAGNOSIS — I890 Lymphedema, not elsewhere classified: Secondary | ICD-10-CM

## 2019-05-30 DIAGNOSIS — M25612 Stiffness of left shoulder, not elsewhere classified: Secondary | ICD-10-CM | POA: Diagnosis present

## 2019-05-30 DIAGNOSIS — C50512 Malignant neoplasm of lower-outer quadrant of left female breast: Secondary | ICD-10-CM | POA: Insufficient documentation

## 2019-05-30 DIAGNOSIS — Z170 Estrogen receptor positive status [ER+]: Secondary | ICD-10-CM | POA: Insufficient documentation

## 2019-05-30 DIAGNOSIS — M6281 Muscle weakness (generalized): Secondary | ICD-10-CM | POA: Insufficient documentation

## 2019-05-30 NOTE — Therapy (Signed)
[place], [place], [address] Phone: [phone]   Fax:  [phone]  Physical Therapy Treatment  Patient Details  Name: Emily Chambers MRN: [id number] Date of Birth: 1951/02/16 Referring Provider (PT): Stark Klein MD   Encounter Date: 05/30/2019  PT End of Session - 05/30/19 1309    Visit Number  3    Number of Visits  9    Date for PT Re-Evaluation  06/23/19    PT Start Time  1307    PT Stop Time  1400    PT Time Calculation (min)  53 min    Activity Tolerance  Patient tolerated treatment well    Behavior During Therapy  [facility] for tasks assessed/performed       Past Medical History:  Diagnosis Date  . Cancer [facility])    left breast cancer  . Family history of breast cancer   . Family history of lung cancer   . Family history of throat cancer   . High cholesterol   . Hypertension   . Kidney stone   . Kidney stones   . UTI (lower urinary tract infection)     Past Surgical History:  Procedure Laterality Date  . BREAST EXCISIONAL BIOPSY Left 1999,2005   cysts removed  . BREAST LUMPECTOMY Left 2020  . BREAST LUMPECTOMY WITH RADIOACTIVE SEED AND SENTINEL LYMPH NODE BIOPSY Left 09/01/2018   Procedure: LEFT BREAST  RADIOACTIVE SEED LUMPECTOMY X2 AND LEFT SENTINEL LYMPH NODE BIOPSY AND MAPPING;  Surgeon: Stark Klein, MD;  Location: [place];  Service: General;  Laterality: Left;  . BREAST SURGERY Left    cyst and biopsy  . PORT-A-CATH REMOVAL Left 01/06/2019   Procedure: REMOVAL PORT-A-CATH;  Surgeon: Stark Klein, MD;  Location: [place];  Service: General;  Laterality: Left;  . PORTACATH PLACEMENT Left 03/10/2018   Procedure: INSERTION PORT-A-CATH;  Surgeon: Stark Klein, MD;  Location: [place];  Service: General;  Laterality: Left;  . RADIOACTIVE SEED GUIDED EXCISIONAL BREAST BIOPSY Right 09/01/2018   Procedure: RADIOACTIVE SEED GUIDED  EXCISIONAL RIGHT BREAST BIOPSY;  Surgeon: Stark Klein, MD;  Location: [place];  Service: General;  Laterality: Right;    There were no vitals filed for this visit.  Subjective Assessment - 05/30/19 1308    Subjective  Pt states that she is still wearing the foam pad. She states that she called [facility] and has an appointment on 06/24/2019    Pertinent History  chemotherapy followed by 2 lumpectomy of the L breast on 09/01/2018 with 2 lymph node removal and radiation that she finished on 11/02/2018    Patient Stated Goals  I want to get the swelling down.    Currently in Pain?  No/denies                       [facility] Adult PT Treatment/Exercise - 05/30/19 0001      Manual Therapy   Manual Therapy  Manual Lymphatic Drainage (MLD);Myofascial release;Edema management    Edema Management  1/2 inch gray foam with small nubbed pieces given to pt this session for increased compression at the seroma.     Myofascial Release  Myofascial release along the L anterior chest wall and lateral chest wall into axilla. Longitudinal myofascial release in the medial L brachium down to incision with cording noted; improved ROM following treatment session continues with cording.     Manual Lymphatic  Drainage (MLD)  In supine: short neck, swimming in the terminus, bil axillary and inguinal nodes, anterior inter-axillary anastomosis, L axillo-inguinal anastomosis, superior breast toward anastomosis then inferior breast toward anastomosis then re-worked anastomosis. Deep abdominals             PT Education - 05/30/19 1408    Education Details  Pt will continue wtih self MLDand post op ROM at home.    Person(s) Educated  Patient    Methods  Explanation    Comprehension  Verbalized understanding       PT Short Term Goals - 05/19/19 1553      PT SHORT TERM GOAL #1   Title  Pt will be independent with HEp and self MLD within 2 weeks in order to demonstrate autonomy of  care.    Baseline  pt is not performing an HEP and does not know how to perform MLD    Time  2    Period  Weeks    Status  New    Target Date  06/09/19        PT Long Term Goals - 05/19/19 1554      PT LONG TERM GOAL #1   Title  Pt will have a compression bra to wear on a daily basis in order to manage swelling in the L breast.    Baseline  pt does not have a compression bra. She does have a compression sleeve.    Time  4    Period  Weeks    Status  New    Target Date  06/23/19      PT LONG TERM GOAL #2   Title  Pt will demonstrate 140 degrees L shoulder abduction within 4 weeks in order to demonstrate improved functional ROM.    Baseline  L shoulder abduction 120    Time  4    Period  Weeks    Status  New    Target Date  06/23/19      PT LONG TERM GOAL #3   Title  Pt will demonstrate softening and decrease density of tissue in the L lateral breast to demonstrate decreased fluid in order to decrease risk for infection within 4 weeks.    Baseline  pt currently has thickening of the L breast and skin due to edema in the L breast    Time  4    Period  Weeks    Status  New    Target Date  06/23/19            Plan - 05/30/19 1309    Clinical Impression Statement  Pt presents to physical therapy wtih continued edema in her L breast that has had significant softening in the medial inferior quadrant of the breast but slight increase in superior quadrants with soft edema. Myofascial release was performed along the L lateral trunk and anterior trunk with significant loosening and improved ROM; more cords noted following myofascial release that were then worked on most likely due to cords seperating from fascia. Pt was provided with new foam pad with dense orange small nubbed foam to wear. MLD was performed to the L breast with continued improvement. Pt will benefit from continued POC at this time.    Rehab Potential  Good    PT Frequency  2x / week    PT Duration  4 weeks    PT  Treatment/Interventions  Moist Heat;Therapeutic activities;Therapeutic exercise;Neuromuscular re-education;Manual techniques    PT Next Visit Plan  scapular series, ask how self MLD is going, continue with MLD/myofascial release, begin stretching for the L shoulder and AA/ROM    PT Home Exercise Plan  post-op exercises/MLD    Recommended Other Services  appt with [facility] on 06/24/2019    Consulted and Agree with Plan of Care  Patient       Patient will benefit from skilled therapeutic intervention in order to improve the following deficits and impairments:  Increased edema, Decreased range of motion, Decreased knowledge of precautions  Visit Diagnosis: Malignant neoplasm of upper-outer quadrant of left breast in female, estrogen receptor positive ([place])  Malignant neoplasm of lower-outer quadrant of left breast of female, estrogen receptor positive ([place])  Stiffness of left shoulder, not elsewhere classified  Lymphedema, not elsewhere classified  Muscle weakness (generalized)  Abnormal posture     Problem List Patient Active Problem List   Diagnosis Date Noted  . Genetic testing 03/24/2018  . Port-A-Cath in place 03/17/2018  . Family history of breast cancer   . Family history of throat cancer   . Family history of lung cancer   . Malignant neoplasm of lower-outer quadrant of left breast of female, estrogen receptor negative ([place]) 02/26/2018  . Malignant neoplasm of upper-outer quadrant of left breast in female, estrogen receptor positive ([place]) 02/26/2018  . Essential hypertension 10/23/2014  . Hyperlipidemia 10/23/2014    Ander Purpura, PT 05/30/2019, 2:13 PM  [place], [place], [address] Phone: [phone]   Fax:  [phone]  Name: Emily Chambers MRN: [id number] Date of Birth: August 27, 1950

## 2019-06-02 ENCOUNTER — Other Ambulatory Visit: Payer: Self-pay

## 2019-06-02 ENCOUNTER — Ambulatory Visit
Admission: RE | Admit: 2019-06-02 | Discharge: 2019-06-02 | Disposition: A | Discharge status: Home / Self Care | Payer: Medicare Other | Source: Ambulatory Visit | Attending: General Surgery | Admitting: General Surgery

## 2019-06-02 DIAGNOSIS — C50812 Malignant neoplasm of overlapping sites of left female breast: Secondary | ICD-10-CM

## 2019-06-06 ENCOUNTER — Ambulatory Visit: Payer: Medicare Other

## 2019-06-06 ENCOUNTER — Other Ambulatory Visit: Payer: Self-pay

## 2019-06-06 DIAGNOSIS — M6281 Muscle weakness (generalized): Secondary | ICD-10-CM

## 2019-06-06 DIAGNOSIS — M25612 Stiffness of left shoulder, not elsewhere classified: Secondary | ICD-10-CM

## 2019-06-06 DIAGNOSIS — C50412 Malignant neoplasm of upper-outer quadrant of left female breast: Secondary | ICD-10-CM | POA: Diagnosis not present

## 2019-06-06 DIAGNOSIS — I890 Lymphedema, not elsewhere classified: Secondary | ICD-10-CM

## 2019-06-06 DIAGNOSIS — C50512 Malignant neoplasm of lower-outer quadrant of left female breast: Secondary | ICD-10-CM

## 2019-06-06 DIAGNOSIS — R293 Abnormal posture: Secondary | ICD-10-CM

## 2019-06-06 DIAGNOSIS — Z170 Estrogen receptor positive status [ER+]: Secondary | ICD-10-CM

## 2019-06-06 NOTE — Therapy (Signed)
[place], [place], [address] Phone: [phone]   Fax:  [phone]  Physical Therapy Treatment  Patient Details  Name: Emily Chambers MRN: [id number] Date of Birth: 01-10-1951 Referring Provider (PT): Stark Klein MD   Encounter Date: 06/06/2019  PT End of Session - 06/06/19 1408    Visit Number  4    Number of Visits  9    Date for PT Re-Evaluation  06/23/19    PT Start Time  9211    PT Stop Time  1500    PT Time Calculation (min)  55 min    Activity Tolerance  Patient tolerated treatment well    Behavior During Therapy  [facility] for tasks assessed/performed       Past Medical History:  Diagnosis Date  . Cancer [facility])    left breast cancer  . Family history of breast cancer   . Family history of lung cancer   . Family history of throat cancer   . High cholesterol   . Hypertension   . Kidney stone   . Kidney stones   . UTI (lower urinary tract infection)     Past Surgical History:  Procedure Laterality Date  . BREAST EXCISIONAL BIOPSY Left 1999,2005   cysts removed  . BREAST LUMPECTOMY Left 2020  . BREAST LUMPECTOMY WITH RADIOACTIVE SEED AND SENTINEL LYMPH NODE BIOPSY Left 09/01/2018   Procedure: LEFT BREAST  RADIOACTIVE SEED LUMPECTOMY X2 AND LEFT SENTINEL LYMPH NODE BIOPSY AND MAPPING;  Surgeon: Stark Klein, MD;  Location: [place];  Service: General;  Laterality: Left;  . BREAST SURGERY Left    cyst and biopsy  . PORT-A-CATH REMOVAL Left 01/06/2019   Procedure: REMOVAL PORT-A-CATH;  Surgeon: Stark Klein, MD;  Location: [place];  Service: General;  Laterality: Left;  . PORTACATH PLACEMENT Left 03/10/2018   Procedure: INSERTION PORT-A-CATH;  Surgeon: Stark Klein, MD;  Location: [place];  Service: General;  Laterality: Left;  . RADIOACTIVE SEED GUIDED EXCISIONAL BREAST BIOPSY Right 09/01/2018   Procedure: RADIOACTIVE SEED GUIDED  EXCISIONAL RIGHT BREAST BIOPSY;  Surgeon: Stark Klein, MD;  Location: [place];  Service: General;  Laterality: Right;    There were no vitals filed for this visit.  Subjective Assessment - 06/06/19 1408    Subjective  Pt states that she had her US done and was told there is fluid but not enough to drain. She states that she is feeling better and thinks that her swelling is much better.    Pertinent History  chemotherapy followed by 2 lumpectomy of the L breast on 09/01/2018 with 2 lymph node removal and radiation that she finished on 11/02/2018    Patient Stated Goals  I want to get the swelling down.    Currently in Pain?  No/denies    Multiple Pain Sites  No                       [facility] Adult PT Treatment/Exercise - 06/06/19 0001      Exercises   Exercises  Shoulder      Shoulder Exercises: Supine   Horizontal ABduction  Strengthening;Both;10 reps;Theraband    Theraband Level (Shoulder Horizontal ABduction)  Level 1 (Yellow)    Horizontal ABduction Limitations  Demonstration for correct movement, VC for slow movement in order t oprevent compensation w/momentum.     External Rotation  Strengthening;Both;10 reps;Theraband    Theraband Level (Shoulder  External Rotation)  Level 1 (Yellow)    External Rotation Limitations  VC for maintianing elbows adduction and slow movement to prevent compensation w/momentum.     Flexion  Strengthening;10 reps;Theraband    Theraband Level (Shoulder Flexion)  Level 1 (Yellow)    Flexion Limitations  VC for maintaining tension in the band throughout movement; pt has greater flexion in her L shoulder than her R.     Diagonals  Strengthening;Left;10 reps;Theraband    Theraband Level (Shoulder Diagonals)  Level 1 (Yellow)    Diagonals Limitations  Tactile cueing for correct movement and VC to decrease speed in order to prevent compensation w/momentum.       Shoulder Exercises: Sidelying   ABduction  Left;10 reps;AROM     ABduction Limitations  into end range with end range stretch 10x VC to avoid pain.       Manual Therapy   Manual Therapy  Manual Lymphatic Drainage (MLD);Myofascial release;Soft tissue mobilization;Passive ROM    Soft tissue mobilization  STM to the serratus anterior finger projections onto the ribs and into the latissimus dorsi to decrease scapular mobility early onset mobility; minimal improvement by end of session    Myofascial Release  Myofascial release along the L anterior chest wall and lateral chest wall into axilla. Longitudinal myofascial release in the medial L brachium down to incision with cording noted; improved ROM continues from last session minimal to no improvement following session today     Manual Lymphatic Drainage (MLD)  In supine: short neck, swimming in the terminus, bil axillary and inguinal nodes, anterior inter-axillary anastomosis, L axillo-inguinal anastomosis, superior breast toward anastomosis then inferior breast toward anastomosis then re-worked anastomosis. re-worked anastomosis following STM and myofascial release.     Passive ROM  PROM into flexion and abduction with end range stretch and scapular stabilization. 3x each direction             PT Education - 06/06/19 1457    Education Details  Pt will perform scaplar series and will continue wearing her bra with 1/2 inch gray form and dense orange foam. She will continue performing self MLD.    Person(s) Educated  Patient    Methods  Explanation;Demonstration;Tactile cues;Verbal cues;Handout    Comprehension  Verbalized understanding;Returned demonstration       PT Short Term Goals - 05/19/19 1553      PT SHORT TERM GOAL #1   Title  Pt will be independent with HEp and self MLD within 2 weeks in order to demonstrate autonomy of care.    Baseline  pt is not performing an HEP and does not know how to perform MLD    Time  2    Period  Weeks    Status  New    Target Date  06/09/19        PT Long Term  Goals - 05/19/19 1554      PT LONG TERM GOAL #1   Title  Pt will have a compression bra to wear on a daily basis in order to manage swelling in the L breast.    Baseline  pt does not have a compression bra. She does have a compression sleeve.    Time  4    Period  Weeks    Status  New    Target Date  06/23/19      PT LONG TERM GOAL #2   Title  Pt will demonstrate 140 degrees L shoulder abduction within 4 weeks in order to  demonstrate improved functional ROM.    Baseline  L shoulder abduction 120    Time  4    Period  Weeks    Status  New    Target Date  06/23/19      PT LONG TERM GOAL #3   Title  Pt will demonstrate softening and decrease density of tissue in the L lateral breast to demonstrate decreased fluid in order to decrease risk for infection within 4 weeks.    Baseline  pt currently has thickening of the L breast and skin due to edema in the L breast    Time  4    Period  Weeks    Status  New    Target Date  06/23/19            Plan - 06/06/19 1408    Clinical Impression Statement  Pt presents to physical therapy with continued edema in her L breast especially in both superior quadrants. She has an appointment [facility] on 06/24/19 to get measured for a compression bra and discussed the different compression bras available. Myofascial release was performed on the L lateral trunk wall with significant improvement continued from her last session; she continues with cording her her L axilla and into her L brachium. She has continud improved ROM. Pt has muscle tightness in the serratus anterior that is pulling her scapula forward with flexion and abduction; pt reports decreased pressure wtih stabilization of the scapula. MLD was performed for the L breast prior to STM/myofascial release and then re-worked anastomosis following. Pt performed scapular series with A/ROM exercises added to exercise sheet in order to address mobilty deficits.    Rehab Potential  Good    PT  Frequency  2x / week    PT Duration  4 weeks    PT Treatment/Interventions  Moist Heat;Therapeutic activities;Therapeutic exercise;Neuromuscular re-education;Manual techniques    PT Next Visit Plan  ask how scapular series and exercises are going, ask how self MLD is going, continue with MLD/myofascial release, begin stretching for the L shoulder and A/ROM    PT Home Exercise Plan  post-op exercises/MLD    Consulted and Agree with Plan of Care  Patient       Patient will benefit from skilled therapeutic intervention in order to improve the following deficits and impairments:  Increased edema, Decreased range of motion, Decreased knowledge of precautions  Visit Diagnosis: Malignant neoplasm of upper-outer quadrant of left breast in female, estrogen receptor positive ([place])  Malignant neoplasm of lower-outer quadrant of left breast of female, estrogen receptor positive ([facility])  Stiffness of left shoulder, not elsewhere classified  Lymphedema, not elsewhere classified  Muscle weakness (generalized)  Abnormal posture     Problem List Patient Active Problem List   Diagnosis Date Noted  . Genetic testing 03/24/2018  . Port-A-Cath in place 03/17/2018  . Family history of breast cancer   . Family history of throat cancer   . Family history of lung cancer   . Malignant neoplasm of lower-outer quadrant of left breast of female, estrogen receptor negative ([place]) 02/26/2018  . Malignant neoplasm of upper-outer quadrant of left breast in female, estrogen receptor positive ([place]) 02/26/2018  . Essential hypertension 10/23/2014  . Hyperlipidemia 10/23/2014    Ander Purpura, PT 06/06/2019, 3:05 PM  [place], [place], [address] Phone: [phone]   Fax:  [phone]  Name: Emily Chambers MRN: [id number] Date of Birth: [birth year]

## 2019-06-06 NOTE — Patient Instructions (Signed)
Over Head Pull: Narrow and Wide Grip   Cancer Rehab [phone]   On back, knees bent, feet flat, band across thighs, elbows straight but relaxed. Pull hands apart (start). Keeping elbows straight, bring arms up and over head, hands toward floor. Keep pull steady on band. Hold momentarily. Return slowly, keeping pull steady, back to start. Then do same with a wider grip on the band (past shoulder width) Repeat _10__ times. Band color __yellow____   Side Pull: Double Arm   On back, knees bent, feet flat. Arms perpendicular to body, shoulder level, elbows straight but relaxed. Pull arms out to sides, elbows straight. Resistance band comes across collarbones, hands toward floor. Hold momentarily. Slowly return to starting position. Repeat _10__ times. Band color _yellow____   Sword   On back, knees bent, feet flat, left hand on left hip, right hand above left. Pull right arm DIAGONALLY (hip to shoulder) across chest. Bring right arm along head toward floor. Hold momentarily. Slowly return to starting position. Repeat _10__ times. Do with left arm. Band color _yellow_____   Shoulder Rotation: Double Arm   On back, knees bent, feet flat, elbows tucked at sides, bent 90, hands palms up. Pull hands apart and down toward floor, keeping elbows near sides. Hold momentarily. Slowly return to starting position. Repeat _10__ times. Band color __yellow____   Abduction (Eccentric) - Side-Lying    Lie on side, affected arm on top. Lift arm overhead. Slowly lower for 3-5 seconds. __10_ reps per set, _1__ sets per day, __6_ days per week.   Abduction: Horizontal - Prone (Dumbbell)    Lie on your side with your arms out straight like your an alligator. Lift L arm up and squeeze your shoulder blade. The movement is like the picture except you are on your side not on your stomach and you will not have a weight. . Repeat _10___ times per set. Do __1__ sets per session. Do _6___ sessions per  week.  Copyright  VHI. All rights reserved.    http://ecce.exer.us/167   Copyright  VHI. All rights reserved.

## 2019-06-13 ENCOUNTER — Other Ambulatory Visit: Payer: Self-pay

## 2019-06-13 ENCOUNTER — Ambulatory Visit: Payer: Medicare Other

## 2019-06-13 DIAGNOSIS — Z170 Estrogen receptor positive status [ER+]: Secondary | ICD-10-CM

## 2019-06-13 DIAGNOSIS — R293 Abnormal posture: Secondary | ICD-10-CM

## 2019-06-13 DIAGNOSIS — I890 Lymphedema, not elsewhere classified: Secondary | ICD-10-CM

## 2019-06-13 DIAGNOSIS — C50412 Malignant neoplasm of upper-outer quadrant of left female breast: Secondary | ICD-10-CM

## 2019-06-13 DIAGNOSIS — M25612 Stiffness of left shoulder, not elsewhere classified: Secondary | ICD-10-CM

## 2019-06-13 DIAGNOSIS — C50512 Malignant neoplasm of lower-outer quadrant of left female breast: Secondary | ICD-10-CM

## 2019-06-13 DIAGNOSIS — M6281 Muscle weakness (generalized): Secondary | ICD-10-CM

## 2019-06-13 NOTE — Patient Instructions (Signed)
Access Code: [email or web address] URL: [email or web address] Date: 06/13/2019 Prepared by: Tomma Rakers  Exercises Shoulder Flexion Wall Slide with Towel - 1 x daily - 7 x weekly - 1 sets - 10 reps - 5 seconds hold Standing Shoulder Abduction Slides at Wall - 1 x daily - 7 x weekly - 1 sets - 10 reps - 5 seconds hold Doorway Pec Stretch at 60 Elevation - 1 x daily - 7 x weekly - 1 sets - 2 reps - 20 seconds hold Standing Row with Resistance - 1 x daily - 7 x weekly - 1 sets - 10 reps Shoulder Extension with Resistance - 1 x daily - 7 x weekly - 1 sets - 10 reps Shoulder External Rotation and Scapular Retraction with Resistance - 1 x daily - 7 x weekly - 1 sets - 10 reps

## 2019-06-13 NOTE — Therapy (Addendum)
[place], [place], [address] Phone: [phone]   Fax:  [phone]  Physical Therapy Treatment  Patient Details  Name: Emily Chambers MRN: [id number] Date of Birth: 01-Jul-1950 Referring Provider (PT): Stark Klein MD   Encounter Date: 06/13/2019  PT End of Session - 06/13/19 1401    Visit Number  5    Number of Visits  9    Date for PT Re-Evaluation  06/23/19    PT Start Time  1401    PT Stop Time  1455    PT Time Calculation (min)  54 min    Activity Tolerance  Patient tolerated treatment well    Behavior During Therapy  [facility] for tasks assessed/performed       Past Medical History:  Diagnosis Date  . Cancer [facility])    left breast cancer  . Family history of breast cancer   . Family history of lung cancer   . Family history of throat cancer   . High cholesterol   . Hypertension   . Kidney stone   . Kidney stones   . UTI (lower urinary tract infection)     Past Surgical History:  Procedure Laterality Date  . BREAST EXCISIONAL BIOPSY Left 1999,2005   cysts removed  . BREAST LUMPECTOMY Left 2020  . BREAST LUMPECTOMY WITH RADIOACTIVE SEED AND SENTINEL LYMPH NODE BIOPSY Left 09/01/2018   Procedure: LEFT BREAST  RADIOACTIVE SEED LUMPECTOMY X2 AND LEFT SENTINEL LYMPH NODE BIOPSY AND MAPPING;  Surgeon: Stark Klein, MD;  Location: [place];  Service: General;  Laterality: Left;  . BREAST SURGERY Left    cyst and biopsy  . PORT-A-CATH REMOVAL Left 01/06/2019   Procedure: REMOVAL PORT-A-CATH;  Surgeon: Stark Klein, MD;  Location: [place];  Service: General;  Laterality: Left;  . PORTACATH PLACEMENT Left 03/10/2018   Procedure: INSERTION PORT-A-CATH;  Surgeon: Stark Klein, MD;  Location: [place];  Service: General;  Laterality: Left;  . RADIOACTIVE SEED GUIDED EXCISIONAL BREAST BIOPSY Right 09/01/2018   Procedure: RADIOACTIVE SEED GUIDED  EXCISIONAL RIGHT BREAST BIOPSY;  Surgeon: Stark Klein, MD;  Location: [place];  Service: General;  Laterality: Right;    There were no vitals filed for this visit.  Subjective Assessment - 06/13/19 1401    Subjective  Pt states that she is doing pretty good. She is not doing her exercises every night but is doing them occasionally and she feels it working her arm.    Pertinent History  chemotherapy followed by 2 lumpectomy of the L breast on 09/01/2018 with 2 lymph node removal and radiation that she finished on 11/02/2018    Patient Stated Goals  I want to get the swelling down.    Currently in Pain?  No/denies    Multiple Pain Sites  No                       [facility] Adult PT Treatment/Exercise - 06/13/19 0001      Shoulder Exercises: Supine   Other Supine Exercises  --      Shoulder Exercises: Seated   External Rotation  Strengthening;Both;10 reps    Theraband Level (Shoulder External Rotation)  Level 2 (Red)    External Rotation Limitations  demonstration for correct movement w/o resistance initially, VC to squeeze scapula, keep elbows tucked and to contract core for correct movement.       Shoulder Exercises: Standing  Extension  Strengthening;Both;10 reps    Theraband Level (Shoulder Extension)  Level 2 (Red)    Extension Limitations  Demonstration and VC that this is targeting the lower trapezius rather than middle trap, VC for contracting scapula to address correct musculature. Intermittent VC to prevent bil shoulder hiking.     Row  Strengthening;Both;10 reps    Theraband Level (Shoulder Row)  Level 2 (Red)    Row Limitations  VC to maintain core contraction, one foot in front and demonstration with VC intermittently to keep shoulders down/back and perform scapular squeeze.     Other Standing Exercises  AA/ROM stretching into flexion w/bil UE then unilateral abduction on the wall with tactile cueing with abduction in order to prevent compensation  into scaption. 10x 5 seconds with VC to avoid pain and feel stretching at end range.     Other Standing Exercises  pec stretch at doorway 2x 20 seconds with demonstration and VC for correct form to prevent activation pectoralis and prevent pure stretch.       Manual Therapy   Manual Therapy  Manual Lymphatic Drainage (MLD);Myofascial release    Myofascial Release  Myofascial release along the anterior and lateral L chest wall in order to decrease fascial adhesions noted in the L breast especially in the inferior and lateral aspects.     Manual Lymphatic Drainage (MLD)  In supine: short neck, swimming in the terminus, bil axillary and inguinal nodes, anterior inter-axillary anastomosis, L axillo-inguinal anastomosis, superior breast toward anastomosis then inferior breast toward anastomosis then re-worked anastomosis. extra time spent on the L breast this session.              PT Education - 06/13/19 1430    Education Details  Access Code: [email or web address], pt was taken throught scapular/postural strengthening HEP this session w/VC, tactile cueing and demonstration. Pt had good form with repetitions following cueing.    Person(s) Educated  Patient    Methods  Explanation;Demonstration;Tactile cues;Verbal cues;Handout    Comprehension  Verbalized understanding       PT Short Term Goals - 05/19/19 1553      PT SHORT TERM GOAL #1   Title  Pt will be independent with HEp and self MLD within 2 weeks in order to demonstrate autonomy of care.    Baseline  pt is not performing an HEP and does not know how to perform MLD    Time  2    Period  Weeks    Status  New    Target Date  06/09/19        PT Long Term Goals - 05/19/19 1554      PT LONG TERM GOAL #1   Title  Pt will have a compression bra to wear on a daily basis in order to manage swelling in the L breast.    Baseline  pt does not have a compression bra. She does have a compression sleeve.    Time  4    Period  Weeks    Status  New     Target Date  06/23/19      PT LONG TERM GOAL #2   Title  Pt will demonstrate 140 degrees L shoulder abduction within 4 weeks in order to demonstrate improved functional ROM.    Baseline  L shoulder abduction 120    Time  4    Period  Weeks    Status  New    Target Date  06/23/19  PT LONG TERM GOAL #3   Title  Pt will demonstrate softening and decrease density of tissue in the L lateral breast to demonstrate decreased fluid in order to decrease risk for infection within 4 weeks.    Baseline  pt currently has thickening of the L breast and skin due to edema in the L breast    Time  4    Period  Weeks    Status  New    Target Date  06/23/19            Plan - 06/13/19 1400    Clinical Impression Statement  Pt exercises were updated this session with increased postural/scapular stabilization and shoulder stretching at end range. Myofascil release was performed along the anterior and lateral chest wall on the L due to continues adhesions noted in the L breast; minimal improvement by end of session. MLD was performed for the L breast following myofascial release with extra time spent at the L breast. Pt will benefit from continued POC at this time.    Rehab Potential  Good    PT Frequency  2x / week    PT Duration  4 weeks    PT Treatment/Interventions  Moist Heat;Therapeutic activities;Therapeutic exercise;Neuromuscular re-education;Manual techniques    PT Next Visit Plan  ask how scapular series and exercises are going, ask how self MLD is going, continue with MLD/myofascial release, begin stretching for the L shoulder and A/ROM    PT Home Exercise Plan  post-op exercises/MLD    Consulted and Agree with Plan of Care  Patient       Patient will benefit from skilled therapeutic intervention in order to improve the following deficits and impairments:  Increased edema, Decreased range of motion, Decreased knowledge of precautions  Visit Diagnosis: Malignant neoplasm of  upper-outer quadrant of left breast in female, estrogen receptor positive ([place])  Malignant neoplasm of lower-outer quadrant of left breast of female, estrogen receptor positive ([facility])  Stiffness of left shoulder, not elsewhere classified  Lymphedema, not elsewhere classified  Muscle weakness (generalized)  Abnormal posture     Problem List Patient Active Problem List   Diagnosis Date Noted  . Genetic testing 03/24/2018  . Port-A-Cath in place 03/17/2018  . Family history of breast cancer   . Family history of throat cancer   . Family history of lung cancer   . Malignant neoplasm of lower-outer quadrant of left breast of female, estrogen receptor negative ([place]) 02/26/2018  . Malignant neoplasm of upper-outer quadrant of left breast in female, estrogen receptor positive ([place]) 02/26/2018  . Essential hypertension 10/23/2014  . Hyperlipidemia 10/23/2014   PHYSICAL THERAPY DISCHARGE SUMMARY  Plan:                                                    Patient goals were not met. Patient is being discharged due to not returning since the last visit.  ?????       Ander Purpura, PT 06/13/2019, 3:03 PM  [place], [place], [address] Phone: [phone]   Fax:  [phone]  Name: Emily Chambers MRN: [id number] Date of Birth: 03-Nov-1950

## 2019-07-01 ENCOUNTER — Ambulatory Visit
Admission: RE | Admit: 2019-07-01 | Discharge: 2019-07-01 | Disposition: A | Discharge status: Home / Self Care | Payer: Medicare Other | Source: Ambulatory Visit | Attending: Adult Health | Admitting: Adult Health

## 2019-07-01 ENCOUNTER — Other Ambulatory Visit: Payer: Self-pay

## 2019-07-01 DIAGNOSIS — E2839 Other primary ovarian failure: Secondary | ICD-10-CM

## 2019-07-07 ENCOUNTER — Telehealth: Payer: Self-pay

## 2019-07-07 NOTE — Telephone Encounter (Signed)
-----   Message from Gardenia Phlegm, NP sent at 07/04/2019  8:05 AM EDT ----- Bone density is normal.  Please notify patient.   ----- Message ----- From: Interface, Rad Results In Sent: 07/01/2019   8:16 PM EDT To: Gardenia Phlegm, NP

## 2019-07-07 NOTE — Telephone Encounter (Signed)
TC to Pt to relay normal bone density results. V/m left for Pt. To return call to cancer center if she had any questions.

## 2019-09-05 ENCOUNTER — Telehealth: Payer: Self-pay | Admitting: *Deleted

## 2019-09-05 NOTE — Telephone Encounter (Signed)
Patient called to see if she needs to have mammogram prior to her next visit in July.

## 2019-09-06 IMAGING — DX BREAST SURGICAL SPECIMEN
1 series · 2 of 2 positions shown · non-contrast
Comparison: Previous exam(s).

CLINICAL DATA: Right breast papilloma.

EXAM:
SPECIMEN RADIOGRAPH OF THE RIGHT BREAST

[Series 2: specimen digital x-ray, derived · right · 2 of 2 slices shown]
[im 1/2]
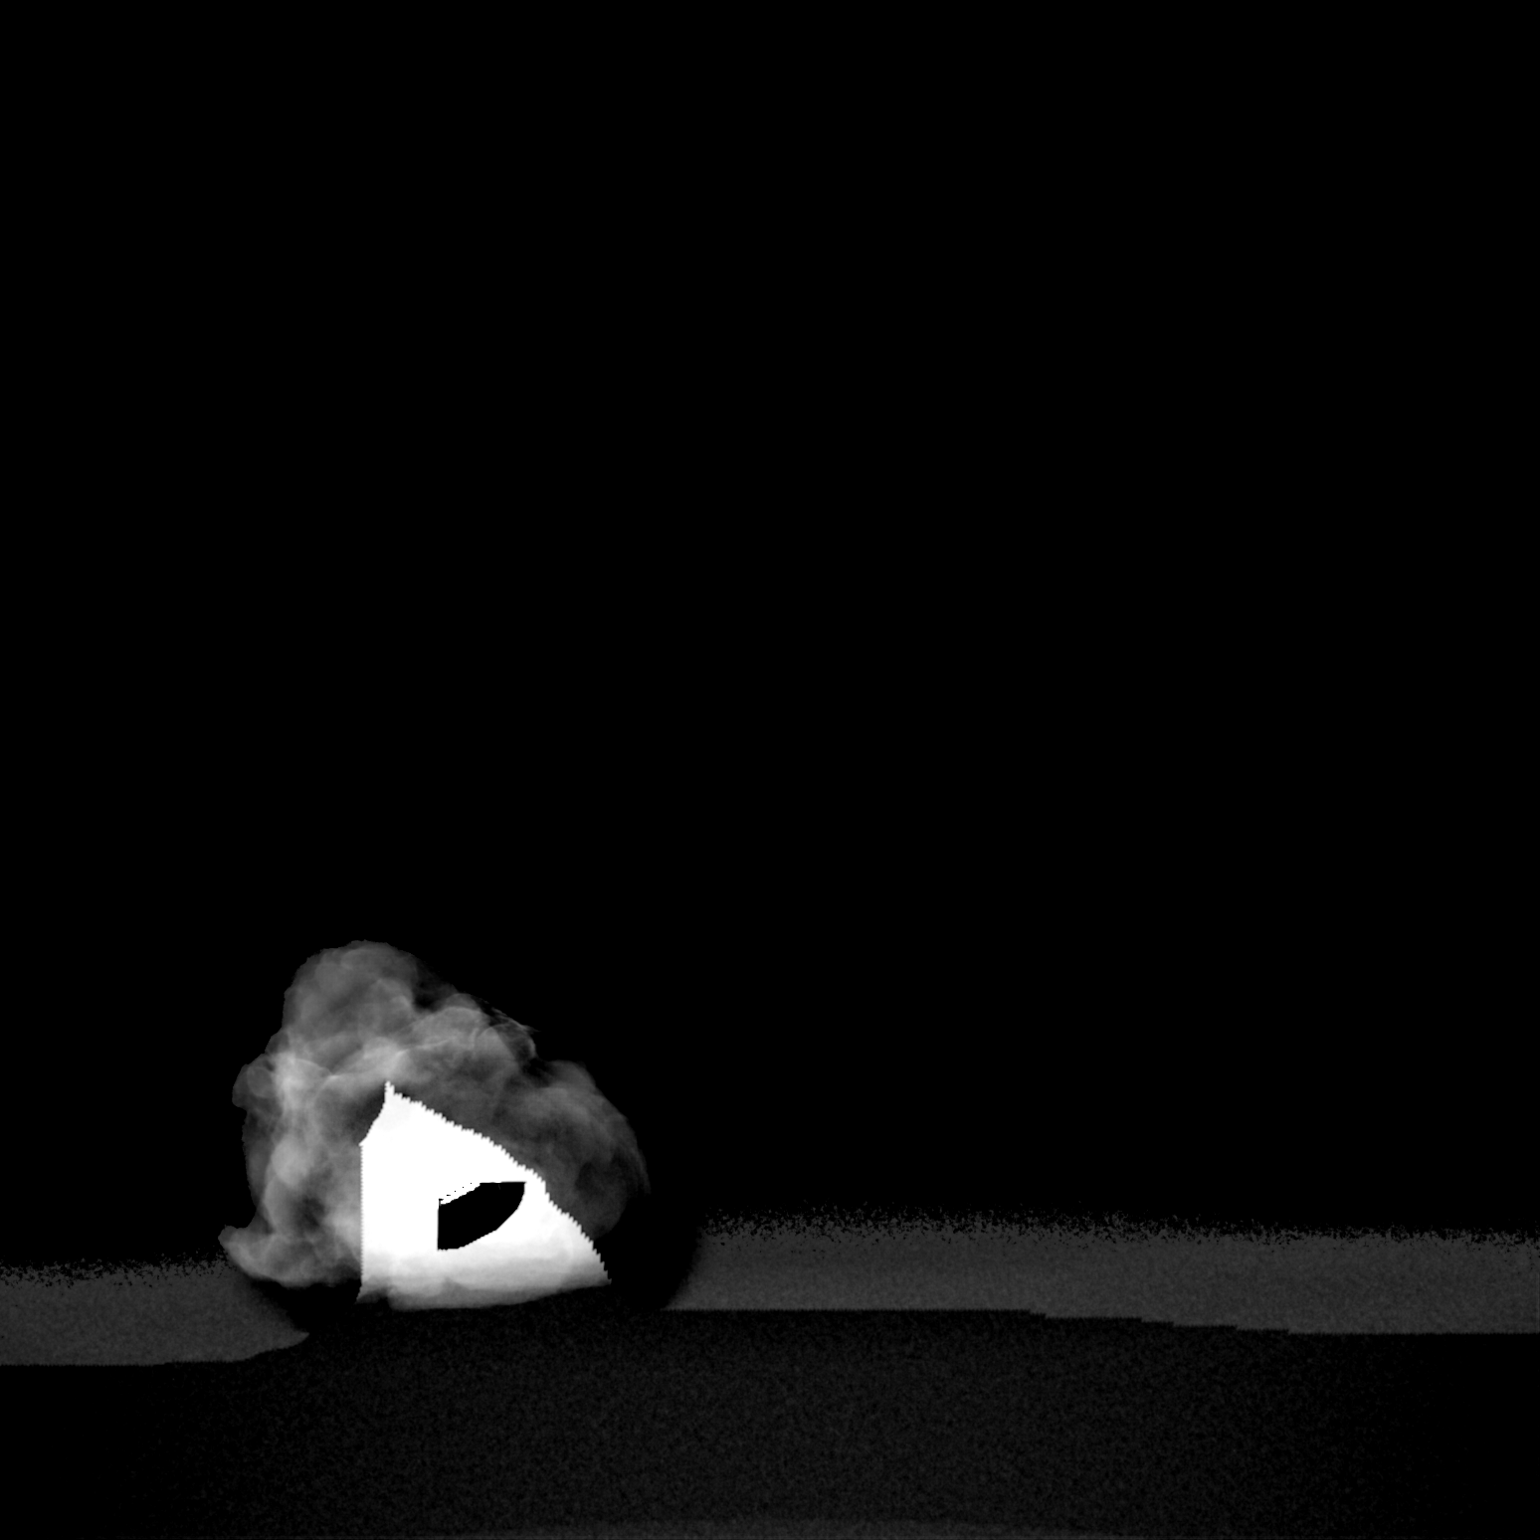
[im 2/2]
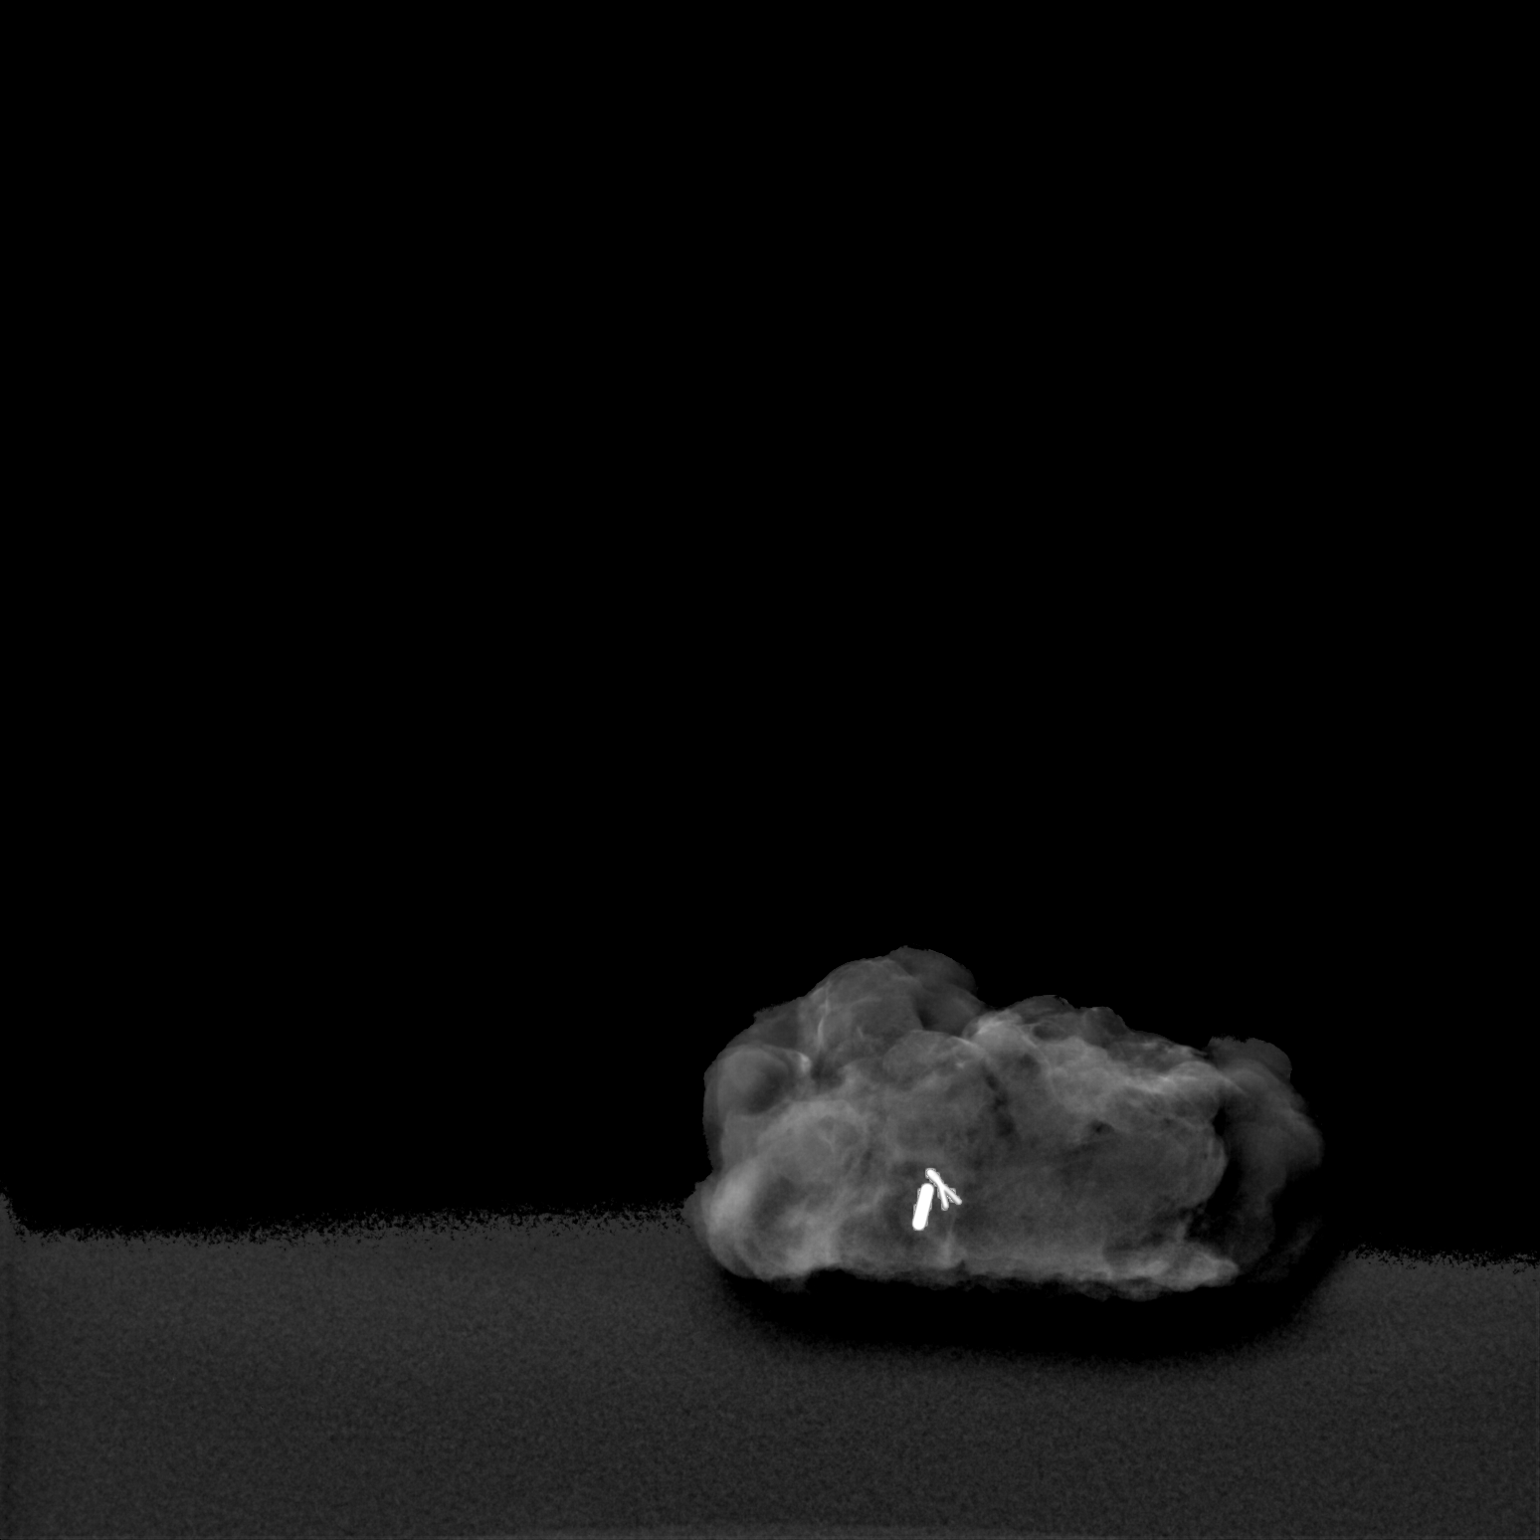

[2 of 2 positions shown; findings below may reference images not displayed]

FINDINGS: Status post excision of the right breast. The radioactive seed and
biopsy marker clip are present, completely intact, and were marked
for pathology.
IMPRESSION: Specimen radiograph of the right breast.

## 2019-09-08 ENCOUNTER — Telehealth: Payer: Self-pay

## 2019-09-08 NOTE — Telephone Encounter (Signed)
Received call from pt stating she would like to r/s appt she currently has for 7/19. Message sent to scheduling to fulfill this.

## 2019-09-10 ENCOUNTER — Other Ambulatory Visit: Payer: Self-pay | Admitting: Oncology

## 2019-09-12 ENCOUNTER — Inpatient Hospital Stay: Payer: Medicare Other | Admitting: Oncology

## 2019-09-12 ENCOUNTER — Inpatient Hospital Stay: Payer: Medicare Other

## 2019-09-18 NOTE — Progress Notes (Signed)
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   She is taking Anastrozole daily.  Her most recent bone density was completed on 07/01/2019 and showed normal bone density.  She is due for her annual mammogram.    Ashliegh denies any issues with taking the anastrozole such as hot flashes, arthralgias, or vaginal dryness.     REVIEW OF SYSTEMS:  Emily Chambers is seeing her PCP regularly.  She is up to date with her other cancer screenings.  She is walking once or twice per week.  She has noted some fluid in her knee.  She denies any current aching or other concerns.   Emily Chambers is without fever, chills, cough, chest pian, palpitations, shortness of breath, bowel/bladder issues, nausea, vomiting, headaches, or vision issues.  A detailed ROS is otherwise non contributory.     HISTORY OF CURRENT ILLNESS: From the original intake note:  Emily Chambers had routine screening mammography on 02/10/2018 showing a possible abnormality in the left breast. She underwent bilateral diagnostic mammography with tomography and left breast ultrasonography at [facility] on 02/16/2018 showing: breast density category C; two left breast masses, one at 2 o'clock and the other at 3:30 o'clock. The 2 o'clock mass (1.3 x 1 x  1 cm) corresponds to the mammographic abnormality and is highly suspicious for breast carcinoma. The other mass (1.2 x 0.7 x 1.2 cm)  is suspicious for breast carcinoma. No left axillary adenopathy.   Accordingly on 02/19/2018 she proceeded to biopsy of the left breast area in question. 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 Prognostic indicators significant for: estrogen receptor, 90% positive and progesterone receptor, 95% positive, both with strong staining intensity. Proliferation marker Ki67 at 5%. HER2 negative (1+).   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 Prognostic indicators significant for: estrogen receptor, 10% positive with moderate staining intensity and progesterone receptor, 0% negative. Proliferation marker Ki67 at 40%. HER2 negative (1+).   The patient's subsequent history is as detailed below.   PAST MEDICAL HISTORY: Past Medical History:  Diagnosis Date  . Cancer [facility])    left breast cancer  . Family history of breast cancer   . Family history of lung cancer   . Family history of throat cancer   . High cholesterol   . Hypertension   . Kidney stone   . Kidney stones   . UTI (lower urinary tract infection)     PAST SURGICAL HISTORY: Past Surgical History:  Procedure Laterality Date  . BREAST EXCISIONAL BIOPSY Left 1999,2005   cysts removed  . BREAST LUMPECTOMY Left 2020  . BREAST LUMPECTOMY WITH RADIOACTIVE SEED AND SENTINEL LYMPH NODE BIOPSY Left 09/01/2018   Procedure: LEFT BREAST  RADIOACTIVE SEED LUMPECTOMY X2 AND LEFT SENTINEL LYMPH NODE BIOPSY AND MAPPING;  Surgeon: Stark Klein,  MD;  Location: [place];  Service: General;  Laterality: Left;  . BREAST SURGERY Left    cyst and biopsy  . PORT-A-CATH REMOVAL Left 01/06/2019   Procedure: REMOVAL PORT-A-CATH;  Surgeon: Stark Klein, MD;  Location: [place];  Service: General;  Laterality: Left;  . PORTACATH PLACEMENT Left 03/10/2018   Procedure: INSERTION PORT-A-CATH;  Surgeon: Stark Klein, MD;  Location: [place];  Service: General;  Laterality: Left;  . RADIOACTIVE SEED GUIDED EXCISIONAL BREAST BIOPSY Right 09/01/2018   Procedure: RADIOACTIVE SEED GUIDED EXCISIONAL RIGHT BREAST BIOPSY;  Surgeon: Stark Klein, MD;  Location: [place];  Service: General;  Laterality: Right;    FAMILY HISTORY: Family History  Problem Relation Age of Onset  . Hypertension Mother   . Heart disease Mother   . Dementia Mother   . Healthy Father   . Colon cancer Maternal Grandmother   . Cancer Paternal Grandmother        unk type  . Lung cancer Cousin        15s  . Breast cancer Cousin        40s  . Breast cancer Cousin        49s  . Breast cancer Cousin   . Cancer Paternal Aunt        unk type d. 8, possibly pancreatic  . Cancer Paternal Aunt        unk type d. 71s  . Cancer Paternal Uncle        unk type   . Cancer Paternal Uncle        unk type d. 22s  . Cancer Maternal Aunt        unk type, d. 3s  . Throat cancer Maternal Uncle        d. 49s  . Ovarian cancer Neg Hx    Patient father is alive at 75 years old. Patient mother died from heart disease and dementia at age 75.  The patient denies a family hx of ovarian cancer. She has 3 siblings, 1 brother and 2 sisters. She has a maternal cousin diagnosed with lung cancer in her 37s. She has 3 paternal cousins with breast cancer, one was diagnosed in her 73s and has passed away.   GYNECOLOGIC HISTORY:  No LMP recorded. Patient is postmenopausal. Menarche: 69 years old Age at first live birth: n/a GX P 0 LMP 2010 Contraceptive n/a HRT no  Hysterectomy? no BSo? no   SOCIAL HISTORY: She is single and works as a Scientist, water quality at [employer]). She lives alone, with no pets.     ADVANCED DIRECTIVES: Not in place.  At the 03/03/2018 visit she was given the appropriate documents to complete and notarize at her discretion.  She is planning to name her niece, Emily Chambers, as her HCPOA.   HEALTH MAINTENANCE: Social History   Tobacco Use  . Smoking status: Never Smoker  . Smokeless tobacco: Never Used  Substance Use Topics  . Alcohol use: Yes    Comment: seldom  . Drug use: No     Colonoscopy: 2014? [facility]  PAP: 02/02/2018  Bone density: 03/15/2016, T-score 0.4, Dr. Alden Hipp   No Known Allergies  Current Outpatient Medications  Medication Sig Dispense Refill  . amLODipine (NORVASC) 10 MG tablet Take 10 mg by mouth every morning.     Marland Kitchen anastrozole (ARIMIDEX) 1 MG tablet Take 1 tablet (1 mg total) by mouth daily. 90 tablet  4  . aspirin 81 MG chewable tablet Chew 81 mg by mouth daily.    Marland Kitchen losartan (COZAAR) 100 MG tablet Take 100 mg by mouth every morning.     . pravastatin (PRAVACHOL) 20 MG tablet Take 20 mg by mouth every morning.      No current facility-administered medications for this visit.    OBJECTIVE:   Vitals:   09/20/19 1006 09/20/19 1407  BP: 127/73 127/73  Pulse: 68 68  Resp: 18 18  Temp: 97.8 F (36.6 C) 97.8 F (36.6 C)  SpO2: 100% 100%   Wt Readings from Last 3 Encounters:  09/20/19 181 lb 4.8 oz (82.2 kg)  04/29/19 177 lb 6.4 oz (80.5 kg)  04/22/19 176 lb 14.4 oz (80.2 kg)   Body mass index is 33.16 kg/m.   GENERAL: Patient is a well appearing female in no acute distress HEENT:  Sclerae anicteric.  Mask in place. Neck is supple.  NODES:  No cervical, supraclavicular, or axillary lymphadenopathy palpated.  BREAST EXAM:  Left breast s/p lumpectomy and radiation therapy, no sign of local recurrence, right breast benig LUNGS:  Clear to auscultation bilaterally.  No wheezes or rhonchi. HEART:  Regular rate and rhythm. No murmur appreciated. ABDOMEN:  Soft, nontender.  Positive, normoactive bowel sounds. No organomegaly  palpated. MSK:  No focal spinal tenderness to palpation. Full range of motion bilaterally in the upper extremities. EXTREMITIES:  No peripheral edema.   SKIN:  Clear with no obvious rashes or skin changes. No nail dyscrasia. NEURO:  Nonfocal. Well oriented.  Appropriate affect.      LAB RESULTS:  CMP     Component Value Date/Time   NA 143 09/20/2019 1341   K 3.7 09/20/2019 1341   CL 112 (H) 09/20/2019 1341   CO2 23 09/20/2019 1341   GLUCOSE 83 09/20/2019 1341   BUN 14 09/20/2019 1341   CREATININE 0.81 09/20/2019 1341   CALCIUM 10.4 (H) 09/20/2019 1341   PROT 7.3 09/20/2019 1341   ALBUMIN 3.8 09/20/2019 1341   AST 15 09/20/2019 1341   ALT 22 09/20/2019 1341   ALKPHOS 97 09/20/2019 1341   BILITOT 0.3 09/20/2019 1341   GFRNONAA >60 09/20/2019 1341   GFRAA >60 09/20/2019 1341    No results found for: TOTALPROTELP, ALBUMINELP, A1GS, A2GS, BETS, BETA2SER, GAMS, MSPIKE, SPEI  No results found for: KPAFRELGTCHN, LAMBDASER, KAPLAMBRATIO  Lab Results  Component Value Date   WBC 6.9 09/20/2019   NEUTROABS 4.2 09/20/2019   HGB 10.2 (L) 09/20/2019   HCT 33.2 (L) 09/20/2019   MCV 91.0 09/20/2019   PLT 170 09/20/2019   No results found for: LABCA2  No components found for: HQIONG295  No results for input(s): INR in the last 168 hours.  No results found for: LABCA2  No results found for: MWU132  No results found for: GMW102  No results found for: VOZ366  No results found for: CA2729  No components found for: HGQUANT  No results found for: CEA1 / No results found for: CEA1   No results found for: AFPTUMOR  No results found for: [facility]  Appointment on 09/20/2019  Component Date Value Ref Range Status  . Sodium 09/20/2019 143  135 - 145 mmol/L Final  . Potassium 09/20/2019 3.7  3.5 - 5.1 mmol/L Final  . Chloride 09/20/2019 112* 98 - 111 mmol/L Final  . CO2 09/20/2019 23  22 - 32 mmol/L Final  . Glucose, Bld 09/20/2019 83  70 - 99 mg/dL Final   Glucose  reference range applies only to samples taken after fasting for at least 8 hours.  . BUN 09/20/2019 14  8 - 23 mg/dL Final  . Creatinine 09/20/2019 0.81  0.44 - 1.00 mg/dL Final  . Calcium 09/20/2019 10.4* 8.9 - 10.3 mg/dL Final  . Total Protein 09/20/2019 7.3  6.5 - 8.1 g/dL Final  . Albumin 09/20/2019 3.8  3.5 - 5.0 g/dL Final  . AST 09/20/2019 15  15 - 41 U/L Final  . ALT 09/20/2019 22  0 - 44 U/L Final  . Alkaline Phosphatase 09/20/2019 97  38 - 126 U/L Final  . Total Bilirubin 09/20/2019 0.3  0.3 - 1.2 mg/dL Final  . GFR, Est Non Af Am 09/20/2019 >60  >60 mL/min Final  . GFR, Est AFR Am 09/20/2019 >60  >60 mL/min Final  . Anion gap 09/20/2019 8  5 - 15 Final   Performed at [facility], [place] [address] [place], [place] [address]  . WBC Count 09/20/2019 6.9  4.0 - 10.5 K/uL Final  . RBC 09/20/2019 3.65* 3.87 - 5.11 MIL/uL Final  . Hemoglobin 09/20/2019 10.2* 12.0 - 15.0 g/dL Final  . HCT 09/20/2019 33.2* 36 - 46 % Final  . MCV 09/20/2019 91.0  80.0 - 100.0 fL Final  . MCH 09/20/2019 27.9  26.0 - 34.0 pg Final  . MCHC 09/20/2019 30.7  30.0 - 36.0 g/dL Final  . RDW 09/20/2019 13.1  11.5 - 15.5 % Final  . Platelet Count 09/20/2019 170  150 - 400 K/uL Final  . nRBC 09/20/2019 0.0  0.0 - 0.2 % Final  . Neutrophils Relative % 09/20/2019 61  % Final  . Neutro Abs 09/20/2019 4.2  1.7 - 7.7 K/uL Final  . Lymphocytes Relative 09/20/2019 28  % Final  . Lymphs Abs 09/20/2019 1.9  0.7 - 4.0 K/uL Final  . Monocytes Relative 09/20/2019 8  % Final  . Monocytes Absolute 09/20/2019 0.6  0 - 1 K/uL Final  . Eosinophils Relative 09/20/2019 2  % Final  . Eosinophils Absolute 09/20/2019 0.1  0 - 0 K/uL Final  . Basophils Relative 09/20/2019 1  % Final  . Basophils Absolute 09/20/2019 0.1  0 - 0 K/uL Final  . Immature Granulocytes 09/20/2019 0  % Final  . Abs Immature Granulocytes 09/20/2019 0.03  0.00 - 0.07 K/uL Final   Performed at [facility],  [place] [address], [place], [place] [address]    No results found for: HGBA, HGBA2QUANT, HGBFQUANT, HGBSQUAN (Hemoglobinopathy evaluation)   No results found for: LDH  No results found for: IRON, TIBC, IRONPCTSAT (Iron and TIBC)  No results found for: FERRITIN  Urinalysis    Component Value Date/Time   COLORURINE YELLOW 04/12/2018 [place] 04/12/2018 1504   LABSPEC 1.020 04/12/2018 1504   PHURINE 5.0 04/12/2018 1504   GLUCOSEU NEGATIVE 04/12/2018 1504   HGBUR NEGATIVE 04/12/2018 1504   BILIRUBINUR NEGATIVE 04/12/2018 1504   KETONESUR NEGATIVE 04/12/2018 1504   PROTEINUR NEGATIVE 04/12/2018 1504   UROBILINOGEN 0.2 07/07/2012 1159   NITRITE NEGATIVE 04/12/2018 1504   LEUKOCYTESUR TRACE (A) 04/12/2018 1504     STUDIES: No results found.   ELIGIBLE FOR AVAILABLE RESEARCH PROTOCOL: Upbeat   ASSESSMENT: 69 y.o. [place] woman status post left breast biopsy x2 on 02/19/2018, showing  (a) in the upper outer quadrant, a clinical T1c N0, stage IA invasive carcinoma, likely lobular, grade 2, estrogen and progesterone receptor positive, HER-2 not amplified, with an  MIB-1 of 5%  (b) in the lower outer quadrant a clinical T1c N0, stage IA-B invasive ductal carcinoma, grade 2, estrogen receptor only moderately positive at 10%, progesterone receptor negative, with an MIB-1 of 40%, and HER-2 not amplified  (1) neoadjuvant chemotherapy will consist of doxorubicin and cyclophosphamide in dose dense fashion x4 starting 03/16/2018, 08/14/2018 04/27/2018, followed by paclitaxel and carboplatin weekly x12 starting 05/11/2018, completed 07/27/2018  (a) echo on 03/11/2018 shows well preserved EF of 60-65%  (2) status post left lumpectomy and sentinel lymph node sampling 09/01/2018 showing  (a) invasive ductal carcinoma, grade 2, ypT1b ypN0, triple negative  (b) invasive lobular carcinoma, grade 2, ypT1c ypN0, estrogen and progesterone receptor positive, HER-2 not amplified  (3)  adjuvant radiation 10/04/2018 through 11/02/2018 Site Technique Total Dose Dose per Fx Completed Fx Beam Energies  Breast: Breast_Lt 3D 40.05/40.05 2.67 15/15 6X, 10X  Breast: Breast_Lt_Bst 3D 12/12 2 6/6 6X, 10X    (4) anastrozole (for upper outer quadrant tumor) started 12/06/2018  (a) bone density 03/25/2016 at the breast center showed a T score of 0.4 (normal).  (b) bone density 06/2019 was normal  (5) genetics testing on 03/24/2018 showed no pathogenic mutations.  Genes tested include:  APC, ATM, AXIN2, BARD1, BMPR1A, BRCA1, BRCA2, BRIP1, CDH1, CDKN2A (p14ARF), CDKN2A (p16INK4a), CKD4, CHEK2, CTNNA1, DICER1, EPCAM (Deletion/duplication testing only), GREM1 (promoter region deletion/duplication testing only), KIT, MEN1, MLH1, MSH2, MSH3, MSH6, MUTYH, NBN, NF1, NHTL1, PALB2, PDGFRA, PMS2, POLD1, POLE, PTEN, RAD50, RAD51C, RAD51D, SDHB, SDHC, SDHD, SMAD4, SMARCA4. STK11, TP53, TSC1, TSC2, and VHL.  The following genes were evaluated for sequence changes only: SDHA and HOXB13 c.251G>A variant only.   PLAN: Tayten is doing quite well today and has no clinical or radiographic sign of breast cancer recurrence.  She continues on Anastrozole daily with good tolerance, and will stay on this for at least 5 years.  She has no side effects.  She is overdue for her mammogram, and I placed orders for this to be scheduled.  We reviewed her most recent bone density which continues to show strong bones.    We reviewed her labs.  She has a mild new anemia.  We will repeat in 3 months and await the results from her scheduled colonoscopy in three months as well.    I recommended Maecy continue to f/u with her PCP at least annually for her health screenings.  We discussed healthy diet, exercise, and bone health.  She will see Dr. Barry Dienes in 6 months and [place] in one year.  She knows to call for any questions that may arise between now and her next appointment.  We are happy to see her sooner if needed.   Total encounter  time: 20 minutes  Wilber Bihari, NP 09/20/19 2:54 PM Medical Oncology and Hematology [facility] [place], [place] [address] Tel. [phone]    Fax. [phone]  *Total Encounter Time as defined by the Centers for Medicare and Medicaid Services includes, in addition to the face-to-face time of a patient visit (documented in the note above) non-face-to-face time: obtaining and reviewing outside history, ordering and reviewing medications, tests or procedures, care coordination (communications with other health care professionals or caregivers) and documentation in the medical record.

## 2019-09-19 ENCOUNTER — Other Ambulatory Visit: Payer: Self-pay | Admitting: *Deleted

## 2019-09-19 DIAGNOSIS — C50512 Malignant neoplasm of lower-outer quadrant of left female breast: Secondary | ICD-10-CM

## 2019-09-20 ENCOUNTER — Inpatient Hospital Stay: Payer: Medicare Other | Admitting: Adult Health

## 2019-09-20 ENCOUNTER — Telehealth: Payer: Self-pay | Admitting: Adult Health

## 2019-09-20 ENCOUNTER — Inpatient Hospital Stay: Payer: Medicare Other | Attending: Oncology

## 2019-09-20 ENCOUNTER — Other Ambulatory Visit: Payer: Self-pay

## 2019-09-20 ENCOUNTER — Encounter: Payer: Self-pay | Admitting: Adult Health

## 2019-09-20 VITALS — BP 127/73 | HR 68 | Temp 97.8°F | Resp 18 | Ht 62.0 in | Wt 181.3 lb

## 2019-09-20 DIAGNOSIS — C50412 Malignant neoplasm of upper-outer quadrant of left female breast: Secondary | ICD-10-CM | POA: Diagnosis present

## 2019-09-20 DIAGNOSIS — Z171 Estrogen receptor negative status [ER-]: Secondary | ICD-10-CM

## 2019-09-20 DIAGNOSIS — D649 Anemia, unspecified: Secondary | ICD-10-CM | POA: Insufficient documentation

## 2019-09-20 DIAGNOSIS — Z170 Estrogen receptor positive status [ER+]: Secondary | ICD-10-CM | POA: Insufficient documentation

## 2019-09-20 DIAGNOSIS — Z79811 Long term (current) use of aromatase inhibitors: Secondary | ICD-10-CM | POA: Diagnosis not present

## 2019-09-20 DIAGNOSIS — Z79899 Other long term (current) drug therapy: Secondary | ICD-10-CM | POA: Diagnosis not present

## 2019-09-20 DIAGNOSIS — C50512 Malignant neoplasm of lower-outer quadrant of left female breast: Secondary | ICD-10-CM

## 2019-09-20 DIAGNOSIS — Z853 Personal history of malignant neoplasm of breast: Secondary | ICD-10-CM | POA: Diagnosis not present

## 2019-09-20 DIAGNOSIS — Z7982 Long term (current) use of aspirin: Secondary | ICD-10-CM | POA: Insufficient documentation

## 2019-09-20 DIAGNOSIS — N184 Chronic kidney disease, stage 4 (severe): Secondary | ICD-10-CM | POA: Diagnosis not present

## 2019-09-20 LAB — CBC WITH DIFFERENTIAL (CANCER CENTER ONLY)
Abs Immature Granulocytes: 0.03 10*3/uL (ref 0.00–0.07)
Basophils Absolute: 0.1 10*3/uL (ref 0.0–0.1)
Basophils Relative: 1 %
Eosinophils Absolute: 0.1 10*3/uL (ref 0.0–0.5)
Eosinophils Relative: 2 %
HCT: 33.2 % — ABNORMAL LOW (ref 36.0–46.0)
Hemoglobin: 10.2 g/dL — ABNORMAL LOW (ref 12.0–15.0)
Immature Granulocytes: 0 %
Lymphocytes Relative: 28 %
Lymphs Abs: 1.9 10*3/uL (ref 0.7–4.0)
MCH: 27.9 pg (ref 26.0–34.0)
MCHC: 30.7 g/dL (ref 30.0–36.0)
MCV: 91 fL (ref 80.0–100.0)
Monocytes Absolute: 0.6 10*3/uL (ref 0.1–1.0)
Monocytes Relative: 8 %
Neutro Abs: 4.2 10*3/uL (ref 1.7–7.7)
Neutrophils Relative %: 61 %
Platelet Count: 170 10*3/uL (ref 150–400)
RBC: 3.65 MIL/uL — ABNORMAL LOW (ref 3.87–5.11)
RDW: 13.1 % (ref 11.5–15.5)
WBC Count: 6.9 10*3/uL (ref 4.0–10.5)
nRBC: 0 % (ref 0.0–0.2)

## 2019-09-20 LAB — CMP (CANCER CENTER ONLY)
ALT: 22 U/L (ref 0–44)
AST: 15 U/L (ref 15–41)
Albumin: 3.8 g/dL (ref 3.5–5.0)
Alkaline Phosphatase: 97 U/L (ref 38–126)
Anion gap: 8 (ref 5–15)
BUN: 14 mg/dL (ref 8–23)
CO2: 23 mmol/L (ref 22–32)
Calcium: 10.4 mg/dL — ABNORMAL HIGH (ref 8.9–10.3)
Chloride: 112 mmol/L — ABNORMAL HIGH (ref 98–111)
Creatinine: 0.81 mg/dL (ref 0.44–1.00)
GFR, Est AFR Am: >60 mL/min (ref >60)
GFR, Estimated: >60 mL/min (ref >60)
Glucose, Bld: 83 mg/dL (ref 70–99)
Potassium: 3.7 mmol/L (ref 3.5–5.1)
Sodium: 143 mmol/L (ref 135–145)
Total Bilirubin: 0.3 mg/dL (ref 0.3–1.2)
Total Protein: 7.3 g/dL (ref 6.5–8.1)

## 2019-09-20 MED ORDER — ANASTROZOLE 1 MG PO TABS: 1.0000 mg | ORAL_TABLET | Freq: Every day | ORAL | 4 refills | Status: DC

## 2019-09-20 NOTE — Telephone Encounter (Signed)
Scheduled appts per 7/27 los. Gave pt a print out of AVS.

## 2019-09-20 NOTE — Patient Instructions (Signed)

## 2019-12-20 ENCOUNTER — Other Ambulatory Visit: Payer: Self-pay | Admitting: *Deleted

## 2019-12-20 DIAGNOSIS — Z170 Estrogen receptor positive status [ER+]: Secondary | ICD-10-CM

## 2019-12-20 DIAGNOSIS — C50412 Malignant neoplasm of upper-outer quadrant of left female breast: Secondary | ICD-10-CM

## 2019-12-21 ENCOUNTER — Other Ambulatory Visit: Payer: Self-pay

## 2019-12-21 ENCOUNTER — Inpatient Hospital Stay: Payer: Medicare Other | Attending: Oncology

## 2019-12-21 DIAGNOSIS — C50412 Malignant neoplasm of upper-outer quadrant of left female breast: Secondary | ICD-10-CM | POA: Diagnosis present

## 2019-12-21 DIAGNOSIS — Z79811 Long term (current) use of aromatase inhibitors: Secondary | ICD-10-CM | POA: Diagnosis not present

## 2019-12-21 DIAGNOSIS — Z170 Estrogen receptor positive status [ER+]: Secondary | ICD-10-CM | POA: Diagnosis not present

## 2019-12-21 LAB — CMP (CANCER CENTER ONLY)
ALT: 25 U/L (ref 0–44)
AST: 17 U/L (ref 15–41)
Albumin: 3.9 g/dL (ref 3.5–5.0)
Alkaline Phosphatase: 108 U/L (ref 38–126)
Anion gap: 7 (ref 5–15)
BUN: 15 mg/dL (ref 8–23)
CO2: 27 mmol/L (ref 22–32)
Calcium: 10 mg/dL (ref 8.9–10.3)
Chloride: 107 mmol/L (ref 98–111)
Creatinine: 0.77 mg/dL (ref 0.44–1.00)
GFR, Estimated: >60 mL/min (ref >60)
Glucose, Bld: 97 mg/dL (ref 70–99)
Potassium: 3.5 mmol/L (ref 3.5–5.1)
Sodium: 141 mmol/L (ref 135–145)
Total Bilirubin: 0.3 mg/dL (ref 0.3–1.2)
Total Protein: 7.2 g/dL (ref 6.5–8.1)

## 2019-12-21 LAB — CBC WITH DIFFERENTIAL (CANCER CENTER ONLY)
Abs Immature Granulocytes: 0.03 10*3/uL (ref 0.00–0.07)
Basophils Absolute: 0.1 10*3/uL (ref 0.0–0.1)
Basophils Relative: 1 %
Eosinophils Absolute: 0.1 10*3/uL (ref 0.0–0.5)
Eosinophils Relative: 1 %
HCT: 33.2 % — ABNORMAL LOW (ref 36.0–46.0)
Hemoglobin: 10.6 g/dL — ABNORMAL LOW (ref 12.0–15.0)
Immature Granulocytes: 0 %
Lymphocytes Relative: 27 %
Lymphs Abs: 1.9 10*3/uL (ref 0.7–4.0)
MCH: 28.2 pg (ref 26.0–34.0)
MCHC: 31.9 g/dL (ref 30.0–36.0)
MCV: 88.3 fL (ref 80.0–100.0)
Monocytes Absolute: 0.5 10*3/uL (ref 0.1–1.0)
Monocytes Relative: 7 %
Neutro Abs: 4.3 10*3/uL (ref 1.7–7.7)
Neutrophils Relative %: 64 %
Platelet Count: 176 10*3/uL (ref 150–400)
RBC: 3.76 MIL/uL — ABNORMAL LOW (ref 3.87–5.11)
RDW: 13.2 % (ref 11.5–15.5)
WBC Count: 6.8 10*3/uL (ref 4.0–10.5)
nRBC: 0 % (ref 0.0–0.2)

## 2020-02-04 ENCOUNTER — Ambulatory Visit
Admission: EM | Admit: 2020-02-04 | Discharge: 2020-02-04 | Disposition: A | Discharge status: Home / Self Care | Payer: Medicare Other | Attending: Emergency Medicine | Admitting: Emergency Medicine

## 2020-02-04 ENCOUNTER — Other Ambulatory Visit: Payer: Self-pay

## 2020-02-04 ENCOUNTER — Encounter: Payer: Self-pay | Admitting: Emergency Medicine

## 2020-02-04 DIAGNOSIS — J069 Acute upper respiratory infection, unspecified: Secondary | ICD-10-CM

## 2020-02-04 DIAGNOSIS — Z20822 Contact with and (suspected) exposure to covid-19: Secondary | ICD-10-CM

## 2020-02-04 MED ORDER — BENZONATATE 200 MG PO CAPS: 200.0000 mg | ORAL_CAPSULE | Freq: Three times a day (TID) | ORAL | 0 refills | Status: AC | PRN

## 2020-02-04 MED ORDER — DM-GUAIFENESIN ER 30-600 MG PO TB12: 1.0000 | ORAL_TABLET | Freq: Two times a day (BID) | ORAL | 0 refills | Status: DC

## 2020-02-04 MED ORDER — FLUTICASONE PROPIONATE 50 MCG/ACT NA SUSP: 1.0000 | Freq: Every day | NASAL | 0 refills | Status: AC

## 2020-02-04 MED ORDER — AMOXICILLIN-POT CLAVULANATE 875-125 MG PO TABS: 1.0000 | ORAL_TABLET | Freq: Two times a day (BID) | ORAL | 0 refills | Status: AC

## 2020-02-04 NOTE — ED Triage Notes (Signed)
Pt here for nasal congestion and cough x 5 days

## 2020-02-04 NOTE — Discharge Instructions (Signed)
Covid test pending, monitor my chart for results Continue with Flonase nasal spray 1 to 2 spray in each nostril daily for sinus congestion pressure,  Mucinex DM twice daily for congestion and cough Tessalon every 8 hours for cough Rest and fluids If not seeing any improvement with the above over the next 3 to 4 days may fill prescription for Augmentin on Tuesday to treat for sinus infection  Please follow-up if any symptoms not improving or worsening

## 2020-02-04 NOTE — ED Provider Notes (Signed)
[name]    CSN: [id number] Arrival date & time: 02/04/20  1036      History   Chief Complaint Chief Complaint  Patient presents with   Nasal Congestion   Cough    HPI Emily Chambers is a 69 y.o. female history of breast cancer presenting today for evaluation of URI symptoms.  Reports over the past 5 to 6 days she has had a lot of cough and congestion.  Initially with sinus pressure and drainage, but over the past couple days has noticed more symptoms moving into chest.  Cough is productive.  Denies fevers.  Denies known exposure to Covid.  HPI  Past Medical History:  Diagnosis Date   Cancer ([place])    left breast cancer   Family history of breast cancer    Family history of lung cancer    Family history of throat cancer    High cholesterol    Hypertension    Kidney stone    Kidney stones    UTI (lower urinary tract infection)     Patient Active Problem List   Diagnosis Date Noted   Genetic testing 03/24/2018   Port-A-Cath in place 03/17/2018   Family history of breast cancer    Family history of throat cancer    Family history of lung cancer    Malignant neoplasm of lower-outer quadrant of left breast of female, estrogen receptor negative ([place]) 02/26/2018   Malignant neoplasm of upper-outer quadrant of left breast in female, estrogen receptor positive ([place]) 02/26/2018   Essential hypertension 10/23/2014   Hyperlipidemia 10/23/2014    Past Surgical History:  Procedure Laterality Date   BREAST EXCISIONAL BIOPSY Left 1999,2005   cysts removed   BREAST LUMPECTOMY Left 2020   BREAST LUMPECTOMY WITH RADIOACTIVE SEED AND SENTINEL LYMPH NODE BIOPSY Left 09/01/2018   Procedure: LEFT BREAST  RADIOACTIVE SEED LUMPECTOMY X2 AND LEFT SENTINEL LYMPH NODE BIOPSY AND MAPPING;  Surgeon: Stark Klein, MD;  Location: [place];  Service: General;  Laterality: Left;   BREAST SURGERY Left    cyst and biopsy   PORT-A-CATH  REMOVAL Left 01/06/2019   Procedure: REMOVAL PORT-A-CATH;  Surgeon: Stark Klein, MD;  Location: [place];  Service: General;  Laterality: Left;   PORTACATH PLACEMENT Left 03/10/2018   Procedure: INSERTION PORT-A-CATH;  Surgeon: Stark Klein, MD;  Location: [place];  Service: General;  Laterality: Left;   RADIOACTIVE SEED GUIDED EXCISIONAL BREAST BIOPSY Right 09/01/2018   Procedure: RADIOACTIVE SEED GUIDED EXCISIONAL RIGHT BREAST BIOPSY;  Surgeon: Stark Klein, MD;  Location: [place];  Service: General;  Laterality: Right;    OB History   No obstetric history on file.      Home Medications    Prior to Admission medications   Medication Sig Start Date End Date Taking? Authorizing Provider  amLODipine (NORVASC) 10 MG tablet Take 10 mg by mouth every morning.     [provider]  amoxicillin-clavulanate (AUGMENTIN) 875-125 MG tablet Take 1 tablet by mouth every 12 (twelve) hours for 7 days. 02/07/20 02/14/20  Les Longmore C, PA-C  anastrozole (ARIMIDEX) 1 MG tablet Take 1 tablet (1 mg total) by mouth daily. 09/20/19   Gardenia Phlegm, NP  aspirin 81 MG chewable tablet Chew 81 mg by mouth daily.    [provider]  benzonatate (TESSALON) 200 MG capsule Take 1 capsule (200 mg total) by mouth 3 (three) times daily as needed for up to 7 days  for cough. 02/04/20 02/11/20  Soren Lazarz C, PA-C  dextromethorphan-guaiFENesin (MUCINEX DM) 30-600 MG 12hr tablet Take 1 tablet by mouth 2 (two) times daily. 02/04/20   Mekala Winger C, PA-C  fluticasone (FLONASE) 50 MCG/ACT nasal spray Place 1-2 sprays into both nostrils daily. 02/04/20   Kortlynn Poust C, PA-C  losartan (COZAAR) 100 MG tablet Take 100 mg by mouth every morning.     [provider]  pravastatin (PRAVACHOL) 20 MG tablet Take 20 mg by mouth every morning.     [provider]    Family History Family History  Problem Relation Age of  Onset   Hypertension Mother    Heart disease Mother    Dementia Mother    Healthy Father    Colon cancer Maternal Grandmother    Cancer Paternal Grandmother        unk type   Lung cancer Cousin        73s   Breast cancer Cousin        21s   Breast cancer Cousin        52s   Breast cancer Cousin    Cancer Paternal Aunt        unk type d. 38, possibly pancreatic   Cancer Paternal Aunt        unk type d. 61s   Cancer Paternal Uncle        unk type    Cancer Paternal Uncle        unk type d. 72s   Cancer Maternal Aunt        unk type, d. 44s   Throat cancer Maternal Uncle        d. 73s   Ovarian cancer Neg Hx     Social History Social History   Tobacco Use   Smoking status: Never Smoker   Smokeless tobacco: Never Used  Substance Use Topics   Alcohol use: Yes    Comment: seldom   Drug use: No     Allergies   Patient has no known allergies.   Review of Systems Review of Systems  Constitutional: Negative for activity change, appetite change, chills, fatigue and fever.  HENT: Positive for congestion, rhinorrhea, sinus pressure and sore throat. Negative for ear pain and trouble swallowing.   Eyes: Negative for discharge and redness.  Respiratory: Positive for cough. Negative for chest tightness and shortness of breath.   Cardiovascular: Negative for chest pain.  Gastrointestinal: Negative for abdominal pain, diarrhea, nausea and vomiting.  Musculoskeletal: Negative for myalgias.  Skin: Negative for rash.  Neurological: Negative for dizziness, light-headedness and headaches.     Physical Exam Triage Vital Signs ED Triage Vitals  Enc Vitals Group     BP      Pulse      Resp      Temp      Temp src      SpO2      Weight      Height      Head Circumference      Peak Flow      Pain Score      Pain Loc      Pain Edu?      Excl. in [place]?    No data found.  Updated Vital Signs BP 130/70 (BP Location: Right Arm)    Pulse 92    Temp  99.1 F (37.3 C) (Oral)    Resp 18    SpO2 95%   Visual Acuity Right Eye  Distance:   Left Eye Distance:   Bilateral Distance:    Right Eye Near:   Left Eye Near:    Bilateral Near:     Physical Exam Vitals and nursing note reviewed.  Constitutional:      Appearance: She is well-developed and well-nourished.     Comments: No acute distress  HENT:     Head: Normocephalic and atraumatic.     Ears:     Comments: Bilateral ears without tenderness to palpation of external auricle, tragus and mastoid, EAC's without erythema or swelling, TM's with good bony landmarks and cone of light. Non erythematous.     Nose: Nose normal.     Mouth/Throat:     Comments: Oral mucosa pink and moist, no tonsillar enlargement or exudate. Posterior pharynx patent and nonerythematous, no uvula deviation or swelling. Normal phonation. Eyes:     Conjunctiva/sclera: Conjunctivae normal.  Cardiovascular:     Rate and Rhythm: Normal rate and regular rhythm.  Pulmonary:     Effort: Pulmonary effort is normal. No respiratory distress.     Comments: Breathing comfortably at rest, CTABL, no wheezing, rales or other adventitious sounds auscultated Abdominal:     General: There is no distension.  Musculoskeletal:        General: Normal range of motion.     Cervical back: Neck supple.  Skin:    General: Skin is warm and dry.  Neurological:     Mental Status: She is alert and oriented to person, place, and time.  Psychiatric:        Mood and Affect: Mood and affect normal.      UC Treatments / Results  Labs (all labs ordered are listed, but only abnormal results are displayed) Labs Reviewed  NOVEL CORONAVIRUS, NAA    EKG   Radiology No results found.  Procedures Procedures (including critical care time)  Medications Ordered in UC Medications - No data to display  Initial Impression / Assessment and Plan / UC Course  I have reviewed the triage vital signs and the nursing notes.  Pertinent  labs & imaging results that were available during my care of the patient were reviewed by me and considered in my medical decision making (see chart for details).     Viral URI with cough-Covid test pending, recommending continued symptomatic and supportive care for additional 3 to 4 days.  Patient is approaching week mark of symptoms, will provide prescription for Augmentin to treat sinusitis if still not having any improvement with recommendations today over the next 3 to 4 days.  Push fluids.  Discussed strict return precautions. Patient verbalized understanding and is agreeable with plan.  Final Clinical Impressions(s) / UC Diagnoses   Final diagnoses:  Encounter for screening laboratory testing for COVID-19 virus  Viral URI with cough     Discharge Instructions     Covid test pending, monitor my chart for results Continue with Flonase nasal spray 1 to 2 spray in each nostril daily for sinus congestion pressure,  Mucinex DM twice daily for congestion and cough Tessalon every 8 hours for cough Rest and fluids If not seeing any improvement with the above over the next 3 to 4 days may fill prescription for Augmentin on Tuesday to treat for sinus infection  Please follow-up if any symptoms not improving or worsening    ED Prescriptions    Medication Sig Dispense Auth. Provider   benzonatate (TESSALON) 200 MG capsule Take 1 capsule (200 mg total) by mouth 3 (three)  times daily as needed for up to 7 days for cough. 28 capsule Lavinia Mcneely C, PA-C   fluticasone (FLONASE) 50 MCG/ACT nasal spray Place 1-2 sprays into both nostrils daily. 16 g Avelynn Sellin C, PA-C   dextromethorphan-guaiFENesin (MUCINEX DM) 30-600 MG 12hr tablet Take 1 tablet by mouth 2 (two) times daily. 20 tablet Fateh Kindle C, PA-C   amoxicillin-clavulanate (AUGMENTIN) 875-125 MG tablet Take 1 tablet by mouth every 12 (twelve) hours for 7 days. 14 tablet Laurelle Skiver, [place] C, PA-C     PDMP not reviewed this  encounter.   Janith Lima, PA-C 02/04/20 1134

## 2020-02-06 LAB — NOVEL CORONAVIRUS, NAA: SARS-CoV-2, NAA: NOT DETECTED

## 2020-02-06 LAB — SARS-COV-2, NAA 2 DAY TAT

## 2020-03-17 NOTE — Progress Notes (Signed)
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   She takes anastrozole daily.  She obtains it at a very low price, actually the most recent prescription she got for free.  She has very few if any hot flashes which are mild.  She has no vaginal dryness issues.  Her most recent bone density was completed on 07/01/2019 and showed normal bone density.    She is scheduled for routine mammography on 04/26/2020.   REVIEW OF SYSTEMS: Emily Chambers has some aches and pains here and there which she attributes I think correctly to arthritis.  The holidays were quiet.  She continues to work full-time.  A detailed review of systems today was otherwise stable.   COVID 19 VACCINATION STATUS: Status post Pfizer x2 with booster October 2021   HISTORY OF CURRENT ILLNESS: From the original intake note:  Emily Chambers had routine screening mammography on 02/10/2018 showing a possible abnormality in the left breast. She underwent bilateral diagnostic mammography with tomography and left breast ultrasonography at [facility] on 02/16/2018 showing: breast density category C; two left breast masses, one at 2 o'clock and the other at 3:30 o'clock. The 2 o'clock mass (1.3 x 1 x 1 cm) corresponds to the mammographic abnormality and  is highly suspicious for breast carcinoma. The other mass (1.2 x 0.7 x 1.2 cm)  is suspicious for breast carcinoma. No left axillary adenopathy.   Accordingly on 02/19/2018 she proceeded to biopsy of the left breast area in question. The pathology from this procedure ([id number]) showed:  1) 3:30 o'clock specimen showed invasive mammary carcinoma, possibly lobular (weak and atypical e-cadherin expression), grade 2. Prognostic indicators significant for: estrogen receptor, 90% positive and progesterone receptor, 95% positive, both with strong staining intensity. Proliferation marker Ki67 at 5%. HER2 negative (1+).   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 Prognostic indicators significant for: estrogen receptor, 10% positive with moderate staining intensity and progesterone receptor, 0% negative. Proliferation marker Ki67 at 40%. HER2 negative (1+).   The patient's subsequent history is as detailed below.   PAST MEDICAL HISTORY: Past Medical History:  Diagnosis Date  . Cancer [facility])    left breast cancer  . Family history of breast cancer   . Family history of lung cancer   . Family history of throat cancer   . High cholesterol   . Hypertension   . Kidney stone   . Kidney stones   . UTI (lower urinary tract infection)     PAST SURGICAL HISTORY: Past Surgical History:  Procedure Laterality Date  . BREAST EXCISIONAL BIOPSY Left 1999,2005   cysts removed  . BREAST LUMPECTOMY Left 2020  . BREAST LUMPECTOMY WITH RADIOACTIVE SEED AND SENTINEL LYMPH NODE BIOPSY Left 09/01/2018   Procedure: LEFT BREAST  RADIOACTIVE SEED LUMPECTOMY X2 AND LEFT SENTINEL LYMPH NODE BIOPSY AND MAPPING;  Surgeon: Stark Klein, MD;  Location: [place];  Service:  General;  Laterality: Left;  . BREAST SURGERY Left    cyst and biopsy  . PORT-A-CATH REMOVAL Left 01/06/2019   Procedure: REMOVAL PORT-A-CATH;  Surgeon: Stark Klein, MD;  Location: [place];   Service: General;  Laterality: Left;  . PORTACATH PLACEMENT Left 03/10/2018   Procedure: INSERTION PORT-A-CATH;  Surgeon: Stark Klein, MD;  Location: [place];  Service: General;  Laterality: Left;  . RADIOACTIVE SEED GUIDED EXCISIONAL BREAST BIOPSY Right 09/01/2018   Procedure: RADIOACTIVE SEED GUIDED EXCISIONAL RIGHT BREAST BIOPSY;  Surgeon: Stark Klein, MD;  Location: [place];  Service: General;  Laterality: Right;    FAMILY HISTORY: Family History  Problem Relation Age of Onset  . Hypertension Mother   . Heart disease Mother   . Dementia Mother   . Healthy Father   . Colon cancer Maternal Grandmother   . Cancer Paternal Grandmother        unk type  . Lung cancer Cousin        76s  . Breast cancer Cousin        50s  . Breast cancer Cousin        77s  . Breast cancer Cousin   . Cancer Paternal Aunt        unk type d. 36, possibly pancreatic  . Cancer Paternal Aunt        unk type d. 76s  . Cancer Paternal Uncle        unk type   . Cancer Paternal Uncle        unk type d. 38s  . Cancer Maternal Aunt        unk type, d. 16s  . Throat cancer Maternal Uncle        d. 55s  . Ovarian cancer Neg Hx   Patient father is alive at 70 years old. Patient mother died from heart disease and dementia at age 55.  The patient denies a family hx of ovarian cancer. She has 3 siblings, 1 brother and 2 sisters. She has a maternal cousin diagnosed with lung cancer in her 8s. She has 3 paternal cousins with breast cancer, one was diagnosed in her 75s and has passed away.   GYNECOLOGIC HISTORY:  No LMP recorded. Patient is postmenopausal. Menarche: 70 years old Age at first live birth: n/a GX P 0 LMP 2010 Contraceptive n/a HRT no  Hysterectomy? no BSo? no   SOCIAL HISTORY:  She is single and works as a Scientist, water quality at [employer]). She lives alone, with no pets.    ADVANCED DIRECTIVES: Not in place.  At the  03/03/2018 visit she was given the appropriate documents to complete and notarize at her discretion.  She is planning to name her niece, Emily Chambers, as her HCPOA.   HEALTH MAINTENANCE: Social History   Tobacco Use  . Smoking status: Never Smoker  . Smokeless tobacco: Never Used  Substance Use Topics  . Alcohol use: Yes    Comment: seldom  . Drug use: No     Colonoscopy: 2014? [facility]  PAP: 02/02/2018  Bone density: 03/15/2016, T-score 0.4, Dr. Alden Hipp   No Known Allergies  Current Outpatient Medications  Medication Sig Dispense Refill  . amLODipine (NORVASC) 10 MG tablet Take 10 mg by mouth every morning.     Marland Kitchen anastrozole (ARIMIDEX) 1 MG tablet Take 1 tablet (1 mg total) by mouth daily. 90 tablet 4  . aspirin 81 MG chewable tablet Chew  81 mg by mouth daily.    Marland Kitchen dextromethorphan-guaiFENesin (MUCINEX DM) 30-600 MG 12hr tablet Take 1 tablet by mouth 2 (two) times daily. 20 tablet 0  . fluticasone (FLONASE) 50 MCG/ACT nasal spray Place 1-2 sprays into both nostrils daily. 16 g 0  . losartan (COZAAR) 100 MG tablet Take 100 mg by mouth every morning.     . pravastatin (PRAVACHOL) 20 MG tablet Take 20 mg by mouth every morning.      No current facility-administered medications for this visit.    OBJECTIVE: African-American woman who appears younger than stated age  19:   03/19/20 1152  BP: 124/74  Pulse: 84  Resp: 18  Temp: 97.7 F (36.5 C)  SpO2: 98%   Wt Readings from Last 3 Encounters:  03/19/20 179 lb 1.6 oz (81.2 kg)  09/20/19 181 lb 4.8 oz (82.2 kg)  04/29/19 177 lb 6.4 oz (80.5 kg)   Body mass index is 32.76 kg/m.    Sclerae unicteric, EOMs intact Wearing a mask No cervical or supraclavicular adenopathy Lungs no rales or rhonchi Heart regular rate and rhythm Abd soft, nontender, positive bowel sounds MSK no focal spinal tenderness, no upper extremity lymphedema Neuro: nonfocal, well oriented, appropriate affect Breasts: Right breast is benign.   The left breast is status post lumpectomy and radiation.  There is no evidence of local recurrence.  Both axillae are benign   LAB RESULTS:  CMP     Component Value Date/Time   NA 141 12/21/2019 1358   K 3.5 12/21/2019 1358   CL 107 12/21/2019 1358   CO2 27 12/21/2019 1358   GLUCOSE 97 12/21/2019 1358   BUN 15 12/21/2019 1358   CREATININE 0.77 12/21/2019 1358   CALCIUM 10.0 12/21/2019 1358   PROT 7.2 12/21/2019 1358   ALBUMIN 3.9 12/21/2019 1358   AST 17 12/21/2019 1358   ALT 25 12/21/2019 1358   ALKPHOS 108 12/21/2019 1358   BILITOT 0.3 12/21/2019 1358   GFRNONAA >60 12/21/2019 1358   GFRAA >60 09/20/2019 1341    No results found for: TOTALPROTELP, ALBUMINELP, A1GS, A2GS, BETS, BETA2SER, GAMS, MSPIKE, SPEI  No results found for: KPAFRELGTCHN, LAMBDASER, KAPLAMBRATIO  Lab Results  Component Value Date   WBC 6.8 12/21/2019   NEUTROABS 4.3 12/21/2019   HGB 10.6 (L) 12/21/2019   HCT 33.2 (L) 12/21/2019   MCV 88.3 12/21/2019   PLT 176 12/21/2019   No results found for: LABCA2  No components found for: IDPOEU235  No results for input(s): INR in the last 168 hours.  No results found for: LABCA2  No results found for: TIR443  No results found for: XVQ008  No results found for: QPY195  No results found for: CA2729  No components found for: HGQUANT  No results found for: CEA1 / No results found for: CEA1   No results found for: AFPTUMOR  No results found for: [facility]  No visits with results within 3 Day(s) from this visit.  Latest known visit with results is:  Admission on 02/04/2020, Discharged on 02/04/2020  Component Date Value Ref Range Status  . SARS-CoV-2, NAA 02/04/2020 Not Detected  Not Detected Final   Comment: This nucleic acid amplification test was developed and its performance characteristics determined by [employer]. Nucleic acid amplification tests include RT-PCR and TMA. This test has not been FDA cleared or approved. This  test has been authorized by FDA under an Emergency Use Authorization (EUA). This test is only authorized for the duration of time the  declaration that circumstances exist justifying the authorization of the emergency use of in vitro diagnostic tests for detection of SARS-CoV-2 virus and/or diagnosis of COVID-19 infection under section 564(b)(1) of the Act, 21 U.S.C. 536RWE-3(X) (1), unless the authorization is terminated or revoked sooner. When diagnostic testing is negative, the possibility of a false negative result should be considered in the context of a patient's recent exposures and the presence of clinical signs and symptoms consistent with COVID-19. An individual without symptoms of COVID-19 and who is not shedding SARS-CoV-2 virus wo                          uld expect to have a negative (not detected) result in this assay.   Marland Kitchen SARS-CoV-2, NAA 2 DAY TAT 02/04/2020 Performed   Final    No results found for: HGBA, HGBA2QUANT, HGBFQUANT, HGBSQUAN (Hemoglobinopathy evaluation)   No results found for: LDH  No results found for: IRON, TIBC, IRONPCTSAT (Iron and TIBC)  No results found for: FERRITIN  Urinalysis    Component Value Date/Time   COLORURINE YELLOW 04/12/2018 1504   APPEARANCEUR CLEAR 04/12/2018 1504   LABSPEC 1.020 04/12/2018 1504   PHURINE 5.0 04/12/2018 1504   GLUCOSEU NEGATIVE 04/12/2018 1504   HGBUR NEGATIVE 04/12/2018 1504   BILIRUBINUR NEGATIVE 04/12/2018 1504   KETONESUR NEGATIVE 04/12/2018 1504   PROTEINUR NEGATIVE 04/12/2018 1504   UROBILINOGEN 0.2 07/07/2012 1159   NITRITE NEGATIVE 04/12/2018 1504   LEUKOCYTESUR TRACE (A) 04/12/2018 1504    STUDIES: No results found.   ELIGIBLE FOR AVAILABLE RESEARCH PROTOCOL: Upbeat   ASSESSMENT: 70 y.o. [place] woman status post left breast biopsy x2 on 02/19/2018, showing  (a) in the upper outer quadrant, a clinical T1c N0, stage IA invasive carcinoma, likely lobular, grade 2, estrogen and  progesterone receptor positive, HER-2 not amplified, with an MIB-1 of 5%  (b) in the lower outer quadrant a clinical T1c N0, stage IA-B invasive ductal carcinoma, grade 2, estrogen receptor only moderately positive at 10%, progesterone receptor negative, with an MIB-1 of 40%, and HER-2 not amplified  (1) neoadjuvant chemotherapy will consist of doxorubicin and cyclophosphamide in dose dense fashion x4 starting 03/16/2018, 08/14/2018 04/27/2018, followed by paclitaxel and carboplatin weekly x12 starting 05/11/2018, completed 07/27/2018  (a) echo on 03/11/2018 shows well preserved EF of 60-65%  (2) status post left lumpectomy and sentinel lymph node sampling 09/01/2018 showing  (a) invasive ductal carcinoma, grade 2, ypT1b ypN0, triple negative  (b) invasive lobular carcinoma, grade 2, ypT1c ypN0, estrogen and progesterone receptor positive, HER-2 not amplified  (3) adjuvant radiation 10/04/2018 through 11/02/2018 Site Technique Total Dose Dose per Fx Completed Fx Beam Energies  Breast: Breast_Lt 3D 40.05/40.05 2.67 15/15 6X, 10X  Breast: Breast_Lt_Bst 3D 12/12 2 6/6 6X, 10X    (4) anastrozole (for upper outer quadrant tumor) started 12/06/2018  (a) bone density 03/25/2016 at the breast center showed a T score of 0.4 (normal).  (b) bone density 06/2019 was normal  (5) genetics testing on 03/24/2018 showed no pathogenic mutations.  Genes tested include:  APC, ATM, AXIN2, BARD1, BMPR1A, BRCA1, BRCA2, BRIP1, CDH1, CDKN2A (p14ARF), CDKN2A (p16INK4a), CKD4, CHEK2, CTNNA1, DICER1, EPCAM (Deletion/duplication testing only), GREM1 (promoter region deletion/duplication testing only), KIT, MEN1, MLH1, MSH2, MSH3, MSH6, MUTYH, NBN, NF1, NHTL1, PALB2, PDGFRA, PMS2, POLD1, POLE, PTEN, RAD50, RAD51C, RAD51D, SDHB, SDHC, SDHD, SMAD4, SMARCA4. STK11, TP53, TSC1, TSC2, and VHL.  The following genes were evaluated for sequence changes only: SDHA and HOXB13 c.251G>A  variant only.   PLAN: Naylene is now 1-1/2 years out  from definitive surgery for breast cancer with no evidence of disease recurrence.  This is favorable.  She is tolerating anastrozole well and the plan will be to continue that for a total of 5 years  She will have mammography in March and she will see me in 6 months.  She knows to call for any other issue that may develop before then  Total encounter time 25 minutes.Sarajane Jews C. Marveen Donlon, MD 03/19/20 6:30 PM Medical Oncology and Hematology [facility] [place], [place] [address] Tel. [phone]    Fax. [phone]   I, Wilburn Mylar, am acting as scribe for Dr. Virgie Dad. Raynell Scott.  I, Lurline Del MD, have reviewed the above documentation for accuracy and completeness, and I agree with the above.    *Total Encounter Time as defined by the Centers for Medicare and Medicaid Services includes, in addition to the face-to-face time of a patient visit (documented in the note above) non-face-to-face time: obtaining and reviewing outside history, ordering and reviewing medications, tests or procedures, care coordination (communications with other health care professionals or caregivers) and documentation in the medical record.

## 2020-03-19 ENCOUNTER — Inpatient Hospital Stay: Payer: Medicare Other | Attending: Adult Health | Admitting: Oncology

## 2020-03-19 ENCOUNTER — Other Ambulatory Visit: Payer: Self-pay

## 2020-03-19 VITALS — BP 124/74 | HR 84 | Temp 97.7°F | Resp 18 | Ht 62.0 in | Wt 179.1 lb

## 2020-03-19 DIAGNOSIS — Z170 Estrogen receptor positive status [ER+]: Secondary | ICD-10-CM | POA: Diagnosis not present

## 2020-03-19 DIAGNOSIS — Z79811 Long term (current) use of aromatase inhibitors: Secondary | ICD-10-CM | POA: Insufficient documentation

## 2020-03-19 DIAGNOSIS — Z171 Estrogen receptor negative status [ER-]: Secondary | ICD-10-CM | POA: Diagnosis not present

## 2020-03-19 DIAGNOSIS — C50412 Malignant neoplasm of upper-outer quadrant of left female breast: Secondary | ICD-10-CM | POA: Insufficient documentation

## 2020-03-19 DIAGNOSIS — C50512 Malignant neoplasm of lower-outer quadrant of left female breast: Secondary | ICD-10-CM | POA: Diagnosis not present

## 2020-03-19 MED ORDER — ANASTROZOLE 1 MG PO TABS: 1.0000 mg | ORAL_TABLET | Freq: Every day | ORAL | 4 refills | Status: DC

## 2020-03-20 ENCOUNTER — Telehealth: Payer: Self-pay | Admitting: Oncology

## 2020-03-20 NOTE — Telephone Encounter (Signed)
Appts requested in 1/24 los. Were already on pt's schedule from a previous LOS. Made no changes to pt's schedule.

## 2020-04-03 DIAGNOSIS — R55 Syncope and collapse: Secondary | ICD-10-CM | POA: Diagnosis not present

## 2020-04-03 DIAGNOSIS — K047 Periapical abscess without sinus: Secondary | ICD-10-CM | POA: Diagnosis not present

## 2020-04-25 IMAGING — MG DIGITAL DIAGNOSTIC BILAT W/ TOMO W/ CAD
6 of 9 series · 6 of 25 positions shown · non-contrast
Comparison: Previous exam(s).

CLINICAL DATA: As for bilateral diagnostic examination due to a 1
week history of left breast erythema with firm palpable abnormality
over the outer breast. Patient was begun on Rocephin 2 days ago for
presumed infection. Patient notes slight symptomatic improvement
since beginning antibiotics. History of left malignant lumpectomy
August 2018 with subsequent chemotherapy and radiation completed
October 2018.

EXAM:
DIGITAL DIAGNOSTIC bilateral MAMMOGRAM WITH CAD AND TOMO
ULTRASOUND left BREAST

[L MLO]
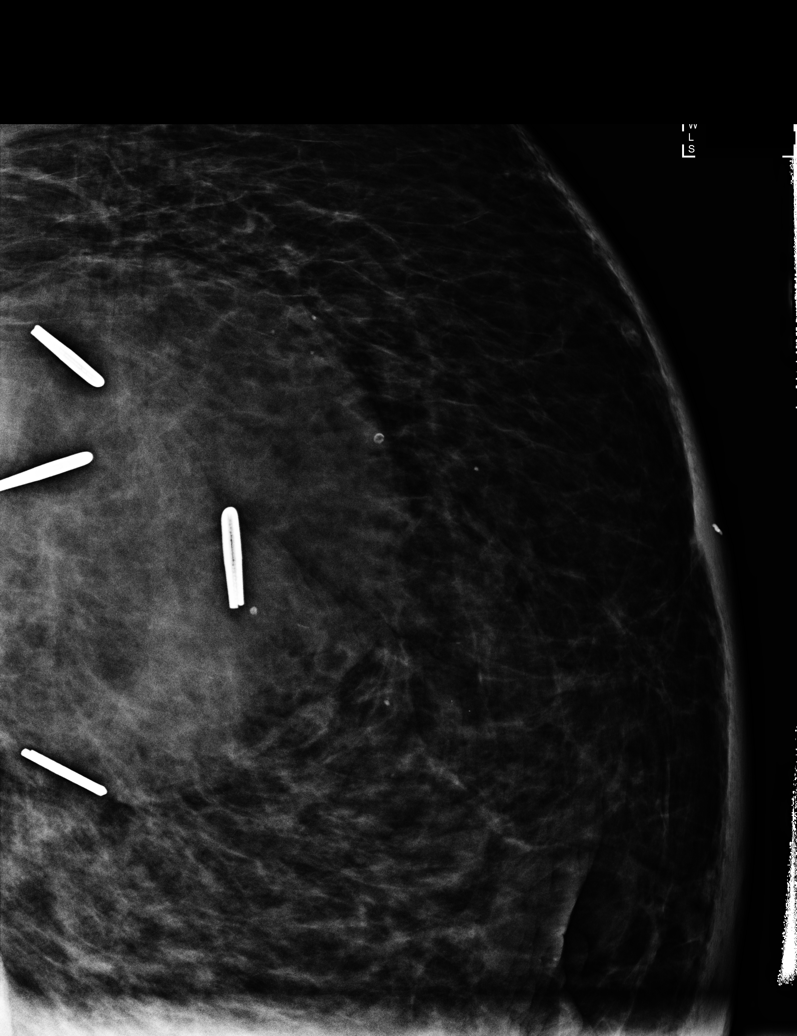

[R CC synth-2D]
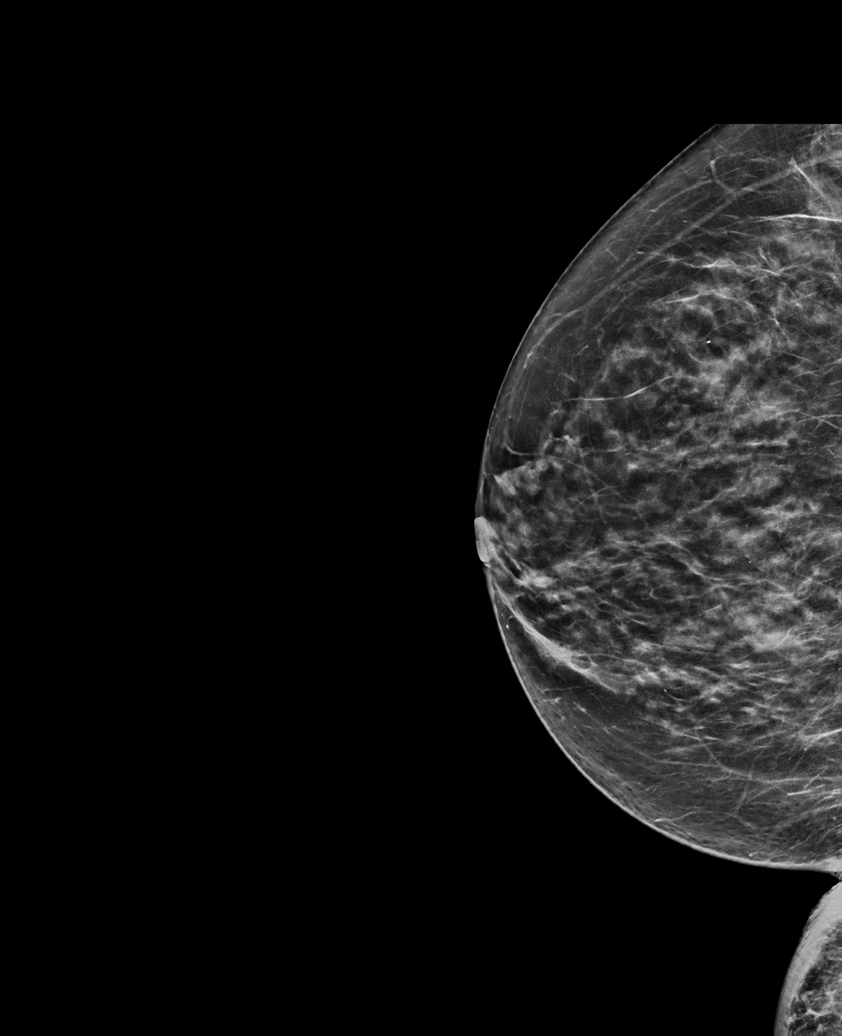

[L MLO synth-2D]
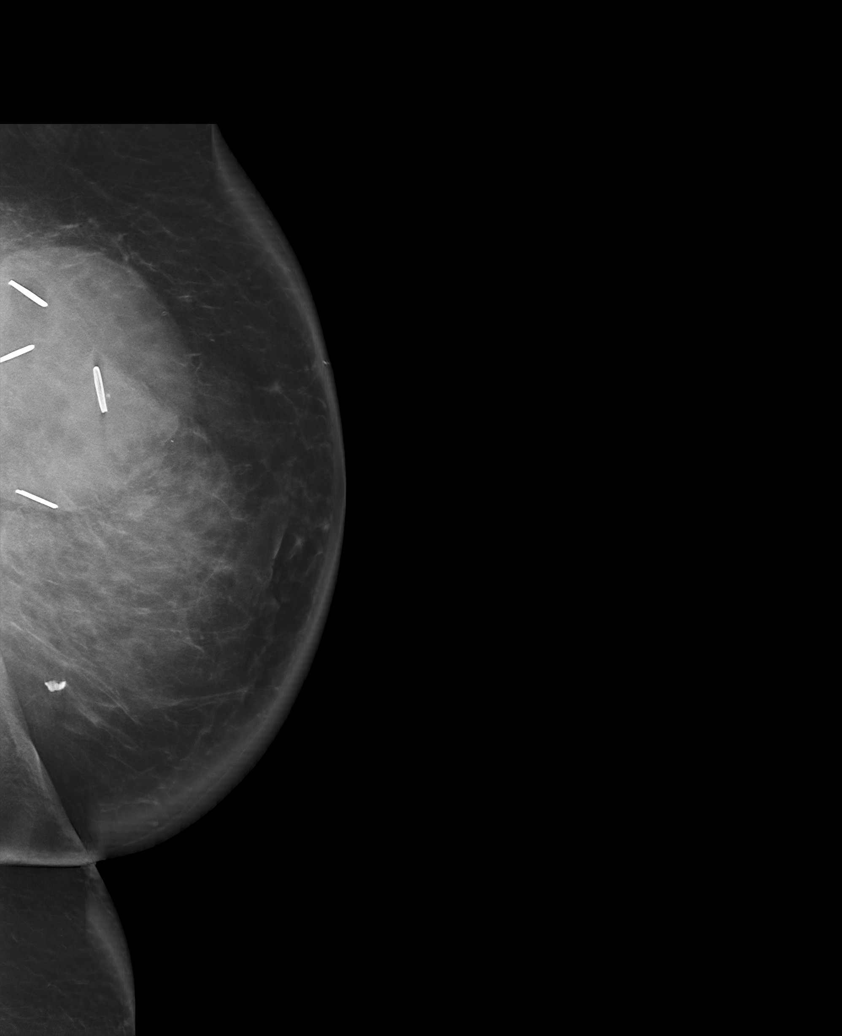

[R MLO synth-2D]
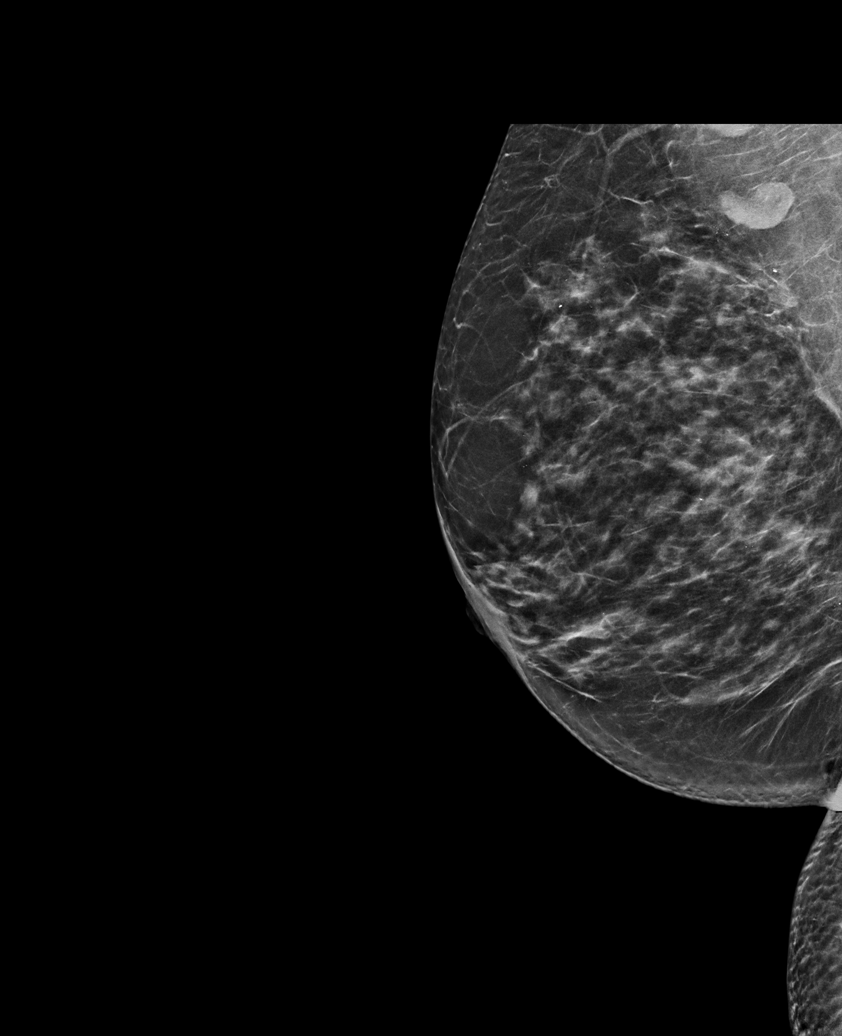

[L CC synth-2D]
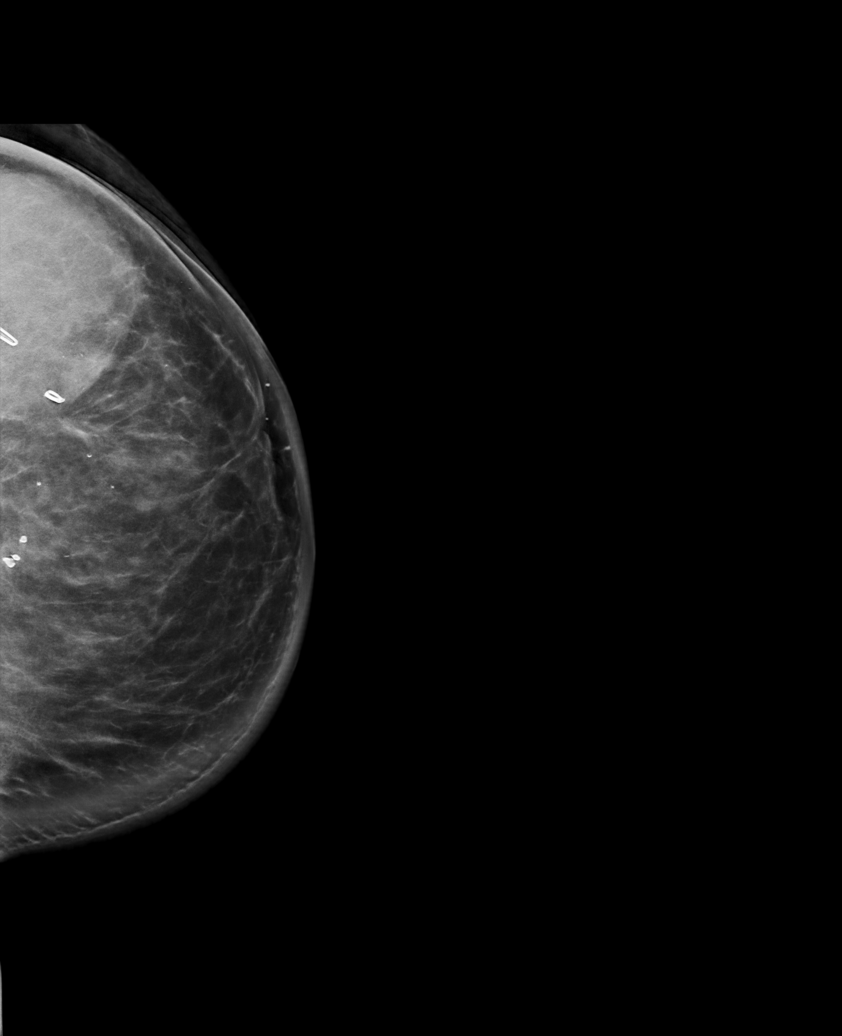

[R CC tomo · tomo slice 35/69.0]
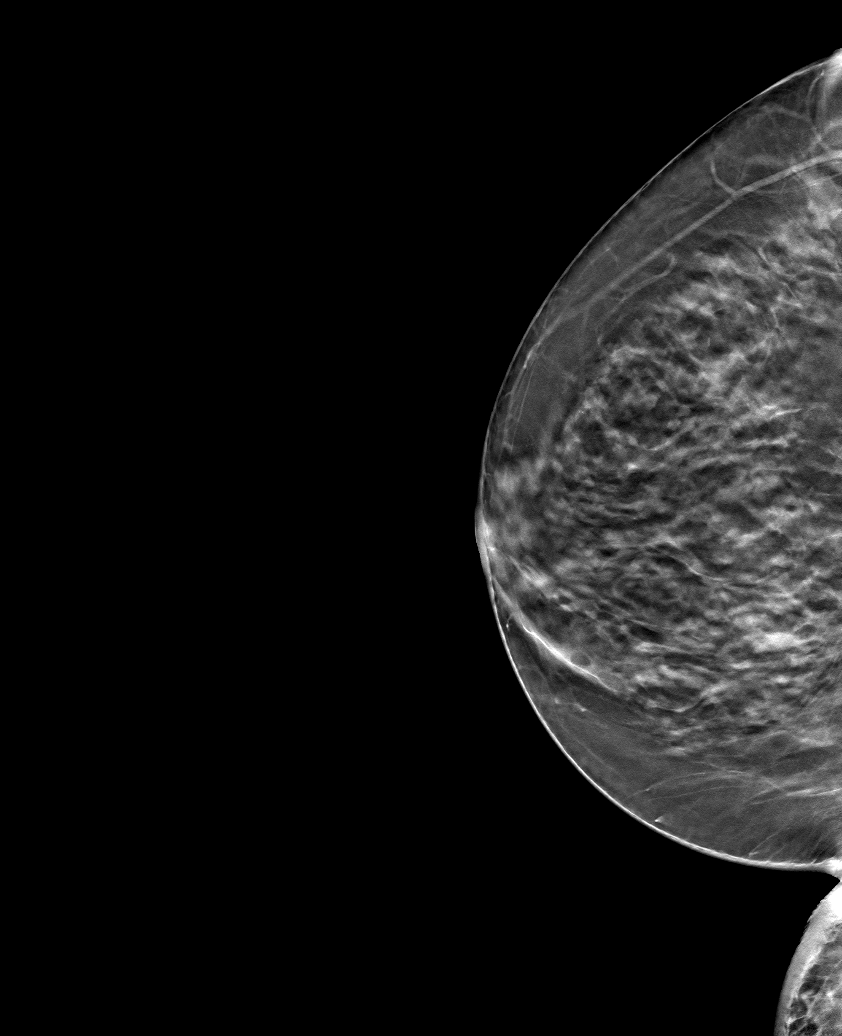

[6 of 25 positions shown; findings below may reference images not displayed]

ACR Breast Density Category c: The breast tissue is heterogeneously
dense, which may obscure small masses.
FINDINGS: Examination demonstrates post lumpectomy changes over the posterior
third of the upper-outer quadrant of the left breast. There is a
cm partially circumscribed and partially ill-defined oval mass over
the lumpectomy site. Mild increased parenchymal markings of the left
breast which may be due to patient's previous radiation versus
underlying infection. Remainder of the left breast as well as the
right breast is unchanged.

Mammographic images were processed with CAD.

On physical exam, there is mild to moderate erythema over the outer
half of the left breast with a large firm palpable mass over the
outer left breast from the 1 o'clock to the 5 o'clock position
measuring approximately 10 x 10 cm.

Targeted ultrasound is performed, showing a large complicated fluid
collection over the outer left breast from the 1 o'clock to the 5
o'clock positions 2 cm to 12 cm from the nipple over the lumpectomy
site. This measures approximately 3.3 x 6.5 x 6.6 cm, although is
too large to image completely within the field of view. This is
compatible with a complicated noninfected versus infected
postoperative fluid collection.
IMPRESSION: Post lumpectomy changes over the upper-outer quadrant of the left
breast. Large 6.6 cm complicated fluid collection at the lumpectomy
site in this patient with clinical presentation of left breast
infection. This is compatible with an infectious versus
noninfectious postoperative fluid collection.

RECOMMENDATION:
Recommend attempted aspiration of this complicated left lumpectomy
site fluid collection and completion of patient's antibiotic course.
Otherwise, recommend continued annual bilateral diagnostic
mammographic evaluation.

I have discussed the findings and recommendations with the patient.
If applicable, a reminder letter will be sent to the patient
regarding the next appointment.

BI-RADS CATEGORY  2: Benign.

## 2020-04-26 ENCOUNTER — Ambulatory Visit
Admission: RE | Admit: 2020-04-26 | Discharge: 2020-04-26 | Disposition: A | Discharge status: Home / Self Care | Payer: Medicare Other | Source: Ambulatory Visit | Attending: Adult Health | Admitting: Adult Health

## 2020-04-26 ENCOUNTER — Other Ambulatory Visit: Payer: Self-pay

## 2020-04-26 DIAGNOSIS — C50412 Malignant neoplasm of upper-outer quadrant of left female breast: Secondary | ICD-10-CM

## 2020-04-26 DIAGNOSIS — Z170 Estrogen receptor positive status [ER+]: Secondary | ICD-10-CM

## 2020-04-26 DIAGNOSIS — R922 Inconclusive mammogram: Secondary | ICD-10-CM | POA: Diagnosis not present

## 2020-04-26 HISTORY — DX: Personal history of irradiation: Z92.3

## 2020-04-26 HISTORY — DX: Personal history of antineoplastic chemotherapy: Z92.21

## 2020-04-27 DIAGNOSIS — Z23 Encounter for immunization: Secondary | ICD-10-CM | POA: Diagnosis not present

## 2020-04-27 DIAGNOSIS — I10 Essential (primary) hypertension: Secondary | ICD-10-CM | POA: Diagnosis not present

## 2020-04-27 DIAGNOSIS — R7303 Prediabetes: Secondary | ICD-10-CM | POA: Diagnosis not present

## 2020-04-27 DIAGNOSIS — C50912 Malignant neoplasm of unspecified site of left female breast: Secondary | ICD-10-CM | POA: Diagnosis not present

## 2020-04-27 DIAGNOSIS — E7800 Pure hypercholesterolemia, unspecified: Secondary | ICD-10-CM | POA: Diagnosis not present

## 2020-05-30 DIAGNOSIS — N181 Chronic kidney disease, stage 1: Secondary | ICD-10-CM | POA: Diagnosis not present

## 2020-06-07 DIAGNOSIS — Z961 Presence of intraocular lens: Secondary | ICD-10-CM | POA: Diagnosis not present

## 2020-06-07 DIAGNOSIS — H524 Presbyopia: Secondary | ICD-10-CM | POA: Diagnosis not present

## 2020-06-15 ENCOUNTER — Telehealth: Payer: Self-pay | Admitting: *Deleted

## 2020-06-15 NOTE — Telephone Encounter (Signed)
Received call from pt with complaint of red raised bump on left breast. Pt denies swelling, drainage or recent injury//trauma. Pt states she has a hx of infected boils and would like to be seen by MD. MD out of office, pt educated to call PCP to see if she can be seen today for further evaluation and tx.  Pt verbalized understanding and will contact our office if she is not able to be seen.

## 2020-06-25 ENCOUNTER — Telehealth: Payer: Self-pay | Admitting: Oncology

## 2020-06-25 NOTE — Telephone Encounter (Signed)
Scheduled appt per 4/28 sch msg. Pt aware.  

## 2020-07-18 ENCOUNTER — Inpatient Hospital Stay: Payer: Medicare Other | Admitting: Oncology

## 2020-07-20 ENCOUNTER — Ambulatory Visit
Admission: EM | Admit: 2020-07-20 | Discharge: 2020-07-20 | Disposition: A | Discharge status: Home / Self Care | Payer: Medicare Other | Attending: Emergency Medicine | Admitting: Emergency Medicine

## 2020-07-20 ENCOUNTER — Other Ambulatory Visit: Payer: Self-pay

## 2020-07-20 DIAGNOSIS — R42 Dizziness and giddiness: Secondary | ICD-10-CM

## 2020-07-20 MED ORDER — MECLIZINE HCL 12.5 MG PO TABS: 12.5000 mg | ORAL_TABLET | Freq: Three times a day (TID) | ORAL | 0 refills | Status: AC | PRN

## 2020-07-20 MED ORDER — ONDANSETRON 4 MG PO TBDP: 4.0000 mg | ORAL_TABLET | Freq: Three times a day (TID) | ORAL | 0 refills | Status: DC | PRN

## 2020-07-20 NOTE — ED Provider Notes (Signed)
[name]    CSN: [id number] Arrival date & time: 07/20/20  1106      History   Chief Complaint Chief Complaint  Patient presents with  . Dizziness  . Emesis    HPI Emily Chambers is a 70 y.o. female history of breast cancer, hypertension, presenting today for evaluation of dizziness and nausea.  Reports woke up in the middle of the night with episode of spinning which has persisted since.  Reports associated nausea.  Does report symptoms feel worse with changing position/standing.  Denies any associated headache or vision changes.  Denies any abdominal pain or diarrhea.  Denies recent URI symptoms, reports mild congestion related to allergies, but no significant URI symptoms than normal.  She does report history of vertigo, but the symptoms today are worse than normal.  Denies associated chest pain or shortness of breath.  Denies history of smoking or diabetes.  Does report history of hypertension.  Denies any changes in diet, medicines or foods recently.  HPI  Past Medical History:  Diagnosis Date  . Cancer [facility])    left breast cancer  . Family history of breast cancer   . Family history of lung cancer   . Family history of throat cancer   . High cholesterol   . Hypertension   . Kidney stone   . Kidney stones   . Personal history of chemotherapy   . Personal history of radiation therapy   . UTI (lower urinary tract infection)     Patient Active Problem List   Diagnosis Date Noted  . Genetic testing 03/24/2018  . Port-A-Cath in place 03/17/2018  . Family history of breast cancer   . Family history of throat cancer   . Family history of lung cancer   . Malignant neoplasm of lower-outer quadrant of left breast of female, estrogen receptor negative ([place]) 02/26/2018  . Malignant neoplasm of upper-outer quadrant of left breast in female, estrogen receptor positive ([place]) 02/26/2018  . Essential hypertension 10/23/2014  . Hyperlipidemia 10/23/2014    Past  Surgical History:  Procedure Laterality Date  . BREAST CYST ASPIRATION Left 04/21/2019   seroma  . BREAST EXCISIONAL BIOPSY Left 1999,2005   cysts removed  . BREAST LUMPECTOMY Left 2020  . BREAST LUMPECTOMY WITH RADIOACTIVE SEED AND SENTINEL LYMPH NODE BIOPSY Left 09/01/2018   Procedure: LEFT BREAST  RADIOACTIVE SEED LUMPECTOMY X2 AND LEFT SENTINEL LYMPH NODE BIOPSY AND MAPPING;  Surgeon: Stark Klein, MD;  Location: [facility];  Service: General;  Laterality: Left;  . BREAST SURGERY Left    cyst and biopsy  . PORT-A-CATH REMOVAL Left 01/06/2019   Procedure: REMOVAL PORT-A-CATH;  Surgeon: Stark Klein, MD;  Location: [place];  Service: General;  Laterality: Left;  . PORTACATH PLACEMENT Left 03/10/2018   Procedure: INSERTION PORT-A-CATH;  Surgeon: Stark Klein, MD;  Location: [place];  Service: General;  Laterality: Left;  . RADIOACTIVE SEED GUIDED EXCISIONAL BREAST BIOPSY Right 09/01/2018   Procedure: RADIOACTIVE SEED GUIDED EXCISIONAL RIGHT BREAST BIOPSY;  Surgeon: Stark Klein, MD;  Location: [place];  Service: General;  Laterality: Right;    OB History   No obstetric history on file.      Home Medications    Prior to Admission medications   Medication Sig Start Date End Date Taking? Authorizing Provider  meclizine (ANTIVERT) 12.5 MG tablet Take 1-2 tablets (12.5-25 mg total) by mouth 3 (three) times daily as needed for dizziness. 07/20/20  Yes Sintia Mckissic C, PA-C  ondansetron (ZOFRAN ODT) 4 MG disintegrating tablet Take 1 tablet (4 mg total) by mouth every 8 (eight) hours as needed for nausea or vomiting. 07/20/20  Yes Breann Losano C, PA-C  amLODipine (NORVASC) 10 MG tablet Take 10 mg by mouth every morning.     [provider]  anastrozole (ARIMIDEX) 1 MG tablet Take 1 tablet (1 mg total) by mouth daily. 03/19/20   Magrinat, Virgie Dad, MD  aspirin 81 MG chewable tablet Chew 81 mg by mouth daily.     [provider]  dextromethorphan-guaiFENesin (MUCINEX DM) 30-600 MG 12hr tablet Take 1 tablet by mouth 2 (two) times daily. 02/04/20   Joeli Fenner C, PA-C  fluticasone (FLONASE) 50 MCG/ACT nasal spray Place 1-2 sprays into both nostrils daily. 02/04/20   Kerri-Anne Haeberle C, PA-C  losartan (COZAAR) 100 MG tablet Take 100 mg by mouth every morning.     [provider]  pravastatin (PRAVACHOL) 20 MG tablet Take 20 mg by mouth every morning.     [provider]    Family History Family History  Problem Relation Age of Onset  . Hypertension Mother   . Heart disease Mother   . Dementia Mother   . Healthy Father   . Colon cancer Maternal Grandmother   . Cancer Paternal Grandmother        unk type  . Lung cancer Cousin        82s  . Breast cancer Cousin        49s  . Breast cancer Cousin        28s  . Breast cancer Cousin   . Cancer Paternal Aunt        unk type d. 32, possibly pancreatic  . Cancer Paternal Aunt        unk type d. 65s  . Cancer Paternal Uncle        unk type   . Cancer Paternal Uncle        unk type d. 53s  . Cancer Maternal Aunt        unk type, d. 40s  . Throat cancer Maternal Uncle        d. 56s  . Ovarian cancer Neg Hx     Social History Social History   Tobacco Use  . Smoking status: Never Smoker  . Smokeless tobacco: Never Used  Substance Use Topics  . Alcohol use: Yes    Comment: seldom  . Drug use: No     Allergies   Patient has no known allergies.   Review of Systems Review of Systems  Constitutional: Negative for fatigue and fever.  HENT: Negative for congestion, sinus pressure and sore throat.   Eyes: Negative for photophobia, pain and visual disturbance.  Respiratory: Negative for cough and shortness of breath.   Cardiovascular: Negative for chest pain.  Gastrointestinal: Positive for nausea. Negative for abdominal pain and vomiting.  Genitourinary: Negative for decreased urine volume and hematuria.   Musculoskeletal: Negative for myalgias, neck pain and neck stiffness.  Neurological: Positive for dizziness. Negative for syncope, facial asymmetry, speech difficulty, weakness, light-headedness, numbness and headaches.     Physical Exam Triage Vital Signs ED Triage Vitals  Enc Vitals Group     BP      Pulse      Resp      Temp      Temp src      SpO2      Weight  Height      Head Circumference      Peak Flow      Pain Score      Pain Loc      Pain Edu?      Excl. in [place]?    Orthostatic VS for the past 24 hrs:  BP- Lying Pulse- Lying BP- Sitting Pulse- Sitting BP- Standing at 0 minutes Pulse- Standing at 0 minutes  07/20/20 1336 134/81 71 142/89 78 (!) 150/98 84    Updated Vital Signs BP (!) 143/82 (BP Location: Right Arm)   Pulse 68   Temp 97.8 F (36.6 C) (Oral)   Resp 16   SpO2 94%   Visual Acuity Right Eye Distance:   Left Eye Distance:   Bilateral Distance:    Right Eye Near:   Left Eye Near:    Bilateral Near:     Physical Exam Vitals and nursing note reviewed.  Constitutional:      Appearance: She is well-developed.     Comments: No acute distress  HENT:     Head: Normocephalic and atraumatic.     Ears:     Comments: Bilateral ears without tenderness to palpation of external auricle, tragus and mastoid, EAC's without erythema or swelling, TM's with good bony landmarks and cone of light. Non erythematous.     Nose: Nose normal.     Mouth/Throat:     Comments: Oral mucosa pink and moist, no tonsillar enlargement or exudate. Posterior pharynx patent and nonerythematous, no uvula deviation or swelling. Normal phonation. Eyes:     Conjunctiva/sclera: Conjunctivae normal.  Cardiovascular:     Rate and Rhythm: Normal rate.  Pulmonary:     Effort: Pulmonary effort is normal. No respiratory distress.     Comments: Breathing comfortably at rest, CTABL, no wheezing, rales or other adventitious sounds auscultated Abdominal:     General: There is no  distension.  Musculoskeletal:        General: Normal range of motion.     Cervical back: Neck supple.  Skin:    General: Skin is warm and dry.  Neurological:     General: No focal deficit present.     Mental Status: She is alert and oriented to person, place, and time. Mental status is at baseline.     Comments: Patient A&O x3, cranial nerves II-XII grossly intact, strength at shoulders, hips and knees 5/5, equal bilaterally.  Negative Romberg and Pronator Drift. Gait without abnormality.      UC Treatments / Results  Labs (all labs ordered are listed, but only abnormal results are displayed) Labs Reviewed  BASIC METABOLIC PANEL  CBC    EKG   Radiology No results found.  Procedures Procedures (including critical care time)  Medications Ordered in UC Medications - No data to display  Initial Impression / Assessment and Plan / UC Course  I have reviewed the triage vital signs and the nursing notes.  Pertinent labs & imaging results that were available during my care of the patient were reviewed by me and considered in my medical decision making (see chart for details).     Dizziness-EKG normal sinus rhythm, no acute signs of ischemia or infarction, negative orthostatics, no neurodeficit on exam, symptoms most suggestive of BPPV and recommending meclizine, Zofran, Epley maneuver and fluids with close monitoring.  Checking blood work to ensure electrolytes, hemoglobin stable.  Will call if abnormal.  Advised patient to continue to monitor symptoms, discussed warning signs of CVA to follow-up in  emergency room for CT.  Discussed strict return precautions. Patient verbalized understanding and is agreeable with plan.  Final Clinical Impressions(s) / UC Diagnoses   Final diagnoses:  Dizziness     Discharge Instructions     EKG normal Blood pending- I will only call if abnormal I suspect your symptoms are related to vertigo-read attached Use meclizine as needed for  dizziness May Zofran for further relief of nausea/vomiting Drink plenty of fluids Try performing Epley maneuver at home-may look up YouTube video for demonstration  Please follow-up if not improving or worsening    ED Prescriptions    Medication Sig Dispense Auth. Provider   meclizine (ANTIVERT) 12.5 MG tablet Take 1-2 tablets (12.5-25 mg total) by mouth 3 (three) times daily as needed for dizziness. 30 tablet Yoshimi Sarr C, PA-C   ondansetron (ZOFRAN ODT) 4 MG disintegrating tablet Take 1 tablet (4 mg total) by mouth every 8 (eight) hours as needed for nausea or vomiting. 20 tablet Leonard Feigel, [place] C, PA-C     PDMP not reviewed this encounter.   Elana Jian, [place] C, PA-C 07/20/20 1400

## 2020-07-20 NOTE — ED Triage Notes (Signed)
Pt present dizziness and nausea with vomiting. Symptom started last night. Pt states she woke up in the middle of the night with her head spinning.

## 2020-07-20 NOTE — Discharge Instructions (Addendum)
EKG normal Blood pending- I will only call if abnormal I suspect your symptoms are related to vertigo-read attached Use meclizine as needed for dizziness May Zofran for further relief of nausea/vomiting Drink plenty of fluids Try performing Epley maneuver at home-may look up YouTube video for demonstration  Please follow-up if not improving or worsening

## 2020-07-21 LAB — BASIC METABOLIC PANEL
BUN/Creatinine Ratio: 19 (ref 12–28)
BUN: 12 mg/dL (ref 8–27)
CO2: 23 mmol/L (ref 20–29)
Calcium: 10.8 mg/dL — ABNORMAL HIGH (ref 8.7–10.3)
Chloride: 102 mmol/L (ref 96–106)
Creatinine, Ser: 0.63 mg/dL (ref 0.57–1.00)
Glucose: 100 mg/dL — ABNORMAL HIGH (ref 65–99)
Potassium: 4 mmol/L (ref 3.5–5.2)
Sodium: 141 mmol/L (ref 134–144)
eGFR: 96 mL/min/{1.73_m2} (ref >59)

## 2020-07-21 LAB — CBC
Hematocrit: 39.4 % (ref 34.0–46.6)
Hemoglobin: 12.3 g/dL (ref 11.1–15.9)
MCH: 26.9 pg (ref 26.6–33.0)
MCHC: 31.2 g/dL — ABNORMAL LOW (ref 31.5–35.7)
MCV: 86 fL (ref 79–97)
Platelets: 209 10*3/uL (ref 150–450)
RBC: 4.58 x10E6/uL (ref 3.77–5.28)
RDW: 12.5 % (ref 11.7–15.4)
WBC: 9 10*3/uL (ref 3.4–10.8)

## 2020-09-20 ENCOUNTER — Inpatient Hospital Stay: Payer: Medicare Other

## 2020-09-20 ENCOUNTER — Inpatient Hospital Stay: Payer: Medicare Other | Admitting: Oncology

## 2020-09-20 NOTE — Progress Notes (Deleted)
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   She takes anastrozole daily.  She obtains it at a very low price, actually the most recent prescription she got for free.  She has very few if any hot flashes which are mild.  She has no vaginal dryness issues.  Her most recent bone density was completed on 07/01/2019 and showed normal bone density.    She is scheduled for routine mammography on 04/26/2020.   REVIEW OF SYSTEMS: Emily Chambers has some aches and pains here and there which she attributes I think correctly to arthritis.  The holidays were quiet.  She continues to work full-time.  A detailed review of systems today was otherwise stable.   COVID 19 VACCINATION STATUS: Status post Pfizer x2 with booster October 2021   HISTORY OF CURRENT ILLNESS: From the original intake note:  Emily Chambers had routine screening mammography on 02/10/2018 showing a possible abnormality in the left breast. She underwent bilateral diagnostic mammography with tomography and left breast ultrasonography at [facility] on 02/16/2018 showing: breast density category C; two left breast masses, one at 2 o'clock and the other at 3:30 o'clock. The 2 o'clock mass (1.3 x 1 x 1 cm) corresponds to the mammographic abnormality and  is highly suspicious for breast carcinoma. The other mass (1.2 x 0.7 x 1.2 cm)  is suspicious for breast carcinoma. No left axillary adenopathy.   Accordingly on 02/19/2018 she proceeded to biopsy of the left breast area in question. 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 Prognostic indicators significant for: estrogen receptor, 90% positive and progesterone receptor, 95% positive, both with strong staining intensity. Proliferation marker Ki67 at 5%. HER2 negative (1+).   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 Prognostic indicators significant for: estrogen receptor, 10% positive with moderate staining intensity and progesterone receptor, 0% negative. Proliferation marker Ki67 at 40%. HER2 negative (1+).   The patient's subsequent history is as detailed below.   PAST MEDICAL HISTORY: Past Medical History:  Diagnosis Date   Cancer ([place])    left breast cancer   Family history of breast cancer    Family history of lung cancer    Family history of throat cancer    High cholesterol    Hypertension    Kidney stone    Kidney stones    Personal history of chemotherapy    Personal history of radiation therapy    UTI (lower urinary tract infection)     PAST SURGICAL HISTORY: Past Surgical History:  Procedure Laterality Date   BREAST CYST ASPIRATION Left 04/21/2019   seroma   BREAST EXCISIONAL BIOPSY Left 5726,2035   cysts removed   BREAST LUMPECTOMY Left 2020   BREAST LUMPECTOMY WITH RADIOACTIVE SEED AND SENTINEL LYMPH NODE BIOPSY Left 09/01/2018   Procedure: LEFT BREAST  RADIOACTIVE SEED LUMPECTOMY X2 AND LEFT SENTINEL LYMPH NODE BIOPSY AND MAPPING;  Surgeon: Stark Klein, MD;  Location: [facility];  Service: General;  Laterality: Left;   BREAST SURGERY Left    cyst and biopsy   PORT-A-CATH REMOVAL Left  01/06/2019   Procedure: REMOVAL PORT-A-CATH;  Surgeon: Stark Klein, MD;  Location: [place];  Service: General;  Laterality: Left;   PORTACATH PLACEMENT Left 03/10/2018   Procedure: INSERTION PORT-A-CATH;  Surgeon: Stark Klein, MD;  Location: [facility];  Service: General;  Laterality: Left;   RADIOACTIVE SEED GUIDED EXCISIONAL BREAST BIOPSY Right 09/01/2018   Procedure: RADIOACTIVE SEED GUIDED EXCISIONAL RIGHT BREAST BIOPSY;  Surgeon: Stark Klein, MD;  Location: [facility];  Service: General;  Laterality: Right;    FAMILY HISTORY: Family History  Problem Relation Age of Onset   Hypertension Mother    Heart disease Mother    Dementia Mother    Healthy Father    Colon cancer Maternal Grandmother    Cancer Paternal Grandmother        unk type   Lung cancer Cousin        48s   Breast cancer Cousin        60s   Breast cancer Cousin        15s   Breast cancer Cousin    Cancer Paternal Aunt        unk type d. 40, possibly pancreatic   Cancer Paternal Aunt        unk type d. 71s   Cancer Paternal Uncle        unk type    Cancer Paternal Uncle        unk type d. 61s   Cancer Maternal Aunt        unk type, d. 24s   Throat cancer Maternal Uncle        d. 34s   Ovarian cancer Neg Hx   Patient father is alive at 39 years old. Patient mother died from heart disease and dementia at age 55.  The patient denies a family hx of ovarian cancer. She has 3 siblings, 1 brother and 2 sisters. She has a maternal cousin diagnosed with lung cancer in her 13s. She has 3 paternal cousins with breast cancer, one was diagnosed in her 95s and has passed away.   GYNECOLOGIC HISTORY:  No LMP recorded. Patient is postmenopausal. Menarche: 70 years old Age at first live birth: n/a GX P 0 LMP 2010 Contraceptive n/a HRT no  Hysterectomy? no BSo? no   SOCIAL HISTORY:  She is single and works as a Scientist, water quality at [employer]). She lives alone, with no pets.    ADVANCED DIRECTIVES: Not in place.  At the 03/03/2018 visit she was given the appropriate documents to complete and notarize at her discretion.  She is planning to name her niece, [name], as her HCPOA.   HEALTH MAINTENANCE: Social History   Tobacco Use   Smoking status: Never   Smokeless tobacco: Never  Substance Use Topics   Alcohol use: Yes    Comment: seldom   Drug use: No     Colonoscopy: 2014? [facility]  PAP: 02/02/2018  Bone density: 03/15/2016, T-score 0.4, Dr. Alden Hipp   No Known Allergies  Current Outpatient Medications  Medication Sig Dispense Refill   amLODipine (NORVASC) 10 MG tablet Take 10 mg by mouth every morning.      anastrozole (ARIMIDEX)  1 MG tablet Take 1 tablet (1 mg total) by mouth daily. 90 tablet 4   aspirin 81 MG chewable tablet Chew 81 mg by mouth daily.     dextromethorphan-guaiFENesin (MUCINEX DM) 30-600 MG 12hr tablet Take 1 tablet by mouth 2 (two) times daily. 20 tablet 0   fluticasone (FLONASE) 50 MCG/ACT nasal spray Place 1-2 sprays into both nostrils daily. 16 g 0   losartan (COZAAR) 100 MG tablet Take 100 mg by mouth every morning.      meclizine (ANTIVERT) 12.5 MG tablet Take 1-2 tablets (12.5-25 mg total) by mouth 3 (three) times daily as needed for dizziness. 30 tablet 0   ondansetron (ZOFRAN ODT) 4 MG disintegrating tablet Take 1 tablet (4 mg total) by mouth every 8 (eight) hours as needed for nausea or vomiting. 20 tablet 0   pravastatin (PRAVACHOL) 20 MG tablet Take 20 mg by mouth every morning.      No current facility-administered medications for this visit.    OBJECTIVE: African-American woman who appears younger than stated age  There were no vitals filed for this visit.  Wt Readings from Last 3 Encounters:  03/19/20 179 lb 1.6 oz (81.2 kg)  09/20/19 181 lb 4.8 oz (82.2 kg)  04/29/19 177 lb 6.4 oz (80.5 kg)   There is no height or weight on file to calculate BMI.     Sclerae unicteric, EOMs intact Wearing a mask No cervical or supraclavicular adenopathy Lungs no rales or rhonchi Heart regular rate and rhythm Abd soft, nontender, positive bowel sounds MSK no focal spinal tenderness, no upper extremity lymphedema Neuro: nonfocal, well oriented, appropriate affect Breasts: Right breast is benign.  The left breast is status post lumpectomy and radiation.  There is no evidence of local recurrence.  Both axillae are benign   LAB RESULTS:  CMP     Component Value Date/Time   NA 141 07/20/2020 1352   K 4.0 07/20/2020 1352   CL 102 07/20/2020 1352   CO2 23 07/20/2020 1352   GLUCOSE 100 (H) 07/20/2020 1352   GLUCOSE 97 12/21/2019 1358   BUN 12 07/20/2020 1352   CREATININE 0.63 07/20/2020 1352   CREATININE 0.77 12/21/2019 1358   CALCIUM 10.8 (H) 07/20/2020 1352   PROT 7.2 12/21/2019 1358   ALBUMIN 3.9 12/21/2019 1358   AST 17 12/21/2019 1358   ALT 25 12/21/2019 1358   ALKPHOS 108 12/21/2019 1358   BILITOT 0.3 12/21/2019 1358   GFRNONAA >60 12/21/2019 1358   GFRAA >60 09/20/2019 1341    No results found for: TOTALPROTELP, ALBUMINELP, A1GS, A2GS, BETS, BETA2SER, GAMS, MSPIKE, SPEI  No results found for: KPAFRELGTCHN, LAMBDASER, KAPLAMBRATIO  Lab Results  Component Value Date   WBC 9.0 07/20/2020   NEUTROABS 4.3 12/21/2019   HGB 12.3 07/20/2020   HCT 39.4 07/20/2020   MCV 86 07/20/2020   PLT 209 07/20/2020   No results found for: LABCA2  No components found for: ZOXWRU045  No results for input(s): INR in the last 168 hours.  No results found for: LABCA2  No results found for: WUJ811  No results found for: BJY782  No results found for: NFA213  No results found for: CA2729  No components found for: HGQUANT  No results found for: CEA1 / No results found for: CEA1   No results found for: AFPTUMOR  No results found for: Angelina  No visits with results within 3 Day(s) from this visit.  Latest known visit with results  is:  Admission on 07/20/2020,  Discharged on 07/20/2020  Component Date Value Ref Range Status   Glucose 07/20/2020 100 (A) 65 - 99 mg/dL Final   BUN 07/20/2020 12  8 - 27 mg/dL Final   Creatinine, Ser 07/20/2020 0.63  0.57 - 1.00 mg/dL Final   eGFR 07/20/2020 96  >59 mL/min/1.73 Final   BUN/Creatinine Ratio 07/20/2020 19  12 - 28 Final   Sodium 07/20/2020 141  134 - 144 mmol/L Final   Potassium 07/20/2020 4.0  3.5 - 5.2 mmol/L Final   Chloride 07/20/2020 102  96 - 106 mmol/L Final   CO2 07/20/2020 23  20 - 29 mmol/L Final   Calcium 07/20/2020 10.8 (A) 8.7 - 10.3 mg/dL Final   WBC 07/20/2020 9.0  3.4 - 10.8 x10E3/uL Final   RBC 07/20/2020 4.58  3.77 - 5.28 x10E6/uL Final   Hemoglobin 07/20/2020 12.3  11.1 - 15.9 g/dL Final   Hematocrit 07/20/2020 39.4  34.0 - 46.6 % Final   MCV 07/20/2020 86  79 - 97 fL Final   MCH 07/20/2020 26.9  26.6 - 33.0 pg Final   MCHC 07/20/2020 31.2 (A) 31.5 - 35.7 g/dL Final   RDW 07/20/2020 12.5  11.7 - 15.4 % Final   Platelets 07/20/2020 209  150 - 450 x10E3/uL Final    No results found for: HGBA, HGBA2QUANT, HGBFQUANT, HGBSQUAN (Hemoglobinopathy evaluation)   No results found for: LDH  No results found for: IRON, TIBC, IRONPCTSAT (Iron and TIBC)  No results found for: FERRITIN  Urinalysis    Component Value Date/Time   COLORURINE YELLOW 04/12/2018 1504   APPEARANCEUR CLEAR 04/12/2018 1504   LABSPEC 1.020 04/12/2018 1504   PHURINE 5.0 04/12/2018 1504   GLUCOSEU NEGATIVE 04/12/2018 1504   HGBUR NEGATIVE 04/12/2018 1504   BILIRUBINUR NEGATIVE 04/12/2018 1504   KETONESUR NEGATIVE 04/12/2018 1504   PROTEINUR NEGATIVE 04/12/2018 1504   UROBILINOGEN 0.2 07/07/2012 1159   NITRITE NEGATIVE 04/12/2018 1504   LEUKOCYTESUR TRACE (A) 04/12/2018 1504    STUDIES: No results found.   ELIGIBLE FOR AVAILABLE RESEARCH PROTOCOL: Upbeat   ASSESSMENT: 70 y.o. Emily Chambers woman status post left breast biopsy x2 on 02/19/2018, showing  (a) in the upper  outer quadrant, a clinical T1c N0, stage IA invasive carcinoma, likely lobular, grade 2, estrogen and progesterone receptor positive, HER-2 not amplified, with an MIB-1 of 5%  (b) in the lower outer quadrant a clinical T1c N0, stage IA-B invasive ductal carcinoma, grade 2, estrogen receptor only moderately positive at 10%, progesterone receptor negative, with an MIB-1 of 40%, and HER-2 not amplified  (1) neoadjuvant chemotherapy will consist of doxorubicin and cyclophosphamide in dose dense fashion x4 starting 03/16/2018, 08/14/2018 04/27/2018, followed by paclitaxel and carboplatin weekly x12 starting 05/11/2018, completed 07/27/2018  (a) echo on 03/11/2018 shows well preserved EF of 60-65%  (2) status post left lumpectomy and sentinel lymph node sampling 09/01/2018 showing  (a) invasive ductal carcinoma, grade 2, ypT1b ypN0, triple negative  (b) invasive lobular carcinoma, grade 2, ypT1c ypN0, estrogen and progesterone receptor positive, HER-2 not amplified  (3) adjuvant radiation 10/04/2018 through 11/02/2018 Site Technique Total Dose Dose per Fx Completed Fx Beam Energies  Breast: Breast_Lt 3D 40.05/40.05 2.67 15/15 6X, 10X  Breast: Breast_Lt_Bst 3D 12/12 2 6/6 6X, 10X    (4) anastrozole (for upper outer quadrant tumor) started 12/06/2018  (a) bone density 03/25/2016 at the breast center showed a T score of 0.4 (normal).  (b) bone density 06/2019 was normal  (5) genetics testing on 03/24/2018 showed no pathogenic mutations.  Genes tested include:  APC, ATM, AXIN2, BARD1, BMPR1A, BRCA1, BRCA2, BRIP1, CDH1, CDKN2A (p14ARF), CDKN2A (p16INK4a), CKD4, CHEK2, CTNNA1, DICER1, EPCAM (Deletion/duplication testing only), GREM1 (promoter region deletion/duplication testing only), KIT, MEN1, MLH1, MSH2, MSH3, MSH6, MUTYH, NBN, NF1, NHTL1, PALB2, PDGFRA, PMS2, POLD1, POLE, PTEN, RAD50, RAD51C, RAD51D, SDHB, SDHC, SDHD, SMAD4, SMARCA4. STK11, TP53, TSC1, TSC2, and VHL.  The following genes were evaluated for  sequence changes only: SDHA and HOXB13 c.251G>A variant only.   PLAN: Anaiah is now 1-1/2 years out from definitive surgery for breast cancer with no evidence of disease recurrence.  This is favorable.  She is tolerating anastrozole well and the plan will be to continue that for a total of 5 years  She will have mammography in March and she will see me in 6 months.  She knows to call for any other issue that may develop before then  Total encounter time 25 minutes.Sarajane Jews C. Jaqwon Manfred, MD 09/20/20 9:52 AM Medical Oncology and Hematology [facility] [place], [place] [address] Tel. [phone]    Fax. [phone]   I, Wilburn Mylar, am acting as scribe for Dr. Virgie Dad. Rheya Minogue.  I, Lurline Del MD, have reviewed the above documentation for accuracy and completeness, and I agree with the above.    *Total Encounter Time as defined by the Centers for Medicare and Medicaid Services includes, in addition to the face-to-face time of a patient visit (documented in the note above) non-face-to-face time: obtaining and reviewing outside history, ordering and reviewing medications, tests or procedures, care coordination (communications with other health care professionals or caregivers) and documentation in the medical record.

## 2020-09-26 ENCOUNTER — Inpatient Hospital Stay: Payer: Medicare Other | Attending: Oncology

## 2020-09-26 ENCOUNTER — Inpatient Hospital Stay (HOSPITAL_BASED_OUTPATIENT_CLINIC_OR_DEPARTMENT_OTHER): Payer: Medicare Other | Admitting: Oncology

## 2020-09-26 ENCOUNTER — Other Ambulatory Visit: Payer: Self-pay

## 2020-09-26 VITALS — BP 130/65 | HR 90 | Temp 97.4°F | Resp 18 | Ht 62.0 in | Wt 185.4 lb

## 2020-09-26 DIAGNOSIS — C50512 Malignant neoplasm of lower-outer quadrant of left female breast: Secondary | ICD-10-CM

## 2020-09-26 DIAGNOSIS — Z79811 Long term (current) use of aromatase inhibitors: Secondary | ICD-10-CM | POA: Diagnosis not present

## 2020-09-26 DIAGNOSIS — Z170 Estrogen receptor positive status [ER+]: Secondary | ICD-10-CM | POA: Diagnosis not present

## 2020-09-26 DIAGNOSIS — Z171 Estrogen receptor negative status [ER-]: Secondary | ICD-10-CM

## 2020-09-26 DIAGNOSIS — C50412 Malignant neoplasm of upper-outer quadrant of left female breast: Secondary | ICD-10-CM | POA: Diagnosis not present

## 2020-09-26 DIAGNOSIS — Z923 Personal history of irradiation: Secondary | ICD-10-CM | POA: Diagnosis not present

## 2020-09-26 LAB — CBC WITH DIFFERENTIAL/PLATELET
Abs Immature Granulocytes: 0.05 10*3/uL (ref 0.00–0.07)
Basophils Absolute: 0.1 10*3/uL (ref 0.0–0.1)
Basophils Relative: 1 %
Eosinophils Absolute: 0.2 10*3/uL (ref 0.0–0.5)
Eosinophils Relative: 2 %
HCT: 34.8 % — ABNORMAL LOW (ref 36.0–46.0)
Hemoglobin: 10.8 g/dL — ABNORMAL LOW (ref 12.0–15.0)
Immature Granulocytes: 1 %
Lymphocytes Relative: 27 %
Lymphs Abs: 2.7 10*3/uL (ref 0.7–4.0)
MCH: 27.8 pg (ref 26.0–34.0)
MCHC: 31 g/dL (ref 30.0–36.0)
MCV: 89.5 fL (ref 80.0–100.0)
Monocytes Absolute: 0.6 10*3/uL (ref 0.1–1.0)
Monocytes Relative: 6 %
Neutro Abs: 6.3 10*3/uL (ref 1.7–7.7)
Neutrophils Relative %: 63 %
Platelets: 192 10*3/uL (ref 150–400)
RBC: 3.89 MIL/uL (ref 3.87–5.11)
RDW: 13.8 % (ref 11.5–15.5)
WBC: 10 10*3/uL (ref 4.0–10.5)
nRBC: 0 % (ref 0.0–0.2)

## 2020-09-26 LAB — COMPREHENSIVE METABOLIC PANEL
ALT: 22 U/L (ref 0–44)
AST: 16 U/L (ref 15–41)
Albumin: 4 g/dL (ref 3.5–5.0)
Alkaline Phosphatase: 111 U/L (ref 38–126)
Anion gap: 11 (ref 5–15)
BUN: 15 mg/dL (ref 8–23)
CO2: 23 mmol/L (ref 22–32)
Calcium: 10.4 mg/dL — ABNORMAL HIGH (ref 8.9–10.3)
Chloride: 109 mmol/L (ref 98–111)
Creatinine, Ser: 0.82 mg/dL (ref 0.44–1.00)
GFR, Estimated: >60 mL/min (ref >60)
Glucose, Bld: 172 mg/dL — ABNORMAL HIGH (ref 70–99)
Potassium: 3.3 mmol/L — ABNORMAL LOW (ref 3.5–5.1)
Sodium: 143 mmol/L (ref 135–145)
Total Bilirubin: 0.3 mg/dL (ref 0.3–1.2)
Total Protein: 7.6 g/dL (ref 6.5–8.1)

## 2020-09-26 MED ORDER — VITAMIN D 25 MCG (1000 UNIT) PO TABS: 1000.0000 [IU] | ORAL_TABLET | Freq: Every day | ORAL | 4 refills | Status: AC

## 2020-09-26 MED ORDER — ANASTROZOLE 1 MG PO TABS: 1.0000 mg | ORAL_TABLET | Freq: Every day | ORAL | 4 refills | Status: DC

## 2020-09-26 NOTE — Progress Notes (Signed)
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   She takes anastrozole daily.  She has no hot flashes and vaginal dryness is not an issue.  Her most recent bone density was completed on 07/01/2019 and showed normal bone density.    Since her last visit, she underwent bilateral diagnostic mammography with tomography at [facility] on 04/26/2020 showing: breast density category C; no evidence of malignancy in either breast.    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  She does not know how many steps she takes during the day but I showed her how to use the app and her phone if she wishes to find out.  She has a history of vertigo.  Whenever that happens she ends up in urgent care, gets put on meclizine, and has few difficult days.  I think she would benefit from referral to vestibular rehab and I am placing that today for her.  A detailed review of systems was otherwise stable   COVID 19 VACCINATION STATUS: Status post Pfizer x2 with booster October 2021   HISTORY OF CURRENT ILLNESS: From the original intake note:  Emily Chambers had routine screening mammography on  02/10/2018 showing a possible abnormality in the left breast. She underwent bilateral diagnostic mammography with tomography and left breast ultrasonography at [facility] on 02/16/2018 showing: breast density category C; two left breast masses, one at 2 o'clock and the other at 3:30 o'clock. The 2 o'clock mass (1.3 x 1 x 1 cm) corresponds to the mammographic abnormality and is highly suspicious for breast carcinoma. The other mass (1.2 x 0.7 x 1.2 cm)  is suspicious for breast carcinoma. No left axillary adenopathy.   Accordingly on 02/19/2018 she proceeded to biopsy of the left breast area in question. The pathology from this procedure ([id number]) showed:  1) 3:30 o'clock specimen showed invasive mammary carcinoma, possibly lobular (weak and atypical e-cadherin expression), grade 2. Prognostic indicators significant for: estrogen receptor, 90% positive and progesterone receptor, 95% positive, both with strong staining intensity. Proliferation marker Ki67 at 5%. HER2 negative (1+).   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 Prognostic indicators significant for: estrogen receptor, 10% positive with moderate staining intensity and progesterone receptor, 0% negative. Proliferation marker Ki67 at 40%. HER2 negative (1+).   The patient's subsequent history is as detailed below.   PAST MEDICAL HISTORY: Past Medical History:  Diagnosis Date   Cancer [facility])    left breast cancer   Family history of breast cancer    Family history of lung cancer    Family history of throat cancer    High cholesterol    Hypertension    Kidney stone    Kidney stones    Personal history of chemotherapy    Personal history of  radiation therapy    UTI (lower urinary tract infection)     PAST SURGICAL HISTORY: Past Surgical History:  Procedure Laterality Date   BREAST CYST ASPIRATION Left 04/21/2019   seroma   BREAST EXCISIONAL BIOPSY Left 9476,[id number]   cysts removed   BREAST  LUMPECTOMY Left 2020   BREAST LUMPECTOMY WITH RADIOACTIVE SEED AND SENTINEL LYMPH NODE BIOPSY Left 09/01/2018   Procedure: LEFT BREAST  RADIOACTIVE SEED LUMPECTOMY X2 AND LEFT SENTINEL LYMPH NODE BIOPSY AND MAPPING;  Surgeon: Stark Klein, MD;  Location: [facility];  Service: General;  Laterality: Left;   BREAST SURGERY Left    cyst and biopsy   PORT-A-CATH REMOVAL Left 01/06/2019   Procedure: REMOVAL PORT-A-CATH;  Surgeon: Stark Klein, MD;  Location: [facility];  Service: General;  Laterality: Left;   PORTACATH PLACEMENT Left 03/10/2018   Procedure: INSERTION PORT-A-CATH;  Surgeon: Stark Klein, MD;  Location: [place];  Service: General;  Laterality: Left;   RADIOACTIVE SEED GUIDED EXCISIONAL BREAST BIOPSY Right 09/01/2018   Procedure: RADIOACTIVE SEED GUIDED EXCISIONAL RIGHT BREAST BIOPSY;  Surgeon: Stark Klein, MD;  Location: [place] [facility];  Service: General;  Laterality: Right;    FAMILY HISTORY: Family History  Problem Relation Age of Onset   Hypertension Mother    Heart disease Mother    Dementia Mother    Healthy Father    Colon cancer Maternal Grandmother    Cancer Paternal Grandmother        unk type   Lung cancer Cousin        83s   Breast cancer Cousin        60s   Breast cancer Cousin        31s   Breast cancer Cousin    Cancer Paternal Aunt        unk type d. 64, possibly pancreatic   Cancer Paternal Aunt        unk type d. 29s   Cancer Paternal Uncle        unk type    Cancer Paternal Uncle        unk type d. 46s   Cancer Maternal Aunt        unk type, d. 21s   Throat cancer Maternal Uncle        d. 83s   Ovarian cancer Neg Hx   Patient father is alive at 70 years old. Patient mother died from heart disease and dementia at age 49.  The patient denies a family hx of ovarian cancer. She has 3 siblings, 1 brother and 2 sisters. She has a maternal cousin diagnosed with lung cancer in her 45s. She has 3  paternal cousins with breast cancer, one was diagnosed in her 26s and has passed away.   GYNECOLOGIC HISTORY:  No LMP recorded. Patient is postmenopausal. Menarche: 70 years old Age at first live birth: n/a GX P 0 LMP 2010 Contraceptive n/a HRT no  Hysterectomy? no BSo? no   SOCIAL HISTORY:  She is single and works as a Scientist, water quality at [employer]). She lives alone, with no pets.    ADVANCED DIRECTIVES: Not in place.  At the 03/03/2018 visit she was given the appropriate documents to complete and notarize at her discretion.  She is planning to name her niece, [name], as her HCPOA.   HEALTH MAINTENANCE: Social History   Tobacco Use   Smoking status: Never   Smokeless tobacco: Never  Substance  Use Topics   Alcohol use: Yes    Comment: seldom   Drug use: No     Colonoscopy: 2014? [facility]  PAP: 02/02/2018  Bone density: 03/15/2016, T-score 0.4, Dr. Alden Hipp   No Known Allergies  Current Outpatient Medications  Medication Sig Dispense Refill   cholecalciferol (VITAMIN D3) 25 MCG (1000 UNIT) tablet Take 1 tablet (1,000 Units total) by mouth daily. 90 tablet 4   amLODipine (NORVASC) 10 MG tablet Take 10 mg by mouth every morning.      anastrozole (ARIMIDEX) 1 MG tablet Take 1 tablet (1 mg total) by mouth daily. 90 tablet 4   aspirin 81 MG chewable tablet Chew 81 mg by mouth daily.     fluticasone (FLONASE) 50 MCG/ACT nasal spray Place 1-2 sprays into both nostrils daily. 16 g 0   losartan (COZAAR) 100 MG tablet Take 100 mg by mouth every morning.      meclizine (ANTIVERT) 12.5 MG tablet Take 1-2 tablets (12.5-25 mg total) by mouth 3 (three) times daily as needed for dizziness. 30 tablet 0   pravastatin (PRAVACHOL) 20 MG tablet Take 20 mg by mouth every morning.      No current facility-administered medications for this visit.    OBJECTIVE: African-American woman in no acute distress  Vitals:   09/26/20 1607  BP: 130/65   Pulse: 90  Resp: 18  Temp: (!) 97.4 F (36.3 C)  SpO2: 98%   Wt Readings from Last 3 Encounters:  09/26/20 185 lb 6.4 oz (84.1 kg)  03/19/20 179 lb 1.6 oz (81.2 kg)  09/20/19 181 lb 4.8 oz (82.2 kg)   Body mass index is 33.91 kg/m.    Sclerae unicteric, EOMs intact Wearing a mask No cervical or supraclavicular adenopathy Lungs no rales or rhonchi Heart regular rate and rhythm Abd soft, nontender, positive bowel sounds MSK no focal spinal tenderness, no upper extremity lymphedema Neuro: nonfocal, well oriented, appropriate affect Breasts: The right breast is unremarkable.  The left breast has undergone lumpectomy and radiation.  There is no evidence of disease recurrence.  Both axillae are benign.   LAB RESULTS:  CMP     Component Value Date/Time   NA 143 09/26/2020 1552   NA 141 07/20/2020 1352   K 3.3 (L) 09/26/2020 1552   CL 109 09/26/2020 1552   CO2 23 09/26/2020 1552   GLUCOSE 172 (H) 09/26/2020 1552   BUN 15 09/26/2020 1552   BUN 12 07/20/2020 1352   CREATININE 0.82 09/26/2020 1552   CREATININE 0.77 12/21/2019 1358   CALCIUM 10.4 (H) 09/26/2020 1552   PROT 7.6 09/26/2020 1552   ALBUMIN 4.0 09/26/2020 1552   AST 16 09/26/2020 1552   AST 17 12/21/2019 1358   ALT 22 09/26/2020 1552   ALT 25 12/21/2019 1358   ALKPHOS 111 09/26/2020 1552   BILITOT 0.3 09/26/2020 1552   BILITOT 0.3 12/21/2019 1358   GFRNONAA >60 09/26/2020 1552   GFRNONAA >60 12/21/2019 1358   GFRAA >60 09/20/2019 1341    No results found for: TOTALPROTELP, ALBUMINELP, A1GS, A2GS, BETS, BETA2SER, GAMS, MSPIKE, SPEI  No results found for: KPAFRELGTCHN, LAMBDASER, KAPLAMBRATIO  Lab Results  Component Value Date   WBC 10.0 09/26/2020   NEUTROABS 6.3 09/26/2020   HGB 10.8 (L) 09/26/2020   HCT 34.8 (L) 09/26/2020   MCV 89.5 09/26/2020   PLT 192 09/26/2020   No results found for: LABCA2  No components found for: ZOXWRU045  No results for input(s): INR in the last 168  hours.  No  results found for: LABCA2  No results found for: MKL491  No results found for: PHX505  No results found for: WPV948  No results found for: CA2729  No components found for: HGQUANT  No results found for: CEA1 / No results found for: CEA1   No results found for: AFPTUMOR  No results found for: [facility]  Appointment on 09/26/2020  Component Date Value Ref Range Status   Sodium 09/26/2020 143  135 - 145 mmol/L Final   Potassium 09/26/2020 3.3 (A) 3.5 - 5.1 mmol/L Final   Chloride 09/26/2020 109  98 - 111 mmol/L Final   CO2 09/26/2020 23  22 - 32 mmol/L Final   Glucose, Bld 09/26/2020 172 (A) 70 - 99 mg/dL Final   Glucose reference range applies only to samples taken after fasting for at least 8 hours.   BUN 09/26/2020 15  8 - 23 mg/dL Final   Creatinine, Ser 09/26/2020 0.82  0.44 - 1.00 mg/dL Final   Calcium 09/26/2020 10.4 (A) 8.9 - 10.3 mg/dL Final   Total Protein 09/26/2020 7.6  6.5 - 8.1 g/dL Final   Albumin 09/26/2020 4.0  3.5 - 5.0 g/dL Final   AST 09/26/2020 16  15 - 41 U/L Final   ALT 09/26/2020 22  0 - 44 U/L Final   Alkaline Phosphatase 09/26/2020 111  38 - 126 U/L Final   Total Bilirubin 09/26/2020 0.3  0.3 - 1.2 mg/dL Final   GFR, Estimated 09/26/2020 >60  >60 mL/min Final   Comment: (NOTE) Calculated using the CKD-EPI Creatinine Equation (2021)    Anion gap 09/26/2020 11  5 - 15 Final   Performed at [facility], [place] [address], [place], [place] [address]   WBC 09/26/2020 10.0  4.0 - 10.5 K/uL Final   RBC 09/26/2020 3.89  3.87 - 5.11 MIL/uL Final   Hemoglobin 09/26/2020 10.8 (A) 12.0 - 15.0 g/dL Final   HCT 09/26/2020 34.8 (A) 36.0 - 46.0 % Final   MCV 09/26/2020 89.5  80.0 - 100.0 fL Final   MCH 09/26/2020 27.8  26.0 - 34.0 pg Final   MCHC 09/26/2020 31.0  30.0 - 36.0 g/dL Final   RDW 09/26/2020 13.8  11.5 - 15.5 % Final   Platelets 09/26/2020 192  150 - 400 K/uL Final   nRBC 09/26/2020 0.0  0.0 - 0.2 % Final   Neutrophils  Relative % 09/26/2020 63  % Final   Neutro Abs 09/26/2020 6.3  1.7 - 7.7 K/uL Final   Lymphocytes Relative 09/26/2020 27  % Final   Lymphs Abs 09/26/2020 2.7  0.7 - 4.0 K/uL Final   Monocytes Relative 09/26/2020 6  % Final   Monocytes Absolute 09/26/2020 0.6  0.1 - 1.0 K/uL Final   Eosinophils Relative 09/26/2020 2  % Final   Eosinophils Absolute 09/26/2020 0.2  0.0 - 0.5 K/uL Final   Basophils Relative 09/26/2020 1  % Final   Basophils Absolute 09/26/2020 0.1  0.0 - 0.1 K/uL Final   Immature Granulocytes 09/26/2020 1  % Final   Abs Immature Granulocytes 09/26/2020 0.05  0.00 - 0.07 K/uL Final   Performed at [facility], [place] [address], [place], [place] [address]    No results found for: HGBA, HGBA2QUANT, HGBFQUANT, HGBSQUAN (Hemoglobinopathy evaluation)   No results found for: LDH  No results found for: IRON, TIBC, IRONPCTSAT (Iron and TIBC)  No results found for: FERRITIN  Urinalysis    Component Value Date/Time  COLORURINE YELLOW 04/12/2018 1504   APPEARANCEUR CLEAR 04/12/2018 1504   LABSPEC 1.020 04/12/2018 1504   PHURINE 5.0 04/12/2018 1504   GLUCOSEU NEGATIVE 04/12/2018 1504   HGBUR NEGATIVE 04/12/2018 1504   BILIRUBINUR NEGATIVE 04/12/2018 1504   KETONESUR NEGATIVE 04/12/2018 1504   PROTEINUR NEGATIVE 04/12/2018 1504   UROBILINOGEN 0.2 07/07/2012 1159   NITRITE NEGATIVE 04/12/2018 1504   LEUKOCYTESUR TRACE (A) 04/12/2018 1504    STUDIES: No results found.   ELIGIBLE FOR AVAILABLE RESEARCH PROTOCOL: Upbeat   ASSESSMENT: 70 y.o. [place] woman status post left breast biopsy x2 on 02/19/2018, showing  (a) in the upper outer quadrant, a clinical T1c N0, stage IA invasive carcinoma, likely lobular, grade 2, estrogen and progesterone receptor positive, HER-2 not amplified, with an MIB-1 of 5%  (b) in the lower outer quadrant a clinical T1c N0, stage IA-B invasive ductal carcinoma, grade 2, estrogen receptor only moderately positive at  10%, progesterone receptor negative, with an MIB-1 of 40%, and HER-2 not amplified  (1) neoadjuvant chemotherapy will consist of doxorubicin and cyclophosphamide in dose dense fashion x4 starting 03/16/2018, 08/14/2018 04/27/2018, followed by paclitaxel and carboplatin weekly x12 starting 05/11/2018, completed 07/27/2018  (a) echo on 03/11/2018 shows well preserved EF of 60-65%  (2) status post left lumpectomy and sentinel lymph node sampling 09/01/2018 showing  (a) invasive ductal carcinoma, grade 2, ypT1b ypN0, triple negative  (b) invasive lobular carcinoma, grade 2, ypT1c ypN0, estrogen and progesterone receptor positive, HER-2 not amplified  (3) adjuvant radiation 10/04/2018 through 11/02/2018 Site Technique Total Dose Dose per Fx Completed Fx Beam Energies  Breast: Breast_Lt 3D 40.05/40.05 2.67 15/15 6X, 10X  Breast: Breast_Lt_Bst 3D 12/12 2 6/6 6X, 10X    (4) anastrozole (for upper outer quadrant tumor) started 12/06/2018  (a) bone density 03/25/2016 at the breast center showed a T score of 0.4 (normal).  (b) bone density 06/2019 was normal  (5) genetics testing on 03/24/2018 showed no pathogenic mutations.  Genes tested include:  APC, ATM, AXIN2, BARD1, BMPR1A, BRCA1, BRCA2, BRIP1, CDH1, CDKN2A (p14ARF), CDKN2A (p16INK4a), CKD4, CHEK2, CTNNA1, DICER1, EPCAM (Deletion/duplication testing only), GREM1 (promoter region deletion/duplication testing only), KIT, MEN1, MLH1, MSH2, MSH3, MSH6, MUTYH, NBN, NF1, NHTL1, PALB2, PDGFRA, PMS2, POLD1, POLE, PTEN, RAD50, RAD51C, RAD51D, SDHB, SDHC, SDHD, SMAD4, SMARCA4. STK11, TP53, TSC1, TSC2, and VHL.  The following genes were evaluated for sequence changes only: SDHA and HOXB13 c.251G>A variant only.   PLAN: Meeyah is now 2 years out from definitive surgery for her breast cancer with no evidence of disease recurrence.  This is favorable.  She is tolerating anastrozole well and the plan will be to continue that a total of 5 years.  She will be due for  DEXA scan May 2023.  Accordingly she will be set up for mammography at the same time and we will see her in June to discuss results.  I also suggested she start vitamin D supplementation I went ahead and wrote her the prescription for it.  Given her frequent episodes of vertigo I am referring her to vestibular rehab to see if they can teach her how to do the Epley's maneuver or otherwise how to manage it to make it less frequent  She knows to call for any other issue that may develop before next visit  Total encounter time 20 minutes.Sarajane Jews C. Dmonte Maher, MD 09/26/20 4:34 PM Medical Oncology and Hematology [facility] [place], [place] [address] Tel. [phone]  Fax. [phone]   I, Wilburn Mylar, am acting as scribe for Dr. Virgie Dad. Nil Bolser.  I, Lurline Del MD, have reviewed the above documentation for accuracy and completeness, and I agree with the above.   *Total Encounter Time as defined by the Centers for Medicare and Medicaid Services includes, in addition to the face-to-face time of a patient visit (documented in the note above) non-face-to-face time: obtaining and reviewing outside history, ordering and reviewing medications, tests or procedures, care coordination (communications with other health care professionals or caregivers) and documentation in the medical record.

## 2020-09-27 ENCOUNTER — Telehealth: Payer: Self-pay | Admitting: Oncology

## 2020-09-27 NOTE — Telephone Encounter (Signed)
Scheduled appointment per 08/03 los. Left message.

## 2020-10-04 ENCOUNTER — Ambulatory Visit: Payer: Medicare Other

## 2020-10-05 ENCOUNTER — Encounter: Payer: Medicare Other | Admitting: Physical Therapy

## 2020-10-09 ENCOUNTER — Ambulatory Visit: Payer: Medicare Other | Attending: Physical Therapy

## 2020-10-10 DIAGNOSIS — D649 Anemia, unspecified: Secondary | ICD-10-CM | POA: Diagnosis not present

## 2020-10-10 DIAGNOSIS — D690 Allergic purpura: Secondary | ICD-10-CM | POA: Diagnosis not present

## 2020-10-10 DIAGNOSIS — N181 Chronic kidney disease, stage 1: Secondary | ICD-10-CM | POA: Diagnosis not present

## 2020-10-10 DIAGNOSIS — Z87442 Personal history of urinary calculi: Secondary | ICD-10-CM | POA: Diagnosis not present

## 2020-10-10 DIAGNOSIS — E7800 Pure hypercholesterolemia, unspecified: Secondary | ICD-10-CM | POA: Diagnosis not present

## 2020-10-10 DIAGNOSIS — I129 Hypertensive chronic kidney disease with stage 1 through stage 4 chronic kidney disease, or unspecified chronic kidney disease: Secondary | ICD-10-CM | POA: Diagnosis not present

## 2020-10-30 DIAGNOSIS — Z1389 Encounter for screening for other disorder: Secondary | ICD-10-CM | POA: Diagnosis not present

## 2020-10-30 DIAGNOSIS — E7800 Pure hypercholesterolemia, unspecified: Secondary | ICD-10-CM | POA: Diagnosis not present

## 2020-10-30 DIAGNOSIS — Z23 Encounter for immunization: Secondary | ICD-10-CM | POA: Diagnosis not present

## 2020-10-30 DIAGNOSIS — R7303 Prediabetes: Secondary | ICD-10-CM | POA: Diagnosis not present

## 2020-10-30 DIAGNOSIS — Z0000 Encounter for general adult medical examination without abnormal findings: Secondary | ICD-10-CM | POA: Diagnosis not present

## 2020-10-30 DIAGNOSIS — C50912 Malignant neoplasm of unspecified site of left female breast: Secondary | ICD-10-CM | POA: Diagnosis not present

## 2020-10-30 DIAGNOSIS — I10 Essential (primary) hypertension: Secondary | ICD-10-CM | POA: Diagnosis not present

## 2020-12-06 DIAGNOSIS — N390 Urinary tract infection, site not specified: Secondary | ICD-10-CM | POA: Diagnosis not present

## 2020-12-06 DIAGNOSIS — N181 Chronic kidney disease, stage 1: Secondary | ICD-10-CM | POA: Diagnosis not present

## 2021-01-03 DIAGNOSIS — R051 Acute cough: Secondary | ICD-10-CM | POA: Diagnosis not present

## 2021-01-03 DIAGNOSIS — Z03818 Encounter for observation for suspected exposure to other biological agents ruled out: Secondary | ICD-10-CM | POA: Diagnosis not present

## 2021-01-03 DIAGNOSIS — J069 Acute upper respiratory infection, unspecified: Secondary | ICD-10-CM | POA: Diagnosis not present

## 2021-01-03 DIAGNOSIS — J029 Acute pharyngitis, unspecified: Secondary | ICD-10-CM | POA: Diagnosis not present

## 2021-01-24 DIAGNOSIS — R0981 Nasal congestion: Secondary | ICD-10-CM | POA: Diagnosis not present

## 2021-01-24 DIAGNOSIS — J209 Acute bronchitis, unspecified: Secondary | ICD-10-CM | POA: Diagnosis not present

## 2021-01-24 DIAGNOSIS — R051 Acute cough: Secondary | ICD-10-CM | POA: Diagnosis not present

## 2021-03-19 ENCOUNTER — Other Ambulatory Visit: Payer: Self-pay | Admitting: Hematology and Oncology

## 2021-03-19 ENCOUNTER — Other Ambulatory Visit: Payer: Self-pay | Admitting: Oncology

## 2021-03-19 DIAGNOSIS — Z171 Estrogen receptor negative status [ER-]: Secondary | ICD-10-CM

## 2021-03-19 DIAGNOSIS — Z170 Estrogen receptor positive status [ER+]: Secondary | ICD-10-CM

## 2021-03-28 ENCOUNTER — Other Ambulatory Visit: Payer: Self-pay | Admitting: Adult Health

## 2021-04-29 DIAGNOSIS — E7800 Pure hypercholesterolemia, unspecified: Secondary | ICD-10-CM | POA: Diagnosis not present

## 2021-04-29 DIAGNOSIS — R7303 Prediabetes: Secondary | ICD-10-CM | POA: Diagnosis not present

## 2021-04-29 DIAGNOSIS — I10 Essential (primary) hypertension: Secondary | ICD-10-CM | POA: Diagnosis not present

## 2021-04-29 DIAGNOSIS — C50912 Malignant neoplasm of unspecified site of left female breast: Secondary | ICD-10-CM | POA: Diagnosis not present

## 2021-05-02 ENCOUNTER — Ambulatory Visit
Admission: RE | Admit: 2021-05-02 | Discharge: 2021-05-02 | Disposition: A | Discharge status: Home / Self Care | Payer: Medicare Other | Source: Ambulatory Visit | Attending: Hematology and Oncology | Admitting: Hematology and Oncology

## 2021-05-02 ENCOUNTER — Other Ambulatory Visit: Payer: Self-pay

## 2021-05-02 DIAGNOSIS — Z171 Estrogen receptor negative status [ER-]: Secondary | ICD-10-CM

## 2021-05-02 DIAGNOSIS — C50412 Malignant neoplasm of upper-outer quadrant of left female breast: Secondary | ICD-10-CM

## 2021-05-02 DIAGNOSIS — R922 Inconclusive mammogram: Secondary | ICD-10-CM | POA: Diagnosis not present

## 2021-06-10 DIAGNOSIS — H524 Presbyopia: Secondary | ICD-10-CM | POA: Diagnosis not present

## 2021-06-10 DIAGNOSIS — Z961 Presence of intraocular lens: Secondary | ICD-10-CM | POA: Diagnosis not present

## 2021-06-17 DIAGNOSIS — N181 Chronic kidney disease, stage 1: Secondary | ICD-10-CM | POA: Diagnosis not present

## 2021-07-04 ENCOUNTER — Telehealth: Payer: Self-pay | Admitting: Hematology and Oncology

## 2021-07-04 NOTE — Telephone Encounter (Signed)
Rescheduled appointment per provider PAL. Left message. 

## 2021-07-20 DIAGNOSIS — U071 COVID-19: Secondary | ICD-10-CM | POA: Diagnosis not present

## 2021-07-20 DIAGNOSIS — J208 Acute bronchitis due to other specified organisms: Secondary | ICD-10-CM | POA: Diagnosis not present

## 2021-07-20 DIAGNOSIS — R051 Acute cough: Secondary | ICD-10-CM | POA: Diagnosis not present

## 2021-07-20 DIAGNOSIS — Z20822 Contact with and (suspected) exposure to covid-19: Secondary | ICD-10-CM | POA: Diagnosis not present

## 2021-07-30 ENCOUNTER — Ambulatory Visit: Payer: Medicare Other | Admitting: Hematology and Oncology

## 2021-07-30 ENCOUNTER — Other Ambulatory Visit: Payer: Medicare Other

## 2021-08-06 NOTE — Progress Notes (Incomplete)
Patient Care Team: Emily Arabian, MD as PCP - General (Family Medicine) Magrinat, Virgie Dad, MD (Inactive) as Consulting Physician (Oncology) Stark Klein, MD as Consulting Physician (General Surgery) Gery Pray, MD as Consulting Physician (Radiation Oncology) Arta Silence, MD as Consulting Physician (Gastroenterology)  DIAGNOSIS: No diagnosis found.  SUMMARY OF ONCOLOGIC HISTORY: Oncology History  Malignant neoplasm of lower-outer quadrant of left breast of female, estrogen receptor negative ([place])  02/26/2018 Initial Diagnosis   Malignant neoplasm of lower-outer quadrant of left breast of female, estrogen receptor negative ([place])   03/17/2018 - 07/27/2018 Chemotherapy   DOXOrubicin (ADRIAMYCIN) chemo injection 112 mg, 60 mg/m2 = 112 mg, Intravenous,  Once, 4 of 4 cycles. Administration: 112 mg (03/17/2018), 112 mg (03/30/2018), 112 mg (04/13/2018), 112 mg (04/27/2018)  palonosetron (ALOXI) injection 0.25 mg, 0.25 mg, Intravenous,  Once, 8 of 8 cycles. Administration: 0.25 mg (03/17/2018), 0.25 mg (05/11/2018), 0.25 mg (03/30/2018), 0.25 mg (04/13/2018), 0.25 mg (04/27/2018), 0.25 mg (06/01/2018), 0.25 mg (05/18/2018), 0.25 mg (05/25/2018), 0.25 mg (06/08/2018), 0.25 mg (06/15/2018), 0.25 mg (06/22/2018), 0.25 mg (06/29/2018), 0.25 mg (07/06/2018), 0.25 mg (07/13/2018), 0.25 mg (07/20/2018), 0.25 mg (07/27/2018)  pegfilgrastim-cbqv (UDENYCA) injection 6 mg, 6 mg, Subcutaneous, Once, 4 of 4 cycles Administration: 6 mg (03/19/2018), 6 mg (04/01/2018), 6 mg (04/15/2018), 6 mg (04/29/2018)  CARBOplatin (PARAPLATIN) 190 mg in sodium chloride 0.9 % 250 mL chemo infusion, 190 mg (100 % of original dose 187.2 mg), Intravenous,  Once, 4 of 4 cycles. Dose modification:   (original dose 187.2 mg, Cycle 5),   (original dose 187.2 mg, Cycle 5). Administration: 190 mg (05/11/2018), 190 mg (05/18/2018), 190 mg (05/25/2018), 190 mg (06/01/2018), 190 mg (06/08/2018), 190 mg (06/15/2018), 190 mg (06/22/2018), 190 mg (06/29/2018), 190 mg  (07/06/2018), 190 mg (07/13/2018), 190 mg (07/20/2018)  cyclophosphamide (CYTOXAN) 1,120 mg in sodium chloride 0.9 % 250 mL chemo infusion, 600 mg/m2 = 1,120 mg, Intravenous,  Once, 4 of 4 cycles Administration: 1,120 mg (03/17/2018), 1,120 mg (03/30/2018), 1,120 mg (04/13/2018), 1,120 mg (04/27/2018)  gemcitabine (GEMZAR) 1,482 mg in sodium chloride 0.9 % 250 mL chemo infusion, 800 mg/m2 = 1,482 mg (100 % of original dose 800 mg/m2), Intravenous,  Once, 1 of 1 cycle Dose modification: 800 mg/m2 (original dose 800 mg/m2, Cycle 8, Reason: Provider Judgment) Administration: 1,482 mg (07/27/2018)  PACLitaxel (TAXOL) 150 mg in sodium chloride 0.9 % 250 mL chemo infusion (</= 50m/m2), 80 mg/m2 = 150 mg, Intravenous,  Once, 4 of 4 cycles Administration: 150 mg (05/11/2018), 150 mg (05/18/2018), 150 mg (05/25/2018), 150 mg (06/01/2018), 150 mg (06/08/2018), 150 mg (06/15/2018), 150 mg (06/22/2018), 150 mg (06/29/2018), 150 mg (07/06/2018), 150 mg (07/13/2018), 150 mg (07/20/2018)  fosaprepitant (EMEND) 150 mg   dexamethasone (DECADRON) 12 mg in sodium chloride 0.9 % 145 mL IVPB, , Intravenous,  Once, 4 of 4 cycles Administration:  (03/17/2018),  (03/30/2018),  (04/13/2018),  (04/27/2018)    Malignant neoplasm of upper-outer quadrant of left breast in female, estrogen receptor positive ([place]  02/26/2018 Initial Diagnosis   Malignant neoplasm of upper-outer quadrant of left breast in female, estrogen receptor positive ([place] [employer]   03/17/2018 - 07/27/2018 Chemotherapy   DOXOrubicin (ADRIAMYCIN) chemo injection 112 mg, 60 mg/m2 = 112 mg, Intravenous,  Once, 4 of 4 cycles. Administration: 112 mg (03/17/2018), 112 mg (03/30/2018), 112 mg (04/13/2018), 112 mg (04/27/2018)  palonosetron (ALOXI) injection 0.25 mg, 0.25 mg, Intravenous,  Once, 8 of 8 cycles. Administration: 0.25 mg (03/17/2018), 0.25 mg (05/11/2018), 0.25 mg (03/30/2018), 0.25 mg (04/13/2018), 0.25 mg (04/27/2018), 0.25  mg (06/01/2018), 0.25 mg (05/18/2018), 0.25 mg (05/25/2018), 0.25 mg  (06/08/2018), 0.25 mg (06/15/2018), 0.25 mg (06/22/2018), 0.25 mg (06/29/2018), 0.25 mg (07/06/2018), 0.25 mg (07/13/2018), 0.25 mg (07/20/2018), 0.25 mg (07/27/2018)  pegfilgrastim-cbqv (UDENYCA) injection 6 mg, 6 mg, Subcutaneous, Once, 4 of 4 cycles Administration: 6 mg (03/19/2018), 6 mg (04/01/2018), 6 mg (04/15/2018), 6 mg (04/29/2018)  CARBOplatin (PARAPLATIN) 190 mg in sodium chloride 0.9 % 250 mL chemo infusion, 190 mg (100 % of original dose 187.2 mg), Intravenous,  Once, 4 of 4 cycles. Dose modification:   (original dose 187.2 mg, Cycle 5),   (original dose 187.2 mg, Cycle 5). Administration: 190 mg (05/11/2018), 190 mg (05/18/2018), 190 mg (05/25/2018), 190 mg (06/01/2018), 190 mg (06/08/2018), 190 mg (06/15/2018), 190 mg (06/22/2018), 190 mg (06/29/2018), 190 mg (07/06/2018), 190 mg (07/13/2018), 190 mg (07/20/2018)  cyclophosphamide (CYTOXAN) 1,120 mg in sodium chloride 0.9 % 250 mL chemo infusion, 600 mg/m2 = 1,120 mg, Intravenous,  Once, 4 of 4 cycles Administration: 1,120 mg (03/17/2018), 1,120 mg (03/30/2018), 1,120 mg (04/13/2018), 1,120 mg (04/27/2018)  gemcitabine (GEMZAR) 1,482 mg in sodium chloride 0.9 % 250 mL chemo infusion, 800 mg/m2 = 1,482 mg (100 % of original dose 800 mg/m2), Intravenous,  Once, 1 of 1 cycle Dose modification: 800 mg/m2 (original dose 800 mg/m2, Cycle 8, Reason: Provider Judgment) Administration: 1,482 mg (07/27/2018)  PACLitaxel (TAXOL) 150 mg in sodium chloride 0.9 % 250 mL chemo infusion (</= 81m/m2), 80 mg/m2 = 150 mg, Intravenous,  Once, 4 of 4 cycles Administration: 150 mg (05/11/2018), 150 mg (05/18/2018), 150 mg (05/25/2018), 150 mg (06/01/2018), 150 mg (06/08/2018), 150 mg (06/15/2018), 150 mg (06/22/2018), 150 mg (06/29/2018), 150 mg (07/06/2018), 150 mg (07/13/2018), 150 mg (07/20/2018)  fosaprepitant (EMEND) 150 mg   dexamethasone (DECADRON) 12 mg in sodium chloride 0.9 % 145 mL IVPB, , Intravenous,  Once, 4 of 4 cycles Administration:  (03/17/2018),  (03/30/2018),  (04/13/2018),   (04/27/2018)    03/24/2018 Genetic Testing   Genetic testing showed no pathogenic mutations.  Genes tested include:  APC, ATM, AXIN2, BARD1, BMPR1A, BRCA1, BRCA2, BRIP1, CDH1, CDKN2A (p14ARF), CDKN2A (p16INK4a), CKD4, CHEK2, CTNNA1, DICER1, EPCAM (Deletion/duplication testing only), GREM1 (promoter region deletion/duplication testing only), KIT, MEN1, MLH1, MSH2, MSH3, MSH6, MUTYH, NBN, NF1, NHTL1, PALB2, PDGFRA, PMS2, POLD1, POLE, PTEN, RAD50, RAD51C, RAD51D, SDHB, SDHC, SDHD, SMAD4, SMARCA4. STK11, TP53, TSC1, TSC2, and VHL.  The following genes were evaluated for sequence changes only: SDHA and HOXB13 c.251G>A variant only.   09/01/2018 Surgery   Right breast lumpectomy w/seed (Dorris Fetch ([phone] revealed no malignancy.  Left breast lumpectomy @ 2 o'clock revealed IDC, grade 2, spanning 0.9 cm and focal high grade DCIS. Negative margins. Triple negative.  Left breast lumpectomy @ 3:30 revealed invasive lobular carcinoma, grade 2, spanning 1.4 cm, <0.1 cm of the anterior margin multifocally. ER positive, PR positive, HER-2 negative.  0/2 left axillary lymph nodes were positive for carcinoma.   10/04/2018 - 11/02/2018 Radiation Therapy   The patient initially received a dose of 40.05 Gy in 15 fractions to the breast using whole-breast tangent fields. This was delivered using a 3-D conformal technique. The pt received a boost delivering an additional 12 Gy in 2 fractions using a electron boost with 148m electrons. The total dose was 52.05 Gy.   11/2018 - 11/2023 Anti-estrogen oral therapy   Anastrozole     CHIEF COMPLIANT: Follow-up to discuss scans   INTERVAL HISTORY: Emily Chambers a [age].o with the above mention synchronous breast cancers, one  estrogen receptor positive, one estrogen receptor functionally negative. She presents to the clinic today for a follow-up to discuss scans.     ALLERGIES:  has No Known Allergies.  MEDICATIONS:  Current Outpatient Medications  Medication Sig  Dispense Refill   amLODipine (NORVASC) 10 MG tablet Take 10 mg by mouth every morning.      anastrozole (ARIMIDEX) 1 MG tablet Take 1 tablet (1 mg total) by mouth daily. 90 tablet 4   aspirin 81 MG chewable tablet Chew 81 mg by mouth daily.     cholecalciferol (VITAMIN D3) 25 MCG (1000 UNIT) tablet Take 1 tablet (1,000 Units total) by mouth daily. 90 tablet 4   fluticasone (FLONASE) 50 MCG/ACT nasal spray Place 1-2 sprays into both nostrils daily. 16 g 0   losartan (COZAAR) 100 MG tablet Take 100 mg by mouth every morning.      meclizine (ANTIVERT) 12.5 MG tablet Take 1-2 tablets (12.5-25 mg total) by mouth 3 (three) times daily as needed for dizziness. 30 tablet 0   pravastatin (PRAVACHOL) 20 MG tablet Take 20 mg by mouth every morning.      No current facility-administered medications for this visit.    PHYSICAL EXAMINATION: ECOG PERFORMANCE STATUS: {CHL ONC ECOG [id number]}  There were no vitals filed for this visit. There were no vitals filed for this visit.  BREAST:*** No palpable masses or nodules in either right or left breasts. No palpable axillary supraclavicular or infraclavicular adenopathy no breast tenderness or nipple discharge. (exam performed in the presence of a chaperone)  LABORATORY DATA:  I have reviewed the data as listed    Latest Ref Rng & Units 09/26/2020    3:52 PM 07/20/2020    1:52 PM 12/21/2019    1:58 PM  CMP  Glucose 70 - 99 mg/dL 172  100  97   BUN 8 - 23 mg/dL 15  12  15    Creatinine 0.44 - 1.00 mg/dL 0.82  0.63  0.77   Sodium 135 - 145 mmol/L 143  141  141   Potassium 3.5 - 5.1 mmol/L 3.3  4.0  3.5   Chloride 98 - 111 mmol/L 109  102  107   CO2 22 - 32 mmol/L 23  23  27    Calcium 8.9 - 10.3 mg/dL 10.4  10.8  10.0   Total Protein 6.5 - 8.1 g/dL 7.6   7.2   Total Bilirubin 0.3 - 1.2 mg/dL 0.3   0.3   Alkaline Phos 38 - 126 U/L 111   108   AST 15 - 41 U/L 16   17   ALT 0 - 44 U/L 22   25     Lab Results  Component Value Date   WBC 10.0  09/26/2020   HGB 10.8 (L) 09/26/2020   HCT 34.8 (L) 09/26/2020   MCV 89.5 09/26/2020   PLT 192 09/26/2020   NEUTROABS 6.3 09/26/2020    ASSESSMENT & PLAN:  No problem-specific Assessment & Plan notes found for this encounter.    No orders of the defined types were placed in this encounter.  The patient has a good understanding of the overall plan. she agrees with it. she will call with any problems that may develop before the next visit here. Total time spent: 30 mins including face to face time and time spent for planning, charting and co-ordination of care   Suzzette Righter, [place] 08/06/21    I Gardiner Coins am scribing for Dr. Lindi Adie  ***

## 2021-08-19 ENCOUNTER — Other Ambulatory Visit: Payer: Self-pay | Admitting: *Deleted

## 2021-08-19 DIAGNOSIS — Z171 Estrogen receptor negative status [ER-]: Secondary | ICD-10-CM

## 2021-08-19 DIAGNOSIS — C50412 Malignant neoplasm of upper-outer quadrant of left female breast: Secondary | ICD-10-CM

## 2021-08-20 ENCOUNTER — Inpatient Hospital Stay: Payer: Medicare Other | Attending: Hematology and Oncology

## 2021-08-20 ENCOUNTER — Other Ambulatory Visit: Payer: Self-pay

## 2021-08-20 ENCOUNTER — Inpatient Hospital Stay: Payer: Medicare Other | Admitting: Hematology and Oncology

## 2021-08-20 DIAGNOSIS — C50512 Malignant neoplasm of lower-outer quadrant of left female breast: Secondary | ICD-10-CM

## 2021-08-20 DIAGNOSIS — Z923 Personal history of irradiation: Secondary | ICD-10-CM | POA: Insufficient documentation

## 2021-08-20 DIAGNOSIS — Z853 Personal history of malignant neoplasm of breast: Secondary | ICD-10-CM | POA: Diagnosis not present

## 2021-08-20 DIAGNOSIS — Z171 Estrogen receptor negative status [ER-]: Secondary | ICD-10-CM | POA: Diagnosis not present

## 2021-08-20 DIAGNOSIS — Z79811 Long term (current) use of aromatase inhibitors: Secondary | ICD-10-CM | POA: Diagnosis not present

## 2021-08-20 DIAGNOSIS — Z170 Estrogen receptor positive status [ER+]: Secondary | ICD-10-CM

## 2021-08-20 DIAGNOSIS — C50412 Malignant neoplasm of upper-outer quadrant of left female breast: Secondary | ICD-10-CM | POA: Insufficient documentation

## 2021-08-20 LAB — CMP (CANCER CENTER ONLY)
ALT: 27 U/L (ref 0–44)
AST: 19 U/L (ref 15–41)
Albumin: 4.2 g/dL (ref 3.5–5.0)
Alkaline Phosphatase: 93 U/L (ref 38–126)
Anion gap: 7 (ref 5–15)
BUN: 17 mg/dL (ref 8–23)
CO2: 26 mmol/L (ref 22–32)
Calcium: 10.2 mg/dL (ref 8.9–10.3)
Chloride: 108 mmol/L (ref 98–111)
Creatinine: 0.88 mg/dL (ref 0.44–1.00)
GFR, Estimated: >60 mL/min (ref >60)
Glucose, Bld: 83 mg/dL (ref 70–99)
Potassium: 3.9 mmol/L (ref 3.5–5.1)
Sodium: 141 mmol/L (ref 135–145)
Total Bilirubin: 0.3 mg/dL (ref 0.3–1.2)
Total Protein: 7.3 g/dL (ref 6.5–8.1)

## 2021-08-20 LAB — CBC WITH DIFFERENTIAL (CANCER CENTER ONLY)
Abs Immature Granulocytes: 0.04 10*3/uL (ref 0.00–0.07)
Basophils Absolute: 0.1 10*3/uL (ref 0.0–0.1)
Basophils Relative: 1 %
Eosinophils Absolute: 0.3 10*3/uL (ref 0.0–0.5)
Eosinophils Relative: 3 %
HCT: 34.2 % — ABNORMAL LOW (ref 36.0–46.0)
Hemoglobin: 10.8 g/dL — ABNORMAL LOW (ref 12.0–15.0)
Immature Granulocytes: 1 %
Lymphocytes Relative: 34 %
Lymphs Abs: 2.9 10*3/uL (ref 0.7–4.0)
MCH: 28.3 pg (ref 26.0–34.0)
MCHC: 31.6 g/dL (ref 30.0–36.0)
MCV: 89.5 fL (ref 80.0–100.0)
Monocytes Absolute: 0.5 10*3/uL (ref 0.1–1.0)
Monocytes Relative: 6 %
Neutro Abs: 4.8 10*3/uL (ref 1.7–7.7)
Neutrophils Relative %: 55 %
Platelet Count: 203 10*3/uL (ref 150–400)
RBC: 3.82 MIL/uL — ABNORMAL LOW (ref 3.87–5.11)
RDW: 13.6 % (ref 11.5–15.5)
WBC Count: 8.7 10*3/uL (ref 4.0–10.5)
nRBC: 0 % (ref 0.0–0.2)

## 2021-08-20 MED ORDER — ANASTROZOLE 1 MG PO TABS: 1.0000 mg | ORAL_TABLET | Freq: Every day | ORAL | 4 refills | Status: DC

## 2021-08-21 ENCOUNTER — Telehealth: Payer: Self-pay | Admitting: Hematology and Oncology

## 2021-08-21 NOTE — Telephone Encounter (Signed)
Scheduled appointment per 6/27 los. Left message.

## 2021-08-29 ENCOUNTER — Inpatient Hospital Stay: Admission: RE | Admit: 2021-08-29 | Payer: Medicare Other | Source: Ambulatory Visit

## 2021-09-09 ENCOUNTER — Ambulatory Visit
Admission: RE | Admit: 2021-09-09 | Discharge: 2021-09-09 | Disposition: A | Discharge status: Home / Self Care | Payer: Medicare Other | Source: Ambulatory Visit | Attending: Family Medicine | Admitting: Family Medicine

## 2021-09-09 ENCOUNTER — Other Ambulatory Visit: Payer: Self-pay | Admitting: Family Medicine

## 2021-09-09 DIAGNOSIS — I10 Essential (primary) hypertension: Secondary | ICD-10-CM | POA: Diagnosis not present

## 2021-09-09 DIAGNOSIS — R059 Cough, unspecified: Secondary | ICD-10-CM | POA: Diagnosis not present

## 2021-10-03 ENCOUNTER — Other Ambulatory Visit: Payer: Self-pay | Admitting: Hematology and Oncology

## 2021-10-03 DIAGNOSIS — C50512 Malignant neoplasm of lower-outer quadrant of left female breast: Secondary | ICD-10-CM

## 2021-10-03 DIAGNOSIS — C50412 Malignant neoplasm of upper-outer quadrant of left female breast: Secondary | ICD-10-CM

## 2021-10-24 DIAGNOSIS — D690 Allergic purpura: Secondary | ICD-10-CM | POA: Diagnosis not present

## 2021-10-24 DIAGNOSIS — Z87442 Personal history of urinary calculi: Secondary | ICD-10-CM | POA: Diagnosis not present

## 2021-10-24 DIAGNOSIS — D649 Anemia, unspecified: Secondary | ICD-10-CM | POA: Diagnosis not present

## 2021-10-24 DIAGNOSIS — E7800 Pure hypercholesterolemia, unspecified: Secondary | ICD-10-CM | POA: Diagnosis not present

## 2021-10-24 DIAGNOSIS — R053 Chronic cough: Secondary | ICD-10-CM | POA: Diagnosis not present

## 2021-10-24 DIAGNOSIS — I129 Hypertensive chronic kidney disease with stage 1 through stage 4 chronic kidney disease, or unspecified chronic kidney disease: Secondary | ICD-10-CM | POA: Diagnosis not present

## 2021-10-24 DIAGNOSIS — N181 Chronic kidney disease, stage 1: Secondary | ICD-10-CM | POA: Diagnosis not present

## 2021-11-12 DIAGNOSIS — I10 Essential (primary) hypertension: Secondary | ICD-10-CM | POA: Diagnosis not present

## 2021-11-12 DIAGNOSIS — R7303 Prediabetes: Secondary | ICD-10-CM | POA: Diagnosis not present

## 2021-11-12 DIAGNOSIS — Z0000 Encounter for general adult medical examination without abnormal findings: Secondary | ICD-10-CM | POA: Diagnosis not present

## 2021-11-12 DIAGNOSIS — C50912 Malignant neoplasm of unspecified site of left female breast: Secondary | ICD-10-CM | POA: Diagnosis not present

## 2021-11-12 DIAGNOSIS — E7800 Pure hypercholesterolemia, unspecified: Secondary | ICD-10-CM | POA: Diagnosis not present

## 2021-11-12 DIAGNOSIS — Z23 Encounter for immunization: Secondary | ICD-10-CM | POA: Diagnosis not present

## 2021-12-27 DIAGNOSIS — I10 Essential (primary) hypertension: Secondary | ICD-10-CM | POA: Diagnosis not present

## 2022-03-21 ENCOUNTER — Telehealth: Payer: Self-pay | Admitting: *Deleted

## 2022-03-21 ENCOUNTER — Ambulatory Visit
Admission: RE | Admit: 2022-03-21 | Discharge: 2022-03-21 | Disposition: A | Discharge status: Home / Self Care | Payer: Medicare Other | Source: Ambulatory Visit | Attending: Hematology and Oncology | Admitting: Hematology and Oncology

## 2022-03-21 DIAGNOSIS — Z171 Estrogen receptor negative status [ER-]: Secondary | ICD-10-CM

## 2022-03-21 DIAGNOSIS — C50412 Malignant neoplasm of upper-outer quadrant of left female breast: Secondary | ICD-10-CM

## 2022-03-21 DIAGNOSIS — Z780 Asymptomatic menopausal state: Secondary | ICD-10-CM | POA: Diagnosis not present

## 2022-03-21 NOTE — Telephone Encounter (Signed)
-----  Message from Gardenia Phlegm, NP sent at 03/21/2022  2:03 PM EST ----- Normal bone density.  Please let patient know good news!  ----- Message ----- From: Interface, Rad Results In Sent: 03/21/2022   1:35 PM EST To: Nicholas Lose, MD

## 2022-03-21 NOTE — Telephone Encounter (Signed)
Per Annabelle Harman called pt with message below. Pt verbalized understanding

## 2022-04-08 DIAGNOSIS — N181 Chronic kidney disease, stage 1: Secondary | ICD-10-CM | POA: Diagnosis not present

## 2022-04-08 DIAGNOSIS — N390 Urinary tract infection, site not specified: Secondary | ICD-10-CM | POA: Diagnosis not present

## 2022-04-11 ENCOUNTER — Other Ambulatory Visit: Payer: Self-pay | Admitting: Hematology and Oncology

## 2022-04-11 DIAGNOSIS — R928 Other abnormal and inconclusive findings on diagnostic imaging of breast: Secondary | ICD-10-CM

## 2022-05-01 ENCOUNTER — Encounter: Payer: Self-pay | Admitting: Oncology

## 2022-05-06 ENCOUNTER — Ambulatory Visit: Payer: Medicare Other | Admitting: Podiatry

## 2022-05-08 ENCOUNTER — Ambulatory Visit: Payer: Medicare Other | Admitting: Podiatry

## 2022-05-08 ENCOUNTER — Ambulatory Visit (INDEPENDENT_AMBULATORY_CARE_PROVIDER_SITE_OTHER): Payer: Medicare Other

## 2022-05-08 ENCOUNTER — Encounter: Payer: Self-pay | Admitting: Podiatry

## 2022-05-08 DIAGNOSIS — M722 Plantar fascial fibromatosis: Secondary | ICD-10-CM | POA: Diagnosis not present

## 2022-05-08 MED ORDER — METHYLPREDNISOLONE 4 MG PO TBPK: ORAL_TABLET | ORAL | 0 refills | Status: AC

## 2022-05-08 MED ORDER — MELOXICAM 15 MG PO TABS: 15.0000 mg | ORAL_TABLET | Freq: Every day | ORAL | 3 refills | Status: AC

## 2022-05-08 MED ORDER — TRIAMCINOLONE ACETONIDE 40 MG/ML IJ SUSP
40.0000 mg | Freq: Once | INTRAMUSCULAR | Status: AC
  Administered 2022-05-08: 40 mg

## 2022-05-08 NOTE — Patient Instructions (Signed)

## 2022-05-08 NOTE — Progress Notes (Signed)
Subjective:  Patient ID: Emily Chambers, female    DOB: 1951/01/07,  MRN: [id number] HPI Chief Complaint  Patient presents with   Foot Pain    Plantar heel bilateral - aching x 8 months, AM pain, no treatment   New Patient (Initial Visit)    72 y.o. female presents with the above complaint.   ROS: Denies fever chills nausea vomit muscle aches pains calf pain back pain chest pain shortness of breath.  Past Medical History:  Diagnosis Date   Cancer [facility])    left breast cancer   Family history of breast cancer    Family history of lung cancer    Family history of throat cancer    High cholesterol    Hypertension    Kidney stone    Kidney stones    Personal history of chemotherapy    Personal history of radiation therapy    UTI (lower urinary tract infection)    Past Surgical History:  Procedure Laterality Date   BREAST CYST ASPIRATION Left 04/21/2019   seroma   BREAST EXCISIONAL BIOPSY Left 1999,2005   cysts removed   BREAST LUMPECTOMY Left 2020   BREAST LUMPECTOMY WITH RADIOACTIVE SEED AND SENTINEL LYMPH NODE BIOPSY Left 09/01/2018   Procedure: LEFT BREAST  RADIOACTIVE SEED LUMPECTOMY X2 AND LEFT SENTINEL LYMPH NODE BIOPSY AND MAPPING;  Surgeon: Stark Klein, MD;  Location: [place];  Service: General;  Laterality: Left;   BREAST SURGERY Left    cyst and biopsy   PORT-A-CATH REMOVAL Left 01/06/2019   Procedure: REMOVAL PORT-A-CATH;  Surgeon: Stark Klein, MD;  Location: [place];  Service: General;  Laterality: Left;   PORTACATH PLACEMENT Left 03/10/2018   Procedure: INSERTION PORT-A-CATH;  Surgeon: Stark Klein, MD;  Location: [place];  Service: General;  Laterality: Left;   RADIOACTIVE SEED GUIDED EXCISIONAL BREAST BIOPSY Right 09/01/2018   Procedure: RADIOACTIVE SEED GUIDED EXCISIONAL RIGHT BREAST BIOPSY;  Surgeon: Stark Klein, MD;  Location: [place];  Service: General;  Laterality: Right;     Current Outpatient Medications:    meloxicam (MOBIC) 15 MG tablet, Take 1 tablet (15 mg total) by mouth daily., Disp: 30 tablet, Rfl: 3   methylPREDNISolone (MEDROL DOSEPAK) 4 MG TBPK tablet, 6 day dose pack - take as directed, Disp: 21 tablet, Rfl: 0   amLODipine (NORVASC) 10 MG tablet, Take 10 mg by mouth every morning. , Disp: , Rfl:    anastrozole (ARIMIDEX) 1 MG tablet, Take 1 tablet (1 mg total) by mouth daily., Disp: 90 tablet, Rfl: 4   aspirin 81 MG chewable tablet, Chew 81 mg by mouth daily., Disp: , Rfl:    cholecalciferol (VITAMIN D3) 25 MCG (1000 UNIT) tablet, Take 1 tablet (1,000 Units total) by mouth daily., Disp: 90 tablet, Rfl: 4   fluticasone (FLONASE) 50 MCG/ACT nasal spray, Place 1-2 sprays into both nostrils daily., Disp: 16 g, Rfl: 0   hydrochlorothiazide (HYDRODIURIL) 25 MG tablet, Take 25 mg by mouth daily., Disp: , Rfl:    losartan (COZAAR) 100 MG tablet, Take 100 mg by mouth every morning. , Disp: , Rfl:    meclizine (ANTIVERT) 12.5 MG tablet, Take 1-2 tablets (12.5-25 mg total) by mouth 3 (three) times daily as needed for dizziness., Disp: 30 tablet, Rfl: 0   pravastatin (PRAVACHOL) 20 MG tablet, Take 20 mg by mouth every morning. , Disp: , Rfl:   Allergies  Allergen Reactions   Amlodipine     Other Reaction(s):  edema with '10mg'$    Lisinopril     Other Reaction(s): cough   Losartan     Other Reaction(s): cough   Review of Systems Objective:  There were no vitals filed for this visit.  General: Well developed, nourished, in no acute distress, alert and oriented x3   Dermatological: Skin is warm, dry and supple bilateral. Nails x 10 are well maintained; remaining integument appears unremarkable at this time. There are no open sores, no preulcerative lesions, no rash or signs of infection present.  Vascular: Dorsalis Pedis artery and Posterior Tibial artery pedal pulses are 2/4 bilateral with immedate capillary fill time. Pedal hair growth present. No  varicosities and no lower extremity edema present bilateral.   Neruologic: Grossly intact via light touch bilateral. Vibratory intact via tuning fork bilateral. Protective threshold with Semmes Wienstein monofilament intact to all pedal sites bilateral. Patellar and Achilles deep tendon reflexes 2+ bilateral. No Babinski or clonus noted bilateral.   Musculoskeletal: No gross boney pedal deformities bilateral. No pain, crepitus, or limitation noted with foot and ankle range of motion bilateral. Muscular strength 5/5 in all groups tested bilateral.  She has pain on palpation medial calcaneal tubercle bilateral heels.  Gait: Unassisted, Nonantalgic.    Radiographs:  Radiographs taken today demonstrate osseously mature individual soft tissue increase in density plantar fascial Caney insertion site small plantar distally oriented calcaneal spurs are noted.  Assessment & Plan:   Assessment: Planter fasciitis bilateral.  Plan: Injected bilateral heels today 20 mg Kenalog 5 mg Marcaine point of maximal tenderness.  Tolerated procedure well without complications.  Was given both: Number instructions.  I wrote a prescription for methylprednisolone to be followed by meloxicam.  Braces bilateral.  Discussed appropriate shoe gear and I will follow-up with her in 1 month     Emily Chambers T. [place], [place]

## 2022-05-21 ENCOUNTER — Ambulatory Visit
Admission: RE | Admit: 2022-05-21 | Discharge: 2022-05-21 | Disposition: A | Discharge status: Home / Self Care | Payer: Medicare Other | Source: Ambulatory Visit | Attending: Hematology and Oncology | Admitting: Hematology and Oncology

## 2022-05-21 DIAGNOSIS — Z853 Personal history of malignant neoplasm of breast: Secondary | ICD-10-CM | POA: Diagnosis not present

## 2022-05-21 DIAGNOSIS — R922 Inconclusive mammogram: Secondary | ICD-10-CM | POA: Diagnosis not present

## 2022-05-21 DIAGNOSIS — R928 Other abnormal and inconclusive findings on diagnostic imaging of breast: Secondary | ICD-10-CM

## 2022-06-11 DIAGNOSIS — H524 Presbyopia: Secondary | ICD-10-CM | POA: Diagnosis not present

## 2022-06-11 DIAGNOSIS — Z961 Presence of intraocular lens: Secondary | ICD-10-CM | POA: Diagnosis not present

## 2022-06-26 DIAGNOSIS — C50912 Malignant neoplasm of unspecified site of left female breast: Secondary | ICD-10-CM | POA: Diagnosis not present

## 2022-06-26 DIAGNOSIS — I10 Essential (primary) hypertension: Secondary | ICD-10-CM | POA: Diagnosis not present

## 2022-06-26 DIAGNOSIS — E7800 Pure hypercholesterolemia, unspecified: Secondary | ICD-10-CM | POA: Diagnosis not present

## 2022-06-26 DIAGNOSIS — R7303 Prediabetes: Secondary | ICD-10-CM | POA: Diagnosis not present

## 2022-07-09 ENCOUNTER — Other Ambulatory Visit: Payer: Self-pay | Admitting: Hematology and Oncology

## 2022-08-02 DIAGNOSIS — J40 Bronchitis, not specified as acute or chronic: Secondary | ICD-10-CM | POA: Diagnosis not present

## 2022-08-02 DIAGNOSIS — J329 Chronic sinusitis, unspecified: Secondary | ICD-10-CM | POA: Diagnosis not present

## 2022-08-25 ENCOUNTER — Inpatient Hospital Stay: Payer: Medicare Other | Attending: Hematology and Oncology | Admitting: Hematology and Oncology

## 2022-08-25 NOTE — Assessment & Plan Note (Deleted)
02/19/2018: Left Breast UOQ: T1c N0, stage IA invasive carcinoma, likely lobular, grade 2, estrogen and progesterone receptor positive, HER-2 not amplified, with an MIB-1 of 5% Left Breast LOQ:  clinical T1c N0, stage IA-B invasive ductal carcinoma, grade 2, estrogen receptor only moderately positive at 10%, progesterone receptor negative, with an MIB-1 of 40%, and HER-2 not amplified   03/16/2018, 07/27/2018 NAC with AC X 4 foll by Taxol-carbo X 12 09/01/2018: Left lumpectomies:    (a) invasive ductal carcinoma, grade 2, ypT1b ypN0, triple negative (b) invasive lobular carcinoma, grade 2, ypT1c ypN0, estrogen and progesterone receptor positive, HER-2 not amplified   10/04/2018 through 11/02/2018: XRT 12/06/2018: Anastrozole   Anastrozole Toxicities: Mild hot flashes Denies any joint aches and pains.   Breast Cancer Surveillance: 1. Breast Exam: 08/25/2022: Bening 2. Mammogram: 05/21/2022: Benign, density Cat C   RTC in 1 year

## 2022-10-13 DIAGNOSIS — N181 Chronic kidney disease, stage 1: Secondary | ICD-10-CM | POA: Diagnosis not present

## 2022-12-04 DIAGNOSIS — E7800 Pure hypercholesterolemia, unspecified: Secondary | ICD-10-CM | POA: Diagnosis not present

## 2022-12-04 DIAGNOSIS — N181 Chronic kidney disease, stage 1: Secondary | ICD-10-CM | POA: Diagnosis not present

## 2022-12-04 DIAGNOSIS — Z87442 Personal history of urinary calculi: Secondary | ICD-10-CM | POA: Diagnosis not present

## 2022-12-04 DIAGNOSIS — I129 Hypertensive chronic kidney disease with stage 1 through stage 4 chronic kidney disease, or unspecified chronic kidney disease: Secondary | ICD-10-CM | POA: Diagnosis not present

## 2022-12-04 DIAGNOSIS — D649 Anemia, unspecified: Secondary | ICD-10-CM | POA: Diagnosis not present

## 2022-12-04 DIAGNOSIS — D690 Allergic purpura: Secondary | ICD-10-CM | POA: Diagnosis not present

## 2022-12-04 DIAGNOSIS — R053 Chronic cough: Secondary | ICD-10-CM | POA: Diagnosis not present

## 2022-12-25 DIAGNOSIS — E7800 Pure hypercholesterolemia, unspecified: Secondary | ICD-10-CM | POA: Diagnosis not present

## 2022-12-25 DIAGNOSIS — I10 Essential (primary) hypertension: Secondary | ICD-10-CM | POA: Diagnosis not present

## 2022-12-25 DIAGNOSIS — Z0000 Encounter for general adult medical examination without abnormal findings: Secondary | ICD-10-CM | POA: Diagnosis not present

## 2023-01-21 DIAGNOSIS — I10 Essential (primary) hypertension: Secondary | ICD-10-CM | POA: Diagnosis not present

## 2023-01-21 DIAGNOSIS — N181 Chronic kidney disease, stage 1: Secondary | ICD-10-CM | POA: Diagnosis not present

## 2023-01-21 DIAGNOSIS — E559 Vitamin D deficiency, unspecified: Secondary | ICD-10-CM | POA: Diagnosis not present

## 2023-02-06 ENCOUNTER — Telehealth: Payer: Self-pay | Admitting: *Deleted

## 2023-02-06 NOTE — Telephone Encounter (Signed)
Received call from pt with complaint of left breast pain and swelling x several weeks.  Pt denies any redness or warmth to breast.  Pt also denies any recent injury or trauma.  Pt requesting office visit for breast exam and further recommendations.  Appt scheduled, pt verbalized understanding of appt details.

## 2023-02-13 ENCOUNTER — Inpatient Hospital Stay: Payer: Medicare Other | Attending: Adult Health | Admitting: Adult Health

## 2023-02-13 ENCOUNTER — Encounter: Payer: Self-pay | Admitting: Adult Health

## 2023-02-13 VITALS — BP 149/87 | HR 84 | Temp 97.6°F | Resp 18 | Ht 62.0 in | Wt 177.8 lb

## 2023-02-13 DIAGNOSIS — Z171 Estrogen receptor negative status [ER-]: Secondary | ICD-10-CM | POA: Diagnosis not present

## 2023-02-13 DIAGNOSIS — Z923 Personal history of irradiation: Secondary | ICD-10-CM | POA: Diagnosis not present

## 2023-02-13 DIAGNOSIS — Z79811 Long term (current) use of aromatase inhibitors: Secondary | ICD-10-CM | POA: Diagnosis not present

## 2023-02-13 DIAGNOSIS — Z170 Estrogen receptor positive status [ER+]: Secondary | ICD-10-CM | POA: Diagnosis not present

## 2023-02-13 DIAGNOSIS — Z9221 Personal history of antineoplastic chemotherapy: Secondary | ICD-10-CM | POA: Insufficient documentation

## 2023-02-13 DIAGNOSIS — C50412 Malignant neoplasm of upper-outer quadrant of left female breast: Secondary | ICD-10-CM | POA: Insufficient documentation

## 2023-02-13 DIAGNOSIS — C50512 Malignant neoplasm of lower-outer quadrant of left female breast: Secondary | ICD-10-CM | POA: Insufficient documentation

## 2023-02-13 MED ORDER — ANASTROZOLE 1 MG PO TABS: 1.0000 mg | ORAL_TABLET | Freq: Every day | ORAL | 4 refills | Status: AC

## 2023-02-13 NOTE — Progress Notes (Signed)
[facility] Cancer Follow up:    Debroah Loop, DO [address] [place] [address]   DIAGNOSIS:  Cancer Staging  Malignant neoplasm of lower-outer quadrant of left breast of female, estrogen receptor negative (HCC) Staging form: Breast, AJCC 8th Edition - Clinical stage from 03/03/2018: Stage IA (cT1c, cN0, cM0, G2, ER+, PR+, HER2-) - Unsigned Histologic grading system: 3 grade system Laterality: Left Stage used in treatment planning: Yes National guidelines used in treatment planning: Yes Type of national guideline used in treatment planning: NCCN - Pathologic stage from 09/01/2018: No Stage Recommended (ypT1c, pN0, cM0, G2, ER+, PR+, HER2-) - Unsigned Stage prefix: Post-therapy Histologic grading system: 3 grade system  Malignant neoplasm of upper-outer quadrant of left breast in female, estrogen receptor positive (HCC) Staging form: Breast, AJCC 8th Edition - Clinical stage from 03/03/2018: Stage IA (cT1c, cN0, cM0, G2, ER+, PR-, HER2-) - Unsigned Histologic grading system: 3 grade system Stage used in treatment planning: Yes National guidelines used in treatment planning: Yes Type of national guideline used in treatment planning: NCCN - Pathologic stage from 09/01/2018: No Stage Recommended (ypT1b, pN0, cM0, G2, ER+, PR-, HER2-) - Unsigned Stage prefix: Post-therapy Histologic grading system: 3 grade system   SUMMARY OF ONCOLOGIC HISTORY: Oncology History  Malignant neoplasm of lower-outer quadrant of left breast of female, estrogen receptor negative (HCC)  02/26/2018 Initial Diagnosis   Malignant neoplasm of lower-outer quadrant of left breast of female, estrogen receptor negative (HCC)   03/17/2018 - 07/27/2018 Chemotherapy   DOXOrubicin (ADRIAMYCIN) chemo injection 112 mg, 60 mg/m2 = 112 mg, Intravenous,  Once, 4 of 4 cycles. Administration: 112 mg (03/17/2018), 112 mg (03/30/2018), 112 mg (04/13/2018), 112 mg (04/27/2018)  palonosetron (ALOXI) injection  0.25 mg, 0.25 mg, Intravenous,  Once, 8 of 8 cycles. Administration: 0.25 mg (03/17/2018), 0.25 mg (05/11/2018), 0.25 mg (03/30/2018), 0.25 mg (04/13/2018), 0.25 mg (04/27/2018), 0.25 mg (06/01/2018), 0.25 mg (05/18/2018), 0.25 mg (05/25/2018), 0.25 mg (06/08/2018), 0.25 mg (06/15/2018), 0.25 mg (06/22/2018), 0.25 mg (06/29/2018), 0.25 mg (07/06/2018), 0.25 mg (07/13/2018), 0.25 mg (07/20/2018), 0.25 mg (07/27/2018)  pegfilgrastim-cbqv (UDENYCA) injection 6 mg, 6 mg, Subcutaneous, Once, 4 of 4 cycles Administration: 6 mg (03/19/2018), 6 mg (04/01/2018), 6 mg (04/15/2018), 6 mg (04/29/2018)  CARBOplatin (PARAPLATIN) 190 mg in sodium chloride 0.9 % 250 mL chemo infusion, 190 mg (100 % of original dose 187.2 mg), Intravenous,  Once, 4 of 4 cycles. Dose modification:   (original dose 187.2 mg, Cycle 5),   (original dose 187.2 mg, Cycle 5). Administration: 190 mg (05/11/2018), 190 mg (05/18/2018), 190 mg (05/25/2018), 190 mg (06/01/2018), 190 mg (06/08/2018), 190 mg (06/15/2018), 190 mg (06/22/2018), 190 mg (06/29/2018), 190 mg (07/06/2018), 190 mg (07/13/2018), 190 mg (07/20/2018)  cyclophosphamide (CYTOXAN) 1,120 mg in sodium chloride 0.9 % 250 mL chemo infusion, 600 mg/m2 = 1,120 mg, Intravenous,  Once, 4 of 4 cycles Administration: 1,120 mg (03/17/2018), 1,120 mg (03/30/2018), 1,120 mg (04/13/2018), 1,120 mg (04/27/2018)  gemcitabine (GEMZAR) 1,482 mg in sodium chloride 0.9 % 250 mL chemo infusion, 800 mg/m2 = 1,482 mg (100 % of original dose 800 mg/m2), Intravenous,  Once, 1 of 1 cycle Dose modification: 800 mg/m2 (original dose 800 mg/m2, Cycle 8, Reason: Provider Judgment) Administration: 1,482 mg (07/27/2018)  PACLitaxel (TAXOL) 150 mg in sodium chloride 0.9 % 250 mL chemo infusion (</= 80mg /m2), 80 mg/m2 = 150 mg, Intravenous,  Once, 4 of 4 cycles Administration: 150 mg (05/11/2018), 150 mg (05/18/2018), 150 mg (05/25/2018), 150 mg (06/01/2018), 150 mg (  06/08/2018), 150 mg (06/15/2018), 150 mg (06/22/2018), 150 mg (06/29/2018), 150 mg (07/06/2018), 150 mg  (07/13/2018), 150 mg (07/20/2018)  fosaprepitant (EMEND) 150 mg   dexamethasone (DECADRON) 12 mg in sodium chloride 0.9 % 145 mL IVPB, , Intravenous,  Once, 4 of 4 cycles Administration:  (03/17/2018),  (03/30/2018),  (04/13/2018),  (04/27/2018)    Malignant neoplasm of upper-outer quadrant of left breast in female, estrogen receptor positive (HCC)  02/26/2018 Initial Diagnosis   Malignant neoplasm of upper-outer quadrant of left breast in female, estrogen receptor positive (HCC)   03/17/2018 - 07/27/2018 Chemotherapy   DOXOrubicin (ADRIAMYCIN) chemo injection 112 mg, 60 mg/m2 = 112 mg, Intravenous,  Once, 4 of 4 cycles. Administration: 112 mg (03/17/2018), 112 mg (03/30/2018), 112 mg (04/13/2018), 112 mg (04/27/2018)  palonosetron (ALOXI) injection 0.25 mg, 0.25 mg, Intravenous,  Once, 8 of 8 cycles. Administration: 0.25 mg (03/17/2018), 0.25 mg (05/11/2018), 0.25 mg (03/30/2018), 0.25 mg (04/13/2018), 0.25 mg (04/27/2018), 0.25 mg (06/01/2018), 0.25 mg (05/18/2018), 0.25 mg (05/25/2018), 0.25 mg (06/08/2018), 0.25 mg (06/15/2018), 0.25 mg (06/22/2018), 0.25 mg (06/29/2018), 0.25 mg (07/06/2018), 0.25 mg (07/13/2018), 0.25 mg (07/20/2018), 0.25 mg (07/27/2018)  pegfilgrastim-cbqv (UDENYCA) injection 6 mg, 6 mg, Subcutaneous, Once, 4 of 4 cycles Administration: 6 mg (03/19/2018), 6 mg (04/01/2018), 6 mg (04/15/2018), 6 mg (04/29/2018)  CARBOplatin (PARAPLATIN) 190 mg in sodium chloride 0.9 % 250 mL chemo infusion, 190 mg (100 % of original dose 187.2 mg), Intravenous,  Once, 4 of 4 cycles. Dose modification:   (original dose 187.2 mg, Cycle 5),   (original dose 187.2 mg, Cycle 5). Administration: 190 mg (05/11/2018), 190 mg (05/18/2018), 190 mg (05/25/2018), 190 mg (06/01/2018), 190 mg (06/08/2018), 190 mg (06/15/2018), 190 mg (06/22/2018), 190 mg (06/29/2018), 190 mg (07/06/2018), 190 mg (07/13/2018), 190 mg (07/20/2018)  cyclophosphamide (CYTOXAN) 1,120 mg in sodium chloride 0.9 % 250 mL chemo infusion, 600 mg/m2 = 1,120 mg, Intravenous,  Once, 4 of 4  cycles Administration: 1,120 mg (03/17/2018), 1,120 mg (03/30/2018), 1,120 mg (04/13/2018), 1,120 mg (04/27/2018)  gemcitabine (GEMZAR) 1,482 mg in sodium chloride 0.9 % 250 mL chemo infusion, 800 mg/m2 = 1,482 mg (100 % of original dose 800 mg/m2), Intravenous,  Once, 1 of 1 cycle Dose modification: 800 mg/m2 (original dose 800 mg/m2, Cycle 8, Reason: Provider Judgment) Administration: 1,482 mg (07/27/2018)  PACLitaxel (TAXOL) 150 mg in sodium chloride 0.9 % 250 mL chemo infusion (</= 80mg /m2), 80 mg/m2 = 150 mg, Intravenous,  Once, 4 of 4 cycles Administration: 150 mg (05/11/2018), 150 mg (05/18/2018), 150 mg (05/25/2018), 150 mg (06/01/2018), 150 mg (06/08/2018), 150 mg (06/15/2018), 150 mg (06/22/2018), 150 mg (06/29/2018), 150 mg (07/06/2018), 150 mg (07/13/2018), 150 mg (07/20/2018)  fosaprepitant (EMEND) 150 mg   dexamethasone (DECADRON) 12 mg in sodium chloride 0.9 % 145 mL IVPB, , Intravenous,  Once, 4 of 4 cycles Administration:  (03/17/2018),  (03/30/2018),  (04/13/2018),  (04/27/2018)    03/24/2018 Genetic Testing   Genetic testing showed no pathogenic mutations.  Genes tested include:  APC, ATM, AXIN2, BARD1, BMPR1A, BRCA1, BRCA2, BRIP1, CDH1, CDKN2A (p14ARF), CDKN2A (p16INK4a), CKD4, CHEK2, CTNNA1, DICER1, EPCAM (Deletion/duplication testing only), GREM1 (promoter region deletion/duplication testing only), KIT, MEN1, MLH1, MSH2, MSH3, MSH6, MUTYH, NBN, NF1, NHTL1, PALB2, PDGFRA, PMS2, POLD1, POLE, PTEN, RAD50, RAD51C, RAD51D, SDHB, SDHC, SDHD, SMAD4, SMARCA4. STK11, TP53, TSC1, TSC2, and VHL.  The following genes were evaluated for sequence changes only: SDHA and HOXB13 c.251G>A variant only.   09/01/2018 Surgery   Right breast lumpectomy w/seed [facility]) [phone]) revealed no malignancy.  Left breast lumpectomy @ 2 o'clock revealed IDC, grade 2, spanning 0.9 cm and focal high grade DCIS. Negative margins. Triple negative.  Left breast lumpectomy @ 3:30 revealed invasive lobular carcinoma, grade 2, spanning  1.4 cm, <0.1 cm of the anterior margin multifocally. ER positive, PR positive, HER-2 negative.  0/2 left axillary lymph nodes were positive for carcinoma.   10/04/2018 - 11/02/2018 Radiation Therapy   The patient initially received a dose of 40.05 Gy in 15 fractions to the breast using whole-breast tangent fields. This was delivered using a 3-D conformal technique. The pt received a boost delivering an additional 12 Gy in 2 fractions using a electron boost with electrons. The total dose was 52.05 Gy.   11/2018 - 11/2023 Anti-estrogen oral therapy   Anastrozole     CURRENT THERAPY: anastrozole  INTERVAL HISTORY:  Discussed the use of AI scribe software for clinical note transcription with the patient, who gave verbal consent to proceed.  Emily Chambers 72 y.o. female with a history of left breast lumpectomies and radiation therapy in 2020, presents with intermittent left breast pain that started a couple of weeks ago. The pain is described as an ache and is diffusely located throughout the entire left breast. The patient also reports an itching sensation in the same area.  The patient has been off anastrozole since the summer due to running out of the medication. She has lost weight recently by eating less. The patient's most recent mammogram in March was negative, and she had a normal bone density.   Patient Active Problem List   Diagnosis Date Noted   Genetic testing 03/24/2018   Port-A-Cath in place 03/17/2018   Family history of breast cancer    Family history of throat cancer    Family history of lung cancer    Malignant neoplasm of lower-outer quadrant of left breast of female, estrogen receptor negative (HCC) 02/26/2018   Malignant neoplasm of upper-outer quadrant of left breast in female, estrogen receptor positive (HCC) 02/26/2018   Essential hypertension 10/23/2014   Hyperlipidemia 10/23/2014    is allergic to amlodipine, lisinopril, and losartan.  MEDICAL  HISTORY: Past Medical History:  Diagnosis Date   Cancer [facility])    left breast cancer   Family history of breast cancer    Family history of lung cancer    Family history of throat cancer    High cholesterol    Hypertension    Kidney stone    Kidney stones    Personal history of chemotherapy    Personal history of radiation therapy    UTI (lower urinary tract infection)     SURGICAL HISTORY: Past Surgical History:  Procedure Laterality Date   BREAST CYST ASPIRATION Left 04/21/2019   seroma   BREAST EXCISIONAL BIOPSY Left 1999,2005   cysts removed   BREAST LUMPECTOMY Left 2020   BREAST LUMPECTOMY WITH RADIOACTIVE SEED AND SENTINEL LYMPH NODE BIOPSY Left 09/01/2018   Procedure: LEFT BREAST  RADIOACTIVE SEED LUMPECTOMY X2 AND LEFT SENTINEL LYMPH NODE BIOPSY AND MAPPING;  Surgeon: Almond Lint, MD;  Location: [facility];  Service: General;  Laterality: Left;   BREAST SURGERY Left    cyst and biopsy   PORT-A-CATH REMOVAL Left 01/06/2019   Procedure: REMOVAL PORT-A-CATH;  Surgeon: Almond Lint, MD;  Location: [facility];  Service: General;  Laterality: Left;   PORTACATH PLACEMENT Left 03/10/2018   Procedure: INSERTION PORT-A-CATH;  Surgeon: Almond Lint, MD;  Location: [facility];  Service: General;  Laterality: Left;   RADIOACTIVE SEED GUIDED EXCISIONAL BREAST BIOPSY Right 09/01/2018   Procedure: RADIOACTIVE SEED GUIDED EXCISIONAL RIGHT BREAST BIOPSY;  Surgeon: Almond Lint, MD;  Location: [facility];  Service: General;  Laterality: Right;    SOCIAL HISTORY: Social History   Socioeconomic History   Marital status: Single    Spouse name: Not on file   Number of children: 0   Years of education: 14   Highest education level: Not on file  Occupational History   Occupation: Laborer  Tobacco Use   Smoking status: Never   Smokeless tobacco: Never  Substance and Sexual Activity   Alcohol use: Yes    Comment: seldom    Drug use: No   Sexual activity: Not on file  Other Topics Concern   Not on file  Social History Narrative   Fun: park, walking, music   Denies religious beliefs effecting health care.    Feels safe at home and denies abuse. Lives alone.   Social Drivers of Corporate investment banker Strain: Not on file  Food Insecurity: Not on file  Transportation Needs: Not on file  Physical Activity: Not on file  Stress: Not on file  Social Connections: Not on file  Intimate Partner Violence: Not on file    FAMILY HISTORY: Family History  Problem Relation Age of Onset   Hypertension Mother    Heart disease Mother    Dementia Mother    Healthy Father    Colon cancer Maternal Grandmother    Cancer Paternal Grandmother        unk type   Lung cancer Cousin        76s   Breast cancer Cousin        27s   Breast cancer Cousin        89s   Breast cancer Cousin    Cancer Paternal Aunt        unk type d. 71, possibly pancreatic   Cancer Paternal Aunt        unk type d. 18s   Cancer Paternal Uncle        unk type    Cancer Paternal Uncle        unk type d. 15s   Cancer Maternal Aunt        unk type, d. 30s   Throat cancer Maternal Uncle        d. 75s   Ovarian cancer Neg Hx     Review of Systems  Constitutional:  Negative for appetite change, chills, fatigue, fever and unexpected weight change.  HENT:   Negative for hearing loss, lump/mass and trouble swallowing.   Eyes:  Negative for eye problems and icterus.  Respiratory:  Negative for chest tightness, cough and shortness of breath.   Cardiovascular:  Negative for chest pain, leg swelling and palpitations.  Gastrointestinal:  Negative for abdominal distention, abdominal pain, constipation, diarrhea, nausea and vomiting.  Endocrine: Negative for hot flashes.  Genitourinary:  Negative for difficulty urinating.   Musculoskeletal:  Negative for arthralgias.  Skin:  Negative for itching and rash.  Neurological:  Negative for  dizziness, extremity weakness, headaches and numbness.  Hematological:  Negative for adenopathy. Does not bruise/bleed easily.  Psychiatric/Behavioral:  Negative for depression. The patient is not nervous/anxious.       PHYSICAL EXAMINATION    Vitals:   02/13/23 1206  BP: (!) 149/87  Pulse: 84  Resp: 18  Temp: 97.6 F (36.4 C)  SpO2: 97%    Physical Exam Constitutional:      General: She is not in acute distress.    Appearance: Normal appearance. She is not toxic-appearing.  HENT:     Head: Normocephalic and atraumatic.     Mouth/Throat:     Mouth: Mucous membranes are moist.     Pharynx: Oropharynx is clear. No oropharyngeal exudate or posterior oropharyngeal erythema.  Eyes:     General: No scleral icterus. Cardiovascular:     Rate and Rhythm: Normal rate and regular rhythm.     Pulses: Normal pulses.     Heart sounds: Normal heart sounds.  Pulmonary:     Effort: Pulmonary effort is normal.     Breath sounds: Normal breath sounds.  Chest:     Comments: Left breast with no nodularity or sign of local recurrence, + focal tenderness in left upper outer breast approximate to prior lumpectomy sites, right breast benign Abdominal:     General: Abdomen is flat. Bowel sounds are normal. There is no distension.     Palpations: Abdomen is soft.     Tenderness: There is no abdominal tenderness.  Musculoskeletal:        General: No swelling.     Cervical back: Neck supple.  Lymphadenopathy:     Cervical: No cervical adenopathy.     Upper Body:     Right upper body: No axillary adenopathy.     Left upper body: No axillary adenopathy.  Skin:    General: Skin is warm and dry.     Findings: No rash.  Neurological:     General: No focal deficit present.     Mental Status: She is alert.  Psychiatric:        Mood and Affect: Mood normal.        Behavior: Behavior normal.       ASSESSMENT and THERAPY PLAN:   Malignant neoplasm of upper-outer quadrant of left breast  in female, estrogen receptor positive (HCC) 02/19/2018: Left Breast UOQ: T1c N0, stage IA invasive carcinoma, likely lobular, grade 2, estrogen and progesterone receptor positive, HER-2 not amplified, with an MIB-1 of 5% Left Breast LOQ:  clinical T1c N0, stage IA-B invasive ductal carcinoma, grade 2, estrogen receptor only moderately positive at 10%, progesterone receptor negative, with an MIB-1 of 40%, and HER-2 not amplified  03/16/2018, 07/27/2018 NAC with AC X 4 foll by Taxol-carbo X 12 09/01/2018: Left lumpectomies:    (a) invasive ductal carcinoma, grade 2, ypT1b ypN0, triple negative (b) invasive lobular carcinoma, grade 2, ypT1c ypN0, estrogen and progesterone receptor positive, HER-2 not amplified  10/04/2018 through 11/02/2018: XRT 12/06/2018: Anastrozole  Left Breast Pain New onset of pain in the left breast over the past couple of weeks. The patient has a history of lumpectomies and radiation in the same breast. On examination, no concerning lumps were palpated. The pain could be due to fluid changes or nerve damage from radiation. -Order mammogram and ultrasound of the left breast. -If symptoms persist or worsen, even with negative imaging, patient to return for further evaluation.  Anastrozole Noncompliance The patient has not been taking anastrozole since the summer due to running out of medication. Anastrozole is important for the management of her breast cancer. -Refill anastrozole prescription and advise patient to resume taking it.  Follow-up The patient is due for a follow-up visit in six months, or sooner if the breast pain continues or new symptoms develop. -Schedule follow-up appointment in six months.  All questions were  answered. The patient knows to call the clinic with any problems, questions or concerns. We can certainly see the patient much sooner if necessary.  Total encounter time:20 minutes*in face-to-face visit time, chart review, lab review, care coordination,  order entry, and documentation of the encounter time.    Lillard Anes, NP 02/13/23 1:49 PM Medical Oncology and Hematology [facility] [address] [place], [place] [address] Tel. [phone]    Fax. [phone]  *Total Encounter Time as defined by the Centers for Medicare and Medicaid Services includes, in addition to the face-to-face time of a patient visit (documented in the note above) non-face-to-face time: obtaining and reviewing outside history, ordering and reviewing medications, tests or procedures, care coordination (communications with other health care professionals or caregivers) and documentation in the medical record.

## 2023-02-13 NOTE — Assessment & Plan Note (Signed)
02/19/2018: Left Breast UOQ: T1c N0, stage IA invasive carcinoma, likely lobular, grade 2, estrogen and progesterone receptor positive, HER-2 not amplified, with an MIB-1 of 5% Left Breast LOQ:  clinical T1c N0, stage IA-B invasive ductal carcinoma, grade 2, estrogen receptor only moderately positive at 10%, progesterone receptor negative, with an MIB-1 of 40%, and HER-2 not amplified  03/16/2018, 07/27/2018 NAC with AC X 4 foll by Taxol-carbo X 12 09/01/2018: Left lumpectomies:    (a) invasive ductal carcinoma, grade 2, ypT1b ypN0, triple negative (b) invasive lobular carcinoma, grade 2, ypT1c ypN0, estrogen and progesterone receptor positive, HER-2 not amplified  10/04/2018 through 11/02/2018: XRT 12/06/2018: Anastrozole  Left Breast Pain New onset of pain in the left breast over the past couple of weeks. The patient has a history of lumpectomies and radiation in the same breast. On examination, no concerning lumps were palpated. The pain could be due to fluid changes or nerve damage from radiation. -Order mammogram and ultrasound of the left breast. -If symptoms persist or worsen, even with negative imaging, patient to return for further evaluation.  Anastrozole Noncompliance The patient has not been taking anastrozole since the summer due to running out of medication. Anastrozole is important for the management of her breast cancer. -Refill anastrozole prescription and advise patient to resume taking it.  Follow-up The patient is due for a follow-up visit in six months, or sooner if the breast pain continues or new symptoms develop. -Schedule follow-up appointment in six months.

## 2023-03-23 ENCOUNTER — Ambulatory Visit
Admission: RE | Admit: 2023-03-23 | Discharge: 2023-03-23 | Disposition: A | Discharge status: Home / Self Care | Payer: Medicare Other | Source: Ambulatory Visit | Attending: Adult Health | Admitting: Adult Health

## 2023-03-23 ENCOUNTER — Ambulatory Visit: Payer: Medicare Other

## 2023-03-23 DIAGNOSIS — Z170 Estrogen receptor positive status [ER+]: Secondary | ICD-10-CM

## 2023-03-23 DIAGNOSIS — Z171 Estrogen receptor negative status [ER-]: Secondary | ICD-10-CM

## 2023-06-17 ENCOUNTER — Other Ambulatory Visit: Payer: Self-pay | Admitting: Family Medicine

## 2023-06-17 DIAGNOSIS — Z1231 Encounter for screening mammogram for malignant neoplasm of breast: Secondary | ICD-10-CM

## 2023-06-25 ENCOUNTER — Ambulatory Visit

## 2023-06-25 DIAGNOSIS — E782 Mixed hyperlipidemia: Secondary | ICD-10-CM | POA: Diagnosis not present

## 2023-06-25 DIAGNOSIS — C50912 Malignant neoplasm of unspecified site of left female breast: Secondary | ICD-10-CM | POA: Diagnosis not present

## 2023-06-25 DIAGNOSIS — I38 Endocarditis, valve unspecified: Secondary | ICD-10-CM | POA: Diagnosis not present

## 2023-06-25 DIAGNOSIS — I10 Essential (primary) hypertension: Secondary | ICD-10-CM | POA: Diagnosis not present

## 2023-06-25 DIAGNOSIS — R7303 Prediabetes: Secondary | ICD-10-CM | POA: Diagnosis not present

## 2023-06-26 ENCOUNTER — Ambulatory Visit
Admission: RE | Admit: 2023-06-26 | Discharge: 2023-06-26 | Disposition: A | Discharge status: Home / Self Care | Source: Ambulatory Visit | Attending: Family Medicine | Admitting: Family Medicine

## 2023-06-26 DIAGNOSIS — Z1231 Encounter for screening mammogram for malignant neoplasm of breast: Secondary | ICD-10-CM | POA: Diagnosis not present

## 2023-07-25 DIAGNOSIS — E7800 Pure hypercholesterolemia, unspecified: Secondary | ICD-10-CM | POA: Diagnosis not present

## 2023-07-25 DIAGNOSIS — I10 Essential (primary) hypertension: Secondary | ICD-10-CM | POA: Diagnosis not present

## 2023-07-25 DIAGNOSIS — C50912 Malignant neoplasm of unspecified site of left female breast: Secondary | ICD-10-CM | POA: Diagnosis not present

## 2023-08-17 ENCOUNTER — Ambulatory Visit: Payer: Medicare Other | Admitting: Hematology and Oncology

## 2023-08-23 NOTE — Assessment & Plan Note (Signed)
 02/19/2018: Left Breast UOQ: T1c N0, stage IA invasive carcinoma, likely lobular, grade 2, estrogen and progesterone receptor positive, HER-2 not amplified, with an MIB-1 of 5% Left Breast LOQ:  clinical T1c N0, stage IA-B invasive ductal carcinoma, grade 2, estrogen receptor only moderately positive at 10%, progesterone receptor negative, with an MIB-1 of 40%, and HER-2 not amplified   03/16/2018, 07/27/2018 NAC with AC X 4 foll by Taxol -carbo X 12 09/01/2018: Left lumpectomies:    (a) invasive ductal carcinoma, grade 2, ypT1b ypN0, triple negative (b) invasive lobular carcinoma, grade 2, ypT1c ypN0, estrogen and progesterone receptor positive, HER-2 not amplified   10/04/2018 through 11/02/2018: XRT 12/06/2018: Anastrozole    Anastrozole  Toxicities: Mild hot flashes Denies any joint aches and pains.   Breast Cancer Surveillance: 1. Breast Exam: 08/24/23: Bening 2. Mammogram: 06/30/23: Benign, density Cat C   RTC in 1 year

## 2023-08-24 ENCOUNTER — Inpatient Hospital Stay: Attending: Hematology and Oncology | Admitting: Hematology and Oncology

## 2023-08-24 DIAGNOSIS — Z171 Estrogen receptor negative status [ER-]: Secondary | ICD-10-CM | POA: Diagnosis not present

## 2023-08-24 DIAGNOSIS — C50512 Malignant neoplasm of lower-outer quadrant of left female breast: Secondary | ICD-10-CM | POA: Insufficient documentation

## 2023-08-24 DIAGNOSIS — Z79811 Long term (current) use of aromatase inhibitors: Secondary | ICD-10-CM | POA: Insufficient documentation

## 2023-08-24 DIAGNOSIS — Z170 Estrogen receptor positive status [ER+]: Secondary | ICD-10-CM | POA: Insufficient documentation

## 2023-08-24 DIAGNOSIS — C50912 Malignant neoplasm of unspecified site of left female breast: Secondary | ICD-10-CM | POA: Diagnosis not present

## 2023-08-24 DIAGNOSIS — Z923 Personal history of irradiation: Secondary | ICD-10-CM | POA: Insufficient documentation

## 2023-08-24 DIAGNOSIS — Z780 Asymptomatic menopausal state: Secondary | ICD-10-CM | POA: Diagnosis not present

## 2023-08-24 DIAGNOSIS — E7800 Pure hypercholesterolemia, unspecified: Secondary | ICD-10-CM | POA: Diagnosis not present

## 2023-08-24 DIAGNOSIS — I10 Essential (primary) hypertension: Secondary | ICD-10-CM | POA: Diagnosis not present

## 2023-08-24 NOTE — Progress Notes (Signed)
 Patient Care Team: Auston Opal, DO as PCP - General (Family Medicine) Aron Shoulders, MD as Consulting Physician (General Surgery) Shannon Agent, MD as Consulting Physician (Radiation Oncology) Burnette Fallow, MD as Consulting Physician (Gastroenterology)  DIAGNOSIS:  Encounter Diagnoses  Name Primary?   Malignant neoplasm of lower-outer quadrant of left breast of female, estrogen receptor negative (HCC) Yes   Post-menopausal     SUMMARY OF ONCOLOGIC HISTORY: Oncology History  Malignant neoplasm of lower-outer quadrant of left breast of female, estrogen receptor negative (HCC)  02/26/2018 Initial Diagnosis   Malignant neoplasm of lower-outer quadrant of left breast of female, estrogen receptor negative (HCC)   03/17/2018 - 07/27/2018 Chemotherapy   DOXOrubicin  (ADRIAMYCIN ) chemo injection 112 mg, 60 mg/m2 = 112 mg, Intravenous,  Once, 4 of 4 cycles. Administration: 112 mg (03/17/2018), 112 mg (03/30/2018), 112 mg (04/13/2018), 112 mg (04/27/2018)  palonosetron  (ALOXI ) injection 0.25 mg, 0.25 mg, Intravenous,  Once, 8 of 8 cycles. Administration: 0.25 mg (03/17/2018), 0.25 mg (05/11/2018), 0.25 mg (03/30/2018), 0.25 mg (04/13/2018), 0.25 mg (04/27/2018), 0.25 mg (06/01/2018), 0.25 mg (05/18/2018), 0.25 mg (05/25/2018), 0.25 mg (06/08/2018), 0.25 mg (06/15/2018), 0.25 mg (06/22/2018), 0.25 mg (06/29/2018), 0.25 mg (07/06/2018), 0.25 mg (07/13/2018), 0.25 mg (07/20/2018), 0.25 mg (07/27/2018)  pegfilgrastim -cbqv (UDENYCA ) injection 6 mg, 6 mg, Subcutaneous, Once, 4 of 4 cycles Administration: 6 mg (03/19/2018), 6 mg (04/01/2018), 6 mg (04/15/2018), 6 mg (04/29/2018)  CARBOplatin  (PARAPLATIN ) 190 mg in sodium chloride  0.9 % 250 mL chemo infusion, 190 mg (100 % of original dose 187.2 mg), Intravenous,  Once, 4 of 4 cycles. Dose modification:   (original dose 187.2 mg, Cycle 5),   (original dose 187.2 mg, Cycle 5). Administration: 190 mg (05/11/2018), 190 mg (05/18/2018), 190 mg (05/25/2018), 190 mg (06/01/2018), 190 mg (06/08/2018),  190 mg (06/15/2018), 190 mg (06/22/2018), 190 mg (06/29/2018), 190 mg (07/06/2018), 190 mg (07/13/2018), 190 mg (07/20/2018)  cyclophosphamide  (CYTOXAN ) 1,120 mg in sodium chloride  0.9 % 250 mL chemo infusion, 600 mg/m2 = 1,120 mg, Intravenous,  Once, 4 of 4 cycles Administration: 1,120 mg (03/17/2018), 1,120 mg (03/30/2018), 1,120 mg (04/13/2018), 1,120 mg (04/27/2018)  gemcitabine  (GEMZAR ) 1,482 mg in sodium chloride  0.9 % 250 mL chemo infusion, 800 mg/m2 = 1,482 mg (100 % of original dose 800 mg/m2), Intravenous,  Once, 1 of 1 cycle Dose modification: 800 mg/m2 (original dose 800 mg/m2, Cycle 8, Reason: Provider Judgment) Administration: 1,482 mg (07/27/2018)  PACLitaxel  (TAXOL ) 150 mg in sodium chloride  0.9 % 250 mL chemo infusion (</= 80mg /m2), 80 mg/m2 = 150 mg, Intravenous,  Once, 4 of 4 cycles Administration: 150 mg (05/11/2018), 150 mg (05/18/2018), 150 mg (05/25/2018), 150 mg (06/01/2018), 150 mg (06/08/2018), 150 mg (06/15/2018), 150 mg (06/22/2018), 150 mg (06/29/2018), 150 mg (07/06/2018), 150 mg (07/13/2018), 150 mg (07/20/2018)  fosaprepitant  (EMEND) 150 mg   dexamethasone  (DECADRON ) 12 mg in sodium chloride  0.9 % 145 mL IVPB, , Intravenous,  Once, 4 of 4 cycles Administration:  (03/17/2018),  (03/30/2018),  (04/13/2018),  (04/27/2018)    Malignant neoplasm of upper-outer quadrant of left breast in female, estrogen receptor positive (HCC)  02/26/2018 Initial Diagnosis   Malignant neoplasm of upper-outer quadrant of left breast in female, estrogen receptor positive (HCC)   03/17/2018 - 07/27/2018 Chemotherapy   DOXOrubicin  (ADRIAMYCIN ) chemo injection 112 mg, 60 mg/m2 = 112 mg, Intravenous,  Once, 4 of 4 cycles. Administration: 112 mg (03/17/2018), 112 mg (03/30/2018), 112 mg (04/13/2018), 112 mg (04/27/2018)  palonosetron  (ALOXI ) injection 0.25 mg, 0.25 mg, Intravenous,  Once, 8 of 8 cycles.  Administration: 0.25 mg (03/17/2018), 0.25 mg (05/11/2018), 0.25 mg (03/30/2018), 0.25 mg (04/13/2018), 0.25 mg (04/27/2018), 0.25 mg  (06/01/2018), 0.25 mg (05/18/2018), 0.25 mg (05/25/2018), 0.25 mg (06/08/2018), 0.25 mg (06/15/2018), 0.25 mg (06/22/2018), 0.25 mg (06/29/2018), 0.25 mg (07/06/2018), 0.25 mg (07/13/2018), 0.25 mg (07/20/2018), 0.25 mg (07/27/2018)  pegfilgrastim -cbqv (UDENYCA ) injection 6 mg, 6 mg, Subcutaneous, Once, 4 of 4 cycles Administration: 6 mg (03/19/2018), 6 mg (04/01/2018), 6 mg (04/15/2018), 6 mg (04/29/2018)  CARBOplatin  (PARAPLATIN ) 190 mg in sodium chloride  0.9 % 250 mL chemo infusion, 190 mg (100 % of original dose 187.2 mg), Intravenous,  Once, 4 of 4 cycles. Dose modification:   (original dose 187.2 mg, Cycle 5),   (original dose 187.2 mg, Cycle 5). Administration: 190 mg (05/11/2018), 190 mg (05/18/2018), 190 mg (05/25/2018), 190 mg (06/01/2018), 190 mg (06/08/2018), 190 mg (06/15/2018), 190 mg (06/22/2018), 190 mg (06/29/2018), 190 mg (07/06/2018), 190 mg (07/13/2018), 190 mg (07/20/2018)  cyclophosphamide  (CYTOXAN ) 1,120 mg in sodium chloride  0.9 % 250 mL chemo infusion, 600 mg/m2 = 1,120 mg, Intravenous,  Once, 4 of 4 cycles Administration: 1,120 mg (03/17/2018), 1,120 mg (03/30/2018), 1,120 mg (04/13/2018), 1,120 mg (04/27/2018)  gemcitabine  (GEMZAR ) 1,482 mg in sodium chloride  0.9 % 250 mL chemo infusion, 800 mg/m2 = 1,482 mg (100 % of original dose 800 mg/m2), Intravenous,  Once, 1 of 1 cycle Dose modification: 800 mg/m2 (original dose 800 mg/m2, Cycle 8, Reason: Provider Judgment) Administration: 1,482 mg (07/27/2018)  PACLitaxel  (TAXOL ) 150 mg in sodium chloride  0.9 % 250 mL chemo infusion (</= 80mg /m2), 80 mg/m2 = 150 mg, Intravenous,  Once, 4 of 4 cycles Administration: 150 mg (05/11/2018), 150 mg (05/18/2018), 150 mg (05/25/2018), 150 mg (06/01/2018), 150 mg (06/08/2018), 150 mg (06/15/2018), 150 mg (06/22/2018), 150 mg (06/29/2018), 150 mg (07/06/2018), 150 mg (07/13/2018), 150 mg (07/20/2018)  fosaprepitant  (EMEND) 150 mg   dexamethasone  (DECADRON ) 12 mg in sodium chloride  0.9 % 145 mL IVPB, , Intravenous,  Once, 4 of 4  cycles Administration:  (03/17/2018),  (03/30/2018),  (04/13/2018),  (04/27/2018)    03/24/2018 Genetic Testing   Genetic testing showed no pathogenic mutations.  Genes tested include:  APC, ATM, AXIN2, BARD1, BMPR1A, BRCA1, BRCA2, BRIP1, CDH1, CDKN2A (p14ARF), CDKN2A (p16INK4a), CKD4, CHEK2, CTNNA1, DICER1, EPCAM (Deletion/duplication testing only), GREM1 (promoter region deletion/duplication testing only), KIT, MEN1, MLH1, MSH2, MSH3, MSH6, MUTYH, NBN, NF1, NHTL1, PALB2, PDGFRA, PMS2, POLD1, POLE, PTEN, RAD50, RAD51C, RAD51D, SDHB, SDHC, SDHD, SMAD4, SMARCA4. STK11, TP53, TSC1, TSC2, and VHL.  The following genes were evaluated for sequence changes only: SDHA and HOXB13 c.251G>A variant only.   09/01/2018 Surgery   Right breast lumpectomy w/seed Floretta) [phone]) revealed no malignancy.  Left breast lumpectomy @ 2 o'clock revealed IDC, grade 2, spanning 0.9 cm and focal high grade DCIS. Negative margins. Triple negative.  Left breast lumpectomy @ 3:30 revealed invasive lobular carcinoma, grade 2, spanning 1.4 cm, <0.1 cm of the anterior margin multifocally. ER positive, PR positive, HER-2 negative.  0/2 left axillary lymph nodes were positive for carcinoma.   10/04/2018 - 11/02/2018 Radiation Therapy   The patient initially received a dose of 40.05 Gy in 15 fractions to the breast using whole-breast tangent fields. This was delivered using a 3-D conformal technique. The pt received a boost delivering an additional 12 Gy in 2 fractions using a electron boost with electrons. The total dose was 52.05 Gy.   11/2018 - 11/2023 Anti-estrogen oral therapy   Anastrozole      CHIEF COMPLIANT:   HISTORY OF PRESENT ILLNESS:  Discussed the use of AI scribe software for clinical note transcription with the patient, who gave verbal consent to proceed.  History of Present Illness  January 2024:Emily Chambers is a 73 year old female who presents for routine follow-up after completing five years of  anastrozole  therapy.  She feels well overall with no pain or discomfort. Her recent mammogram on May 6th was clear, as was the previous mammogram in January on the left side. She has completed five years of anastrozole  therapy.     ALLERGIES:  is allergic to amlodipine, lisinopril, and losartan.  MEDICATIONS:  Current Outpatient Medications  Medication Sig Dispense Refill   anastrozole  (ARIMIDEX ) 1 MG tablet Take 1 tablet (1 mg total) by mouth daily. 90 tablet 4   aspirin 81 MG chewable tablet Chew 81 mg by mouth daily.     fluticasone  (FLONASE ) 50 MCG/ACT nasal spray Place 1-2 sprays into both nostrils daily. (Patient taking differently: Place 1-2 sprays into both nostrils daily. Prn) 16 g 0   pravastatin (PRAVACHOL) 20 MG tablet Take 20 mg by mouth every morning.      amLODipine (NORVASC) 10 MG tablet Take 10 mg by mouth every morning.  (Patient not taking: Reported on 08/24/2023)     cholecalciferol (VITAMIN D3) 25 MCG (1000 UNIT) tablet Take 1 tablet (1,000 Units total) by mouth daily. (Patient not taking: Reported on 08/24/2023) 90 tablet 4   hydrochlorothiazide (HYDRODIURIL) 25 MG tablet Take 25 mg by mouth daily.     losartan (COZAAR) 100 MG tablet Take 100 mg by mouth every morning.      meclizine  (ANTIVERT ) 12.5 MG tablet Take 1-2 tablets (12.5-25 mg total) by mouth 3 (three) times daily as needed for dizziness. (Patient not taking: Reported on 08/24/2023) 30 tablet 0   meloxicam  (MOBIC ) 15 MG tablet Take 1 tablet (15 mg total) by mouth daily. 30 tablet 3   methylPREDNISolone  (MEDROL  DOSEPAK) 4 MG TBPK tablet 6 day dose pack - take as directed 21 tablet 0   No current facility-administered medications for this visit.    PHYSICAL EXAMINATION: ECOG PERFORMANCE STATUS: 1 - Symptomatic but completely ambulatory  There were no vitals filed for this visit. There were no vitals filed for this visit.  Physical Exam No palpable lumps or nodules in bilateral breasts or axilla  (exam  performed in the presence of a chaperone)  LABORATORY DATA:  I have reviewed the data as listed    Latest Ref Rng & Units 08/20/2021    2:52 PM 09/26/2020    3:52 PM 07/20/2020    1:52 PM  CMP  Glucose 70 - 99 mg/dL 83  827  899   BUN 8 - 23 mg/dL 17  15  12    Creatinine 0.44 - 1.00 mg/dL 9.11  9.17  9.36   Sodium 135 - 145 mmol/L 141  143  141   Potassium 3.5 - 5.1 mmol/L 3.9  3.3  4.0   Chloride 98 - 111 mmol/L 108  109  102   CO2 22 - 32 mmol/L 26  23  23    Calcium 8.9 - 10.3 mg/dL 89.7  89.5  89.1   Total Protein 6.5 - 8.1 g/dL 7.3  7.6    Total Bilirubin 0.3 - 1.2 mg/dL 0.3  0.3    Alkaline Phos 38 - 126 U/L 93  111    AST 15 - 41 U/L 19  16    ALT 0 - 44 U/L 27  22  Lab Results  Component Value Date   WBC 8.7 08/20/2021   HGB 10.8 (L) 08/20/2021   HCT 34.2 (L) 08/20/2021   MCV 89.5 08/20/2021   PLT 203 08/20/2021   NEUTROABS 4.8 08/20/2021    ASSESSMENT & PLAN:  Malignant neoplasm of lower-outer quadrant of left breast of female, estrogen receptor negative (HCC) 02/19/2018: Left Breast UOQ: T1c N0, stage IA invasive carcinoma, likely lobular, grade 2, estrogen and progesterone receptor positive, HER-2 not amplified, with an MIB-1 of 5% Left Breast LOQ:  clinical T1c N0, stage IA-B invasive ductal carcinoma, grade 2, estrogen receptor only moderately positive at 10%, progesterone receptor negative, with an MIB-1 of 40%, and HER-2 not amplified   03/16/2018, 07/27/2018 NAC with AC X 4 foll by Taxol -carbo X 12 09/01/2018: Left lumpectomies:    (a) invasive ductal carcinoma, grade 2, ypT1b ypN0, triple negative (b) invasive lobular carcinoma, grade 2, ypT1c ypN0, estrogen and progesterone receptor positive, HER-2 not amplified   10/04/2018 through 11/02/2018: XRT 12/06/2018: Anastrozole    Anastrozole  Toxicities: Mild hot flashes Denies any joint aches and pains.   Breast Cancer Surveillance: 1. Breast Exam: 08/24/23: Bening 2. Mammogram: 06/30/23: Benign, density Cat  C I ordered a new bone density to be done in February 2025.  Previous bone density normal T-score -0.2   RTC in 1 year   Orders Placed This Encounter  Procedures   DG Bone Density    Standing Status:   Future    Expected Date:   03/29/2024    Expiration Date:   08/23/2024    Reason for Exam (SYMPTOM  OR DIAGNOSIS REQUIRED):   Post menopausal    Preferred imaging location?:   [facility]    Release to patient:   Immediate   The patient has a good understanding of the overall plan. she agrees with it. she will call with any problems that may develop before the next visit here. Total time spent: 30 mins including face to face time and time spent for planning, charting and co-ordination of care   Viinay K Joao Mccurdy, MD 08/24/23

## 2023-09-24 DIAGNOSIS — E7800 Pure hypercholesterolemia, unspecified: Secondary | ICD-10-CM | POA: Diagnosis not present

## 2023-09-24 DIAGNOSIS — C50912 Malignant neoplasm of unspecified site of left female breast: Secondary | ICD-10-CM | POA: Diagnosis not present

## 2023-09-24 DIAGNOSIS — I10 Essential (primary) hypertension: Secondary | ICD-10-CM | POA: Diagnosis not present

## 2023-10-05 DIAGNOSIS — N181 Chronic kidney disease, stage 1: Secondary | ICD-10-CM | POA: Diagnosis not present

## 2023-10-06 DIAGNOSIS — N181 Chronic kidney disease, stage 1: Secondary | ICD-10-CM | POA: Diagnosis not present

## 2023-10-09 DIAGNOSIS — I10 Essential (primary) hypertension: Secondary | ICD-10-CM | POA: Diagnosis not present

## 2023-10-17 DIAGNOSIS — I700 Atherosclerosis of aorta: Secondary | ICD-10-CM | POA: Diagnosis not present

## 2023-10-17 DIAGNOSIS — R059 Cough, unspecified: Secondary | ICD-10-CM | POA: Diagnosis not present

## 2023-10-25 DIAGNOSIS — E7800 Pure hypercholesterolemia, unspecified: Secondary | ICD-10-CM | POA: Diagnosis not present

## 2023-10-25 DIAGNOSIS — I10 Essential (primary) hypertension: Secondary | ICD-10-CM | POA: Diagnosis not present

## 2023-10-25 DIAGNOSIS — C50912 Malignant neoplasm of unspecified site of left female breast: Secondary | ICD-10-CM | POA: Diagnosis not present

## 2023-11-04 DIAGNOSIS — R059 Cough, unspecified: Secondary | ICD-10-CM | POA: Diagnosis not present

## 2023-11-04 DIAGNOSIS — I10 Essential (primary) hypertension: Secondary | ICD-10-CM | POA: Diagnosis not present

## 2023-11-24 DIAGNOSIS — E7800 Pure hypercholesterolemia, unspecified: Secondary | ICD-10-CM | POA: Diagnosis not present

## 2023-11-24 DIAGNOSIS — I10 Essential (primary) hypertension: Secondary | ICD-10-CM | POA: Diagnosis not present

## 2023-11-24 DIAGNOSIS — C50912 Malignant neoplasm of unspecified site of left female breast: Secondary | ICD-10-CM | POA: Diagnosis not present

## 2023-12-21 DIAGNOSIS — Z87442 Personal history of urinary calculi: Secondary | ICD-10-CM | POA: Diagnosis not present

## 2023-12-21 DIAGNOSIS — R053 Chronic cough: Secondary | ICD-10-CM | POA: Diagnosis not present

## 2023-12-21 DIAGNOSIS — N181 Chronic kidney disease, stage 1: Secondary | ICD-10-CM | POA: Diagnosis not present

## 2023-12-21 DIAGNOSIS — E7800 Pure hypercholesterolemia, unspecified: Secondary | ICD-10-CM | POA: Diagnosis not present

## 2023-12-21 DIAGNOSIS — I129 Hypertensive chronic kidney disease with stage 1 through stage 4 chronic kidney disease, or unspecified chronic kidney disease: Secondary | ICD-10-CM | POA: Diagnosis not present

## 2023-12-21 DIAGNOSIS — Z23 Encounter for immunization: Secondary | ICD-10-CM | POA: Diagnosis not present

## 2023-12-21 DIAGNOSIS — D649 Anemia, unspecified: Secondary | ICD-10-CM | POA: Diagnosis not present

## 2023-12-21 DIAGNOSIS — D690 Allergic purpura: Secondary | ICD-10-CM | POA: Diagnosis not present

## 2024-08-23 ENCOUNTER — Ambulatory Visit: Admitting: Hematology and Oncology
